# Patient Record
Sex: Male | Born: 1945
Health system: Southern US, Community
[De-identification: ages and names within clinical notes are randomized; demographics above are authoritative.]

## PROBLEM LIST (undated history)

## (undated) DIAGNOSIS — H409 Unspecified glaucoma: Secondary | ICD-10-CM

## (undated) DIAGNOSIS — G4730 Sleep apnea, unspecified: Secondary | ICD-10-CM

## (undated) DIAGNOSIS — J45909 Unspecified asthma, uncomplicated: Secondary | ICD-10-CM

## (undated) DIAGNOSIS — J449 Chronic obstructive pulmonary disease, unspecified: Secondary | ICD-10-CM

## (undated) DIAGNOSIS — J432 Centrilobular emphysema: Secondary | ICD-10-CM

## (undated) DIAGNOSIS — E785 Hyperlipidemia, unspecified: Secondary | ICD-10-CM

## (undated) DIAGNOSIS — K219 Gastro-esophageal reflux disease without esophagitis: Secondary | ICD-10-CM

## (undated) DIAGNOSIS — I10 Essential (primary) hypertension: Secondary | ICD-10-CM

## (undated) DIAGNOSIS — I2510 Atherosclerotic heart disease of native coronary artery without angina pectoris: Secondary | ICD-10-CM

## (undated) DIAGNOSIS — I519 Heart disease, unspecified: Secondary | ICD-10-CM

## (undated) DIAGNOSIS — G2581 Restless legs syndrome: Secondary | ICD-10-CM

## (undated) DIAGNOSIS — I7090 Unspecified atherosclerosis: Secondary | ICD-10-CM

## (undated) DIAGNOSIS — I219 Acute myocardial infarction, unspecified: Secondary | ICD-10-CM

## (undated) HISTORY — DX: Heart disease, unspecified: I51.9

## (undated) HISTORY — DX: Unspecified glaucoma: H40.9

## (undated) HISTORY — DX: Centrilobular emphysema: J43.2

## (undated) HISTORY — DX: Acute myocardial infarction, unspecified: I21.9

## (undated) HISTORY — DX: Unspecified atherosclerosis: I70.90

## (undated) HISTORY — PX: EYE SURGERY: SHX253

## (undated) HISTORY — PX: CATARACT EXTRACTION W/ INTRAOCULAR LENS  IMPLANT, BILATERAL: SHX1307

## (undated) HISTORY — DX: Unspecified asthma, uncomplicated: J45.909

## (undated) HISTORY — DX: Sleep apnea, unspecified: G47.30

## (undated) HISTORY — DX: Chronic obstructive pulmonary disease, unspecified: J44.9

## (undated) HISTORY — DX: Essential (primary) hypertension: I10

## (undated) HISTORY — DX: Hyperlipidemia, unspecified: E78.5

## (undated) HISTORY — DX: Restless legs syndrome: G25.81

## (undated) HISTORY — DX: Gastro-esophageal reflux disease without esophagitis: K21.9

---

## 2005-09-25 ENCOUNTER — Ambulatory Visit: Payer: Self-pay | Admitting: Ophthalmology

## 2005-09-25 ENCOUNTER — Other Ambulatory Visit: Payer: Self-pay

## 2006-02-12 ENCOUNTER — Ambulatory Visit: Payer: Self-pay | Admitting: Ophthalmology

## 2012-02-25 ENCOUNTER — Ambulatory Visit: Payer: Self-pay | Admitting: Ophthalmology

## 2014-10-24 DIAGNOSIS — J432 Centrilobular emphysema: Secondary | ICD-10-CM

## 2014-10-24 DIAGNOSIS — G2581 Restless legs syndrome: Secondary | ICD-10-CM

## 2014-10-24 DIAGNOSIS — K219 Gastro-esophageal reflux disease without esophagitis: Secondary | ICD-10-CM | POA: Insufficient documentation

## 2014-10-24 DIAGNOSIS — G4733 Obstructive sleep apnea (adult) (pediatric): Secondary | ICD-10-CM | POA: Insufficient documentation

## 2014-10-24 DIAGNOSIS — I7090 Unspecified atherosclerosis: Secondary | ICD-10-CM

## 2014-10-24 HISTORY — DX: Unspecified atherosclerosis: I70.90

## 2014-10-24 HISTORY — DX: Restless legs syndrome: G25.81

## 2014-10-24 HISTORY — DX: Centrilobular emphysema: J43.2

## 2015-01-14 ENCOUNTER — Other Ambulatory Visit
Admit: 2015-01-14 | Disposition: A | Discharge status: Home / Self Care | Payer: Self-pay | Attending: Family Medicine | Admitting: Family Medicine

## 2015-01-14 LAB — PROTIME-INR
INR: 0.9
Prothrombin Time: 12.7 secs

## 2015-10-24 DIAGNOSIS — Z79899 Other long term (current) drug therapy: Secondary | ICD-10-CM | POA: Diagnosis not present

## 2015-10-24 DIAGNOSIS — Z125 Encounter for screening for malignant neoplasm of prostate: Secondary | ICD-10-CM | POA: Diagnosis not present

## 2015-10-24 DIAGNOSIS — Z0000 Encounter for general adult medical examination without abnormal findings: Secondary | ICD-10-CM | POA: Diagnosis not present

## 2015-10-27 ENCOUNTER — Encounter: Payer: Self-pay | Admitting: Internal Medicine

## 2015-10-27 DIAGNOSIS — Z0000 Encounter for general adult medical examination without abnormal findings: Secondary | ICD-10-CM | POA: Diagnosis not present

## 2015-10-27 DIAGNOSIS — J431 Panlobular emphysema: Secondary | ICD-10-CM | POA: Diagnosis not present

## 2015-10-27 DIAGNOSIS — R319 Hematuria, unspecified: Secondary | ICD-10-CM | POA: Diagnosis not present

## 2015-11-06 ENCOUNTER — Ambulatory Visit (INDEPENDENT_AMBULATORY_CARE_PROVIDER_SITE_OTHER): Payer: PPO | Admitting: Urology

## 2015-11-06 ENCOUNTER — Encounter: Payer: Self-pay | Admitting: Urology

## 2015-11-06 VITALS — BP 190/91 | HR 94 | Ht 67.0 in | Wt 186.3 lb

## 2015-11-06 DIAGNOSIS — R3129 Other microscopic hematuria: Secondary | ICD-10-CM | POA: Diagnosis not present

## 2015-11-06 DIAGNOSIS — R972 Elevated prostate specific antigen [PSA]: Secondary | ICD-10-CM

## 2015-11-06 DIAGNOSIS — J449 Chronic obstructive pulmonary disease, unspecified: Secondary | ICD-10-CM | POA: Insufficient documentation

## 2015-11-06 HISTORY — DX: Chronic obstructive pulmonary disease, unspecified: J44.9

## 2015-11-06 LAB — MICROSCOPIC EXAMINATION: WBC, UA: NONE SEEN /hpf (ref 0–?)

## 2015-11-06 LAB — URINALYSIS, COMPLETE
Bilirubin, UA: NEGATIVE
Glucose, UA: NEGATIVE
KETONES UA: NEGATIVE
Leukocytes, UA: NEGATIVE
NITRITE UA: NEGATIVE
SPEC GRAV UA: 1.025 (ref 1.005–1.030)
Urobilinogen, Ur: 1 mg/dL (ref 0.2–1.0)
pH, UA: 5.5 (ref 5.0–7.5)

## 2015-11-06 NOTE — Progress Notes (Signed)
11/06/2015 11:35 AM   Nydia Bouton 1946/10/01 [id number]  Referring provider: No referring provider defined for this encounter.  Chief Complaint  Patient presents with  . Hematuria    New Patient    HPI:  70 year old white male referred by Dr. Emily Filbert, M.D. For evaluation and management of an elevated PSA.  in addition to the patient's elevated PSA, he is noted to have microscopic hematuria.     Over the past 6 months the patient denies any progression of his voiding symptoms. He denies any dysuria or gross hematuria.   The patient does not get up at night to void. He does have a history of a bladder neck incision based on his history although he have the notes from this. This was approximately 10 years ago and performed at [facility]. Since then the patient has had no issues with his voiding symptoms. His initial chief complaint 10 years ago was microscopic hematuria. The patient has no family history of prostate cancer.   The patient's past medical history is significant for severe CAD, he has collateral circulation but his lesions were  2 tight to be stented.   At this point, the patient has not been referred to a cardiac surgeon for bypass. He had a heart attack in 1994. He has not had any heart attacks since then. He is on Coumadin. The patient also has severe COPD. 6 years ago, he was placed on home oxygen , but at this point only wears it at night and as needed. He gets around quite well on his own.   PSA:   10/24/14 - 3.28  10/24/15-5.56  PMH: Past Medical History  Diagnosis Date  . GERD (gastroesophageal reflux disease)   . Asthma   . Glaucoma   . Heart attack ([place])   . Heart disease   . Hypertension   . Hyperlipemia   . Sleep apnea   . Arterial vascular disease 10/24/2014    Overview:  MI 1994, small-vessel disease requiring coumadin   . Centrilobular emphysema ([place]) 10/24/2014    Overview:  2L O2 prn   . Chronic obstructive pulmonary disease ([place])  11/06/2015  . Restless leg 10/24/2014    Surgical History: Past Surgical History  Procedure Laterality Date  . Cataract extraction w/ intraocular lens  implant, bilateral      Home Medications:    Medication List       This list is accurate as of: 11/06/15 11:35 AM.  Always use your most recent med list.               ADVAIR DISKUS 250-50 MCG/DOSE Aepb  Generic drug:  Fluticasone-Salmeterol  Inhale into the lungs.     atenolol 50 MG tablet  Commonly known as:  TENORMIN  TAKE 1 TABLET (50 MG TOTAL) BY MOUTH ONCE DAILY.     atorvastatin 80 MG tablet  Commonly known as:  LIPITOR  TAKE 1 TABLET (80 MG TOTAL) BY MOUTH ONCE DAILY.     fluticasone 50 MCG/ACT nasal spray  Commonly known as:  FLONASE  1 spray by Each Nare route daily.     gabapentin 300 MG capsule  Commonly known as:  NEURONTIN  Take 300 mg by mouth.     methocarbamol 500 MG tablet  Commonly known as:  ROBAXIN  Take 500 mg by mouth 3 (three) times daily as needed.     omeprazole 40 MG capsule  Commonly known as:  PRILOSEC  TAKE 1 CAPSULE (  40 MG TOTAL) BY MOUTH 2 (TWO) TIMES DAILY.     PROAIR HFA 108 (90 Base) MCG/ACT inhaler  Generic drug:  albuterol  Inhale into the lungs.     tiotropium 18 MCG inhalation capsule  Commonly known as:  [place] into inhaler and inhale.     TRAVATAN Z 0.004 % Soln ophthalmic solution  Generic drug:  Travoprost (BAK Free)  Frequency:PHARMDIR   Dosage:0.0     Instructions:  Note:1 gtt in each eye q pm Dose: 1     warfarin 5 MG tablet  Commonly known as:  COUMADIN  TAKE 5 MG TABLET EVERY OTHER DAY ALTERNATING WITH 2.5 MG        Allergies: No Known Allergies  Family History: Family History  Problem Relation Age of Onset  . Prostate cancer Neg Hx   . Bladder Cancer Neg Hx   . Kidney cancer Neg Hx   . Nephrolithiasis Father     Social History:  reports that he has quit smoking. He does not have any smokeless tobacco history on file. He reports that he  does not drink alcohol or use illicit drugs.  ROS: UROLOGY Frequent Urination?: No Hard to postpone urination?: No Burning/pain with urination?: No Get up at night to urinate?: Yes Leakage of urine?: No Urine stream starts and stops?: No Trouble starting stream?: No Do you have to strain to urinate?: No Blood in urine?: Yes Urinary tract infection?: No Sexually transmitted disease?: No Injury to kidneys or bladder?: No Painful intercourse?: No Weak stream?: No Erection problems?: No Penile pain?: No  Gastrointestinal Nausea?: No Vomiting?: No Indigestion/heartburn?: No Diarrhea?: Yes Constipation?: No  Constitutional Fever: No Night sweats?: No Weight loss?: No Fatigue?: Yes  Skin Skin rash/lesions?: No Itching?: No  Eyes Blurred vision?: No Double vision?: No  Ears/Nose/Throat Sore throat?: No Sinus problems?: Yes  Hematologic/Lymphatic Swollen glands?: No Easy bruising?: Yes  Cardiovascular Leg swelling?: No Chest pain?: No  Respiratory Cough?: Yes Shortness of breath?: Yes  Endocrine Excessive thirst?: No  Musculoskeletal Back pain?: Yes Joint pain?: No  Neurological Headaches?: No Dizziness?: No  Psychologic Depression?: No Anxiety?: No  Physical Exam: BP 190/91 mmHg  Pulse 94  Ht 5\' 7"  (1.702 m)  Wt 186 lb 4.8 oz (84.505 kg)  BMI 29.17 kg/m2  Constitutional:  Alert and oriented, No acute distress. HEENT: [place] AT, moist mucus membranes.  Trachea midline, no masses. Cardiovascular: No clubbing, cyanosis, or edema. Respiratory: Normal respiratory effort, no increased work of breathing. GI: Abdomen is soft, nontender, nondistended, no abdominal masses GU:  DRE normal, symmetric, no nodules, plus 2 in size Skin: No rashes, bruises or suspicious lesions. Lymph: No cervical or inguinal adenopathy. Neurologic: Grossly intact, no focal deficits, moving all 4 extremities. Psychiatric: Normal mood and affect.  Laboratory Data: No  results found for: WBC, HGB, HCT, MCV, PLT  No results found for: CREATININE  No results found for: PSA  No results found for: TESTOSTERONE  No results found for: HGBA1C  Urinalysis No results found for: COLORURINE, APPEARANCEUR, LABSPEC, PHURINE, GLUCOSEU, HGBUR, BILIRUBINUR, KETONESUR, PROTEINUR, UROBILINOGEN, NITRITE, LEUKOCYTESUR  Pertinent Imaging:   Assessment & Plan:   The patient has an elevated PSA.   He also has significant comorbidities with severe CAD and COPD. I went over the patient's PSA with him and explained the significance in great detail. I told him that he was much more likely to die from his other comorbidities stent from prostate cancer. However,  We should continue  to trend his PSA. We will plan to repeat it in 3 months. The patient's microscopic hematuria is chronic. He is otherwise asymptomatic.  Plan to repeat the patient's UA at his follow-up visit and discuss whether a complete evaluation is something that we will she is to pursue. Given his history of chronic microscopic hematuria and is history of bladder neck incision/ urethral dilation , I suspect his microscopic hematuria is of no clinical significance.  1. Microscopic hematuria  - Urinalysis, Complete - PSA, total and free   Return in about 3 months (around 02/04/2016) for with PSA prior to appointment.  Ardis Hughs, [facility] [address] [place], [place] [address] [phone]

## 2015-11-11 DIAGNOSIS — E669 Obesity, unspecified: Secondary | ICD-10-CM | POA: Diagnosis not present

## 2015-11-11 DIAGNOSIS — I2119 ST elevation (STEMI) myocardial infarction involving other coronary artery of inferior wall: Secondary | ICD-10-CM | POA: Diagnosis not present

## 2015-11-11 DIAGNOSIS — R079 Chest pain, unspecified: Secondary | ICD-10-CM | POA: Diagnosis not present

## 2015-11-11 DIAGNOSIS — I7789 Other specified disorders of arteries and arterioles: Secondary | ICD-10-CM | POA: Diagnosis not present

## 2015-11-11 DIAGNOSIS — Z7901 Long term (current) use of anticoagulants: Secondary | ICD-10-CM | POA: Diagnosis not present

## 2015-11-11 DIAGNOSIS — Z6828 Body mass index (BMI) 28.0-28.9, adult: Secondary | ICD-10-CM | POA: Diagnosis not present

## 2015-11-11 DIAGNOSIS — K219 Gastro-esophageal reflux disease without esophagitis: Secondary | ICD-10-CM | POA: Diagnosis not present

## 2015-11-11 DIAGNOSIS — I2510 Atherosclerotic heart disease of native coronary artery without angina pectoris: Secondary | ICD-10-CM | POA: Diagnosis not present

## 2015-11-11 DIAGNOSIS — I213 ST elevation (STEMI) myocardial infarction of unspecified site: Secondary | ICD-10-CM | POA: Diagnosis not present

## 2015-11-11 DIAGNOSIS — I2541 Coronary artery aneurysm: Secondary | ICD-10-CM | POA: Diagnosis not present

## 2015-11-11 DIAGNOSIS — E782 Mixed hyperlipidemia: Secondary | ICD-10-CM | POA: Diagnosis not present

## 2015-11-11 DIAGNOSIS — I10 Essential (primary) hypertension: Secondary | ICD-10-CM | POA: Diagnosis not present

## 2015-11-11 DIAGNOSIS — G4733 Obstructive sleep apnea (adult) (pediatric): Secondary | ICD-10-CM | POA: Diagnosis not present

## 2015-11-11 DIAGNOSIS — I252 Old myocardial infarction: Secondary | ICD-10-CM | POA: Diagnosis not present

## 2015-11-11 DIAGNOSIS — I2111 ST elevation (STEMI) myocardial infarction involving right coronary artery: Secondary | ICD-10-CM | POA: Diagnosis not present

## 2015-11-11 DIAGNOSIS — Z87891 Personal history of nicotine dependence: Secondary | ICD-10-CM | POA: Diagnosis not present

## 2015-11-11 DIAGNOSIS — J449 Chronic obstructive pulmonary disease, unspecified: Secondary | ICD-10-CM | POA: Diagnosis not present

## 2015-11-12 DIAGNOSIS — I7789 Other specified disorders of arteries and arterioles: Secondary | ICD-10-CM | POA: Diagnosis not present

## 2015-11-12 DIAGNOSIS — I2510 Atherosclerotic heart disease of native coronary artery without angina pectoris: Secondary | ICD-10-CM | POA: Diagnosis not present

## 2015-11-12 DIAGNOSIS — I2119 ST elevation (STEMI) myocardial infarction involving other coronary artery of inferior wall: Secondary | ICD-10-CM | POA: Diagnosis not present

## 2015-11-12 DIAGNOSIS — I2111 ST elevation (STEMI) myocardial infarction involving right coronary artery: Secondary | ICD-10-CM | POA: Diagnosis not present

## 2015-11-13 DIAGNOSIS — E782 Mixed hyperlipidemia: Secondary | ICD-10-CM | POA: Diagnosis not present

## 2015-11-13 DIAGNOSIS — I7789 Other specified disorders of arteries and arterioles: Secondary | ICD-10-CM | POA: Diagnosis not present

## 2015-11-13 DIAGNOSIS — I2119 ST elevation (STEMI) myocardial infarction involving other coronary artery of inferior wall: Secondary | ICD-10-CM | POA: Diagnosis not present

## 2015-11-14 DIAGNOSIS — I7789 Other specified disorders of arteries and arterioles: Secondary | ICD-10-CM | POA: Diagnosis not present

## 2015-11-14 DIAGNOSIS — I2119 ST elevation (STEMI) myocardial infarction involving other coronary artery of inferior wall: Secondary | ICD-10-CM | POA: Diagnosis not present

## 2015-11-14 DIAGNOSIS — E782 Mixed hyperlipidemia: Secondary | ICD-10-CM | POA: Diagnosis not present

## 2015-11-20 DIAGNOSIS — Z9989 Dependence on other enabling machines and devices: Secondary | ICD-10-CM | POA: Diagnosis not present

## 2015-11-20 DIAGNOSIS — G4733 Obstructive sleep apnea (adult) (pediatric): Secondary | ICD-10-CM | POA: Diagnosis not present

## 2015-11-20 DIAGNOSIS — I252 Old myocardial infarction: Secondary | ICD-10-CM | POA: Diagnosis not present

## 2015-11-20 DIAGNOSIS — I2119 ST elevation (STEMI) myocardial infarction involving other coronary artery of inferior wall: Secondary | ICD-10-CM | POA: Diagnosis not present

## 2015-11-20 DIAGNOSIS — I2510 Atherosclerotic heart disease of native coronary artery without angina pectoris: Secondary | ICD-10-CM | POA: Diagnosis not present

## 2015-11-20 DIAGNOSIS — J431 Panlobular emphysema: Secondary | ICD-10-CM | POA: Diagnosis not present

## 2015-11-21 DIAGNOSIS — I2119 ST elevation (STEMI) myocardial infarction involving other coronary artery of inferior wall: Secondary | ICD-10-CM | POA: Diagnosis not present

## 2015-11-22 DIAGNOSIS — F419 Anxiety disorder, unspecified: Secondary | ICD-10-CM | POA: Diagnosis not present

## 2015-12-26 DIAGNOSIS — J40 Bronchitis, not specified as acute or chronic: Secondary | ICD-10-CM | POA: Diagnosis not present

## 2016-02-02 DIAGNOSIS — J40 Bronchitis, not specified as acute or chronic: Secondary | ICD-10-CM | POA: Diagnosis not present

## 2016-02-05 ENCOUNTER — Other Ambulatory Visit: Payer: PPO

## 2016-02-05 DIAGNOSIS — R972 Elevated prostate specific antigen [PSA]: Secondary | ICD-10-CM

## 2016-02-06 LAB — PSA: PROSTATE SPECIFIC AG, SERUM: 3.6 ng/mL (ref 0.0–4.0)

## 2016-02-08 ENCOUNTER — Telehealth: Payer: Self-pay | Admitting: Radiology

## 2016-02-08 NOTE — Telephone Encounter (Signed)
Pt's wife called about pt's PSA level drawn 02/05/16. Advised her that level was 3.6 which is WNL. Pt doesn't have a f/u appt scheduled. Would you like an appt to be scheduled? If so, when?

## 2016-02-20 ENCOUNTER — Encounter: Payer: Self-pay | Admitting: Urology

## 2016-02-20 ENCOUNTER — Ambulatory Visit (INDEPENDENT_AMBULATORY_CARE_PROVIDER_SITE_OTHER): Payer: PPO | Admitting: Urology

## 2016-02-20 VITALS — BP 105/65 | HR 61 | Ht 66.0 in | Wt 183.6 lb

## 2016-02-20 DIAGNOSIS — R3129 Other microscopic hematuria: Secondary | ICD-10-CM | POA: Diagnosis not present

## 2016-02-20 DIAGNOSIS — I2510 Atherosclerotic heart disease of native coronary artery without angina pectoris: Secondary | ICD-10-CM | POA: Diagnosis not present

## 2016-02-20 DIAGNOSIS — J432 Centrilobular emphysema: Secondary | ICD-10-CM | POA: Diagnosis not present

## 2016-02-20 DIAGNOSIS — Z9989 Dependence on other enabling machines and devices: Secondary | ICD-10-CM | POA: Diagnosis not present

## 2016-02-20 DIAGNOSIS — I7789 Other specified disorders of arteries and arterioles: Secondary | ICD-10-CM | POA: Diagnosis not present

## 2016-02-20 DIAGNOSIS — G4733 Obstructive sleep apnea (adult) (pediatric): Secondary | ICD-10-CM | POA: Diagnosis not present

## 2016-02-20 DIAGNOSIS — I2119 ST elevation (STEMI) myocardial infarction involving other coronary artery of inferior wall: Secondary | ICD-10-CM | POA: Diagnosis not present

## 2016-02-20 LAB — URINALYSIS, COMPLETE
BILIRUBIN UA: NEGATIVE
GLUCOSE, UA: NEGATIVE
Ketones, UA: NEGATIVE
LEUKOCYTES UA: NEGATIVE
Nitrite, UA: NEGATIVE
PH UA: 7 (ref 5.0–7.5)
Specific Gravity, UA: 1.02 (ref 1.005–1.030)
UUROB: 0.2 mg/dL (ref 0.2–1.0)

## 2016-02-20 LAB — MICROSCOPIC EXAMINATION

## 2016-02-20 NOTE — Progress Notes (Signed)
70 year old male seen today in follow-up for an elevated PSA and microscopic hematuria. When the patient was last seen in January 2017 his PSA was 5.56. At that point we opted to repeat the PSA in 3 months. Since that time the patient has had a severe heart attack and undergone coronary artery stenting. Since the patient was last seen he denies any progression of his voiding symptoms. He denies any dysuria or gross hematuria. The patient's PSA today is 3.6, back to his baseline of between 3.2 and 3.5.  The patient continues to have microscopic hematuria. He is a former smoker, 25-pack-year history, quit was 20 years ago. He has no occupational exposure to carcinogenic agents. He has no family history of GU malignancies. His no history of kidney stones or recurrent urinary tract infections.  Current Outpatient Prescriptions on File Prior to Visit  Medication Sig Dispense Refill  . albuterol (PROAIR HFA) 108 (90 Base) MCG/ACT inhaler Inhale into the lungs.    Marland Kitchen atenolol (TENORMIN) 50 MG tablet TAKE 1 TABLET (50 MG TOTAL) BY MOUTH ONCE DAILY.  1  . atorvastatin (LIPITOR) 80 MG tablet TAKE 1 TABLET (80 MG TOTAL) BY MOUTH ONCE DAILY.  1  . fluticasone (FLONASE) 50 MCG/ACT nasal spray 1 spray by Each Nare route daily.    . Fluticasone-Salmeterol (ADVAIR DISKUS) 250-50 MCG/DOSE AEPB Inhale into the lungs.    . gabapentin (NEURONTIN) 300 MG capsule Take 300 mg by mouth.    . methocarbamol (ROBAXIN) 500 MG tablet Take 500 mg by mouth 3 (three) times daily as needed.  1  . omeprazole (PRILOSEC) 40 MG capsule TAKE 1 CAPSULE (40 MG TOTAL) BY MOUTH 2 (TWO) TIMES DAILY.  3  . tiotropium (SPIRIVA) 18 MCG inhalation capsule Place into inhaler and inhale.    . Travoprost, BAK Free, (TRAVATAN Z) 0.004 % SOLN ophthalmic solution Frequency:PHARMDIR   Dosage:0.0     Instructions:  Note:1 gtt in each eye q pm Dose: 1     No current facility-administered medications on file prior to visit.   Past Medical History   Diagnosis Date  . GERD (gastroesophageal reflux disease)   . Asthma   . Glaucoma   . Heart attack ([place])   . Heart disease   . Hypertension   . Hyperlipemia   . Sleep apnea   . Arterial vascular disease 10/24/2014    Overview:  MI 1994, small-vessel disease requiring coumadin   . Centrilobular emphysema ([place]) 10/24/2014    Overview:  2L O2 prn   . Chronic obstructive pulmonary disease ([place]) 11/06/2015  . Restless leg 10/24/2014   Filed Vitals:   02/20/16 1108  BP: 105/65  Pulse: 61   NAD Urinalysis today: This demonstrates microscopic hematuria without evidence of infection  Impression: The patient has asymptomatic microscopic hematuria. He had does have a history of microscopic hematuria 10 years prior. He is also undergone a TUIP roughly 10 years ago. He has not been worked up her evaluated since then. Given all that he has been through and his other health issues having it be prudent to undergo a asymptomatic microscopic hematuria evaluation gluteus CT scan and cystoscopy. As her Lasix the patient's PSA, his PSA has returned back to its baseline, and given his other comorbidities does not need any additional workup or evaluation. At this point, recommend that we stop checking his PSAs entirely.   The patient will follow-up after CT scan for cystoscopy.

## 2016-02-26 ENCOUNTER — Telehealth: Payer: Self-pay | Admitting: Urology

## 2016-02-26 NOTE — Telephone Encounter (Signed)
Pt called to cancel his CT results appt.  He decided not to have his CT done.  Just F.Y.I.

## 2016-02-29 DIAGNOSIS — H401131 Primary open-angle glaucoma, bilateral, mild stage: Secondary | ICD-10-CM | POA: Diagnosis not present

## 2016-03-05 ENCOUNTER — Ambulatory Visit: Payer: PPO

## 2016-03-26 ENCOUNTER — Ambulatory Visit: Payer: PPO

## 2016-04-22 DIAGNOSIS — R319 Hematuria, unspecified: Secondary | ICD-10-CM | POA: Diagnosis not present

## 2016-04-22 DIAGNOSIS — I2119 ST elevation (STEMI) myocardial infarction involving other coronary artery of inferior wall: Secondary | ICD-10-CM | POA: Diagnosis not present

## 2016-04-30 DIAGNOSIS — J432 Centrilobular emphysema: Secondary | ICD-10-CM | POA: Diagnosis not present

## 2016-04-30 DIAGNOSIS — I2119 ST elevation (STEMI) myocardial infarction involving other coronary artery of inferior wall: Secondary | ICD-10-CM | POA: Diagnosis not present

## 2016-04-30 DIAGNOSIS — I7789 Other specified disorders of arteries and arterioles: Secondary | ICD-10-CM | POA: Diagnosis not present

## 2016-06-05 DIAGNOSIS — K219 Gastro-esophageal reflux disease without esophagitis: Secondary | ICD-10-CM | POA: Diagnosis not present

## 2016-06-05 DIAGNOSIS — I2510 Atherosclerotic heart disease of native coronary artery without angina pectoris: Secondary | ICD-10-CM | POA: Diagnosis not present

## 2016-06-05 DIAGNOSIS — I2119 ST elevation (STEMI) myocardial infarction involving other coronary artery of inferior wall: Secondary | ICD-10-CM | POA: Diagnosis not present

## 2016-07-05 DIAGNOSIS — Z23 Encounter for immunization: Secondary | ICD-10-CM | POA: Diagnosis not present

## 2016-07-05 DIAGNOSIS — Z709 Sex counseling, unspecified: Secondary | ICD-10-CM | POA: Diagnosis not present

## 2016-07-05 DIAGNOSIS — R5382 Chronic fatigue, unspecified: Secondary | ICD-10-CM | POA: Diagnosis not present

## 2016-08-06 DIAGNOSIS — F321 Major depressive disorder, single episode, moderate: Secondary | ICD-10-CM | POA: Diagnosis not present

## 2016-08-06 DIAGNOSIS — F5105 Insomnia due to other mental disorder: Secondary | ICD-10-CM | POA: Diagnosis not present

## 2016-08-06 DIAGNOSIS — F1021 Alcohol dependence, in remission: Secondary | ICD-10-CM | POA: Diagnosis not present

## 2016-08-06 DIAGNOSIS — F429 Obsessive-compulsive disorder, unspecified: Secondary | ICD-10-CM | POA: Diagnosis not present

## 2016-08-16 DIAGNOSIS — F5105 Insomnia due to other mental disorder: Secondary | ICD-10-CM | POA: Diagnosis not present

## 2016-08-16 DIAGNOSIS — F429 Obsessive-compulsive disorder, unspecified: Secondary | ICD-10-CM | POA: Diagnosis not present

## 2016-08-16 DIAGNOSIS — F1021 Alcohol dependence, in remission: Secondary | ICD-10-CM | POA: Diagnosis not present

## 2016-08-16 DIAGNOSIS — F321 Major depressive disorder, single episode, moderate: Secondary | ICD-10-CM | POA: Diagnosis not present

## 2016-08-22 DIAGNOSIS — F429 Obsessive-compulsive disorder, unspecified: Secondary | ICD-10-CM | POA: Diagnosis not present

## 2016-08-27 DIAGNOSIS — H401131 Primary open-angle glaucoma, bilateral, mild stage: Secondary | ICD-10-CM | POA: Diagnosis not present

## 2016-09-03 DIAGNOSIS — H401131 Primary open-angle glaucoma, bilateral, mild stage: Secondary | ICD-10-CM | POA: Diagnosis not present

## 2016-09-09 DIAGNOSIS — F1021 Alcohol dependence, in remission: Secondary | ICD-10-CM | POA: Diagnosis not present

## 2016-09-09 DIAGNOSIS — F429 Obsessive-compulsive disorder, unspecified: Secondary | ICD-10-CM | POA: Diagnosis not present

## 2016-09-09 DIAGNOSIS — F321 Major depressive disorder, single episode, moderate: Secondary | ICD-10-CM | POA: Diagnosis not present

## 2016-09-09 DIAGNOSIS — F5105 Insomnia due to other mental disorder: Secondary | ICD-10-CM | POA: Diagnosis not present

## 2016-09-12 DIAGNOSIS — F429 Obsessive-compulsive disorder, unspecified: Secondary | ICD-10-CM | POA: Diagnosis not present

## 2016-09-17 DIAGNOSIS — H26491 Other secondary cataract, right eye: Secondary | ICD-10-CM | POA: Diagnosis not present

## 2016-09-23 ENCOUNTER — Inpatient Hospital Stay
Admission: EM | Admit: 2016-09-23 | Discharge: 2016-10-06 | DRG: 885 | Disposition: A | Discharge status: Home / Self Care | Payer: PPO | Source: Intra-hospital | Attending: Psychiatry | Admitting: Psychiatry

## 2016-09-23 ENCOUNTER — Encounter: Payer: Self-pay | Admitting: Emergency Medicine

## 2016-09-23 ENCOUNTER — Emergency Department
Admission: EM | Admit: 2016-09-23 | Discharge: 2016-09-23 | Disposition: A | Discharge status: Other Acute Inpatient Hospital | Payer: PPO | Attending: Emergency Medicine | Admitting: Emergency Medicine

## 2016-09-23 DIAGNOSIS — J45909 Unspecified asthma, uncomplicated: Secondary | ICD-10-CM | POA: Insufficient documentation

## 2016-09-23 DIAGNOSIS — J449 Chronic obstructive pulmonary disease, unspecified: Secondary | ICD-10-CM | POA: Diagnosis present

## 2016-09-23 DIAGNOSIS — K219 Gastro-esophageal reflux disease without esophagitis: Secondary | ICD-10-CM | POA: Diagnosis not present

## 2016-09-23 DIAGNOSIS — F429 Obsessive-compulsive disorder, unspecified: Secondary | ICD-10-CM | POA: Diagnosis not present

## 2016-09-23 DIAGNOSIS — Z79899 Other long term (current) drug therapy: Secondary | ICD-10-CM | POA: Insufficient documentation

## 2016-09-23 DIAGNOSIS — Z87891 Personal history of nicotine dependence: Secondary | ICD-10-CM | POA: Insufficient documentation

## 2016-09-23 DIAGNOSIS — E785 Hyperlipidemia, unspecified: Secondary | ICD-10-CM | POA: Diagnosis present

## 2016-09-23 DIAGNOSIS — G4733 Obstructive sleep apnea (adult) (pediatric): Secondary | ICD-10-CM | POA: Diagnosis not present

## 2016-09-23 DIAGNOSIS — G4700 Insomnia, unspecified: Secondary | ICD-10-CM | POA: Diagnosis present

## 2016-09-23 DIAGNOSIS — R45851 Suicidal ideations: Secondary | ICD-10-CM | POA: Diagnosis present

## 2016-09-23 DIAGNOSIS — F5105 Insomnia due to other mental disorder: Secondary | ICD-10-CM | POA: Diagnosis not present

## 2016-09-23 DIAGNOSIS — F312 Bipolar disorder, current episode manic severe with psychotic features: Secondary | ICD-10-CM | POA: Diagnosis not present

## 2016-09-23 DIAGNOSIS — H409 Unspecified glaucoma: Secondary | ICD-10-CM | POA: Diagnosis not present

## 2016-09-23 DIAGNOSIS — F29 Unspecified psychosis not due to a substance or known physiological condition: Secondary | ICD-10-CM

## 2016-09-23 DIAGNOSIS — G2581 Restless legs syndrome: Secondary | ICD-10-CM | POA: Diagnosis present

## 2016-09-23 DIAGNOSIS — E538 Deficiency of other specified B group vitamins: Secondary | ICD-10-CM | POA: Diagnosis present

## 2016-09-23 DIAGNOSIS — I10 Essential (primary) hypertension: Secondary | ICD-10-CM | POA: Diagnosis present

## 2016-09-23 DIAGNOSIS — G4730 Sleep apnea, unspecified: Secondary | ICD-10-CM | POA: Diagnosis present

## 2016-09-23 DIAGNOSIS — I2510 Atherosclerotic heart disease of native coronary artery without angina pectoris: Secondary | ICD-10-CM

## 2016-09-23 DIAGNOSIS — I252 Old myocardial infarction: Secondary | ICD-10-CM

## 2016-09-23 DIAGNOSIS — F1021 Alcohol dependence, in remission: Secondary | ICD-10-CM | POA: Diagnosis not present

## 2016-09-23 DIAGNOSIS — F309 Manic episode, unspecified: Secondary | ICD-10-CM | POA: Diagnosis not present

## 2016-09-23 DIAGNOSIS — R4587 Impulsiveness: Secondary | ICD-10-CM | POA: Diagnosis not present

## 2016-09-23 DIAGNOSIS — F39 Unspecified mood [affective] disorder: Secondary | ICD-10-CM | POA: Diagnosis not present

## 2016-09-23 DIAGNOSIS — Z01810 Encounter for preprocedural cardiovascular examination: Secondary | ICD-10-CM | POA: Diagnosis not present

## 2016-09-23 DIAGNOSIS — I679 Cerebrovascular disease, unspecified: Secondary | ICD-10-CM | POA: Diagnosis not present

## 2016-09-23 LAB — COMPREHENSIVE METABOLIC PANEL
ALBUMIN: 4.3 g/dL (ref 3.5–5.0)
ALT: 20 U/L (ref 17–63)
AST: 32 U/L (ref 15–41)
Alkaline Phosphatase: 82 U/L (ref 38–126)
Anion gap: 7 (ref 5–15)
BUN: 34 mg/dL — ABNORMAL HIGH (ref 6–20)
CALCIUM: 9.1 mg/dL (ref 8.9–10.3)
CO2: 22 mmol/L (ref 22–32)
Chloride: 107 mmol/L (ref 101–111)
Creatinine, Ser: 1.28 mg/dL — ABNORMAL HIGH (ref 0.61–1.24)
GFR calc Af Amer: >60 mL/min (ref >60)
GFR calc non Af Amer: 56 mL/min — ABNORMAL LOW (ref >60)
GLUCOSE: 127 mg/dL — AB (ref 65–99)
Potassium: 4.2 mmol/L (ref 3.5–5.1)
SODIUM: 136 mmol/L (ref 135–145)
TOTAL PROTEIN: 7.9 g/dL (ref 6.5–8.1)
Total Bilirubin: 0.8 mg/dL (ref 0.3–1.2)

## 2016-09-23 LAB — CBC
HEMATOCRIT: 47.1 % (ref 40.0–52.0)
Hemoglobin: 15.5 g/dL (ref 13.0–18.0)
MCH: 28.6 pg (ref 26.0–34.0)
MCHC: 33 g/dL (ref 32.0–36.0)
MCV: 86.8 fL (ref 80.0–100.0)
Platelets: 219 10*3/uL (ref 150–440)
RBC: 5.43 MIL/uL (ref 4.40–5.90)
RDW: 14.3 % (ref 11.5–14.5)
WBC: 14.2 10*3/uL — ABNORMAL HIGH (ref 3.8–10.6)

## 2016-09-23 LAB — URINE DRUG SCREEN, QUALITATIVE (ARMC ONLY)
Amphetamines, Ur Screen: NOT DETECTED
Barbiturates, Ur Screen: NOT DETECTED
Benzodiazepine, Ur Scrn: NOT DETECTED
Cannabinoid 50 Ng, Ur ~~LOC~~: NOT DETECTED
Cocaine Metabolite,Ur ~~LOC~~: NOT DETECTED
MDMA (Ecstasy)Ur Screen: NOT DETECTED
METHADONE SCREEN, URINE: NOT DETECTED
OPIATE, UR SCREEN: NOT DETECTED
Phencyclidine (PCP) Ur S: NOT DETECTED
Tricyclic, Ur Screen: NOT DETECTED

## 2016-09-23 LAB — SALICYLATE LEVEL

## 2016-09-23 LAB — ETHANOL: Alcohol, Ethyl (B): <5 mg/dL (ref <5)

## 2016-09-23 LAB — ACETAMINOPHEN LEVEL: Acetaminophen (Tylenol), Serum: <10 ug/mL — ABNORMAL LOW (ref 10–30)

## 2016-09-23 MED ORDER — ALBUTEROL SULFATE HFA 108 (90 BASE) MCG/ACT IN AERS: 2.0000 | INHALATION_SPRAY | RESPIRATORY_TRACT | Status: DC | PRN

## 2016-09-23 MED ORDER — TICAGRELOR 90 MG PO TABS
90.0000 mg | ORAL_TABLET | Freq: Two times a day (BID) | ORAL | Status: DC
  Administered 2016-09-23: 90 mg via ORAL
  Filled 2016-09-23: qty 1

## 2016-09-23 MED ORDER — ATORVASTATIN CALCIUM 20 MG PO TABS
80.0000 mg | ORAL_TABLET | Freq: Every day | ORAL | Status: DC
  Administered 2016-09-24 – 2016-10-05 (×11): 80 mg via ORAL
  Filled 2016-09-23 (×2): qty 4
  Filled 2016-09-23: qty 3
  Filled 2016-09-23 (×11): qty 4

## 2016-09-23 MED ORDER — ACETAMINOPHEN 325 MG PO TABS: 650.0000 mg | ORAL_TABLET | Freq: Four times a day (QID) | ORAL | Status: DC | PRN

## 2016-09-23 MED ORDER — GABAPENTIN 300 MG PO CAPS
300.0000 mg | ORAL_CAPSULE | Freq: Every day | ORAL | Status: DC
  Administered 2016-09-24 – 2016-09-25 (×2): 300 mg via ORAL
  Filled 2016-09-23 (×2): qty 1

## 2016-09-23 MED ORDER — ATENOLOL 50 MG PO TABS
50.0000 mg | ORAL_TABLET | Freq: Every day | ORAL | Status: DC
  Administered 2016-09-24 – 2016-10-06 (×11): 50 mg via ORAL
  Filled 2016-09-23 (×13): qty 1

## 2016-09-23 MED ORDER — LISINOPRIL 5 MG PO TABS
5.0000 mg | ORAL_TABLET | Freq: Every day | ORAL | Status: DC
  Filled 2016-09-23: qty 1

## 2016-09-23 MED ORDER — ASPIRIN EC 81 MG PO TBEC
81.0000 mg | DELAYED_RELEASE_TABLET | Freq: Every day | ORAL | Status: DC
  Administered 2016-09-24 – 2016-10-06 (×13): 81 mg via ORAL
  Filled 2016-09-23 (×13): qty 1

## 2016-09-23 MED ORDER — OLANZAPINE 10 MG PO TABS
10.0000 mg | ORAL_TABLET | Freq: Every day | ORAL | Status: DC
  Administered 2016-09-23: 10 mg via ORAL
  Filled 2016-09-23: qty 1

## 2016-09-23 MED ORDER — MAGNESIUM HYDROXIDE 400 MG/5ML PO SUSP: 30.0000 mL | Freq: Every day | ORAL | Status: DC | PRN

## 2016-09-23 MED ORDER — TICAGRELOR 90 MG PO TABS
90.0000 mg | ORAL_TABLET | Freq: Two times a day (BID) | ORAL | Status: DC
  Administered 2016-09-24 – 2016-10-06 (×25): 90 mg via ORAL
  Filled 2016-09-23 (×26): qty 1

## 2016-09-23 MED ORDER — ATENOLOL 25 MG PO TABS
50.0000 mg | ORAL_TABLET | Freq: Every day | ORAL | Status: DC
Start: 2016-09-23 — End: 2016-09-23
  Filled 2016-09-23: qty 2

## 2016-09-23 MED ORDER — PANTOPRAZOLE SODIUM 40 MG PO TBEC
40.0000 mg | DELAYED_RELEASE_TABLET | Freq: Every day | ORAL | Status: DC
  Administered 2016-09-24 – 2016-10-06 (×13): 40 mg via ORAL
  Filled 2016-09-23 (×13): qty 1

## 2016-09-23 MED ORDER — LISINOPRIL 10 MG PO TABS
5.0000 mg | ORAL_TABLET | Freq: Every day | ORAL | Status: DC
  Administered 2016-09-24 – 2016-10-06 (×11): 5 mg via ORAL
  Filled 2016-09-23 (×13): qty 1

## 2016-09-23 MED ORDER — PANTOPRAZOLE SODIUM 40 MG PO TBEC
40.0000 mg | DELAYED_RELEASE_TABLET | Freq: Every day | ORAL | Status: DC
  Filled 2016-09-23: qty 1

## 2016-09-23 MED ORDER — GABAPENTIN 300 MG PO CAPS
300.0000 mg | ORAL_CAPSULE | Freq: Every day | ORAL | Status: DC
  Administered 2016-09-23: 300 mg via ORAL
  Filled 2016-09-23: qty 1

## 2016-09-23 MED ORDER — ALUM & MAG HYDROXIDE-SIMETH 200-200-20 MG/5ML PO SUSP: 30.0000 mL | ORAL | Status: DC | PRN

## 2016-09-23 MED ORDER — MOMETASONE FURO-FORMOTEROL FUM 200-5 MCG/ACT IN AERO
2.0000 | INHALATION_SPRAY | Freq: Two times a day (BID) | RESPIRATORY_TRACT | Status: DC
  Administered 2016-09-24 – 2016-10-06 (×24): 2 via RESPIRATORY_TRACT
  Filled 2016-09-23: qty 8.8

## 2016-09-23 MED ORDER — MOMETASONE FURO-FORMOTEROL FUM 200-5 MCG/ACT IN AERO: 2.0000 | INHALATION_SPRAY | Freq: Two times a day (BID) | RESPIRATORY_TRACT | Status: DC

## 2016-09-23 MED ORDER — ATORVASTATIN CALCIUM 20 MG PO TABS
80.0000 mg | ORAL_TABLET | Freq: Every day | ORAL | Status: DC
  Administered 2016-09-23: 80 mg via ORAL
  Filled 2016-09-23: qty 4

## 2016-09-23 MED ORDER — OLANZAPINE 10 MG PO TABS
10.0000 mg | ORAL_TABLET | Freq: Every day | ORAL | Status: DC
  Administered 2016-09-24 – 2016-10-05 (×12): 10 mg via ORAL
  Filled 2016-09-23 (×11): qty 1

## 2016-09-23 MED ORDER — ASPIRIN EC 81 MG PO TBEC
81.0000 mg | DELAYED_RELEASE_TABLET | Freq: Every day | ORAL | Status: DC
  Filled 2016-09-23: qty 1

## 2016-09-23 MED ORDER — OLANZAPINE 5 MG PO TABS
5.0000 mg | ORAL_TABLET | Freq: Once | ORAL | Status: AC
  Administered 2016-09-23: 5 mg via ORAL
  Filled 2016-09-23: qty 1

## 2016-09-23 NOTE — ED Notes (Signed)
Pt states "I am definatly not going to kill myself, I remember I read in the bible if you kill yourself you will not go to heaven", pt declined TV on or something to drink, resting on side of bed

## 2016-09-23 NOTE — ED Provider Notes (Signed)
[facility] Provider Note   CSN: [id number] Arrival date & time: 09/23/16  1309     History   Chief Complaint Chief Complaint  Patient presents with  . Manic Behavior    HPI Bradley Bowers is a 70 y.o. male hx of COPD, GERD, Depression here presenting with mania, hallucinations. Patient has been depressed for the last several weeks. Patient recently had a court case against him regarding child pornography. He has been depressed over this. He saw psychiatry last week and his zoloft was increased to 100 mg from 50 mg. He has been hallucinating since then. Patient told me that he is going to heaven. He denies any particular plans to kill himself. He said that he has been eating and sleeping well but he told the psychiatrist he has not been sleeping much.   The history is provided by the patient.    Past Medical History:  Diagnosis Date  . Arterial vascular disease 10/24/2014   Overview:  MI 1994, small-vessel disease requiring coumadin   . Asthma   . Centrilobular emphysema ([place]) 10/24/2014   Overview:  2L O2 prn   . Chronic obstructive pulmonary disease ([place]) 11/06/2015  . GERD (gastroesophageal reflux disease)   . Glaucoma   . Heart attack   . Heart disease   . Hyperlipemia   . Hypertension   . Restless leg 10/24/2014  . Sleep apnea     Patient Active Problem List   Diagnosis Date Noted  . Acute inferior myocardial infarction ([place]) 11/12/2015  . Coronary artery ectasia ([place]) 11/12/2015  . Chronic obstructive pulmonary disease ([place]) 11/06/2015  . Arterial vascular disease 10/24/2014  . Centrilobular emphysema ([place]) 10/24/2014  . Gastro-esophageal reflux disease without esophagitis 10/24/2014  . Obstructive apnea 10/24/2014  . Restless leg 10/24/2014    Past Surgical History:  Procedure Laterality Date  . CATARACT EXTRACTION W/ INTRAOCULAR LENS  IMPLANT, BILATERAL         Home Medications    Prior to Admission medications   Medication Sig  Start Date End Date Taking? Authorizing Provider  albuterol (PROAIR HFA) 108 (90 Base) MCG/ACT inhaler Inhale into the lungs.    Historical Provider, MD  aspirin 81 MG EC tablet TAKE 1 TABLET (81 MG TOTAL) BY MOUTH ONCE DAILY. 11/14/15   Historical Provider, MD  atenolol (TENORMIN) 50 MG tablet TAKE 1 TABLET (50 MG TOTAL) BY MOUTH ONCE DAILY. 09/22/15   Historical Provider, MD  atorvastatin (LIPITOR) 80 MG tablet TAKE 1 TABLET (80 MG TOTAL) BY MOUTH ONCE DAILY. 09/22/15   Historical Provider, MD  Calcium-Magnesium-Vitamin D (CALCIUM+D3 GRADUAL RELEASE) 600-40-500 MG-MG-UNIT TB24 Take by mouth. 03/18/12   Historical Provider, MD  fluticasone (FLONASE) 50 MCG/ACT nasal spray 1 spray by Each Nare route daily.    Historical Provider, MD  Fluticasone-Salmeterol (ADVAIR DISKUS) 250-50 MCG/DOSE AEPB Inhale into the lungs.    Historical Provider, MD  gabapentin (NEURONTIN) 300 MG capsule Take 300 mg by mouth. 12/06/09   Historical Provider, MD  lisinopril (PRINIVIL,ZESTRIL) 5 MG tablet TAKE 1 TABLET (5 MG TOTAL) BY MOUTH ONCE DAILY. 02/04/16   Historical Provider, MD  methocarbamol (ROBAXIN) 500 MG tablet Take 500 mg by mouth 3 (three) times daily as needed. 08/01/15   Historical Provider, MD  omeprazole (PRILOSEC) 40 MG capsule TAKE 1 CAPSULE (40 MG TOTAL) BY MOUTH 2 (TWO) TIMES DAILY. 10/19/15   Historical Provider, MD  ticagrelor (BRILINTA) 90 MG TABS tablet Take by mouth. 11/20/15   Historical Provider, MD  timolol (  TIMOPTIC) 0.5 % ophthalmic solution USE 1 DROP(S) IN BOTH EYES ONCE A DAY 02/04/16   Historical Provider, MD  tiotropium (SPIRIVA) 18 MCG inhalation capsule Place into inhaler and inhale.    Historical Provider, MD  Travoprost, BAK Free, (TRAVATAN Z) 0.004 % SOLN ophthalmic solution Frequency:PHARMDIR   Dosage:0.0     Instructions:  Note:1 gtt in each eye q pm Dose: 1 05/13/12   Historical Provider, MD    Family History Family History  Problem Relation Age of Onset  . Nephrolithiasis Father   .  Prostate cancer Neg Hx   . Bladder Cancer Neg Hx   . Kidney cancer Neg Hx     Social History Social History  Substance Use Topics  . Smoking status: Former Research scientist (life sciences)  . Smokeless tobacco: Never Used  . Alcohol use No     Allergies   Patient has no known allergies.   Review of Systems Review of Systems  Psychiatric/Behavioral: Positive for dysphoric mood and hallucinations.  All other systems reviewed and are negative.    Physical Exam Updated Vital Signs BP (!) 184/62 (BP Location: Left Arm)   Pulse 83   Temp 97.7 F (36.5 C) (Oral)   Resp (!) 22   Ht 5\' 5"  (1.651 m)   Wt 172 lb (78 kg) Comment: Simultaneous filing. User may not have seen previous data.  SpO2 95%   BMI 28.62 kg/m   Physical Exam  Constitutional:  Hallucinating, slightly agitated   HENT:  Head: Normocephalic.  Eyes: EOM are normal. Pupils are equal, round, and reactive to light.  Neck: Normal range of motion.  Cardiovascular: Normal rate.   Pulmonary/Chest: Effort normal and breath sounds normal. No respiratory distress. He has no wheezes.  Abdominal: Soft. Bowel sounds are normal. He exhibits no distension. There is no tenderness. There is no guarding.  Musculoskeletal: Normal range of motion.  Neurological: He is alert.  Skin: Skin is warm.  Psychiatric:  Hallucinating. Depressed   Nursing note and vitals reviewed.    ED Treatments / Results  Labs (all labs ordered are listed, but only abnormal results are displayed) Labs Reviewed  COMPREHENSIVE METABOLIC PANEL - Abnormal; Notable for the following:       Result Value   Glucose, Bld 127 (*)    BUN 34 (*)    Creatinine, Ser 1.28 (*)    GFR calc non Af Amer 56 (*)    All other components within normal limits  ACETAMINOPHEN LEVEL - Abnormal; Notable for the following:    Acetaminophen (Tylenol), Serum <10 (*)    All other components within normal limits  CBC - Abnormal; Notable for the following:    WBC 14.2 (*)    All other  components within normal limits  ETHANOL  SALICYLATE LEVEL  URINE DRUG SCREEN, QUALITATIVE ([facility] ONLY)    EKG  EKG Interpretation None       Radiology No results found.  Procedures Procedures (including critical care time)  Medications Ordered in ED Medications  OLANZapine (ZYPREXA) tablet 5 mg (not administered)     Initial Impression / Assessment and Plan / ED Course  I have reviewed the triage vital signs and the nursing notes.  Pertinent labs & imaging results that were available during my care of the patient were reviewed by me and considered in my medical decision making (see chart for details).  Clinical Course    Bradley Bowers is a 70 y.o. male here with hallucinations, depression. I talked to Dr.  [name], his psychiatrist. She was concerned for mania. He has hx of depression and not bipolar. She thinks likely from zoloft. She recommend labs, psych consult for admission, zyprexa.   2:51 PM Labs at baseline. Has hx of CAD but has no chest pain. Started on home meds. Dr. Weber Cooks to see patient.    Final Clinical Impressions(s) / ED Diagnoses   Final diagnoses:  None    New Prescriptions New Prescriptions   No medications on file     Drenda Freeze, MD 09/23/16 1451

## 2016-09-23 NOTE — ED Notes (Signed)
IVC/ Consult completed/ Plan to admit  

## 2016-09-23 NOTE — ED Notes (Signed)
Roomed pt to room 24.

## 2016-09-23 NOTE — ED Notes (Signed)
Pt given dinner tray.

## 2016-09-23 NOTE — ED Notes (Signed)
Dr.Clapacs at bedside  

## 2016-09-23 NOTE — Consult Note (Signed)
[facility] Consult   Reason for Consult:  Consult for 70 year old man with a history of recent depression now presenting with mania and psychosis Referring Physician:  [place] Patient Identification: Bradley Bowers MRN:  [id number] Principal Diagnosis: Bipolar affective disorder, current episode manic with psychotic symptoms ([place]) Diagnosis:   Patient Active Problem List   Diagnosis Date Noted  . Bipolar affective disorder, current episode manic with psychotic symptoms ([place]) [F31.2] 09/23/2016  . Acute inferior myocardial infarction ([place]) [I21.19] 11/12/2015  . Coronary artery ectasia ([place]) [I77.89] 11/12/2015  . Chronic obstructive pulmonary disease ([place]) [J44.9] 11/06/2015  . Arterial vascular disease [I70.90] 10/24/2014  . Centrilobular emphysema ([place]) [J43.2] 10/24/2014  . Gastro-esophageal reflux disease without esophagitis [K21.9] 10/24/2014  . Obstructive apnea [G47.33] 10/24/2014  . Restless leg [G25.81] 10/24/2014    Total Time spent with patient: 1 hour  Subjective:   Bradley Bowers is a 70 y.o. male patient admitted with "I was feeling great but now I want to kill myself".  HPI:  Patient interviewed. Also had a conversation with Dr. [name] who brought the patient into the emergency room and reviewed her notes. Chart reviewed. This is a 70 year old man who has had depressive symptoms for several months now but for the last 3 days apparently has flipped into a manic state. Patient was seen by Dr.Kapur today who found him to be psychotic hyper religious and disorganized in his thinking and requiring hospitalization. Patient had first presented with depression and apparently a few months ago and was initially treated by his primary caretaker. He has had a major stress over the last few months. It is a complicated story but apparently he had been having troubles with behaviors around pornography but has somehow gotten ensnared with charges about child pornography  although the patient denies that he actually had any intent to possess such images. It has produced a serious legal problem obviously. Patient today is presenting with hyper religious speech. Talking about how he is going to heaven. Talking about how he needs to preach to people and spread her religious message. Energy level very high. Racing thoughts. He is on the other hand also making statements about being suicidal.  Social history: Retired from work in the [employer]. Lives with his wife. Serious legal problems that are rather complicated.  Medical history: COPD history of MI history of GI reflux history of restless legs  Substance abuse history: Denies any current or past alcohol abuse or drug abuse  Past Psychiatric History: Patient has only been treated for depression and OCD for the last several months. Apparently was on a couple of different serotonin reuptake inhibitors. Had never been in the hospital before. No previous suicide attempts. Only now presenting as psychotic and manic like no previous history of bipolar disorder  Risk to Self: Is patient at risk for suicide?: No Risk to Others:   Prior Inpatient Therapy:   Prior Outpatient Therapy:    Past Medical History:  Past Medical History:  Diagnosis Date  . Arterial vascular disease 10/24/2014   Overview:  MI 1994, small-vessel disease requiring coumadin   . Asthma   . Centrilobular emphysema ([place]) 10/24/2014   Overview:  2L O2 prn   . Chronic obstructive pulmonary disease ([place]) 11/06/2015  . GERD (gastroesophageal reflux disease)   . Glaucoma   . Heart attack   . Heart disease   . Hyperlipemia   . Hypertension   . Restless leg 10/24/2014  . Sleep apnea  Past Surgical History:  Procedure Laterality Date  . CATARACT EXTRACTION W/ INTRAOCULAR LENS  IMPLANT, BILATERAL     Family History:  Family History  Problem Relation Age of Onset  . Nephrolithiasis Father   . Prostate cancer Neg Hx   . Bladder Cancer Neg Hx    . Kidney cancer Neg Hx    Family Psychiatric  History: Patient is not aware of any blood relatives of his who have had any mental health problems Social History:  History  Alcohol Use No     History  Drug Use No    Social History   Social History  . Marital status: Married    Spouse name: N/A  . Number of children: N/A  . Years of education: N/A   Social History Main Topics  . Smoking status: Former Research scientist (life sciences)  . Smokeless tobacco: Never Used  . Alcohol use No  . Drug use: No  . Sexual activity: Not Asked   Other Topics Concern  . None   Social History Narrative  . None   Additional Social History:    Allergies:  No Known Allergies  Labs:  Results for orders placed or performed during the hospital encounter of 09/23/16 (from the past 48 hour(s))  Comprehensive metabolic panel     Status: Abnormal   Collection Time: 09/23/16  1:25 PM  Result Value Ref Range   Sodium 136 135 - 145 mmol/L   Potassium 4.2 3.5 - 5.1 mmol/L   Chloride 107 101 - 111 mmol/L   CO2 22 22 - 32 mmol/L   Glucose, Bld 127 (H) 65 - 99 mg/dL   BUN 34 (H) 6 - 20 mg/dL   Creatinine, Ser 1.28 (H) 0.61 - 1.24 mg/dL   Calcium 9.1 8.9 - 10.3 mg/dL   Total Protein 7.9 6.5 - 8.1 g/dL   Albumin 4.3 3.5 - 5.0 g/dL   AST 32 15 - 41 U/L   ALT 20 17 - 63 U/L   Alkaline Phosphatase 82 38 - 126 U/L   Total Bilirubin 0.8 0.3 - 1.2 mg/dL   GFR calc non Af Amer 56 (L) >60 mL/min   GFR calc Af Amer >60 >60 mL/min    Comment: (NOTE) The eGFR has been calculated using the CKD EPI equation. This calculation has not been validated in all clinical situations. eGFR's persistently <60 mL/min signify possible Chronic Kidney Disease.    Anion gap 7 5 - 15  Ethanol     Status: None   Collection Time: 09/23/16  1:25 PM  Result Value Ref Range   Alcohol, Ethyl (B) <5 <5 mg/dL    Comment:        LOWEST DETECTABLE LIMIT FOR SERUM ALCOHOL IS 5 mg/dL FOR MEDICAL PURPOSES ONLY   Salicylate level     Status: None    Collection Time: 09/23/16  1:25 PM  Result Value Ref Range   Salicylate Lvl <8.9 2.8 - 30.0 mg/dL  Acetaminophen level     Status: Abnormal   Collection Time: 09/23/16  1:25 PM  Result Value Ref Range   Acetaminophen (Tylenol), Serum <10 (L) 10 - 30 ug/mL    Comment:        THERAPEUTIC CONCENTRATIONS VARY SIGNIFICANTLY. A RANGE OF 10-30 ug/mL MAY BE AN EFFECTIVE CONCENTRATION FOR MANY PATIENTS. HOWEVER, SOME ARE BEST TREATED AT CONCENTRATIONS OUTSIDE THIS RANGE. ACETAMINOPHEN CONCENTRATIONS >150 ug/mL AT 4 HOURS AFTER INGESTION AND >50 ug/mL AT 12 HOURS AFTER INGESTION ARE OFTEN ASSOCIATED WITH TOXIC REACTIONS.  cbc     Status: Abnormal   Collection Time: 09/23/16  1:25 PM  Result Value Ref Range   WBC 14.2 (H) 3.8 - 10.6 K/uL   RBC 5.43 4.40 - 5.90 MIL/uL   Hemoglobin 15.5 13.0 - 18.0 g/dL   HCT 47.1 40.0 - 52.0 %   MCV 86.8 80.0 - 100.0 fL   MCH 28.6 26.0 - 34.0 pg   MCHC 33.0 32.0 - 36.0 g/dL   RDW 14.3 11.5 - 14.5 %   Platelets 219 150 - 440 K/uL    Current Facility-Administered Medications  Medication Dose Route Frequency Provider Last Rate Last Dose  . albuterol (PROVENTIL HFA;VENTOLIN HFA) 108 (90 Base) MCG/ACT inhaler 2 puff  2 puff Inhalation Q4H PRN Gonzella Lex, MD      . aspirin EC tablet 81 mg  81 mg Oral Daily Drenda Freeze, MD      . atenolol (TENORMIN) tablet 50 mg  50 mg Oral Daily Drenda Freeze, MD      . atorvastatin (LIPITOR) tablet 80 mg  80 mg Oral q1800 Drenda Freeze, MD      . gabapentin (NEURONTIN) capsule 300 mg  300 mg Oral QHS Drenda Freeze, MD      . lisinopril (PRINIVIL,ZESTRIL) tablet 5 mg  5 mg Oral Daily Drenda Freeze, MD      . mometasone-formoterol (DULERA) 200-5 MCG/ACT inhaler 2 puff  2 puff Inhalation BID Gonzella Lex, MD      . OLANZapine (ZYPREXA) tablet 10 mg  10 mg Oral QHS Gonzella Lex, MD      . pantoprazole (PROTONIX) EC tablet 40 mg  40 mg Oral Daily Drenda Freeze, MD      . ticagrelor  [facility]) tablet 90 mg  90 mg Oral BID Drenda Freeze, MD       Current Outpatient Prescriptions  Medication Sig Dispense Refill  . albuterol (PROAIR HFA) 108 (90 Base) MCG/ACT inhaler Inhale into the lungs.    Marland Kitchen aspirin 81 MG EC tablet TAKE 1 TABLET (81 MG TOTAL) BY MOUTH ONCE DAILY.  11  . atenolol (TENORMIN) 50 MG tablet TAKE 1 TABLET (50 MG TOTAL) BY MOUTH ONCE DAILY.  1  . atorvastatin (LIPITOR) 80 MG tablet TAKE 1 TABLET (80 MG TOTAL) BY MOUTH ONCE DAILY.  1  . Calcium-Magnesium-Vitamin D (CALCIUM+D3 GRADUAL RELEASE) 600-40-500 MG-MG-UNIT TB24 Take by mouth.    . fluticasone (FLONASE) 50 MCG/ACT nasal spray 1 spray by Each Nare route daily.    . Fluticasone-Salmeterol (ADVAIR DISKUS) 250-50 MCG/DOSE AEPB Inhale into the lungs.    . gabapentin (NEURONTIN) 300 MG capsule Take 300 mg by mouth.    Marland Kitchen lisinopril (PRINIVIL,ZESTRIL) 5 MG tablet TAKE 1 TABLET (5 MG TOTAL) BY MOUTH ONCE DAILY.  11  . methocarbamol (ROBAXIN) 500 MG tablet Take 500 mg by mouth 3 (three) times daily as needed.  1  . omeprazole (PRILOSEC) 40 MG capsule TAKE 1 CAPSULE (40 MG TOTAL) BY MOUTH 2 (TWO) TIMES DAILY.  3  . ticagrelor (BRILINTA) 90 MG TABS tablet Take by mouth.    . timolol (TIMOPTIC) 0.5 % ophthalmic solution USE 1 DROP(S) IN BOTH EYES ONCE A DAY  5  . tiotropium (SPIRIVA) 18 MCG inhalation capsule Place into inhaler and inhale.    . Travoprost, BAK Free, (TRAVATAN Z) 0.004 % SOLN ophthalmic solution Frequency:PHARMDIR   Dosage:0.0     Instructions:  Note:1 gtt in each eye q pm Dose:  1      Musculoskeletal: Strength & Muscle Tone: within normal limits Gait & Station: normal Patient leans: N/A  Psychiatric Specialty Exam: Physical Exam  Nursing note and vitals reviewed. Constitutional: He appears well-developed and well-nourished.  HENT:  Head: Normocephalic and atraumatic.  Eyes: Conjunctivae are normal. Pupils are equal, round, and reactive to light.  Neck: Normal range of motion.   Cardiovascular: Regular rhythm and normal heart sounds.   Respiratory: He is in respiratory distress.  GI: Soft.  Musculoskeletal: Normal range of motion.  Neurological: He is alert.  Skin: Skin is warm and dry.  Psychiatric: His mood appears anxious. His affect is labile and inappropriate. His speech is rapid and/or pressured. He is agitated. Thought content is delusional. Cognition and memory are impaired. He expresses impulsivity. He expresses suicidal ideation.    Review of Systems  Constitutional: Negative.   HENT: Negative.   Eyes: Negative.   Respiratory: Negative.   Cardiovascular: Negative.   Gastrointestinal: Negative.   Musculoskeletal: Negative.   Skin: Negative.   Neurological: Negative.   Psychiatric/Behavioral: Positive for depression and suicidal ideas. Negative for hallucinations, memory loss and substance abuse. The patient is nervous/anxious. The patient does not have insomnia.     Blood pressure (!) 184/62, pulse 83, temperature 97.7 F (36.5 C), temperature source Oral, resp. rate (!) 22, height _0  (1.651 m), weight 78 kg (172 lb), SpO2 95 %.Body mass index is 28.62 kg/m.  General Appearance: Fairly Groomed  Eye Contact:  Fair  Speech:  Pressured  Volume:  Increased  Mood:  Depressed and Euphoric  Affect:  Labile  Thought Process:  Disorganized  Orientation:  Full (Time, Place, and Person)  Thought Content:  Illogical, Delusions, Rumination and Tangential  Suicidal Thoughts:  Yes.  with intent/plan  Homicidal Thoughts:  No  Memory:  Immediate;   Good Recent;   Fair Remote;   Fair  Judgement:  Impaired  Insight:  Shallow  Psychomotor Activity:  Restlessness  Concentration:  Concentration: Fair  Recall:  [employer] of Knowledge:  Fair  Language:  Fair  Akathisia:  No  Handed:  Right  AIMS (if indicated):     Assets:  Communication Skills Desire for Improvement Financial Resources/Insurance Housing Resilience Social Support  ADL's:  Intact   Cognition:  WNL  Sleep:        Treatment Plan Summary: Daily contact with patient to assess and evaluate symptoms and progress in treatment, Medication management and Plan 70 year old man presenting with mania and psychosis for just the past 3 days. Differential diagnosis could include mania caused by his antidepressant medicine or brief reactive psychosis or underlying bipolar disorder. Hyper religious and making suicidal statements. Patient will be admitted to the psychiatric unit. I have asked the ER physician to go ahead and filed commitment papers. Continue usual outpatient medicine but stopped the antidepressants and start Zyprexa along with when necessary medicine for agitation.  Disposition: Recommend psychiatric Inpatient admission when medically cleared. Supportive therapy provided about ongoing stressors.  Alethia Berthold, MD 09/23/2016 4:10 PM

## 2016-09-23 NOTE — ED Notes (Signed)
Sent urine on the patient

## 2016-09-23 NOTE — BH Assessment (Signed)
Assessment Note  Bradley Bowers is an 70 y.o. male. Bradley Bowers arrived to the ED by way of personal transportation with his wife.  He reports that he "Tried to tell the truth and it got me in trouble".  He states that he is a disciple of God and that no one believed him.  He reports some depression.  He denied current anxiety.  He denied having hallucinations.  He denied suicidal and homicidal ideation or intent.  He reports that he told 2 individuals that he wanted to kill himself, but he knows now that suicide would mean that he would not go to heaven.  He spoke that mean word from his head come from the devil and good words from his heart are from God.  He shared about time travel, and spoke tangentially about multiple topics in rapid succession.  IVC paperwork reports "patient is demonstrating decompensated mania with hyper-religious thoughts".   Diagnosis: Mania  Past Medical History:  Past Medical History:  Diagnosis Date  . Arterial vascular disease 10/24/2014   Overview:  MI 1994, small-vessel disease requiring coumadin   . Asthma   . Centrilobular emphysema ([place]) 10/24/2014   Overview:  2L O2 prn   . Chronic obstructive pulmonary disease ([place]) 11/06/2015  . GERD (gastroesophageal reflux disease)   . Glaucoma   . Heart attack   . Heart disease   . Hyperlipemia   . Hypertension   . Restless leg 10/24/2014  . Sleep apnea     Past Surgical History:  Procedure Laterality Date  . CATARACT EXTRACTION W/ INTRAOCULAR LENS  IMPLANT, BILATERAL      Family History:  Family History  Problem Relation Age of Onset  . Nephrolithiasis Father   . Prostate cancer Neg Hx   . Bladder Cancer Neg Hx   . Kidney cancer Neg Hx     Social History:  reports that he has quit smoking. He has never used smokeless tobacco. He reports that he does not drink alcohol or use drugs.  Additional Social History:  Alcohol / Drug Use History of alcohol / drug use?: No history of alcohol / drug  abuse  CIWA: CIWA-Ar BP: (!) 184/62 Pulse Rate: 83 COWS:    Allergies: No Known Allergies  Home Medications:  (Not in a hospital admission)  OB/GYN Status:  No LMP for male patient.  General Assessment Data Location of Assessment: [facility] ED TTS Assessment: In system Is this a Tele or Face-to-Face Assessment?: Face-to-Face Is this an Initial Assessment or a Re-assessment for this encounter?: Initial Assessment Marital status: Married Fowler name: n/a Is patient pregnant?: No Pregnancy Status: No Living Arrangements: Spouse/significant other Can pt return to current living arrangement?: Yes Admission Status: Involuntary Is patient capable of signing voluntary admission?: Yes Referral Source: Self/Family/Friend Insurance type: Health advantage  Medical Screening Exam ([place]) Medical Exam completed: Yes  Crisis Care Plan Living Arrangements: Spouse/significant other Legal Guardian: Other: (Self) Name of Psychiatrist: Dr. Jake Bowers Name of Therapist: Dr. Jake Bowers  Education Status Is patient currently in school?: No Current Grade: n/a Highest grade of school patient has completed: Junior in [name]  Name of school: [employer] person: n/a  Risk to self with the past 6 months Suicidal Ideation: Yes-Currently Present Has patient been a risk to self within the past 6 months prior to admission? : No Suicidal Intent: No Has patient had any suicidal intent within the past 6 months prior to admission? : No Is patient at risk for  suicide?: No Suicidal Plan?: No Has patient had any suicidal plan within the past 6 months prior to admission? : No Access to Means: No What has been your use of drugs/alcohol within the last 12 months?: denied Previous Attempts/Gestures: No How many times?: 0 Other Self Harm Risks: denied Triggers for Past Attempts: None known Intentional Self Injurious Behavior: None Family Suicide History: No Recent stressful life  event(s):  (None reported) Persecutory voices/beliefs?: No Depression: No Substance abuse history and/or treatment for substance abuse?: No Suicide prevention information given to non-admitted patients: Not applicable  Risk to Others within the past 6 months Homicidal Ideation: No Does patient have any lifetime risk of violence toward others beyond the six months prior to admission? : No Thoughts of Harm to Others: No Current Homicidal Intent: No Current Homicidal Plan: No Access to Homicidal Means: No Identified Victim: None identified History of harm to others?: No Assessment of Violence: None Noted Violent Behavior Description: None reported Does patient have access to weapons?: No Criminal Charges Pending?: Yes Describe Pending Criminal Charges: child pornography on phone (Third degree sexual exploitation of a child) Does patient have a court date: Yes Court Date: 10/22/16 Is patient on probation?: No  Psychosis Hallucinations: None noted Delusions: Grandiose  Mental Status Report Appearance/Hygiene: In scrubs Eye Contact: Good Motor Activity: Unremarkable Speech: Tangential Level of Consciousness: Alert Mood: Euphoric Affect: Appropriate to circumstance Anxiety Level: None Thought Processes: Tangential, Flight of Ideas Judgement: Impaired Orientation: Person, Place Obsessive Compulsive Thoughts/Behaviors: None  Cognitive Functioning Concentration: Poor Memory: Unable to Assess IQ: Average Insight: Poor Impulse Control: Poor Appetite: Fair Sleep: No Change Vegetative Symptoms: None  ADLScreening [facility] Assessment Services) Patient's cognitive ability adequate to safely complete daily activities?: Yes Patient able to express need for assistance with ADLs?: Yes Independently performs ADLs?: Yes (appropriate for developmental age)  Prior Inpatient Therapy Prior Inpatient Therapy: No Prior Therapy Dates: n/a Prior Therapy Facilty/Provider(s): n/a Reason for  Treatment: n/a  Prior Outpatient Therapy Prior Outpatient Therapy: Yes Prior Therapy Dates: Current Prior Therapy Facilty/Provider(s): [facility] - Bradley Bowers Reason for Treatment: Bipolar Disorder Does patient have an ACCT team?: No Does patient have Intensive In-House Services?  : No Does patient have [facility] services? : No Does patient have P4CC services?: No  ADL Screening (condition at time of admission) Patient's cognitive ability adequate to safely complete daily activities?: Yes Patient able to express need for assistance with ADLs?: Yes Independently performs ADLs?: Yes (appropriate for developmental age)       Abuse/Neglect Assessment (Assessment to be complete while patient is alone) Physical Abuse: Denies Verbal Abuse: Denies Sexual Abuse: Denies Exploitation of patient/patient's resources: Denies Self-Neglect: Denies     Regulatory affairs officer (For Healthcare) Does Patient Have a Medical Advance Directive?: Yes Does patient want to make changes to medical advance directive?: No - Patient declined    Additional Information 1:1 In Past 12 Months?: No CIRT Risk: No Elopement Risk: No Does patient have medical clearance?: Yes     Disposition:  Disposition Initial Assessment Completed for this Encounter: Yes Disposition of Patient: Inpatient treatment program Type of inpatient treatment program: Adult  On Site Evaluation by:   Reviewed with Physician:    Elmer Bales 09/23/2016 9:03 PM

## 2016-09-23 NOTE — Tx Team (Signed)
Initial Treatment Plan 09/23/2016 11:47 PM Bradley Bowers [id number]    PATIENT STRESSORS: Financial difficulties Health problems Legal issue   PATIENT STRENGTHS: General fund of knowledge Supportive family/friends   PATIENT IDENTIFIED PROBLEMS: "I want to get out of here and teach you about WWII."  Suicidal Ideations   Psychosis   Hypertension                DISCHARGE CRITERIA:  Improved stabilization in mood, thinking, and/or behavior  PRELIMINARY DISCHARGE PLAN: Outpatient therapy  PATIENT/FAMILY INVOLVEMENT: This treatment plan has been presented to and reviewed with the patient, [name], and/or family member.  The patient and family have been given the opportunity to ask questions and make suggestions.  Derrill Kay, RN 09/23/2016, 11:47 PM

## 2016-09-23 NOTE — ED Triage Notes (Signed)
Pt brought over by Dr [name] with manic behavior for three days. Pt recently brought up on charges for child porn that was sent to his cell phone without request.  Dr [name] states that his zoloft was recently increased  From 50mg  to 100mg . Pt wth hx of bipolar and depression, axiety. Wife with pt out in triage.

## 2016-09-23 NOTE — ED Notes (Signed)
Patient assigned to appropriate care area. Patient oriented to unit/care area: Informed that, for their safety, care areas are designed for safety and monitored by security cameras at all times; and visiting hours explained to patient. Patient verbalizes understanding, and verbal contract for safety obtained. 

## 2016-09-23 NOTE — ED Notes (Addendum)
Pt states he is here because he is being unjustly accused of having child pornography on his phone. Pt states it's all because of the computer and he would like to go back in time and shoot whoever invented the computer. Pt states he was not suicidal until he got here. Pt states he now thinks he should kill himself and take seven or eight others with him. Pt states he just now decided that is what he should do. Pt is calm and cooperative. Pt accepted water but denied wanting anything to eat. Pt redirects easily but states "you just don't want to listen to me".

## 2016-09-24 ENCOUNTER — Encounter: Payer: Self-pay | Admitting: Psychiatry

## 2016-09-24 DIAGNOSIS — H409 Unspecified glaucoma: Secondary | ICD-10-CM

## 2016-09-24 DIAGNOSIS — F312 Bipolar disorder, current episode manic severe with psychotic features: Principal | ICD-10-CM

## 2016-09-24 DIAGNOSIS — I2510 Atherosclerotic heart disease of native coronary artery without angina pectoris: Secondary | ICD-10-CM

## 2016-09-24 LAB — URINALYSIS, COMPLETE (UACMP) WITH MICROSCOPIC
BACTERIA UA: NONE SEEN
BILIRUBIN URINE: NEGATIVE
GLUCOSE, UA: NEGATIVE mg/dL
KETONES UR: NEGATIVE mg/dL
LEUKOCYTES UA: NEGATIVE
NITRITE: NEGATIVE
PH: 5 (ref 5.0–8.0)
PROTEIN: NEGATIVE mg/dL
Specific Gravity, Urine: 1.018 (ref 1.005–1.030)

## 2016-09-24 LAB — LIPID PANEL
CHOLESTEROL: 177 mg/dL (ref 0–200)
HDL: 41 mg/dL (ref >40)
LDL Cholesterol: 111 mg/dL — ABNORMAL HIGH (ref 0–99)
TRIGLYCERIDES: 126 mg/dL (ref <150)
Total CHOL/HDL Ratio: 4.3 RATIO
VLDL: 25 mg/dL (ref 0–40)

## 2016-09-24 LAB — RAPID HIV SCREEN (HIV 1/2 AB+AG)
HIV 1/2 Antibodies: NONREACTIVE
HIV-1 P24 Antigen - HIV24: NONREACTIVE

## 2016-09-24 LAB — VITAMIN B12: Vitamin B-12: 189 pg/mL (ref 180–914)

## 2016-09-24 LAB — TSH: TSH: 1.886 u[IU]/mL (ref 0.350–4.500)

## 2016-09-24 LAB — AMMONIA: Ammonia: 22 umol/L (ref 9–35)

## 2016-09-24 MED ORDER — LATANOPROST 0.005 % OP SOLN
1.0000 [drp] | Freq: Every day | OPHTHALMIC | Status: DC
  Administered 2016-09-24 – 2016-10-05 (×12): 1 [drp] via OPHTHALMIC
  Filled 2016-09-24: qty 2.5

## 2016-09-24 MED ORDER — TIMOLOL MALEATE 0.5 % OP SOLN
1.0000 [drp] | Freq: Every day | OPHTHALMIC | Status: DC
  Administered 2016-09-24 – 2016-10-06 (×13): 1 [drp] via OPHTHALMIC
  Filled 2016-09-24: qty 5

## 2016-09-24 MED ORDER — LORAZEPAM 1 MG PO TABS
1.0000 mg | ORAL_TABLET | Freq: Four times a day (QID) | ORAL | Status: DC | PRN
  Administered 2016-09-25 – 2016-10-05 (×2): 1 mg via ORAL
  Filled 2016-09-24 (×2): qty 1

## 2016-09-24 MED ORDER — TIOTROPIUM BROMIDE MONOHYDRATE 18 MCG IN CAPS
18.0000 ug | ORAL_CAPSULE | Freq: Every day | RESPIRATORY_TRACT | Status: DC
  Administered 2016-09-24 – 2016-10-06 (×13): 18 ug via RESPIRATORY_TRACT
  Filled 2016-09-24 (×3): qty 5

## 2016-09-24 MED ORDER — ONDANSETRON 8 MG PO TBDP
8.0000 mg | ORAL_TABLET | Freq: Three times a day (TID) | ORAL | Status: DC | PRN
  Filled 2016-09-24: qty 1

## 2016-09-24 MED ORDER — ONDANSETRON 8 MG PO TBDP
8.0000 mg | ORAL_TABLET | Freq: Three times a day (TID) | ORAL | Status: DC | PRN
Start: 2016-09-24 — End: 2016-09-24

## 2016-09-24 MED ORDER — LORAZEPAM 1 MG PO TABS
1.0000 mg | ORAL_TABLET | Freq: Every day | ORAL | Status: DC
  Administered 2016-09-24 – 2016-09-25 (×2): 1 mg via ORAL
  Filled 2016-09-24 (×2): qty 1

## 2016-09-24 MED ORDER — FLUTICASONE PROPIONATE 50 MCG/ACT NA SUSP
1.0000 | Freq: Every day | NASAL | Status: DC | PRN
  Filled 2016-09-24: qty 16

## 2016-09-24 MED ORDER — ENSURE ENLIVE PO LIQD
237.0000 mL | Freq: Two times a day (BID) | ORAL | Status: DC
  Administered 2016-09-25 – 2016-10-06 (×22): 237 mL via ORAL

## 2016-09-24 MED ORDER — OLANZAPINE 10 MG PO TABS
5.0000 mg | ORAL_TABLET | Freq: Every day | ORAL | Status: DC
  Administered 2016-09-24 – 2016-09-25 (×2): 5 mg via ORAL
  Filled 2016-09-24 (×2): qty 1

## 2016-09-24 NOTE — [facility] Suicide Risk Assessment (Signed)
[facility] Admission Suicide Risk Assessment   Nursing information obtained from:  Patient Demographic factors:  Male, Age 70 or older, Unemployed, Access to firearms, Low socioeconomic status Current Mental Status:  NA Loss Factors:  Legal issues, Financial problems / change in socioeconomic status Historical Factors:  NA Risk Reduction Factors:  Religious beliefs about death, Living with another person, especially a relative, Positive social support  Total Time spent with patient: 1 hour Principal Problem: Bipolar affective disorder, current episode manic with psychotic symptoms ([place]) Diagnosis:   Patient Active Problem List   Diagnosis Date Noted  . Coronary artery disease [I25.10] 09/24/2016  . Bipolar affective disorder, current episode manic with psychotic symptoms ([place]) [F31.2] 09/23/2016  . Chronic obstructive pulmonary disease ([place]) [J44.9] 11/06/2015  . Gastro-esophageal reflux disease without esophagitis [K21.9] 10/24/2014  . Obstructive apnea [G47.33] 10/24/2014  . Restless leg [G25.81] 10/24/2014   Subjective Data:   Continued Clinical Symptoms:  Alcohol Use Disorder Identification Test Final Score (AUDIT): 0 The "Alcohol Use Disorders Identification Test", Guidelines for Use in Primary Care, Second Edition.  World Pharmacologist [facility]). Score between 0-7:  no or low risk or alcohol related problems. Score between 8-15:  moderate risk of alcohol related problems. Score between 16-19:  high risk of alcohol related problems. Score 20 or above:  warrants further diagnostic evaluation for alcohol dependence and treatment.   CLINICAL FACTORS:   Severe Anxiety and/or Agitation Currently Psychotic Previous Psychiatric Diagnoses and Treatments     Psychiatric Specialty Exam: Physical Exam  ROS  Blood pressure (!) 158/71, pulse 78, temperature 97.6 F (36.4 C), temperature source Oral, resp. rate 20, height 5\' 6"  (1.676 m), weight 75.8 kg (167 lb), SpO2 100 %.Body mass  index is 26.95 kg/m.                                                    Sleep:  Number of Hours: 4.5      COGNITIVE FEATURES THAT CONTRIBUTE TO RISK:  Loss of executive function    SUICIDE RISK:   Moderate:  Frequent suicidal ideation with limited intensity, and duration, some specificity in terms of plans, no associated intent, good self-control, limited dysphoria/symptomatology, some risk factors present, and identifiable protective factors, including available and accessible social support.   PLAN OF CARE: admit to [facility]  I certify that inpatient services furnished can reasonably be expected to improve the patient's condition.  Hildred Priest, MD 09/24/2016, 12:04 PM

## 2016-09-24 NOTE — [facility] Group Notes (Signed)
[place] LCSW Group Therapy Note  Date/Time:09/24/2016  Type of Therapy/Topic:  Group Therapy:  Feelings about Diagnosis  Participation Level:  Active/disruptive at times  Mood: Manic   Description of Group:    This group will allow patients to explore their thoughts and feelings about diagnoses they have received. Patients will be guided to explore their level of understanding and acceptance of these diagnoses. Facilitator will encourage patients to process their thoughts and feelings about the reactions of others to their diagnosis, and will guide patients in identifying ways to discuss their diagnosis with significant others in their lives. This group will be process-oriented, with patients participating in exploration of their own experiences as well as giving and receiving support and challenge from other group members.   Therapeutic Goals: 1. Patient will demonstrate understanding of diagnosis as evidence by identifying two or more symptoms of the disorder:  2. Patient will be able to express two feelings regarding the diagnosis 3. Patient will demonstrate ability to communicate their needs through discussion and/or role plays  Summary of Patient Progress:  Pt unable to follow group content, requiring almost constant redirection, hyper-verbal and hyper-religious.  Finally Pt asked to leave due to level of disruption.    Therapeutic Modalities:   Cognitive Behavioral Therapy Brief Therapy Feelings Identification   Dossie Arbour, LCSW

## 2016-09-24 NOTE — Plan of Care (Signed)
Problem: Safety: Goal: Ability to remain free from injury will improve Outcome: Progressing Patient has remained free from injury during this shift.   

## 2016-09-24 NOTE — Progress Notes (Signed)
Patient ID: Bradley Bowers, male   DOB: 1946/01/15, 70 y.o.   MRN: [id number] Patient admitted from ED after manic symptoms at MD's office. Patient is hyperverbal and hyperreligions. Very tangential and fixated on the fact that I need to call Elray Buba, the [place]. And states he's going to get him out of this mess. Also fixated on his trading cards. Denies SI/HI/AVH. Patient oriented to the unit. Nourishment provided. Safety maintained with 15 min checks.

## 2016-09-24 NOTE — [facility] Counselor (Signed)
Adult Comprehensive Assessment  Patient ID: Bradley Bowers, male   DOB: 07-27-1946, 70 y.o.   MRN: [id number]  Information Source: Information source: Patient  Current Stressors:  Educational / Learning stressors: Pt denies Employment / Job issues: Pt is retired Family Relationships: Pt denies Museum/gallery curator / Lack of resources (include bankruptcy): Pt reports he is on a fixed income Housing / Lack of housing: Pt denies Physical health (include injuries & life threatening diseases): Pt reports he is "dying" because "everybody is dying" Social relationships: Pt denies Substance abuse: Pt denies Bereavement / Loss: Pt's brother passed away in January 06, 2009  Living/Environment/Situation:  Living Arrangements: Spouse/significant other Living conditions (as described by patient or guardian): Pt has a home How long has patient lived in current situation?: Since 01/07/88 What is atmosphere in current home: Comfortable, Supportive, Loving  Family History:  Marital status: Married Number of Years Married: 29 What types of issues is patient dealing with in the relationship?: Pt reports none, but pt thinks wife is "mad at me sometines" Does patient have children?: No  Childhood History:  By whom was/is the patient raised?: Both parents Additional childhood history information: Pt's mother in 74 Description of patient's relationship with caregiver when they were a child: Pt "loved them to death" Patient's description of current relationship with people who raised him/her: Parents are deceased Does patient have siblings?: Yes Number of Siblings: 1 Description of patient's current relationship with siblings: Pt reports yes Did patient suffer any verbal/emotional/physical/sexual abuse as a child?: No Did patient suffer from severe childhood neglect?: No Has patient ever been sexually abused/assaulted/raped as an adolescent or adult?: No Was the patient ever a victim of a crime or a disaster?:  No Witnessed domestic violence?: No Has patient been effected by domestic violence as an adult?: No  Education:  Highest grade of school patient has completed: Three years of college Currently a student?: No Learning disability?: No  Employment/Work Situation:   Employment situation:  (Pt is retired) What is the longest time patient has a held a job?: 23 years Where was the patient employed at that time?: [employer] Has patient ever been in the [employer]?: No  Financial Resources:   Museum/gallery curator resources: Armed forces training and education officer  Alcohol/Substance Abuse:   What has been your use of drugs/alcohol within the last 12 months?: Pt denies the use of alcohol and/or other substances except for prescribed medications If attempted suicide, did drugs/alcohol play a role in this?: No Alcohol/Substance Abuse Treatment Hx: Denies past history Has alcohol/substance abuse ever caused legal problems?: No  Social Support System:   Heritage manager System: Manufacturing engineer System: Pt reports the sheriff, and his wife and extended family  Type of faith/religion: Pt reports a "strong" Methodist How does patient's faith help to cope with current illness?: Pt reports prayer  Leisure/Recreation:   Leisure and Hobbies: Play golf formerly, now pt watches TV  Strengths/Needs:   What things does the patient do well?: Pt reports play golf formerly In what areas does patient struggle / problems for patient: Pt reports the internet is a problem for the pt  Discharge Plan:   Does patient have access to transportation?: Yes (Pt's wife) Will patient be returning to same living situation after discharge?: Yes Currently receiving community mental health services: Yes (From Whom) (Dr. [name], [facility]) Does patient have financial barriers related to discharge medications?: No  Summary/Recommendations:   Summary and Recommendations (to be completed by the evaluator):  Patient presented to the  hospital under IVC and was admitted for suicidal ideation and worsening symptoms of depression.  Pt's primary diagnosis is Bipolar affective disorder, current episode manic with psychotic symptoms ([name]) F31.2.  Pt reports primary triggers for admission were a visit to Dr. [name].  Pt reports his primary stressor is legal charges.  Pt now denies SI/HI/AVH.  Patient lives in [place], [place].  Pt lists supports in the community as his wife, the sheriff and his extended family.  Patient will benefit from crisis stabilization, medication evaluation, group therapy, and psycho education in addition to case management for discharge planning. Patient and CSW reviewed pt's identified goals and treatment plan. Pt verbalized understanding and agreed to treatment plan.  At discharge it is recommended that patient remain compliant with established plan and continue treatment.  Bradley Bowers. 09/24/2016

## 2016-09-24 NOTE — H&P (Signed)
Psychiatric Admission Assessment Adult  Patient Identification: Bradley Bowers MRN:  [id number] Date of Evaluation:  09/24/2016 Chief Complaint:  Bipolar disorder Principal Diagnosis: Bipolar affective disorder, current episode manic with psychotic symptoms ([place]) Diagnosis:   Patient Active Problem List   Diagnosis Date Noted  . Coronary artery disease [I25.10] 09/24/2016  . Glaucoma [H40.9] 09/24/2016  . Bipolar affective disorder, current episode manic with psychotic symptoms ([place]) [F31.2] 09/23/2016  . Chronic obstructive pulmonary disease ([place]) [J44.9] 11/06/2015  . Gastro-esophageal reflux disease without esophagitis [K21.9] 10/24/2014  . Obstructive apnea [G47.33] 10/24/2014  . Restless leg [G25.81] 10/24/2014   History of Present Illness:   Patient is a 70 year old married Caucasian male who was brought in by his outpatient psychiatrist to our emergency department on December 18.   In August of this year the patient was charged with child pornography after being found with pictures of minors in his cell phone. Patient has a court hearing for his charges in January. In September he was started on Prozac by his primary care provider and was referred to follow-up with Dr. [name] (outpatient psychiatrist). At some point in time the patient was switched from fluoxetine to sertraline which was increased from 50 mg to 100 mg.  The patient had a follow-up with Dr. [name] yesterday and he was found to be hyperreligious, disorganized, and was voicing suicidal ideation.  Per nursing notes in the ER "Pt states it's all because of the computer and he would like to go back in time and shoot whoever invented the computer. Pt states he was not suicidal until he got here. Pt states he now thinks he should kill himself and take seven or eight others with him."  During examination today the patient kept insisting on asking me if I was safe. Patient is a limited historian. He is very tangential. He  displays evidence of psychomotor agitation, poor attention and poor concentration pressure speech. There is some grandiosity. Patient talks about his IQ score a his SAT scores.Patient reports talking to God in prayer and hearing the voice of God being his head. He also hears the voice of the devil that tells him to get on on Facebook. Patient denies having problems with his mood, appetite, energy or sleep. He denies suicidality or homicidality today.  He denies having any traumatic experiences.  Substance abuse patient denies the use of any alcohol or illicit substances. Patient says he was "a drunk" for 22 years. He last used alcohol in 1989  Associated Signs/Symptoms: Depression Symptoms:  suicidal thoughts with specific plan, disturbed sleep, (Hypo) Manic Symptoms:  Distractibility, Elevated Mood, Flight of Ideas, Grandiosity, Impulsivity, Labiality of Mood, Anxiety Symptoms:  Excessive Worry, Psychotic Symptoms:  Hallucinations: Auditory PTSD Symptoms: Negative Total Time spent with patient: 1 hour  Past Psychiatric History: Patient denies any prior history of mental illness prior to summer of this year . Patient denies history of psychiatric hospitalizations or suicidal attempts   Is the patient at risk to self? Yes.    Has the patient been a risk to self in the past 6 months? No.  Has the patient been a risk to self within the distant past? No.  Is the patient a risk to others? No.  Has the patient been a risk to others in the past 6 months? No.  Has the patient been a risk to others within the distant past? No.   Alcohol Screening: 1. How often do you have a drink containing alcohol?: Never 2.  How many drinks containing alcohol do you have on a typical day when you are drinking?: 1 or 2 3. How often do you have six or more drinks on one occasion?: Never Preliminary Score: 0 9. Have you or someone else been injured as a result of your drinking?: No 10. Has a relative or  friend or a doctor or another health worker been concerned about your drinking or suggested you cut down?: No Alcohol Use Disorder Identification Test Final Score (AUDIT): 0 Brief Intervention: AUDIT score less than 7 or less-screening does not suggest unhealthy drinking-brief intervention not indicated  Past Medical History: Patient reports having MIs Past Medical History:  Diagnosis Date  . Arterial vascular disease 10/24/2014   Overview:  MI 1994, small-vessel disease requiring coumadin   . Asthma   . Centrilobular emphysema ([place]) 10/24/2014   Overview:  2L O2 prn   . Chronic obstructive pulmonary disease ([place]) 11/06/2015  . GERD (gastroesophageal reflux disease)   . Glaucoma   . Heart attack 2/17, 1994  . Heart disease   . Hyperlipemia   . Hypertension   . Restless leg 10/24/2014  . Sleep apnea     Past Surgical History:  Procedure Laterality Date  . CATARACT EXTRACTION W/ INTRAOCULAR LENS  IMPLANT, BILATERAL     Family History:  Family History  Problem Relation Age of Onset  . Nephrolithiasis Father   . Prostate cancer Neg Hx   . Bladder Cancer Neg Hx   . Kidney cancer Neg Hx    Family Psychiatric  History: Patient reports his brother died of liver disease related to alcoholism. He denies any history of mental illness in his family or suicides.   Tobacco Screening: Have you used any form of tobacco in the last 30 days? (Cigarettes, Smokeless Tobacco, Cigars, and/or Pipes): No   Social History: Patient lives with his wife. He has been married for 29 year. They do not have any children. Patient Retired from work in the [employer]. He has some college education. He does not have any military experience. As far as his legal history his raises serious charges related to child pornography  History  Alcohol Use No     History  Drug Use No    Additional Social History: Marital status: Married Number of Years Married: 29 What types of issues is patient dealing with in the  relationship?: Pt reports none, but pt thinks wife is "mad at me sometines" Does patient have children?: No     Allergies:  No Known Allergies   Lab Results:  Results for orders placed or performed during the hospital encounter of 09/23/16 (from the past 48 hour(s))  Lipid panel     Status: Abnormal   Collection Time: 09/24/16  7:04 AM  Result Value Ref Range   Cholesterol 177 0 - 200 mg/dL   Triglycerides 126 <150 mg/dL   HDL 41 >40 mg/dL   Total CHOL/HDL Ratio 4.3 RATIO   VLDL 25 0 - 40 mg/dL   LDL Cholesterol 111 (H) 0 - 99 mg/dL    Comment:        Total Cholesterol/HDL:CHD Risk Coronary Heart Disease Risk Table                     Men   Women  1/2 Average Risk   3.4   3.3  Average Risk       5.0   4.4  2 X Average Risk   9.6  7.1  3 X Average Risk  23.4   11.0        Use the calculated Patient Ratio above and the CHD Risk Table to determine the patient's CHD Risk.        ATP III CLASSIFICATION (LDL):  <100     mg/dL   Optimal  100-129  mg/dL   Near or Above                    Optimal  130-159  mg/dL   Borderline  160-189  mg/dL   High  >190     mg/dL   Very High   TSH     Status: None   Collection Time: 09/24/16  7:04 AM  Result Value Ref Range   TSH 1.886 0.350 - 4.500 uIU/mL    Comment: Performed by a 3rd Generation assay with a functional sensitivity of <=0.01 uIU/mL.  Rapid HIV screen (HIV 1/2 Ab+Ag)     Status: None   Collection Time: 09/24/16 10:28 AM  Result Value Ref Range   HIV-1 P24 Antigen - HIV24 NON REACTIVE NON REACTIVE   HIV 1/2 Antibodies NON REACTIVE NON REACTIVE   Interpretation (HIV Ag Ab)      A non reactive test result means that HIV 1 or HIV 2 antibodies and HIV 1 p24 antigen were not detected in the specimen.  Ammonia     Status: None   Collection Time: 09/24/16 10:28 AM  Result Value Ref Range   Ammonia 22 9 - 35 umol/L    Blood Alcohol level:  Lab Results  Component Value Date   ETH <5 [id number]    Metabolic Disorder Labs:   No results found for: HGBA1C, MPG No results found for: PROLACTIN Lab Results  Component Value Date   CHOL 177 09/24/2016   TRIG 126 09/24/2016   HDL 41 09/24/2016   CHOLHDL 4.3 09/24/2016   VLDL 25 09/24/2016   LDLCALC 111 (H) 09/24/2016    Current Medications: Current Facility-Administered Medications  Medication Dose Route Frequency Provider Last Rate Last Dose  . acetaminophen (TYLENOL) tablet 650 mg  650 mg Oral Q6H PRN Gonzella Lex, MD      . albuterol (PROVENTIL HFA;VENTOLIN HFA) 108 (90 Base) MCG/ACT inhaler 2 puff  2 puff Inhalation Q4H PRN Gonzella Lex, MD      . alum & mag hydroxide-simeth (MAALOX/MYLANTA) 200-200-20 MG/5ML suspension 30 mL  30 mL Oral Q4H PRN Gonzella Lex, MD      . aspirin EC tablet 81 mg  81 mg Oral Daily Gonzella Lex, MD   81 mg at 09/24/16 0901  . atenolol (TENORMIN) tablet 50 mg  50 mg Oral Daily Gonzella Lex, MD   50 mg at 09/24/16 0901  . atorvastatin (LIPITOR) tablet 80 mg  80 mg Oral q1800 Gonzella Lex, MD      . fluticasone (FLONASE) 50 MCG/ACT nasal spray 1 spray  1 spray Each Nare Daily PRN Hildred Priest, MD      . gabapentin (NEURONTIN) capsule 300 mg  300 mg Oral QHS John T Clapacs, MD      . latanoprost (XALATAN) 0.005 % ophthalmic solution 1 drop  1 drop Both Eyes QHS Hildred Priest, MD      . lisinopril (PRINIVIL,ZESTRIL) tablet 5 mg  5 mg Oral Daily Gonzella Lex, MD   5 mg at 09/24/16 0900  . magnesium hydroxide (MILK OF MAGNESIA) suspension 30 mL  30 mL  Oral Daily PRN Gonzella Lex, MD      . mometasone-formoterol [name]) 200-5 MCG/ACT inhaler 2 puff  2 puff Inhalation BID Gonzella Lex, MD   2 puff at 09/24/16 0905  . OLANZapine (ZYPREXA) tablet 10 mg  10 mg Oral QHS Gonzella Lex, MD      . pantoprazole (PROTONIX) EC tablet 40 mg  40 mg Oral Daily Gonzella Lex, MD   40 mg at 09/24/16 0903  . ticagrelor (BRILINTA) tablet 90 mg  90 mg Oral BID Gonzella Lex, MD   90 mg at 09/24/16 0900  .  timolol (TIMOPTIC) 0.5 % ophthalmic solution 1 drop  1 drop Both Eyes Daily Hildred Priest, MD      . tiotropium [facility]) inhalation capsule 18 mcg  18 mcg Inhalation Daily Hildred Priest, MD       PTA Medications: Prescriptions Prior to Admission  Medication Sig Dispense Refill Last Dose  . albuterol (PROAIR HFA) 108 (90 Base) MCG/ACT inhaler Inhale into the lungs.   Taking  . aspirin 81 MG EC tablet TAKE 1 TABLET (81 MG TOTAL) BY MOUTH ONCE DAILY.  11 Taking  . atenolol (TENORMIN) 50 MG tablet TAKE 1 TABLET (50 MG TOTAL) BY MOUTH ONCE DAILY.  1 Taking  . atorvastatin (LIPITOR) 80 MG tablet TAKE 1 TABLET (80 MG TOTAL) BY MOUTH ONCE DAILY.  1 Taking  . Calcium-Magnesium-Vitamin D (CALCIUM+D3 GRADUAL RELEASE) 600-40-500 MG-MG-UNIT TB24 Take by mouth.   Taking  . fluticasone (FLONASE) 50 MCG/ACT nasal spray 1 spray by Each Nare route daily.   Taking  . Fluticasone-Salmeterol (ADVAIR DISKUS) 250-50 MCG/DOSE AEPB Inhale into the lungs.   Taking  . gabapentin (NEURONTIN) 300 MG capsule Take 300 mg by mouth.   Taking  . lisinopril (PRINIVIL,ZESTRIL) 5 MG tablet TAKE 1 TABLET (5 MG TOTAL) BY MOUTH ONCE DAILY.  11 Taking  . methocarbamol (ROBAXIN) 500 MG tablet Take 500 mg by mouth 3 (three) times daily as needed.  1 Taking  . omeprazole (PRILOSEC) 40 MG capsule TAKE 1 CAPSULE (40 MG TOTAL) BY MOUTH 2 (TWO) TIMES DAILY.  3 Taking  . ticagrelor (BRILINTA) 90 MG TABS tablet Take by mouth.   Taking  . timolol (TIMOPTIC) 0.5 % ophthalmic solution USE 1 DROP(S) IN BOTH EYES ONCE A DAY  5 Taking  . tiotropium (SPIRIVA) 18 MCG inhalation capsule Place into inhaler and inhale.   Taking  . Travoprost, BAK Free, (TRAVATAN Z) 0.004 % SOLN ophthalmic solution Frequency:PHARMDIR   Dosage:0.0     Instructions:  Note:1 gtt in each eye q pm Dose: 1   Taking    Musculoskeletal: Strength & Muscle Tone: within normal limits Gait & Station: normal Patient leans: N/A  Psychiatric Specialty  Exam: Physical Exam  ROS  Blood pressure (!) 158/71, pulse 78, temperature 97.6 F (36.4 C), temperature source Oral, resp. rate 20, height 5\' 6"  (1.676 m), weight 75.8 kg (167 lb), SpO2 100 %.Body mass index is 26.95 kg/m.  General Appearance: Disheveled  Eye Contact:  Good  Speech:  Pressured  Volume:  Increased  Mood:  Euphoric  Affect:  Labile  Thought Process:  Irrelevant and Descriptions of Associations: Tangential  Orientation:  Full (Time, Place, and Person)  Thought Content:  Hallucinations: Auditory  Suicidal Thoughts:  No  Homicidal Thoughts:  No  Memory:  Immediate;   Poor Recent;   Fair Remote;   Fair  Judgement:  Impaired  Insight:  Lacking  Psychomotor Activity:  Increased  Concentration:  Concentration: Poor and Attention Span: Poor  Recall:  [employer] of Knowledge:  Fair  Language:  Good  Akathisia:  No  Handed:    AIMS (if indicated):     Assets:  Agricultural consultant Housing Social Support  ADL's:  Intact  Cognition:  Impaired,  Mild  Sleep:  Number of Hours: 4.5    Treatment Plan Summary:  Patient is a 70 year old married Caucasian who is presenting with mania.   For bipolar disorder the patient has been started on olanzapine 10 mg by mouth daily at bedtime. I plan to increase the dose to 5 mg q am and 10 mg qhs    For insomnia patient will be started on Ativan 1 mg by mouth daily at bedtime. Patient only slept 4 hours last night  As patient does not have a prior history of bipolar disorder I will order B12, HIV, RPR, ammonia level, and a brain MRI.  For hypertension he will be continued on lisinopril and atenolol  For cardiovascular disease continue Brilinta  For COPD he will be continued on albuterol, dulera and spiriva.  For dyslipidemia he will be continued on Lipitor  For GERD he will be continued on PPI  For glaucoma continue eyedrops  For restless leg syndrome continue Neurontin 300 mg daily at  bedtime.  I have reviewed records and notes from his primary care was started treating him for depression back in September. I contacted the patient's wife had [phone] her name is Gerik Bramlett. She reports patient started having manic symptoms on Saturday. He was talking nonstop. Wanting to deliver Christmas present to 7 AM to somebody's house on Sunday morning. He was becoming more agitated and confrontational with his wife on Sunday. She denies any prior history of symptoms like this. There is also no family history of mental illness that she is aware of.   Physician Treatment Plan for Primary Diagnosis: Bipolar affective disorder, current episode manic with psychotic symptoms ([place]) Long Term Goal(s): Improvement in symptoms so as ready for discharge  Short Term Goals: Ability to verbalize feelings will improve, Ability to disclose and discuss suicidal ideas, Ability to demonstrate self-control will improve and Ability to identify and develop effective coping behaviors will improve  Physician Treatment Plan for Secondary Diagnosis: Principal Problem:   Bipolar affective disorder, current episode manic with psychotic symptoms ([place]) Active Problems:   Chronic obstructive pulmonary disease ([place])   Gastro-esophageal reflux disease without esophagitis   Obstructive apnea   Restless leg   Coronary artery disease   Glaucoma  Long Term Goal(s): Improvement in symptoms so as ready for discharge  Short Term Goals: Ability to identify changes in lifestyle to reduce recurrence of condition will improve, Ability to demonstrate self-control will improve, Ability to identify and develop effective coping behaviors will improve and Ability to identify triggers associated with substance abuse/mental health issues will improve  I certify that inpatient services furnished can reasonably be expected to improve the patient's condition.    Hildred Priest, MD 12/19/201712:05 PM

## 2016-09-24 NOTE — [facility] Group Notes (Signed)
Goals Group  Date/Time: 09/24/2016  9:00AM  Type of Therapy and Topic: Group Therapy: Goals Group: SMART Goals  ?  Participation Level: Moderate  ?  Description of Group:  ?  The purpose of a daily goals group is to assist and guide patients in setting recovery/wellness-related goals. The objective is to set goals as they relate to the crisis in which they were admitted. Patients will be using SMART goal modalities to set measurable goals. Characteristics of realistic goals will be discussed and patients will be assisted in setting and processing how one will reach their goal. Facilitator will also assist patients in applying interventions and coping skills learned in psycho-education groups to the SMART goal and process how one will achieve defined goal.  ?  Therapeutic Goals:  ?  -Patients will develop and document one goal related to or their crisis in which brought them into treatment.  -Patients will be guided by LCSW using SMART goal setting modality in how to set a measurable, attainable, realistic and time sensitive goal.  -Patients will process barriers in reaching goal.  -Patients will process interventions in how to overcome and successful in reaching goal.  ?  Patient's Goal: Patient goal for today is to discharge and utilize resources to help "save souls." Pt stated to accomplish this goal, he will meet with his care team. ?  Therapeutic Modalities:  Motivational Interviewing  Cognitive Behavioral Therapy  Crisis Intervention Model  SMART goals setting  Glorious Peach, MSW, LCSW-A 09/24/2016, 10:20AM

## 2016-09-24 NOTE — Progress Notes (Signed)
D: Patient manic  With pressure  Speech. Moving fast  All over the unit. Patient has religion  Preoccupations . Period of vomiting on unit  Food chunks  Attending unit  Programing . Disorganized thinking  A. Stated no Depression scale , Denies suicidal  homicidal ideations  .  No auditory hallucinations  No pain concerns . Appropriate ADL'S. Interacting with peers and staff.  A: Encourage patient participation with unit programming . Instruction  Given on  Medication , verbalize understanding. R: Voice no other concerns. Staff continue to monitor

## 2016-09-24 NOTE — Progress Notes (Signed)
NUTRITION ASSESSMENT  Pt identified as at risk on the Malnutrition Screen Tool  INTERVENTION: 1. Educated patient on the importance of nutrition and encouraged intake of food and beverages. 2. Discussed weight goals. 3. Supplements: Ensure Enlive po BID, each supplement provides 350 kcal and 20 grams of protein  NUTRITION DIAGNOSIS: Unintentional weight loss related to sub-optimal intake as evidenced by pt report.   Goal: Pt to meet >/= 90% of their estimated nutrition needs.  Monitor:  PO intake  Assessment:  Bradley Bowers is a  70 y.o. male  hx of COPD, GERD, Depression here presenting with mania, hallucinations. Patient has been depressed for the last several weeks. Patient recently had a court case against him regarding child pornography. He has been depressed over this. He denies poor PO intake. He admits to intentional weight loss when his PCP told him to lose weight last year. He also reports a heart attack that required him to lose weight. Documented meal completion 100% No concerns at this point.  Height: Ht Readings from Last 1 Encounters:  09/23/16 5\' 6"  (1.676 m)    Weight: Wt Readings from Last 1 Encounters:  09/23/16 167 lb (75.8 kg)    Weight Hx: Wt Readings from Last 10 Encounters:  09/23/16 167 lb (75.8 kg)  09/23/16 172 lb (78 kg)  02/20/16 183 lb 9.6 oz (83.3 kg)  11/06/15 186 lb 4.8 oz (84.5 kg)    BMI:  Body mass index is 26.95 kg/m. Pt meets criteria for overweight based on current BMI.  Estimated Nutritional Needs: Kcal: 25-30 kcal/kg Protein: > 1 gram protein/kg Fluid: 1 ml/kcal  Diet Order: Diet 2 gram sodium Room service appropriate? Yes; Fluid consistency: Thin Pt is also offered choice of unit snacks mid-morning and mid-afternoon.  Pt is eating as desired.   Lab results and medications reviewed.   Satira Anis. Bradley Coots, MS, RD LDN Inpatient Clinical Dietitian Pager [phone]

## 2016-09-24 NOTE — Progress Notes (Signed)
Recreation Therapy Notes  Date: 12.19.17 Time: 9:30 am Location: Craft Room  Group Topic: Goal Setting  Goal Area(s) Addresses:  Patient will identify a goal. Patient will identify supportive statements to help achieve goal.  Behavioral Response: Did not attend  Intervention: Step By Step  Activity: Patients were given a worksheet with the outline of a foot on it and were instructed to write their goal inside the foot and write supportive statements outside the foot.  Education: LRT educated patients on ways to celebrate reaching their goals.  Education Outcome: Patient did not attend group.   Clinical Observations/Feedback: Patient did not attend group.  Leonette Monarch, LRT/CTRS 09/24/2016 10:05 AM

## 2016-09-25 ENCOUNTER — Inpatient Hospital Stay: Payer: PPO

## 2016-09-25 LAB — HEMOGLOBIN A1C
HEMOGLOBIN A1C: 5.2 % (ref 4.8–5.6)
MEAN PLASMA GLUCOSE: 103 mg/dL

## 2016-09-25 LAB — PROLACTIN: Prolactin: 65.6 ng/mL — ABNORMAL HIGH (ref 4.0–15.2)

## 2016-09-25 LAB — RPR: RPR: NONREACTIVE

## 2016-09-25 MED ORDER — CYANOCOBALAMIN 1000 MCG/ML IJ SOLN
1000.0000 ug | Freq: Every day | INTRAMUSCULAR | Status: AC
  Administered 2016-09-25 – 2016-10-01 (×7): 1000 ug via SUBCUTANEOUS
  Filled 2016-09-25 (×7): qty 1

## 2016-09-25 MED ORDER — OLANZAPINE 5 MG PO TABS
5.0000 mg | ORAL_TABLET | Freq: Two times a day (BID) | ORAL | Status: DC
Start: 2016-09-25 — End: 2016-10-06
  Administered 2016-09-25 – 2016-10-06 (×23): 5 mg via ORAL
  Filled 2016-09-25 (×23): qty 1

## 2016-09-25 MED ORDER — GADOBENATE DIMEGLUMINE 529 MG/ML IV SOLN
16.0000 mL | Freq: Once | INTRAVENOUS | Status: AC | PRN
  Administered 2016-09-25: 16 mL via INTRAVENOUS

## 2016-09-25 NOTE — Progress Notes (Signed)
Recreation Therapy Notes  Date: 12.20.17 Time: 9:30 am Location: Craft Room  Group Topic: Self-esteem  Goal Area(s) Addresses:  Patient will write at least one positive trait. Patient will verbalize benefit of having healthy self-esteem.  Behavioral Response: Disruptive  Intervention: I Am  Activity: Patients were given a worksheet with the letter I on it and were instructed to write as many positive traits inside the letter.  Education: LRT educated patients on ways they can increase their self-esteem.  Education Outcome: Patient walked in and out of group.  Clinical Observations/Feedback: Patient arrived to group at approximately 9:37 am and asked where the TV was. LRT informed patient the TV was in the dayroom. Patient wanted to be directed there. LRT told patient the dayroom was locked until group was over. Patient stated that he did not realize it was wrong for him to have child pornography on his phone and that he follows God's law. LRT asked if patient was staying in group or leaving. Patient stated he just wanted to talk to LRT. LRT informed patient it was time to be quiet for group. Patient apologized and left stating he was walking in [place]' shoes.  Leonette Monarch, LRT/CTRS 09/25/2016 10:05 AM

## 2016-09-25 NOTE — [facility] Suicide Risk Assessment (Signed)
CSW attempted to call patient's wife at number provided by the pt to the CSW at ph: [phone] at at the number on the pt;s face sheet for the pt's wife melecio wyly at ph: [phone] and the number for each said phone was no longer in service. CSW completed SPE with the pt.    Bradley Bowers. Bradley Bowers, LCSWA, LCAS  09/25/16

## 2016-09-25 NOTE — Progress Notes (Signed)
September 26, 2016.  Patient Identification: Bradley Bowers MRN:  [id number] Date of Evaluation:  09/24/2016 Chief Complaint:  Bipolar disorder Principal Diagnosis: Bipolar affective disorder, current episode manic with psychotic symptoms [facility])  To [place]:  Patient is a 70 year old married Caucasian male who was brought in by his outpatient psychiatrist to our emergency department on December 18.    In August of this year, the patient was charged with child pornography after being found with pictures of minors in his cell phone. Patient has a court hearing for his charges in January. In September, he was started on Prozac by his primary care provider and was referred to follow-up with Dr. [name] (outpatient psychiatrist). At some point in time the patient was switched from fluoxetine to sertraline which was increased from 50 mg to 100 mg.   The patient had a follow-up with Dr. [name] on 12/18 and he was found to be hyperreligious, disorganized, and was voicing suicidal ideation.   Per nursing notes in the ER "Pt states it's all because of the computer and he would like to go back in time and shoot whoever invented the computer. Pt states he was not suicidal until he got here. Pt states he now thinks he should kill himself and take seven or eight others with him."   During examination today the patient kept insisting on asking me if I was safe. Patient is a limited historian. He is very tangential. He displays evidence of psychomotor agitation, poor attention and poor concentration pressure speech. There is some grandiosity. Patient talks about his IQ score and his SAT scores. Patient reports talking to God in prayer and hearing the voice of God being his head. He also hears the voice of the devil that tells him to get on on Facebook. Patient denies having problems with his mood, appetite, energy or sleep. He denies suicidality or homicidality today.  Patient has been is started on  antipsychotics but he is not yet improve.    Patient is not yet stable for discharge and we are recommending to extend her/his involuntary commitment for up to 30 days.  If more information is needed about this case, please do not hesitated to contact me at [phone].  Sincerely,  Merlyn Albert M.D. [phone] [facility]/Behavioral health Unit

## 2016-09-25 NOTE — BHH Group Notes (Signed)
  [place] LCSW Group Therapy Note  Date/Time:12/20, 1pm  Type of Therapy/Topic:  Group Therapy:  Emotion Regulation  Participation Level:  Disruptive Mood: Manic  Description of Group:    The purpose of this group is to assist patients in learning to regulate negative emotions and experience positive emotions. Patients will be guided to discuss ways in which they have been vulnerable to their negative emotions. These vulnerabilities will be juxtaposed with experiences of positive emotions or situations, and patients challenged to use positive emotions to combat negative ones. Special emphasis will be placed on coping with negative emotions in conflict situations, and patients will process healthy conflict resolution skills.  Therapeutic Goals: 1. Patient will identify two positive emotions or experiences to reflect on in order to balance out negative emotions:  2. Patient will label two or more emotions that they find the most difficult to experience:  3. Patient will be able to demonstrate positive conflict resolution skills through discussion or role plays:   Summary of Patient Progress:  Pt unable to participate in groups and CSW would recommend not inviting to group until less tangential and more redirectable.  Pt irritable and visibily angry and cussing at this CSW when redirected from talking about anything off topic.    Therapeutic Modalities:   Cognitive Behavioral Therapy Feelings Identification Dialectical Behavioral Therapy  Dossie Arbour, LCSW

## 2016-09-25 NOTE — Progress Notes (Signed)
D:Periods of sleeping in room this shift. Continue to have Psychosis / Alter Though Disorganized    . Unable to find his room at times . Continue to talk rapid. Religious preoccupation . Stated  " You wouldn't be live who's coming tonight to visit me "   Compliant  with unit programing  And  Medication .  Denies suicidal  homicidal ideations  .  No auditory hallucinations  No pain concerns . Appropriate ADL'S. Interacting with peers and staff.  Appetite good  Voice no concerns around sleep A: Encourage patient participation with unit programming . Instruction  Given on  Medication , verbalize understanding. R: Voice no other concerns. Staff continue to monitor

## 2016-09-25 NOTE — Plan of Care (Signed)
Problem: Coping: Goal: Ability to identify and develop effective coping behavior will improve Outcome: Progressing Working on Scientific laboratory technician , hand-out given

## 2016-09-25 NOTE — Plan of Care (Signed)
Problem: Pain Managment: Goal: General experience of comfort will improve Outcome: Progressing Patient denies pain at this time   

## 2016-09-25 NOTE — Tx Team (Signed)
Interdisciplinary Treatment and Diagnostic Plan Update  09/25/2016 Time of Session: 10:30am Bradley Bowers MRN: [id number]  Principal Diagnosis: Bipolar affective disorder, current episode manic with psychotic symptoms ([place])  Secondary Diagnoses: Principal Problem:   Bipolar affective disorder, current episode manic with psychotic symptoms ([place]) Active Problems:   Chronic obstructive pulmonary disease ([place])   Gastro-esophageal reflux disease without esophagitis   Obstructive apnea   Restless leg   Coronary artery disease   Glaucoma   Current Medications:  Current Facility-Administered Medications  Medication Dose Route Frequency Provider Last Rate Last Dose  . acetaminophen (TYLENOL) tablet 650 mg  650 mg Oral Q6H PRN Gonzella Lex, MD      . albuterol (PROVENTIL HFA;VENTOLIN HFA) 108 (90 Base) MCG/ACT inhaler 2 puff  2 puff Inhalation Q4H PRN Gonzella Lex, MD      . alum & mag hydroxide-simeth (MAALOX/MYLANTA) 200-200-20 MG/5ML suspension 30 mL  30 mL Oral Q4H PRN Gonzella Lex, MD      . aspirin EC tablet 81 mg  81 mg Oral Daily Gonzella Lex, MD   81 mg at 09/25/16 0805  . atenolol (TENORMIN) tablet 50 mg  50 mg Oral Daily Gonzella Lex, MD   50 mg at 09/25/16 0805  . atorvastatin (LIPITOR) tablet 80 mg  80 mg Oral q1800 Gonzella Lex, MD   80 mg at 09/24/16 1726  . cyanocobalamin ((VITAMIN B-12)) injection 1,000 mcg  1,000 mcg Subcutaneous Daily Hildred Priest, MD      . feeding supplement (ENSURE ENLIVE) (ENSURE ENLIVE) liquid 237 mL  237 mL Oral BID BM Hildred Priest, MD      . fluticasone (FLONASE) 50 MCG/ACT nasal spray 1 spray  1 spray Each Nare Daily PRN Hildred Priest, MD      . gabapentin (NEURONTIN) capsule 300 mg  300 mg Oral QHS Gonzella Lex, MD   300 mg at 09/24/16 2141  . latanoprost (XALATAN) 0.005 % ophthalmic solution 1 drop  1 drop Both Eyes QHS Hildred Priest, MD   1 drop at 09/24/16 2143  . lisinopril  (PRINIVIL,ZESTRIL) tablet 5 mg  5 mg Oral Daily Gonzella Lex, MD   5 mg at 09/25/16 [id number]  . LORazepam (ATIVAN) tablet 1 mg  1 mg Oral QHS Hildred Priest, MD   1 mg at 09/24/16 2141  . LORazepam (ATIVAN) tablet 1 mg  1 mg Oral Q6H PRN Hildred Priest, MD   1 mg at 09/25/16 0940  . magnesium hydroxide (MILK OF MAGNESIA) suspension 30 mL  30 mL Oral Daily PRN Gonzella Lex, MD      . mometasone-formoterol [facility]) 200-5 MCG/ACT inhaler 2 puff  2 puff Inhalation BID Gonzella Lex, MD   2 puff at 09/25/16 0808  . OLANZapine (ZYPREXA) tablet 10 mg  10 mg Oral QHS Gonzella Lex, MD   10 mg at 09/24/16 2142  . OLANZapine (ZYPREXA) tablet 5 mg  5 mg Oral Daily Hildred Priest, MD   5 mg at 09/25/16 0805  . ondansetron (ZOFRAN-ODT) disintegrating tablet 8 mg  8 mg Oral Q8H PRN Hildred Priest, MD      . pantoprazole (PROTONIX) EC tablet 40 mg  40 mg Oral Daily Gonzella Lex, MD   40 mg at 09/25/16 0805  . ticagrelor (BRILINTA) tablet 90 mg  90 mg Oral BID Gonzella Lex, MD   90 mg at 09/25/16 [id number]  . timolol (TIMOPTIC) 0.5 % ophthalmic solution 1 drop  1  drop Both Eyes Daily Hildred Priest, MD   1 drop at 09/25/16 0809  . tiotropium (SPIRIVA) inhalation capsule 18 mcg  18 mcg Inhalation Daily Hildred Priest, MD   18 mcg at 09/25/16 0808   PTA Medications: Prescriptions Prior to Admission  Medication Sig Dispense Refill Last Dose  . albuterol (PROAIR HFA) 108 (90 Base) MCG/ACT inhaler Inhale into the lungs.   Taking  . aspirin 81 MG EC tablet TAKE 1 TABLET (81 MG TOTAL) BY MOUTH ONCE DAILY.  11 Taking  . atenolol (TENORMIN) 50 MG tablet TAKE 1 TABLET (50 MG TOTAL) BY MOUTH ONCE DAILY.  1 Taking  . atorvastatin (LIPITOR) 80 MG tablet TAKE 1 TABLET (80 MG TOTAL) BY MOUTH ONCE DAILY.  1 Taking  . Calcium-Magnesium-Vitamin D (CALCIUM+D3 GRADUAL RELEASE) 600-40-500 MG-MG-UNIT TB24 Take by mouth.   Taking  . fluticasone (FLONASE) 50 MCG/ACT  nasal spray 1 spray by Each Nare route daily.   Taking  . Fluticasone-Salmeterol (ADVAIR DISKUS) 250-50 MCG/DOSE AEPB Inhale into the lungs.   Taking  . gabapentin (NEURONTIN) 300 MG capsule Take 300 mg by mouth.   Taking  . lisinopril (PRINIVIL,ZESTRIL) 5 MG tablet TAKE 1 TABLET (5 MG TOTAL) BY MOUTH ONCE DAILY.  11 Taking  . methocarbamol (ROBAXIN) 500 MG tablet Take 500 mg by mouth 3 (three) times daily as needed.  1 Taking  . omeprazole (PRILOSEC) 40 MG capsule TAKE 1 CAPSULE (40 MG TOTAL) BY MOUTH 2 (TWO) TIMES DAILY.  3 Taking  . ticagrelor (BRILINTA) 90 MG TABS tablet Take by mouth.   Taking  . timolol (TIMOPTIC) 0.5 % ophthalmic solution USE 1 DROP(S) IN BOTH EYES ONCE A DAY  5 Taking  . tiotropium (SPIRIVA) 18 MCG inhalation capsule Place into inhaler and inhale.   Taking  . Travoprost, BAK Free, (TRAVATAN Z) 0.004 % SOLN ophthalmic solution Frequency:PHARMDIR   Dosage:0.0     Instructions:  Note:1 gtt in each eye q pm Dose: 1   Taking    Patient Stressors: Financial difficulties Health problems Legal issue  Patient Strengths: General fund of knowledge Supportive family/friends  Treatment Modalities: Medication Management, Group therapy, Case management,  1 to 1 session with clinician, Psychoeducation, Recreational therapy.   Physician Treatment Plan for Primary Diagnosis: Bipolar affective disorder, current episode manic with psychotic symptoms ([place]) Long Term Goal(s): Improvement in symptoms so as ready for discharge Improvement in symptoms so as ready for discharge   Short Term Goals: Ability to verbalize feelings will improve Ability to disclose and discuss suicidal ideas Ability to demonstrate self-control will improve Ability to identify and develop effective coping behaviors will improve Ability to identify changes in lifestyle to reduce recurrence of condition will improve Ability to demonstrate self-control will improve Ability to identify and develop effective  coping behaviors will improve Ability to identify triggers associated with substance abuse/mental health issues will improve  Medication Management: Evaluate patient's response, side effects, and tolerance of medication regimen.  Therapeutic Interventions: 1 to 1 sessions, Unit Group sessions and Medication administration.  Evaluation of Outcomes: Progressing  Physician Treatment Plan for Secondary Diagnosis: Principal Problem:   Bipolar affective disorder, current episode manic with psychotic symptoms ([place]) Active Problems:   Chronic obstructive pulmonary disease ([place])   Gastro-esophageal reflux disease without esophagitis   Obstructive apnea   Restless leg   Coronary artery disease   Glaucoma  Long Term Goal(s): Improvement in symptoms so as ready for discharge Improvement in symptoms so as ready for discharge   Short  Term Goals: Ability to verbalize feelings will improve Ability to disclose and discuss suicidal ideas Ability to demonstrate self-control will improve Ability to identify and develop effective coping behaviors will improve Ability to identify changes in lifestyle to reduce recurrence of condition will improve Ability to demonstrate self-control will improve Ability to identify and develop effective coping behaviors will improve Ability to identify triggers associated with substance abuse/mental health issues will improve     Medication Management: Evaluate patient's response, side effects, and tolerance of medication regimen.  Therapeutic Interventions: 1 to 1 sessions, Unit Group sessions and Medication administration.  Evaluation of Outcomes: Progressing   RN Treatment Plan for Primary Diagnosis: Bipolar affective disorder, current episode manic with psychotic symptoms ([place]) Long Term Goal(s): Knowledge of disease and therapeutic regimen to maintain health will improve  Short Term Goals: Ability to participate in decision making will improve, Ability to  identify and develop effective coping behaviors will improve and Compliance with prescribed medications will improve  Medication Management: RN will administer medications as ordered by provider, will assess and evaluate patient's response and provide education to patient for prescribed medication. RN will report any adverse and/or side effects to prescribing provider.  Therapeutic Interventions: 1 on 1 counseling sessions, Psychoeducation, Medication administration, Evaluate responses to treatment, Monitor vital signs and CBGs as ordered, Perform/monitor CIWA, COWS, AIMS and Fall Risk screenings as ordered, Perform wound care treatments as ordered.  Evaluation of Outcomes: Progressing   LCSW Treatment Plan for Primary Diagnosis: Bipolar affective disorder, current episode manic with psychotic symptoms ([place]) Long Term Goal(s): Safe transition to appropriate next level of care at discharge, Engage patient in therapeutic group addressing interpersonal concerns.  Short Term Goals: Engage patient in aftercare planning with referrals and resources, Increase social support, Increase ability to appropriately verbalize feelings, Increase emotional regulation, Facilitate acceptance of mental health diagnosis and concerns and Increase skills for wellness and recovery  Therapeutic Interventions: Assess for all discharge needs, 1 to 1 time with Social worker, Explore available resources and support systems, Assess for adequacy in community support network, Educate family and significant other(s) on suicide prevention, Complete Psychosocial Assessment, Interpersonal group therapy.  Evaluation of Outcomes: Progressing   Progress in Treatment: Attending groups: Yes. Participating in groups: Yes. Taking medication as prescribed: Yes. Toleration medication: Yes. Family/Significant other contact made: No, will contact:  pt's family Patient understands diagnosis: Yes. Discussing patient identified  problems/goals with staff: Yes. Medical problems stabilized or resolved: Yes. Denies suicidal/homicidal ideation: Yes. Issues/concerns per patient self-inventory: Yes. Other: none  New problem(s) identified: No, Describe:  none listed   New Short Term/Long Term Goal(s):  Discharge Plan or Barriers: CSW still assessing for appropriate referrals  Reason for Continuation of Hospitalization: Anxiety Delusions  Depression Mania Suicidal ideation  Estimated Length of Stay:  Attendees: Patient: Bradley Bowers 09/25/2016 10:46 AM  Physician: Dr. Jerilee Hoh, MD 09/25/2016 10:46 AM  Nursing: Carolynn Sayers, R 09/25/2016 10:46 AM  RN Care Manager: 09/25/2016 10:46 AM  Social Worker: Alphonse Guild. Daine Gravel, LCAS   09/25/2016 10:46 AM  Recreational Therapist: Everitt Amber, LRT 09/25/2016 10:46 AM  Other:  09/25/2016 10:46 AM  Other:  09/25/2016 10:46 AM  Other: 09/25/2016 10:46 AM    Scribe for Treatment Team: Alphonse Guild Wagner Tanzi, LCSWA 09/25/2016

## 2016-09-25 NOTE — Progress Notes (Signed)
D: Patient still very hyperverbal. States he's waiting for his visitors, his wife and the sheriff [place]. Preoccupied with letting us know he's not crazy. Denies SI/HI/AVH. Intrusive. Mildly confused.  A: Medication given with education. Encouragement provided.  R: Patient was compliant with medication. He remains calm and cooperative. Safety maintained with 15 min checks.

## 2016-09-25 NOTE — BHH Group Notes (Signed)
[place] Group Notes:  (Nursing/MHT/Case Management/Adjunct)  Date:  09/25/2016  Time:  4:35 PM  Type of Therapy:  Psychoeducational Skills  Participation Level:  Did Not Attend  Charise Killian 09/25/2016, 4:35 PM

## 2016-09-25 NOTE — Progress Notes (Signed)
[facility] MD Progress Note  09/25/2016 8:46 AM Bradley Bowers  MRN:  [id number] Subjective:  Patient is a 70 year old married Caucasian male who was brought in by his outpatient psychiatrist to our emergency department on December 18.   In August of this year the patient was charged with child pornography after being found with pictures of minors in his cell phone. Patient has a court hearing for his charges in January. In September he was started on Prozac by his primary care provider and was referred to follow-up with Dr. [name] (outpatient psychiatrist). At some point in time the patient was switched from fluoxetine to sertraline which was increased from 50 mg to 100 mg.  The patient had a follow-up with Dr. [name] yesterday and he was found to be hyperreligious, disorganized, and was voicing suicidal ideation.  Per nursing notes in the ER "Pt states it's all because of the computer and he would like to go back in time and shoot whoever invented the computer. Pt states he was not suicidal until he got here. Pt states he now thinks he should kill himself and take seven or eight others with him."   12/20 patient reports his doing "excellent". He denies problems with mood, appetite, energy, sleep or concentration. Patient says deny he is going to be visited by his wife and the sheriff who is going to get him out of this mess. Patient has pressured speech, psychomotor agitation, requires significant redirection as he does not abstain topic, although his answers are tangential. Per nursing is that he has been eating well. He is slept 6 hours last night and has been compliant with medications.  Per nursing: D: Patient still very hyperverbal. States he's waiting for his visitors, his wife and the sheriff Elray B[place]. Preoccupied with letting us know he's not crazy. Denies SI/HI/AVH. Intrusive. Mildly confused.  A: Medication given with education. Encouragement provided.  R: Patient was compliant with  medication. He remains calm and cooperative. Safety maintained with 15 min checks.   Principal Problem: Bipolar affective disorder, current episode manic with psychotic symptoms ([place]) Diagnosis:   Patient Active Problem List   Diagnosis Date Noted  . Coronary artery disease [I25.10] 09/24/2016  . Glaucoma [H40.9] 09/24/2016  . Bipolar affective disorder, current episode manic with psychotic symptoms ([place]) [F31.2] 09/23/2016  . Chronic obstructive pulmonary disease ([place]) [J44.9] 11/06/2015  . Gastro-esophageal reflux disease without esophagitis [K21.9] 10/24/2014  . Obstructive apnea [G47.33] 10/24/2014  . Restless leg [G25.81] 10/24/2014   Total Time spent with patient: 30 minutes  Past Psychiatric History: Patient denies any prior history of mental illness prior to summer of this year . Patient denies history of psychiatric hospitalizations or suicidal attempts   Past Medical History:  Past Medical History:  Diagnosis Date  . Arterial vascular disease 10/24/2014   Overview:  MI 1994, small-vessel disease requiring coumadin   . Asthma   . Centrilobular emphysema ([place]) 10/24/2014   Overview:  2L O2 prn   . Chronic obstructive pulmonary disease ([place]) 11/06/2015  . GERD (gastroesophageal reflux disease)   . Glaucoma   . Heart attack 2/17, 1994  . Heart disease   . Hyperlipemia   . Hypertension   . Restless leg 10/24/2014  . Sleep apnea     Past Surgical History:  Procedure Laterality Date  . CATARACT EXTRACTION W/ INTRAOCULAR LENS  IMPLANT, BILATERAL     Family History:  Family History  Problem Relation Age of Onset  . Nephrolithiasis Father   .  Prostate cancer Neg Hx   . Bladder Cancer Neg Hx   . Kidney cancer Neg Hx    Family Psychiatric  History: Patient reports his brother died of liver disease related to alcoholism. He denies any history of mental illness in his family or suicides.   Social History: Patient lives with his wife. He has been married for 29 year. They do  not have any children. Patient Retired from work in the [employer]. He has some college education. He does not have any military experience. As far as his legal history his raises serious charges related to child pornography  History  Alcohol Use No     History  Drug Use No    Social History   Social History  . Marital status: Married    Spouse name: N/A  . Number of children: N/A  . Years of education: N/A   Social History Main Topics  . Smoking status: Former Research scientist (life sciences)  . Smokeless tobacco: Never Used  . Alcohol use No  . Drug use: No  . Sexual activity: Not Asked   Other Topics Concern  . None   Social History Narrative  . None    Current Medications: Current Facility-Administered Medications  Medication Dose Route Frequency Provider Last Rate Last Dose  . acetaminophen (TYLENOL) tablet 650 mg  650 mg Oral Q6H PRN Gonzella Lex, MD      . albuterol (PROVENTIL HFA;VENTOLIN HFA) 108 (90 Base) MCG/ACT inhaler 2 puff  2 puff Inhalation Q4H PRN Gonzella Lex, MD      . alum & mag hydroxide-simeth (MAALOX/MYLANTA) 200-200-20 MG/5ML suspension 30 mL  30 mL Oral Q4H PRN Gonzella Lex, MD      . aspirin EC tablet 81 mg  81 mg Oral Daily Gonzella Lex, MD   81 mg at 09/25/16 0805  . atenolol (TENORMIN) tablet 50 mg  50 mg Oral Daily Gonzella Lex, MD   50 mg at 09/25/16 0805  . atorvastatin (LIPITOR) tablet 80 mg  80 mg Oral q1800 Gonzella Lex, MD   80 mg at 09/24/16 1726  . feeding supplement (ENSURE ENLIVE) (ENSURE ENLIVE) liquid 237 mL  237 mL Oral BID BM Hildred Priest, MD      . fluticasone (FLONASE) 50 MCG/ACT nasal spray 1 spray  1 spray Each Nare Daily PRN Hildred Priest, MD      . gabapentin (NEURONTIN) capsule 300 mg  300 mg Oral QHS Gonzella Lex, MD   300 mg at 09/24/16 2141  . latanoprost (XALATAN) 0.005 % ophthalmic solution 1 drop  1 drop Both Eyes QHS Hildred Priest, MD   1 drop at 09/24/16 2143  . lisinopril  (PRINIVIL,ZESTRIL) tablet 5 mg  5 mg Oral Daily Gonzella Lex, MD   5 mg at 09/25/16 [id number]  . LORazepam (ATIVAN) tablet 1 mg  1 mg Oral QHS Hildred Priest, MD   1 mg at 09/24/16 2141  . LORazepam (ATIVAN) tablet 1 mg  1 mg Oral Q6H PRN Hildred Priest, MD      . magnesium hydroxide (MILK OF MAGNESIA) suspension 30 mL  30 mL Oral Daily PRN Gonzella Lex, MD      . mometasone-formoterol [facility]) 200-5 MCG/ACT inhaler 2 puff  2 puff Inhalation BID Gonzella Lex, MD   2 puff at 09/25/16 0808  . OLANZapine (ZYPREXA) tablet 10 mg  10 mg Oral QHS Gonzella Lex, MD   10 mg at 09/24/16 2142  .  OLANZapine (ZYPREXA) tablet 5 mg  5 mg Oral Daily Hildred Priest, MD   5 mg at 09/25/16 0805  . ondansetron (ZOFRAN-ODT) disintegrating tablet 8 mg  8 mg Oral Q8H PRN Hildred Priest, MD      . pantoprazole (PROTONIX) EC tablet 40 mg  40 mg Oral Daily Gonzella Lex, MD   40 mg at 09/25/16 0805  . ticagrelor (BRILINTA) tablet 90 mg  90 mg Oral BID Gonzella Lex, MD   90 mg at 09/25/16 [id number]  . timolol (TIMOPTIC) 0.5 % ophthalmic solution 1 drop  1 drop Both Eyes Daily Hildred Priest, MD   1 drop at 09/25/16 0809  . tiotropium (SPIRIVA) inhalation capsule 18 mcg  18 mcg Inhalation Daily Hildred Priest, MD   18 mcg at 09/25/16 0808    Lab Results:  Results for orders placed or performed during the hospital encounter of 09/23/16 (from the past 48 hour(s))  Hemoglobin A1c     Status: None   Collection Time: 09/24/16  7:04 AM  Result Value Ref Range   Hgb A1c MFr Bld 5.2 4.8 - 5.6 %    Comment: (NOTE)         Pre-diabetes: 5.7 - 6.4         Diabetes: >6.4         Glycemic control for adults with diabetes: <7.0    Mean Plasma Glucose 103 mg/dL    Comment: (NOTE) Performed At: [facility] [place], [place] [id number] Lindon Romp MD [id number]   Lipid panel     Status: Abnormal   Collection Time: 09/24/16  7:04 AM   Result Value Ref Range   Cholesterol 177 0 - 200 mg/dL   Triglycerides 126 <150 mg/dL   HDL 41 >40 mg/dL   Total CHOL/HDL Ratio 4.3 RATIO   VLDL 25 0 - 40 mg/dL   LDL Cholesterol 111 (H) 0 - 99 mg/dL    Comment:        Total Cholesterol/HDL:CHD Risk Coronary Heart Disease Risk Table                     Men   Women  1/2 Average Risk   3.4   3.3  Average Risk       5.0   4.4  2 X Average Risk   9.6   7.1  3 X Average Risk  23.4   11.0        Use the calculated Patient Ratio above and the CHD Risk Table to determine the patient's CHD Risk.        ATP III CLASSIFICATION (LDL):  <100     mg/dL   Optimal  100-129  mg/dL   Near or Above                    Optimal  130-159  mg/dL   Borderline  160-189  mg/dL   High  >190     mg/dL   Very High   Prolactin     Status: Abnormal   Collection Time: 09/24/16  7:04 AM  Result Value Ref Range   Prolactin 65.6 (H) 4.0 - 15.2 ng/mL    Comment: (NOTE) Performed At: [facility] [place], [place] [id number] Lindon Romp MD [id number]   TSH     Status: None   Collection Time: 09/24/16  7:04 AM  Result Value Ref Range   TSH 1.886 0.350 - 4.500  uIU/mL    Comment: Performed by a 3rd Generation assay with a functional sensitivity of <=0.01 uIU/mL.  Rapid HIV screen (HIV 1/2 Ab+Ag)     Status: None   Collection Time: 09/24/16 10:28 AM  Result Value Ref Range   HIV-1 P24 Antigen - HIV24 NON REACTIVE NON REACTIVE   HIV 1/2 Antibodies NON REACTIVE NON REACTIVE   Interpretation (HIV Ag Ab)      A non reactive test result means that HIV 1 or HIV 2 antibodies and HIV 1 p24 antigen were not detected in the specimen.  RPR     Status: None   Collection Time: 09/24/16 10:28 AM  Result Value Ref Range   RPR Ser Ql Non Reactive Non Reactive    Comment: (NOTE) Performed At: [facility] [address] [place], [place] [id number] Lindon Romp MD [id number]   Ammonia     Status: None   Collection Time:  09/24/16 10:28 AM  Result Value Ref Range   Ammonia 22 9 - 35 umol/L  Vitamin B12     Status: None   Collection Time: 09/24/16 10:28 AM  Result Value Ref Range   Vitamin B-12 189 180 - 914 pg/mL    Comment: (NOTE) This assay is not validated for testing neonatal or myeloproliferative syndrome specimens for Vitamin B12 levels. Performed at [facility]   Urinalysis, Complete w Microscopic     Status: Abnormal   Collection Time: 09/24/16  7:01 PM  Result Value Ref Range   Color, Urine YELLOW (A) YELLOW   APPearance HAZY (A) CLEAR   Specific Gravity, Urine 1.018 1.005 - 1.030   pH 5.0 5.0 - 8.0   Glucose, UA NEGATIVE NEGATIVE mg/dL   Hgb urine dipstick MODERATE (A) NEGATIVE   Bilirubin Urine NEGATIVE NEGATIVE   Ketones, ur NEGATIVE NEGATIVE mg/dL   Protein, ur NEGATIVE NEGATIVE mg/dL   Nitrite NEGATIVE NEGATIVE   Leukocytes, UA NEGATIVE NEGATIVE   RBC / HPF 6-30 0 - 5 RBC/hpf   WBC, UA 0-5 0 - 5 WBC/hpf   Bacteria, UA NONE SEEN NONE SEEN   Squamous Epithelial / LPF 0-5 (A) NONE SEEN   Mucous PRESENT    Hyaline Casts, UA PRESENT     Blood Alcohol level:  Lab Results  Component Value Date   ETH <5 [id number]    Metabolic Disorder Labs: Lab Results  Component Value Date   HGBA1C 5.2 09/24/2016   MPG 103 09/24/2016   Lab Results  Component Value Date   PROLACTIN 65.6 (H) 09/24/2016   Lab Results  Component Value Date   CHOL 177 09/24/2016   TRIG 126 09/24/2016   HDL 41 09/24/2016   CHOLHDL 4.3 09/24/2016   VLDL 25 09/24/2016   LDLCALC 111 (H) 09/24/2016    Physical Findings: AIMS:  , ,  ,  , Dental Status Current problems with teeth and/or dentures?: No Does patient usually wear dentures?: No  CIWA:    COWS:     Musculoskeletal: Strength & Muscle Tone: within normal limits Gait & Station: normal Patient leans: N/A  Psychiatric Specialty Exam: Physical Exam  Constitutional: He is oriented to person, place, and time. He appears well-developed  and well-nourished.  HENT:  Head: Normocephalic and atraumatic.  Eyes: Conjunctivae and EOM are normal.  Neck: Normal range of motion.  Respiratory: Effort normal.  Musculoskeletal: Normal range of motion.  Neurological: He is alert and oriented to person, place, and time.    Review of Systems  Constitutional: Negative.   HENT: Negative.   Eyes: Negative.   Respiratory: Negative.   Cardiovascular: Negative.   Gastrointestinal: Negative.   Genitourinary: Negative.   Musculoskeletal: Negative.   Skin: Negative.   Neurological: Negative.   Endo/Heme/Allergies: Negative.   Psychiatric/Behavioral: Negative.     Blood pressure 131/84, pulse 64, temperature 97.8 F (36.6 C), temperature source Oral, resp. rate 18, height 5\' 6"  (1.676 m), weight 75.8 kg (167 lb), SpO2 100 %.Body mass index is 26.95 kg/m.  General Appearance: Disheveled  Eye Contact:  Good  Speech:  Pressured  Volume:  Normal  Mood:  Euphoric  Affect:  Labile  Thought Process:  Irrelevant and Descriptions of Associations: Tangential  Orientation:  Full (Time, Place, and Person)  Thought Content:  Hallucinations: None  Suicidal Thoughts:  No  Homicidal Thoughts:  No  Memory:  Immediate;   Poor Recent;   Poor Remote;   Poor  Judgement:  Impaired  Insight:  Lacking  Psychomotor Activity:  Increased  Concentration:  Concentration: Poor and Attention Span: Poor  Recall:  Poor  Fund of Knowledge:  Good  Language:  Good  Akathisia:  No  Handed:    AIMS (if indicated):     Assets:  Agricultural consultant Housing Social Support  ADL's:  Intact  Cognition:  Impaired,  Moderate  Sleep:  Number of Hours: 6.25     Treatment Plan Summary:  No improvement yet.  Patient is a 70 year old married Caucasian who is presenting with mania.   For bipolar disorder the patient has been started on olanzapine. Today I will increase the dose to 5 mg with breakfast, 5 mg with lunch and 10 mg  in the evening  For insomnia patient will be continue on ativan 1 mg qhs. Patient slept 6 hours last night  For hypertension: continued on lisinopril and atenolol. VS are stable  For cardiovascular disease continue Brilinta  For COPD he will be continued on albuterol, dulera and spiriva.  For dyslipidemia he will be continued on Lipitor  For GERD he will be continued on PPI  For glaucoma continue eyedrops  For restless leg syndrome continue Neurontin 300 mg daily at bedtime.  Vitamin B12 deficiency. His vitamin B12 is 189. I will restart him on vitamin B12 injections.  Labs: Ammonia was within the normal limits, cholesterol was within the normal limits, hemoglobin A1c was 5.2, TSH was within the normal limits, HIV was nonreactive, RPR was nonreactive, UA was clear. Urine toxicology was negative.   EKG shows sinus bradycardia with a QTC of 434.  Brain MRI is pending  Collateral information: I have reviewed records and notes from his primary care was started treating him for depression back in September. I contacted the patient's wife had [phone] her name is Zakkai Zappa. She reports patient started having manic symptoms on Saturday. He was talking nonstop. Wanting to deliver Christmas present to 7 AM to somebody's house on Sunday morning. He was becoming more agitated and confrontational with his wife on Sunday. She denies any prior history of symptoms like this. There is also no family history of mental illness that she is aware of.  Hildred Priest, MD 09/25/2016, 8:46 AM

## 2016-09-26 MED ORDER — DIVALPROEX SODIUM ER 500 MG PO TB24
1500.0000 mg | ORAL_TABLET | Freq: Every day | ORAL | Status: DC
  Administered 2016-09-26 – 2016-10-05 (×10): 1500 mg via ORAL
  Filled 2016-09-26 (×10): qty 3

## 2016-09-26 MED ORDER — LORAZEPAM 2 MG PO TABS
2.0000 mg | ORAL_TABLET | Freq: Every day | ORAL | Status: DC
  Administered 2016-09-26 – 2016-10-04 (×8): 2 mg via ORAL
  Filled 2016-09-26 (×8): qty 1

## 2016-09-26 NOTE — Progress Notes (Signed)
Recreation Therapy Notes  Date: 12.21.17 Time: 9:30 am Location: Craft Room  Group Topic: Coping Skills/Leisure Education  Goal Area(s) Addresses:  Patient will identify things they are grateful for. Patient will identify how being grateful can influence decision making.  Behavioral Response: Disrupttive  Intervention: Immunologist  Activity: Patients were given an I Am Grateful For worksheet and were instructed to write things they are grateful for under each category.  Education: LRT educated patients on why it is important to be grateful.  Education Outcome: In group clarification offered  Clinical Observations/Feedback: Patient circled his worksheet and wrote on it. Patient tried to help peer with spelling, peer did not want patient's help. LRT helped peer and redirected patient. Patient attempted to contribute to group discussion. Patient talked to God during group and talked about a [place] dropping a bomb on the [place]. LRT redirected patient multiple times. Patient difficult to redirect.  Leonette Monarch, LRT/CTRS 09/26/2016 10:18 AM

## 2016-09-26 NOTE — BHH Group Notes (Signed)
[facility] Group Notes:  (Nursing/MHT/Case Management/Adjunct)  Date:  09/26/2016  Time:  7:12 AM  Type of Therapy:  Group Therapy  Participation Level:  Active  Participation Quality:  Monopolizing and Resistant  Affect:  Excited  Cognitive:  Disorganized  Insight:  Lacking  Engagement in Group:  Monopolizing and Off Topic  Modes of Intervention:  Discussion  Summary of Progress/Problems: When pt was asked what his goal was he said "World peace because I am Jesus himself." As staff attempted to redirect pt and become more realistically goal focused. Pt said to staff "God give me the will to make her shut up." Staff allowed pt to finish his thought and advised nurse of seemingly increasingly irritable mood.   Jenetta Downer Verlia Kaney 09/26/2016, 7:12 AM

## 2016-09-26 NOTE — BHH Group Notes (Signed)
Goals Group  Date/Time: 09/26/2016  9:00AM Type of Therapy and Topic: Group Therapy: Goals Group: SMART Goals  ?  Participation Level: Moderate  ?  Description of Group:  ?  The purpose of a daily goals group is to assist and guide patients in setting recovery/wellness-related goals. The objective is to set goals as they relate to the crisis in which they were admitted. Patients will be using SMART goal modalities to set measurable goals. Characteristics of realistic goals will be discussed and patients will be assisted in setting and processing how one will reach their goal. Facilitator will also assist patients in applying interventions and coping skills learned in psycho-education groups to the SMART goal and process how one will achieve defined goal.  ?  Therapeutic Goals:  ?  -Patients will develop and document one goal related to or their crisis in which brought them into treatment.  -Patients will be guided by LCSW using SMART goal setting modality in how to set a measurable, attainable, realistic and time sensitive goal.  -Patients will process barriers in reaching goal.  -Patients will process interventions in how to overcome and successful in reaching goal.  ?  Patient's Goal: Pt stated his goal for today is "to go to heaven." He stated that in order to accomplish he will "die and go to heaven." ?  Therapeutic Modalities:  Motivational Interviewing  Cognitive Behavioral Therapy  Crisis Intervention Model  SMART goals setting  Glorious Peach, MSW, LCSW-A 09/26/2016, 10:26AM

## 2016-09-26 NOTE — BHH Group Notes (Addendum)
[facility] LCSW Group Therapy Note  Date/Time: 09/26/16 1300  Type of Therapy/Topic:  Group Therapy:  Balance in Life  Participation Level:  Active, redirectable today  Description of Group:    This group will address the concept of balance and how it feels and looks when one is unbalanced. Patients will be encouraged to process areas in their lives that are out of balance, and identify reasons for remaining unbalanced. Facilitators will guide patients utilizing problem- solving interventions to address and correct the stressor making their life unbalanced. Understanding and applying boundaries will be explored and addressed for obtaining  and maintaining a balanced life. Patients will be encouraged to explore ways to assertively make their unbalanced needs known to significant others in their lives, using other group members and facilitator for support and feedback.  Therapeutic Goals: 1. Patient will identify two or more emotions or situations they have that consume much of in their lives. 2. Patient will identify signs/triggers that life has become out of balance:  3. Patient will identify two ways to set boundaries in order to achieve balance in their lives:  4. Patient will demonstrate ability to communicate their needs through discussion and/or role plays  Summary of Patient Progress: Pt attended group and actively participated.  Pt continues to attempt to excessively talk, however, pt did respond appropriately to limit setting and redirection when prompted.  Pt identified the spiritual and intellectual areas in his life as being out of balance, but had a hard time verbalizing why he chose these areas.  Pt did not appear to be comprehending the material entirely.   Therapeutic Modalities:   Cognitive Behavioral Therapy Solution-Focused Therapy Assertiveness Training  Lurline Idol, [place]

## 2016-09-26 NOTE — Progress Notes (Signed)
   09/25/16 2015  Charting Type  Charting Type Shift assessment  Orders Chart Check (once per shift) Completed  Neurological  Neuro (WDL) WDL  HEENT  HEENT (WDL) WDL  Respiratory  Respiratory (WDL) WDL  Cardiac  Cardiac (WDL) WDL  Vascular  Vascular (WDL) WDL  Integumentary  Integumentary (WDL) X  Skin Color Other (Comment)  Skin Condition Dry;Flaky  Skin Integrity Skin tear  Skin Tear Location Arm  Skin Tear Location Orientation Left;Posterior;Proximal;Other (Comment) (Proximal to elbow)  Skin Tear Intervention Cleansed;Gauze (Area cleaned with Sterile NS, Wet to dry dressing applied)  Braden Scale (Ages 8 and up)  Sensory Perceptions 4  Moisture 4  Activity 4  Mobility 4  Nutrition 3  Friction and Shear 3  Braden Scale Score 22  Musculoskeletal  Musculoskeletal (WDL) WDL  Gastrointestinal  Gastrointestinal (WDL) WDL  GU Assessment  Genitourinary (WDL) WDL

## 2016-09-26 NOTE — Progress Notes (Signed)
Patient ID: ELRICK BENHAM, male   DOB: Mar 25, 1946, 70 y.o.   MRN: [id number] Mr Wehrman is delusional, preoccupied, hyper-religious with grandiosity, "I am Jesus Christ, I think like Him because he wants me to fix this world, you are guaranteed to go to [place] because I said so ..... I have to tell the truth, Time Traveling to the past, present and future; I will travel back in time and kill the guy that invented the computer, then come to the present and fix everybody before I can travel to the future unless I will go to hell.Marland KitchenMarland KitchenIf I kill myself, God will forgive me, but if I take 16 people with me, I think I will go to hell. I can't kill myself because God will send me to hell......" Patient continues with this dialogue, got up and scratched his left posterior proximal to elbow arm, area cleaned with Sterile NS, Wet to dry pressure dressing applied; Dr. Mamie Nick" was informed of the skin tear at 2320 when the 3rd dressing completed. Patient is on Aspirin EC tablet 81 mg and Brilinta 90 mg blood thinner; potential for injury/hemorrhage.

## 2016-09-26 NOTE — Progress Notes (Signed)
Patient with bright affect, cooperative with meals, meds and plan of care. No SI/HI at this time. Patient is hyper verbal, hyper religious. Talkative with peers. No distress, no discomfort. Safety maintained.

## 2016-09-26 NOTE — Progress Notes (Signed)
Spoke with pt's wife today and gave her an update. Discussed MRI, labs results and medications.  Also spoke with Dr [name] yesterday and today about the pt.  She also received an update.

## 2016-09-26 NOTE — Progress Notes (Signed)
[facility] MD Progress Note  09/26/2016 11:41 AM Bradley Bowers  MRN:  [id number] Subjective:  Patient is a 70 year old married Caucasian male who was brought in by his outpatient psychiatrist to our emergency department on December 18.   In August of this year the patient was charged with child pornography after being found with pictures of minors in his cell phone. Patient has a court hearing for his charges in January. In September he was started on Prozac by his primary care provider and was referred to follow-up with Dr. [name] (outpatient psychiatrist). At some point in time the patient was switched from fluoxetine to sertraline which was increased from 50 mg to 100 mg.  The patient had a follow-up with Dr. [name] yesterday and he was found to be hyperreligious, disorganized, and was voicing suicidal ideation.  Per nursing notes in the ER "Pt states it's all because of the computer and he would like to go back in time and shoot whoever invented the computer. Pt states he was not suicidal until he got here. Pt states he now thinks he should kill himself and take seven or eight others with him."   12/20 patient reports his doing "excellent". He denies problems with mood, appetite, energy, sleep or concentration. Patient says deny he is going to be visited by his wife and the sheriff who is going to get him out of this mess. Patient has pressured speech, psychomotor agitation, requires significant redirection as he does not abstain topic, although his answers are tangential. Per nursing is that he has been eating well. He is slept 6 hours last night and has been compliant with medications.  12/21 patient reports he is doing wonderful. Denies problems with his sleep, appetite, energy or concentration. He says that his concentration has never been better. He grades his energy has a 12 out of 10 x 10 being the best. He denies side effects from his medications or having any physical complaints. He denies  suicidality, homicidality or auditory or visual hallucinations. Patient and her conversation and spoke about time traveling, teaching small children about the assassination of [place]. Helping the sheriff get reelected. Banning  computers in the world and ending mass shootings.  Per nursing: Mr Schoeff is delusional, preoccupied, hyper-religious with grandiosity, "I am Jesus Christ, I think like Him because he wants me to fix this world, you are guaranteed to go to [place] because I said so ..... I have to tell the truth, Time Traveling to the past, present and future; I will travel back in time and kill the guy that invented the computer, then come to the present and fix everybody before I can travel to the future unless I will go to hell.Marland KitchenMarland KitchenIf I kill myself, God will forgive me, but if I take 52 people with me, I think I will go to hell. I can't kill myself because God will send me to hell......" Patient continues with this dialogue, got up and scratched his left posterior proximal to elbow arm, area cleaned with Sterile NS, Wet to dry pressure dressing applied; Dr. Mamie Nick" was informed of the skin tear at 2320 when the 3rd dressing completed. Patient is on Aspirin EC tablet 81 mg and Brilinta 90 mg blood thinner; potential for injury/hemorrhage.  Principal Problem: Bipolar affective disorder, current episode manic with psychotic symptoms ([place]) Diagnosis:   Patient Active Problem List   Diagnosis Date Noted  . Coronary artery disease [I25.10] 09/24/2016  . Glaucoma [H40.9] 09/24/2016  . Bipolar  affective disorder, current episode manic with psychotic symptoms ([place]) [F31.2] 09/23/2016  . Chronic obstructive pulmonary disease ([place]) [J44.9] 11/06/2015  . Gastro-esophageal reflux disease without esophagitis [K21.9] 10/24/2014  . Obstructive apnea [G47.33] 10/24/2014  . Restless leg [G25.81] 10/24/2014   Total Time spent with patient: 30 minutes  Past Psychiatric History: Patient denies any prior history of  mental illness prior to summer of this year . Patient denies history of psychiatric hospitalizations or suicidal attempts   Past Medical History:  Past Medical History:  Diagnosis Date  . Arterial vascular disease 10/24/2014   Overview:  MI 1994, small-vessel disease requiring coumadin   . Asthma   . Centrilobular emphysema ([place]) 10/24/2014   Overview:  2L O2 prn   . Chronic obstructive pulmonary disease ([place]) 11/06/2015  . GERD (gastroesophageal reflux disease)   . Glaucoma   . Heart attack 2/17, 1994  . Heart disease   . Hyperlipemia   . Hypertension   . Restless leg 10/24/2014  . Sleep apnea     Past Surgical History:  Procedure Laterality Date  . CATARACT EXTRACTION W/ INTRAOCULAR LENS  IMPLANT, BILATERAL     Family History:  Family History  Problem Relation Age of Onset  . Nephrolithiasis Father   . Prostate cancer Neg Hx   . Bladder Cancer Neg Hx   . Kidney cancer Neg Hx    Family Psychiatric  History: Patient reports his brother died of liver disease related to alcoholism. He denies any history of mental illness in his family or suicides.   Social History: Patient lives with his wife. He has been married for 29 year. They do not have any children. Patient Retired from work in the [employer]. He has some college education. He does not have any military experience. As far as his legal history his raises serious charges related to child pornography  History  Alcohol Use No     History  Drug Use No    Social History   Social History  . Marital status: Married    Spouse name: N/A  . Number of children: N/A  . Years of education: N/A   Social History Main Topics  . Smoking status: Former Research scientist (life sciences)  . Smokeless tobacco: Never Used  . Alcohol use No  . Drug use: No  . Sexual activity: Not Asked   Other Topics Concern  . None   Social History Narrative  . None    Current Medications: Current Facility-Administered Medications  Medication Dose Route Frequency  Provider Last Rate Last Dose  . acetaminophen (TYLENOL) tablet 650 mg  650 mg Oral Q6H PRN Gonzella Lex, MD      . albuterol (PROVENTIL HFA;VENTOLIN HFA) 108 (90 Base) MCG/ACT inhaler 2 puff  2 puff Inhalation Q4H PRN Gonzella Lex, MD      . alum & mag hydroxide-simeth (MAALOX/MYLANTA) 200-200-20 MG/5ML suspension 30 mL  30 mL Oral Q4H PRN Gonzella Lex, MD      . aspirin EC tablet 81 mg  81 mg Oral Daily Gonzella Lex, MD   81 mg at 09/26/16 [id number]  . atenolol (TENORMIN) tablet 50 mg  50 mg Oral Daily Gonzella Lex, MD   50 mg at 09/26/16 [id number]  . atorvastatin (LIPITOR) tablet 80 mg  80 mg Oral q1800 Gonzella Lex, MD   80 mg at 09/25/16 1700  . cyanocobalamin ((VITAMIN B-12)) injection 1,000 mcg  1,000 mcg Subcutaneous Daily Hildred Priest, MD   1,000 mcg at  09/26/16 [id number]  . divalproex (DEPAKOTE ER) 24 hr tablet 1,500 mg  1,500 mg Oral QHS Hildred Priest, MD      . feeding supplement (ENSURE ENLIVE) (ENSURE ENLIVE) liquid 237 mL  237 mL Oral BID BM Hildred Priest, MD   237 mL at 09/26/16 1000  . fluticasone (FLONASE) 50 MCG/ACT nasal spray 1 spray  1 spray Each Nare Daily PRN Hildred Priest, MD      . gabapentin (NEURONTIN) capsule 300 mg  300 mg Oral QHS Gonzella Lex, MD   300 mg at 09/25/16 2118  . latanoprost (XALATAN) 0.005 % ophthalmic solution 1 drop  1 drop Both Eyes QHS Hildred Priest, MD   1 drop at 09/25/16 2118  . lisinopril (PRINIVIL,ZESTRIL) tablet 5 mg  5 mg Oral Daily Gonzella Lex, MD   5 mg at 09/26/16 [id number]  . LORazepam (ATIVAN) tablet 1 mg  1 mg Oral Q6H PRN Hildred Priest, MD   1 mg at 09/25/16 0940  . LORazepam (ATIVAN) tablet 2 mg  2 mg Oral QHS Hildred Priest, MD      . magnesium hydroxide (MILK OF MAGNESIA) suspension 30 mL  30 mL Oral Daily PRN Gonzella Lex, MD      . mometasone-formoterol [facility]) 200-5 MCG/ACT inhaler 2 puff  2 puff Inhalation BID Gonzella Lex, MD   2 puff at 09/26/16  [phone]  . OLANZapine (ZYPREXA) tablet 10 mg  10 mg Oral QHS Gonzella Lex, MD   10 mg at 09/25/16 2117  . OLANZapine (ZYPREXA) tablet 5 mg  5 mg Oral BID Hildred Priest, MD   5 mg at 09/26/16 [id number]  . ondansetron (ZOFRAN-ODT) disintegrating tablet 8 mg  8 mg Oral Q8H PRN Hildred Priest, MD      . pantoprazole (PROTONIX) EC tablet 40 mg  40 mg Oral Daily Gonzella Lex, MD   40 mg at 09/26/16 [id number]  . ticagrelor (BRILINTA) tablet 90 mg  90 mg Oral BID Gonzella Lex, MD   90 mg at 09/26/16 [id number]  . timolol (TIMOPTIC) 0.5 % ophthalmic solution 1 drop  1 drop Both Eyes Daily Hildred Priest, MD   1 drop at 09/26/16 0829  . tiotropium (SPIRIVA) inhalation capsule 18 mcg  18 mcg Inhalation Daily Hildred Priest, MD   18 mcg at 09/26/16 0800    Lab Results:  Results for orders placed or performed during the hospital encounter of 09/23/16 (from the past 48 hour(s))  Urinalysis, Complete w Microscopic     Status: Abnormal   Collection Time: 09/24/16  7:01 PM  Result Value Ref Range   Color, Urine YELLOW (A) YELLOW   APPearance HAZY (A) CLEAR   Specific Gravity, Urine 1.018 1.005 - 1.030   pH 5.0 5.0 - 8.0   Glucose, UA NEGATIVE NEGATIVE mg/dL   Hgb urine dipstick MODERATE (A) NEGATIVE   Bilirubin Urine NEGATIVE NEGATIVE   Ketones, ur NEGATIVE NEGATIVE mg/dL   Protein, ur NEGATIVE NEGATIVE mg/dL   Nitrite NEGATIVE NEGATIVE   Leukocytes, UA NEGATIVE NEGATIVE   RBC / HPF 6-30 0 - 5 RBC/hpf   WBC, UA 0-5 0 - 5 WBC/hpf   Bacteria, UA NONE SEEN NONE SEEN   Squamous Epithelial / LPF 0-5 (A) NONE SEEN   Mucous PRESENT    Hyaline Casts, UA PRESENT     Blood Alcohol level:  Lab Results  Component Value Date   ETH <5 [id number]    Metabolic Disorder Labs: Lab Results  Component  Value Date   HGBA1C 5.2 09/24/2016   MPG 103 09/24/2016   Lab Results  Component Value Date   PROLACTIN 65.6 (H) 09/24/2016   Lab Results  Component Value Date   CHOL 177  09/24/2016   TRIG 126 09/24/2016   HDL 41 09/24/2016   CHOLHDL 4.3 09/24/2016   VLDL 25 09/24/2016   LDLCALC 111 (H) 09/24/2016    Physical Findings: AIMS:  , ,  ,  , Dental Status Current problems with teeth and/or dentures?: No Does patient usually wear dentures?: No  CIWA:    COWS:     Musculoskeletal: Strength & Muscle Tone: within normal limits Gait & Station: normal Patient leans: N/A  Psychiatric Specialty Exam: Physical Exam  Constitutional: He is oriented to person, place, and time. He appears well-developed and well-nourished.  HENT:  Head: Normocephalic and atraumatic.  Eyes: Conjunctivae and EOM are normal.  Neck: Normal range of motion.  Respiratory: Effort normal.  Musculoskeletal: Normal range of motion.  Neurological: He is alert and oriented to person, place, and time.    Review of Systems  Constitutional: Negative.   HENT: Negative.   Eyes: Negative.   Respiratory: Negative.   Cardiovascular: Negative.   Gastrointestinal: Negative.   Genitourinary: Negative.   Musculoskeletal: Negative.   Skin: Negative.   Neurological: Negative.   Endo/Heme/Allergies: Negative.   Psychiatric/Behavioral: Negative.     Blood pressure 127/79, pulse (!) 53, temperature 98.1 F (36.7 C), temperature source Oral, resp. rate 18, height 5\' 6"  (1.676 m), weight 75.8 kg (167 lb), SpO2 97 %.Body mass index is 26.95 kg/m.  General Appearance: Disheveled  Eye Contact:  Good  Speech:  Pressured  Volume:  Normal  Mood:  Euphoric  Affect:  Labile  Thought Process:  Irrelevant and Descriptions of Associations: Tangential  Orientation:  Full (Time, Place, and Person)  Thought Content:  Hallucinations: None  Suicidal Thoughts:  No  Homicidal Thoughts:  No  Memory:  Immediate;   Poor Recent;   Poor Remote;   Poor  Judgement:  Impaired  Insight:  Lacking  Psychomotor Activity:  Increased  Concentration:  Concentration: Poor and Attention Span: Poor  Recall:  Poor  Fund  of Knowledge:  Good  Language:  Good  Akathisia:  No  Handed:    AIMS (if indicated):     Assets:  Agricultural consultant Housing Social Support  ADL's:  Intact  Cognition:  Impaired,  Moderate  Sleep:  Number of Hours: 5.3     Treatment Plan Summary:  No improvement yet.  Patient is a 70 year old married Caucasian who is presenting with mania.   For bipolar disorder the patient has been started on olanzapine. On December 20 the dose was increased from 15mg /d to 20 mg q/day (currently he is on 5 mg of Zyprexa in the morning, 5 mg at lunch and 10 mg in the evening)  I will add Depakote ER 1500 mg at bedtime. I will not start the patient on lithium as he is creatinine was elevated upon admission a his GFR was decreased.  For insomnia: Patient only slept 5 hours last night I will increase the Ativan from 1 mg to a total of 2 mg at bedtime  For hypertension: continued on lisinopril and atenolol. VS are stable  For cardiovascular disease continue Brilinta  For COPD he will be continued on albuterol, dulera and spiriva.  For dyslipidemia he will be continued on Lipitor  For GERD he will be continued on PPI  For glaucoma continue eyedrops  For restless leg syndrome continue Neurontin 300 mg daily at bedtime.  Vitamin B12 deficiency. His vitamin B12 is 189. Continue vitamin B12 for a total of 7 days  Labs: Ammonia was within the normal limits, cholesterol was within the normal limits, hemoglobin A1c was 5.2, TSH was within the normal limits, HIV was nonreactive, RPR was nonreactive, UA was clear. Urine toxicology was negative.   EKG shows sinus bradycardia with a QTC of 434.  Brain MRI: Mild cerebrovascular disease without any other abnormalities.  Collateral information: I have reviewed records and notes from his primary care was started treating him for depression back in September. I contacted the patient's wife had [phone] her  name is Deniro Qadir. She reports patient started having manic symptoms on Saturday. He was talking nonstop. Wanting to deliver Christmas present to 7 AM to somebody's house on Sunday morning. He was becoming more agitated and confrontational with his wife on Sunday. She denies any prior history of symptoms like this. There is also no family history of mental illness that she is aware of.  Hildred Priest, MD 09/26/2016, 11:41 AM

## 2016-09-26 NOTE — [facility] Group Notes (Signed)
Central Group Notes:  (Nursing/MHT/Case Management/Adjunct)  Date:  09/26/2016  Time:  5:12 PM  Type of Therapy:  Psychoeducational Skills  Participation Level:  Active  Participation Quality:  Monopolizing and Redirectable  Affect:  Excited  Cognitive:  Disorganized  Insight:  Lacking  Engagement in Group:  Monopolizing  Modes of Intervention:  Discussion and Education  Summary of Progress/Problems:  Charise Killian 09/26/2016, 5:12 PM

## 2016-09-27 NOTE — Plan of Care (Signed)
Problem: Coping: Goal: Ability to cope will improve Outcome: Not Progressing Continues to be hyper religious & intrusive with peers at times.

## 2016-09-27 NOTE — Progress Notes (Signed)
Patient ID: Bradley Bowers, male   DOB: 09-Mar-1946, 70 y.o.   MRN: [id number] Mood and affect improved due to depakote addition but not to enduring patterns; still intrusive, still religiously preoccupied with grandiosity. Pressure dressing intact to the left elbow but has to be changed today or open to air with bacitracin (recommended) application.

## 2016-09-27 NOTE — [facility] Group Notes (Signed)
[facility] LCSW Group Therapy Note  Date/Time: 09/27/16 1300  Type of Therapy and Topic:  Group Therapy:  Feelings around Relapse and Recovery  Participation Level:  Active   Mood: frustrated but compliant  Description of Group:    Patients in this group will discuss emotions they experience before and after a relapse. They will process how experiencing these feelings, or avoidance of experiencing them, relates to having a relapse. Facilitator will guide patients to explore emotions they have related to recovery. Patients will be encouraged to process which emotions are more powerful. They will be guided to discuss the emotional reaction significant others in their lives may have to patients' relapse or recovery. Patients will be assisted in exploring ways to respond to the emotions of others without this contributing to a relapse.  Therapeutic Goals: 1. Patient will identify two or more emotions that lead to relapse for them:  2. Patient will identify two emotions that result when they relapse:  3. Patient will identify two emotions related to recovery:  4. Patient will demonstrate ability to communicate their needs through discussion and/or role plays.   Summary of Patient Progress: Pt continues to attempt to bring up and discuss topics unrelated to the group topic and to express frustration when redirected back to the topic at hand.  Pt is able to be redirected.  When given the opportunity to contribute to the group topic, pt had trouble making a relevant contribution.  After group, CSW sat down with pt for 10 minute, as pt had requested on numerous occasions for CSW to give him an opportunity to "share my story."  Pt had been references wanting to talk about God and his religious beliefs and mission, however, pt hardly mentioned this on this occasion.  He talked about various unrelated subjects: his IQ, his high score on his high school american history test, his relationship with [employer],  Elray Buba, and his current legal situation with regards to possession of child pornography.  After talking about these topics, pt abruptly announced that he was finished and left the room.  Therapeutic Modalities:   Cognitive Behavioral Therapy Solution-Focused Therapy Assertiveness Training Relapse Prevention Therapy  Lurline Idol, LCSW

## 2016-09-27 NOTE — Progress Notes (Signed)
Recreation Therapy Notes  Date: 12.22.17 Time: 9:30 am Location: Craft Room  Group Topic: Coping Skills  Goal Area(s) Addresses:  Patient will participate in coping skill activity. Patient will verbalize benefit of using art as a coping skill.  Behavioral Response: Attentive, Interactive, Off topic  Intervention: Coloring  Activity: Patients were given coloring sheets and were instructed to think about their emotions and what their minds were focused on while they were coloring.  Education: LRT educated patients on healthy coping skills.  Education Outcome: In group clarification offered  Clinical Observations/Feedback: Patient colored coloring sheet. Patient told peer he was God. Peer stated, "No you are just a human." LRT redirected patient and he complied. Patient was trying to help peer, but peer did not seem like he wanted patient's help. LRT redirected patient. Patient started talking to [place]. Patient contributed to group discussion by stating what emotions he felt during group. LRT had to redirect patient during group discussion. Patient complied.  Leonette Monarch, LRT/CTRS 09/27/2016 10:27 AM

## 2016-09-27 NOTE — Progress Notes (Signed)
[facility] MD Progress Note  09/27/2016 1:06 PM Bradley Bowers  MRN:  [id number] Subjective:  Bradley Bowers is a 70 year old married Caucasian male who was brought in by his outpatient psychiatrist to our emergency department on December 18.   In August of this year the Bradley Bowers was charged with child pornography after being found with pictures of minors in his cell phone. Bradley Bowers has a court hearing for his charges in January. In September he was started on Prozac by his primary care provider and was referred to follow-up with Dr. [name] (outpatient psychiatrist). At some point in time the Bradley Bowers was switched from fluoxetine to sertraline which was increased from 50 mg to 100 mg.  The Bradley Bowers had a follow-up with Dr. [name] yesterday and he was found to be hyperreligious, disorganized, and was voicing suicidal ideation.  Per nursing notes in the ER "Pt states it's all because of the computer and he would like to go back in time and shoot whoever invented the computer. Pt states he was not suicidal until he got here. Pt states he now thinks he should kill himself and take seven or eight others with him."   12/20 Bradley Bowers reports his doing "excellent". He denies problems with mood, appetite, energy, sleep or concentration. Bradley Bowers says deny he is going to be visited by his wife and the sheriff who is going to get him out of this mess. Bradley Bowers has pressured speech, psychomotor agitation, requires significant redirection as he does not abstain topic, although his answers are tangential. Per nursing is that he has been eating well. He is slept 6 hours last night and has been compliant with medications.  12/21 Bradley Bowers reports he is doing wonderful. Denies problems with his sleep, appetite, energy or concentration. He says that his concentration has never been better. He grades his energy has a 12 out of 10 x 10 being the best. He denies side effects from his medications or having any physical complaints. He denies  suicidality, homicidality or auditory or visual hallucinations. Bradley Bowers and her conversation and spoke about time traveling, teaching small children about the assassination of [place]. Helping the sheriff get reelected. Banning  computers in the world and ending mass shootings.  12/22 Bradley Bowers says he is doing excellent. He continues to have pressure speech, psychomotor agitation, tangential thought processes. He still hyper religious. He tells me today he is Jesus Christ because he is walking in his shoes. He tells me I'm going to heaven. He goes on tangents. He denies depressed mood, suicidality, homicidality or auditory or visual hallucinations. Bradley Bowers denies having any physical complaints or side effects from medications. Bradley Bowers has been attending groups and is disruptive  Per nursing: Mood and affect improved due to depakote addition but not to enduring patterns; still intrusive, still religiously preoccupied with grandiosity. Pressure dressing intact to the left elbow but has to be changed today or open to air with bacitracin (recommended) application.   Principal Problem: Bipolar affective disorder, current episode manic with psychotic symptoms ([place]) Diagnosis:   Bradley Bowers Active Problem List   Diagnosis Date Noted  . Coronary artery disease [I25.10] 09/24/2016  . Glaucoma [H40.9] 09/24/2016  . Bipolar affective disorder, current episode manic with psychotic symptoms ([place]) [F31.2] 09/23/2016  . Chronic obstructive pulmonary disease ([place]) [J44.9] 11/06/2015  . Gastro-esophageal reflux disease without esophagitis [K21.9] 10/24/2014  . Obstructive apnea [G47.33] 10/24/2014  . Restless leg [G25.81] 10/24/2014   Total Time spent with Bradley Bowers: 30 minutes  Past Psychiatric History: Bradley Bowers denies  any prior history of mental illness prior to summer of this year . Bradley Bowers denies history of psychiatric hospitalizations or suicidal attempts   Past Medical History:  Past Medical History:  Diagnosis  Date  . Arterial vascular disease 10/24/2014   Overview:  MI 1994, small-vessel disease requiring coumadin   . Asthma   . Centrilobular emphysema ([place]) 10/24/2014   Overview:  2L O2 prn   . Chronic obstructive pulmonary disease ([place]) 11/06/2015  . GERD (gastroesophageal reflux disease)   . Glaucoma   . Heart attack 2/17, 1994  . Heart disease   . Hyperlipemia   . Hypertension   . Restless leg 10/24/2014  . Sleep apnea     Past Surgical History:  Procedure Laterality Date  . CATARACT EXTRACTION W/ INTRAOCULAR LENS  IMPLANT, BILATERAL     Family History:  Family History  Problem Relation Age of Onset  . Nephrolithiasis Father   . Prostate cancer Neg Hx   . Bladder Cancer Neg Hx   . Kidney cancer Neg Hx    Family Psychiatric  History: Bradley Bowers reports his brother died of liver disease related to alcoholism. He denies any history of mental illness in his family or suicides.   Social History: Bradley Bowers lives with his wife. He has been married for 29 year. They do not have any children. Bradley Bowers Retired from work in the [employer]. He has some college education. He does not have any military experience. As far as his legal history his raises serious charges related to child pornography  History  Alcohol Use No     History  Drug Use No    Social History   Social History  . Marital status: Married    Spouse name: N/A  . Number of children: N/A  . Years of education: N/A   Social History Main Topics  . Smoking status: Former Research scientist (life sciences)  . Smokeless tobacco: Never Used  . Alcohol use No  . Drug use: No  . Sexual activity: Not Asked   Other Topics Concern  . None   Social History Narrative  . None    Current Medications: Current Facility-Administered Medications  Medication Dose Route Frequency Provider Last Rate Last Dose  . acetaminophen (TYLENOL) tablet 650 mg  650 mg Oral Q6H PRN Gonzella Lex, MD      . albuterol (PROVENTIL HFA;VENTOLIN HFA) 108 (90 Base) MCG/ACT  inhaler 2 puff  2 puff Inhalation Q4H PRN Gonzella Lex, MD      . alum & mag hydroxide-simeth (MAALOX/MYLANTA) 200-200-20 MG/5ML suspension 30 mL  30 mL Oral Q4H PRN Gonzella Lex, MD      . aspirin EC tablet 81 mg  81 mg Oral Daily Gonzella Lex, MD   81 mg at 09/27/16 0800  . atenolol (TENORMIN) tablet 50 mg  50 mg Oral Daily Gonzella Lex, MD   50 mg at 09/27/16 [id number]  . atorvastatin (LIPITOR) tablet 80 mg  80 mg Oral q1800 Gonzella Lex, MD   80 mg at 09/26/16 1626  . cyanocobalamin ((VITAMIN B-12)) injection 1,000 mcg  1,000 mcg Subcutaneous Daily Hildred Priest, MD   1,000 mcg at 09/27/16 0905  . divalproex (DEPAKOTE ER) 24 hr tablet 1,500 mg  1,500 mg Oral QHS Hildred Priest, MD   1,500 mg at 09/26/16 2116  . feeding supplement (ENSURE ENLIVE) (ENSURE ENLIVE) liquid 237 mL  237 mL Oral BID BM Hildred Priest, MD   237 mL at 09/27/16 1000  .  fluticasone (FLONASE) 50 MCG/ACT nasal spray 1 spray  1 spray Each Nare Daily PRN Hildred Priest, MD      . latanoprost (XALATAN) 0.005 % ophthalmic solution 1 drop  1 drop Both Eyes QHS Hildred Priest, MD   1 drop at 09/26/16 2117  . lisinopril (PRINIVIL,ZESTRIL) tablet 5 mg  5 mg Oral Daily Gonzella Lex, MD   5 mg at 09/27/16 0904  . LORazepam (ATIVAN) tablet 1 mg  1 mg Oral Q6H PRN Hildred Priest, MD   1 mg at 09/25/16 0940  . LORazepam (ATIVAN) tablet 2 mg  2 mg Oral QHS Hildred Priest, MD   2 mg at 09/26/16 2117  . magnesium hydroxide (MILK OF MAGNESIA) suspension 30 mL  30 mL Oral Daily PRN Gonzella Lex, MD      . mometasone-formoterol [facility]) 200-5 MCG/ACT inhaler 2 puff  2 puff Inhalation BID Gonzella Lex, MD   2 puff at 09/27/16 0907  . OLANZapine (ZYPREXA) tablet 10 mg  10 mg Oral QHS Gonzella Lex, MD   10 mg at 09/26/16 2117  . OLANZapine (ZYPREXA) tablet 5 mg  5 mg Oral BID Hildred Priest, MD   5 mg at 09/27/16 0902  . ondansetron (ZOFRAN-ODT)  disintegrating tablet 8 mg  8 mg Oral Q8H PRN Hildred Priest, MD      . pantoprazole (PROTONIX) EC tablet 40 mg  40 mg Oral Daily Gonzella Lex, MD   40 mg at 09/27/16 0902  . ticagrelor (BRILINTA) tablet 90 mg  90 mg Oral BID Gonzella Lex, MD   90 mg at 09/27/16 [id number]  . timolol (TIMOPTIC) 0.5 % ophthalmic solution 1 drop  1 drop Both Eyes Daily Hildred Priest, MD   1 drop at 09/27/16 0908  . tiotropium (SPIRIVA) inhalation capsule 18 mcg  18 mcg Inhalation Daily Hildred Priest, MD   18 mcg at 09/27/16 [id number]    Lab Results:  No results found for this or any previous visit (from the past 48 hour(s)).  Blood Alcohol level:  Lab Results  Component Value Date   ETH <5 [id number]    Metabolic Disorder Labs: Lab Results  Component Value Date   HGBA1C 5.2 09/24/2016   MPG 103 09/24/2016   Lab Results  Component Value Date   PROLACTIN 65.6 (H) 09/24/2016   Lab Results  Component Value Date   CHOL 177 09/24/2016   TRIG 126 09/24/2016   HDL 41 09/24/2016   CHOLHDL 4.3 09/24/2016   VLDL 25 09/24/2016   LDLCALC 111 (H) 09/24/2016    Physical Findings: AIMS:  , ,  ,  , Dental Status Current problems with teeth and/or dentures?: No Does Bradley Bowers usually wear dentures?: No  CIWA:    COWS:     Musculoskeletal: Strength & Muscle Tone: within normal limits Gait & Station: normal Bradley Bowers leans: N/A  Psychiatric Specialty Exam: Physical Exam  Constitutional: He is oriented to person, place, and time. He appears well-developed and well-nourished.  HENT:  Head: Normocephalic and atraumatic.  Eyes: Conjunctivae and EOM are normal.  Neck: Normal range of motion.  Respiratory: Effort normal.  Musculoskeletal: Normal range of motion.  Neurological: He is alert and oriented to person, place, and time.    Review of Systems  Constitutional: Negative.   HENT: Negative.   Eyes: Negative.   Respiratory: Negative.   Cardiovascular: Negative.    Gastrointestinal: Negative.   Genitourinary: Negative.   Musculoskeletal: Negative.   Skin: Negative.   Neurological:  Negative.   Endo/Heme/Allergies: Negative.   Psychiatric/Behavioral: Negative.     Blood pressure (!) 125/53, pulse (!) 57, temperature 97.7 F (36.5 C), temperature source Oral, resp. rate 18, height 5\' 6"  (1.676 m), weight 75.8 kg (167 lb), SpO2 97 %.Body mass index is 26.95 kg/m.  General Appearance: Disheveled  Eye Contact:  Good  Speech:  Pressured  Volume:  Normal  Mood:  Euphoric  Affect:  Labile  Thought Process:  Irrelevant and Descriptions of Associations: Tangential  Orientation:  Full (Time, Place, and Person)  Thought Content:  Hallucinations: None  Suicidal Thoughts:  No  Homicidal Thoughts:  No  Memory:  Immediate;   Poor Recent;   Poor Remote;   Poor  Judgement:  Impaired  Insight:  Lacking  Psychomotor Activity:  Increased  Concentration:  Concentration: Poor and Attention Span: Poor  Recall:  Poor  Fund of Knowledge:  Good  Language:  Good  Akathisia:  No  Handed:    AIMS (if indicated):     Assets:  Agricultural consultant Housing Social Support  ADL's:  Intact  Cognition:  Impaired,  Moderate  Sleep:  Number of Hours: 6.3     Treatment Plan Summary:  No improvement yet.  Bradley Bowers is a 70 year old married Caucasian who is presenting with mania.   For bipolar disorder the Bradley Bowers has been started on olanzapine. On December 20 the dose was increased from 15mg /d to 20 mg q/day (currently he is on 5 mg of Zyprexa in the morning, 5 mg at lunch and 10 mg in the evening)--no changes today  Depakote ER on 12/22, continue 1500 mg at bedtime.  For insomnia: Bradley Bowers only slept 6.3 h last night. Continue Ativan 2 mg by mouth daily at bedtime  For hypertension: continued on lisinopril and atenolol. VS are stable  For cardiovascular disease continue Brilinta  For COPD he will be continued on albuterol,  dulera and spiriva.  For dyslipidemia he will be continued on Lipitor  For GERD he will be continued on PPI  For glaucoma continue eyedrops  For restless leg syndrome continue Neurontin 300 mg daily at bedtime.  Vitamin B12 deficiency. His vitamin B12 is 189. Continue vitamin B12 for a total of 7 days  Labs: Ammonia was within the normal limits, cholesterol was within the normal limits, hemoglobin A1c was 5.2, TSH was within the normal limits, HIV was nonreactive, RPR was nonreactive, UA was clear. Urine toxicology was negative.   EKG shows sinus bradycardia with a QTC of 434.  Brain MRI: Mild cerebrovascular disease without any other abnormalities.  Collateral information: I have reviewed records and notes from his primary care was started treating him for depression back in September. I contacted the Bradley Bowers's wife had [phone] her name is Quentarius Kadri. She reports Bradley Bowers started having manic symptoms on Saturday. He was talking nonstop. Wanting to deliver Christmas present to 7 AM to somebody's house on Sunday morning. He was becoming more agitated and confrontational with his wife on Sunday. She denies any prior history of symptoms like this. There is also no family history of mental illness that she is aware of.    Spoke with Dr [name] on 12/20 and 12/21.  Spoke with wife again on 12/21.  Hildred Priest, MD 09/27/2016, 1:06 PM

## 2016-09-27 NOTE — Progress Notes (Signed)
Patient is continues to be hyper religious.First thing the patient ask the writer this morning is"Are you a christian?".Stated that he will be going to heaven.Denies suicidal or homicidal ideations and AV hallucinations.Patient was intrusive with peers at times but easily redirectable.Compliant with medications.Attended groups.Appetite & energy level good.Support & encouragement given.

## 2016-09-28 NOTE — [facility] Group Notes (Signed)
[facility] Notes:  (Nursing/MHT/Case Management/Adjunct)  Date:  09/28/2016  Time:  4:40 AM  Type of Therapy:  Psychoeducational Skills  Participation Level:  Active  Participation Quality:  Appropriate, Attentive and Sharing  Affect:  Appropriate  Cognitive:  Appropriate  Insight:  Appropriate and Good  Engagement in Group:  Engaged  Modes of Intervention:  Discussion, Socialization and Support  Summary of Progress/Problems:  Reece Agar 09/28/2016, 4:40 AM

## 2016-09-28 NOTE — Plan of Care (Signed)
Problem: Nutrition: Goal: Adequate nutrition will be maintained Outcome: Progressing Adequate nutrition has been maintained.

## 2016-09-28 NOTE — Progress Notes (Signed)
Patient continues to be hyper religious and he is hyperverbal. Denies suicidal or homicidal ideations and AV hallucinations.Patient was intrusive with peers at times but he is redirectable. He was compliant with medications.Attended groups.Support & encouragement given. Appears to be resting in bed quietly.

## 2016-09-28 NOTE — Progress Notes (Signed)
[facility] MD Progress Note  09/28/2016 10:59 AM Bradley Bowers  MRN:  [id number] Subjective:  Patient is a 70 year old married Caucasian male who was brought in by his outpatient psychiatrist to our emergency department on December 18.   In August of this year the patient was charged with child pornography after being found with pictures of minors in his cell phone. Patient has a court hearing for his charges in January. In September he was started on Prozac by his primary care provider and was referred to follow-up with Dr. [name] (outpatient psychiatrist). At some point in time the patient was switched from fluoxetine to sertraline which was increased from 50 mg to 100 mg.  The patient had a follow-up with Dr. [name] yesterday and he was found to be hyperreligious, disorganized, and was voicing suicidal ideation.  Per nursing notes in the ER "Pt states it's all because of the computer and he would like to go back in time and shoot whoever invented the computer. Pt states he was not suicidal until he got here. Pt states he now thinks he should kill himself and take seven or eight others with him."   12/20 patient reports his doing "excellent". He denies problems with mood, appetite, energy, sleep or concentration. Patient says deny he is going to be visited by his wife and the sheriff who is going to get him out of this mess. Patient has pressured speech, psychomotor agitation, requires significant redirection as he does not abstain topic, although his answers are tangential. Per nursing is that he has been eating well. He is slept 6 hours last night and has been compliant with medications.  12/21 patient reports he is doing wonderful. Denies problems with his sleep, appetite, energy or concentration. He says that his concentration has never been better. He grades his energy has a 12 out of 10 x 10 being the best. He denies side effects from his medications or having any physical complaints. He denies  suicidality, homicidality or auditory or visual hallucinations. Patient and her conversation and spoke about time traveling, teaching small children about the assassination of [place]. Helping the sheriff get reelected. Banning  computers in the world and ending mass shootings.  12/22 patient says he is doing excellent. He continues to have pressure speech, psychomotor agitation, tangential thought processes. He still hyper religious. He tells me today he is Jesus Christ because he is walking in his shoes. He tells me I'm going to heaven. He goes on tangents. He denies depressed mood, suicidality, homicidality or auditory or visual hallucinations. Patient denies having any physical complaints or side effects from medications. Patient has been attending groups and is disruptive  12/23 patient denies having any issues or complaints today. He continues to be very tangentiality and now he is talking about Kenay [place]. It appears to me he was less disorganized than before. He still hyper religious and continues to talk about salvation. He continues to have psychomotor agitation but not as severe as it was for the last 2 days. Per nursing is that he has been compliant with medication and he has been is sleeping well. He denies having problems with mood, appetite, energy is sleep or concentration. Denies suicidality, homicidality or laboratory or visual hallucinations and as physical complaints or having side effects from his medications  Per nursing: Patient continues to be hyper religious and he is hyperverbal. Denies suicidal or homicidal ideations and AV hallucinations.Patient was intrusive with peers at times but he is redirectable.  He was compliant with medications.Attended groups.Support & encouragement given. Appears to be resting in bed quietly.  Principal Problem: Bipolar affective disorder, current episode manic with psychotic symptoms ([place]) Diagnosis:   Patient Active Problem List   Diagnosis Date  Noted  . Coronary artery disease [I25.10] 09/24/2016  . Glaucoma [H40.9] 09/24/2016  . Bipolar affective disorder, current episode manic with psychotic symptoms ([place]) [F31.2] 09/23/2016  . Chronic obstructive pulmonary disease ([place]) [J44.9] 11/06/2015  . Gastro-esophageal reflux disease without esophagitis [K21.9] 10/24/2014  . Obstructive apnea [G47.33] 10/24/2014  . Restless leg [G25.81] 10/24/2014   Total Time spent with patient: 30 minutes  Past Psychiatric History: Patient denies any prior history of mental illness prior to summer of this year . Patient denies history of psychiatric hospitalizations or suicidal attempts   Past Medical History:  Past Medical History:  Diagnosis Date  . Arterial vascular disease 10/24/2014   Overview:  MI 1994, small-vessel disease requiring coumadin   . Asthma   . Centrilobular emphysema ([place]) 10/24/2014   Overview:  2L O2 prn   . Chronic obstructive pulmonary disease ([place]) 11/06/2015  . GERD (gastroesophageal reflux disease)   . Glaucoma   . Heart attack 2/17, 1994  . Heart disease   . Hyperlipemia   . Hypertension   . Restless leg 10/24/2014  . Sleep apnea     Past Surgical History:  Procedure Laterality Date  . CATARACT EXTRACTION W/ INTRAOCULAR LENS  IMPLANT, BILATERAL     Family History:  Family History  Problem Relation Age of Onset  . Nephrolithiasis Father   . Prostate cancer Neg Hx   . Bladder Cancer Neg Hx   . Kidney cancer Neg Hx    Family Psychiatric  History: Patient reports his brother died of liver disease related to alcoholism. He denies any history of mental illness in his family or suicides.   Social History: Patient lives with his wife. He has been married for 29 year. They do not have any children. Patient Retired from work in the [employer]. He has some college education. He does not have any military experience. As far as his legal history his raises serious charges related to child pornography  History  Alcohol  Use No     History  Drug Use No    Social History   Social History  . Marital status: Married    Spouse name: N/A  . Number of children: N/A  . Years of education: N/A   Social History Main Topics  . Smoking status: Former Research scientist (life sciences)  . Smokeless tobacco: Never Used  . Alcohol use No  . Drug use: No  . Sexual activity: Not Asked   Other Topics Concern  . None   Social History Narrative  . None    Current Medications: Current Facility-Administered Medications  Medication Dose Route Frequency Provider Last Rate Last Dose  . acetaminophen (TYLENOL) tablet 650 mg  650 mg Oral Q6H PRN Gonzella Lex, MD      . albuterol (PROVENTIL HFA;VENTOLIN HFA) 108 (90 Base) MCG/ACT inhaler 2 puff  2 puff Inhalation Q4H PRN Gonzella Lex, MD      . alum & mag hydroxide-simeth (MAALOX/MYLANTA) 200-200-20 MG/5ML suspension 30 mL  30 mL Oral Q4H PRN Gonzella Lex, MD      . aspirin EC tablet 81 mg  81 mg Oral Daily Gonzella Lex, MD   81 mg at 09/28/16 0827  . atenolol (TENORMIN) tablet 50 mg  50 mg Oral  Daily Gonzella Lex, MD   50 mg at 09/28/16 [id number]  . atorvastatin (LIPITOR) tablet 80 mg  80 mg Oral q1800 Gonzella Lex, MD   80 mg at 09/27/16 1721  . cyanocobalamin ((VITAMIN B-12)) injection 1,000 mcg  1,000 mcg Subcutaneous Daily Hildred Priest, MD   1,000 mcg at 09/28/16 0826  . divalproex (DEPAKOTE ER) 24 hr tablet 1,500 mg  1,500 mg Oral QHS Hildred Priest, MD   1,500 mg at 09/27/16 2136  . feeding supplement (ENSURE ENLIVE) (ENSURE ENLIVE) liquid 237 mL  237 mL Oral BID BM Hildred Priest, MD   237 mL at 09/27/16 1400  . fluticasone (FLONASE) 50 MCG/ACT nasal spray 1 spray  1 spray Each Nare Daily PRN Hildred Priest, MD      . latanoprost (XALATAN) 0.005 % ophthalmic solution 1 drop  1 drop Both Eyes QHS Hildred Priest, MD   1 drop at 09/27/16 2138  . lisinopril (PRINIVIL,ZESTRIL) tablet 5 mg  5 mg Oral Daily Gonzella Lex, MD   5 mg  at 09/28/16 0826  . LORazepam (ATIVAN) tablet 1 mg  1 mg Oral Q6H PRN Hildred Priest, MD   1 mg at 09/25/16 0940  . LORazepam (ATIVAN) tablet 2 mg  2 mg Oral QHS Hildred Priest, MD   2 mg at 09/27/16 2137  . magnesium hydroxide (MILK OF MAGNESIA) suspension 30 mL  30 mL Oral Daily PRN Gonzella Lex, MD      . mometasone-formoterol [facility]) 200-5 MCG/ACT inhaler 2 puff  2 puff Inhalation BID Gonzella Lex, MD   2 puff at 09/28/16 0825  . OLANZapine (ZYPREXA) tablet 10 mg  10 mg Oral QHS Gonzella Lex, MD   10 mg at 09/27/16 2137  . OLANZapine (ZYPREXA) tablet 5 mg  5 mg Oral BID Hildred Priest, MD   5 mg at 09/28/16 [id number]  . ondansetron (ZOFRAN-ODT) disintegrating tablet 8 mg  8 mg Oral Q8H PRN Hildred Priest, MD      . pantoprazole (PROTONIX) EC tablet 40 mg  40 mg Oral Daily Gonzella Lex, MD   40 mg at 09/28/16 0827  . ticagrelor (BRILINTA) tablet 90 mg  90 mg Oral BID Gonzella Lex, MD   90 mg at 09/28/16 [id number]  . timolol (TIMOPTIC) 0.5 % ophthalmic solution 1 drop  1 drop Both Eyes Daily Hildred Priest, MD   1 drop at 09/28/16 0829  . tiotropium (SPIRIVA) inhalation capsule 18 mcg  18 mcg Inhalation Daily Hildred Priest, MD   18 mcg at 09/28/16 0825    Lab Results:  No results found for this or any previous visit (from the past 32 hour(s)).  Blood Alcohol level:  Lab Results  Component Value Date   ETH <5 [id number]    Metabolic Disorder Labs: Lab Results  Component Value Date   HGBA1C 5.2 09/24/2016   MPG 103 09/24/2016   Lab Results  Component Value Date   PROLACTIN 65.6 (H) 09/24/2016   Lab Results  Component Value Date   CHOL 177 09/24/2016   TRIG 126 09/24/2016   HDL 41 09/24/2016   CHOLHDL 4.3 09/24/2016   VLDL 25 09/24/2016   LDLCALC 111 (H) 09/24/2016    Physical Findings: AIMS:  , ,  ,  , Dental Status Current problems with teeth and/or dentures?: No Does patient usually wear dentures?: No   CIWA:    COWS:     Musculoskeletal: Strength & Muscle Tone: within normal limits Gait &  Station: normal Patient leans: N/A  Psychiatric Specialty Exam: Physical Exam  Constitutional: He is oriented to person, place, and time. He appears well-developed and well-nourished.  HENT:  Head: Normocephalic and atraumatic.  Eyes: Conjunctivae and EOM are normal.  Neck: Normal range of motion.  Respiratory: Effort normal.  Musculoskeletal: Normal range of motion.  Neurological: He is alert and oriented to person, place, and time.    Review of Systems  Constitutional: Negative.   HENT: Negative.   Eyes: Negative.   Respiratory: Negative.   Cardiovascular: Negative.   Gastrointestinal: Negative.   Genitourinary: Negative.   Musculoskeletal: Negative.   Skin: Negative.   Neurological: Negative.   Endo/Heme/Allergies: Negative.   Psychiatric/Behavioral: Negative.     Blood pressure (!) 125/53, pulse (!) 57, temperature 97.7 F (36.5 C), temperature source Oral, resp. rate 18, height 5\' 6"  (1.676 m), weight 75.8 kg (167 lb), SpO2 97 %.Body mass index is 26.95 kg/m.  General Appearance: Disheveled  Eye Contact:  Good  Speech:  Pressured  Volume:  Normal  Mood:  Euphoric  Affect:  Labile  Thought Process:  Irrelevant and Descriptions of Associations: Tangential  Orientation:  Full (Time, Place, and Person)  Thought Content:  Hallucinations: None  Suicidal Thoughts:  No  Homicidal Thoughts:  No  Memory:  Immediate;   Poor Recent;   Poor Remote;   Poor  Judgement:  Impaired  Insight:  Lacking  Psychomotor Activity:  Increased  Concentration:  Concentration: Poor and Attention Span: Poor  Recall:  Poor  Fund of Knowledge:  Good  Language:  Good  Akathisia:  No  Handed:    AIMS (if indicated):     Assets:  Agricultural consultant Housing Social Support  ADL's:  Intact  Cognition:  Impaired,  Moderate  Sleep:  Number of Hours: 6.5      Treatment Plan Summary:  Mild improvement  Patient is a 70 year old married Caucasian who is presenting with mania.   For bipolar disorder the patient has been started on olanzapine. On December 20 the dose was increased from 15mg /d to 20 mg q/day (currently he is on 5 mg of Zyprexa in the morning, 5 mg at lunch and 10 mg in the evening)--no changes today appears to be improving  Depakote ER on 12/22, continue 1500 mg at bedtime. Will need a level on 12/27 bedtime  For insomnia: Patient slept 6.5 h last night. Continue Ativan 2 mg by mouth daily at bedtime  For hypertension: continued on lisinopril and atenolol. VS are stable (mild bradycardia)  For cardiovascular disease continue Brilinta  For COPD he will be continued on albuterol, dulera and spiriva.  For dyslipidemia he will be continued on Lipitor  For GERD he will be continued on PPI  For glaucoma continue eyedrops  For restless leg syndrome continue Neurontin 300 mg daily at bedtime.  Vitamin B12 deficiency. His vitamin B12 is 189. Continue vitamin B12 for a total of 7 days  Labs: Ammonia was within the normal limits, cholesterol was within the normal limits, hemoglobin A1c was 5.2, TSH was within the normal limits, HIV was nonreactive, RPR was nonreactive, UA was clear. Urine toxicology was negative.   EKG shows sinus bradycardia with a QTC of 434.  Brain MRI: Mild cerebrovascular disease without any other abnormalities.  Collateral information: I have reviewed records and notes from his primary care was started treating him for depression back in September. I contacted the patient's wife had [phone] her name is Johnavan Doeden. She  reports patient started having manic symptoms on Saturday. He was talking nonstop. Wanting to deliver Christmas present to 7 AM to somebody's house on Sunday morning. He was becoming more agitated and confrontational with his wife on Sunday. She denies any prior history of  symptoms like this. There is also no family history of mental illness that she is aware of.    Spoke with Dr [name] on 12/20 and 12/21.  Spoke with wife again on 12/21.  Hildred Priest, MD 09/28/2016, 10:59 AM

## 2016-09-28 NOTE — Progress Notes (Signed)
Patient has been pacing around halls most of day. Has been interacting with staff and peers. Still hyper-vebral, hyper-religious, and manic. Is medication complaint. Denies SI/AVH. Responding to internal stimuli, saying God is talking to him in his head. Thoughts still disorganized as well. Pressure dressing changed to left arm from injury earlier this week.Very pleasant and talkative. Remains on Q15 min checks for safety. Will continue to monitor.

## 2016-09-28 NOTE — B[facility] Group Notes (Signed)
[place] LCSW Group Therapy  09/28/2016 3:39 PM  Type of Therapy:  Group Therapy  Participation Level:  Active  Participation Quality:  Attentive  Affect:  Anxious and Labile  Cognitive:  Disorganized, Confused and Delusional  Insight:  Distracting, Limited and Off Topic  Engagement in Therapy:  Lacking  Modes of Intervention:  Activity, Discussion, Education, Problem-solving, Reality Testing and Support  Summary of Progress/Problems: Communications: Patients identify how individuals communicate with one another appropriately and inappropriately. Patients will be guided to discuss their thoughts, feelings, and behaviors related to barriers when communicating. The group will process together ways to execute positive and appropriate communications.  Patient was delusional during group stating "I am jesus, can I save you?". Patient focused mostly on his coloring sheet. CSW asked patient to read a section on the worksheet and patient was able to read out loud appropriately. CSW offered support to patient.   Kanchan Gal G. [place], [place] 09/28/2016, 3:46 PM

## 2016-09-29 NOTE — Progress Notes (Signed)
[facility] MD Progress Note  09/29/2016 10:40 AM Bradley Bowers  MRN:  [id number] Subjective:  Bradley Bowers is a 70 year old married Caucasian male who was brought in by his outpatient psychiatrist to our emergency department on December 18.   In August of this year the Bradley Bowers was charged with child pornography after being found with pictures of minors in his cell phone. Bradley Bowers has a court hearing for his charges in January. In September he was started on Prozac by his primary care provider and was referred to follow-up with Dr. [name] (outpatient psychiatrist). At some point in time the Bradley Bowers was switched from fluoxetine to sertraline which was increased from 50 mg to 100 mg.  The Bradley Bowers had a follow-up with Dr. [name] yesterday and he was found to be hyperreligious, disorganized, and was voicing suicidal ideation.  Per nursing notes in the ER "Pt states it's all because of the computer and he would like to go back in time and shoot whoever invented the computer. Pt states he was not suicidal until he got here. Pt states he now thinks he should kill himself and take seven or eight others with him."   12/20 Bradley Bowers reports his doing "excellent". He denies problems with mood, appetite, energy, sleep or concentration. Bradley Bowers says deny he is going to be visited by his wife and the sheriff who is going to get him out of this mess. Bradley Bowers has pressured speech, psychomotor agitation, requires significant redirection as he does not abstain topic, although his answers are tangential. Per nursing is that he has been eating well. He is slept 6 hours last night and has been compliant with medications.  12/21 Bradley Bowers reports he is doing wonderful. Denies problems with his sleep, appetite, energy or concentration. He says that his concentration has never been better. He grades his energy has a 12 out of 10 x 10 being the best. He denies side effects from his medications or having any physical complaints. He denies  suicidality, homicidality or auditory or visual hallucinations. Bradley Bowers and her conversation and spoke about time traveling, teaching small children about the assassination of [place]. Helping the sheriff get reelected. Banning  computers in the world and ending mass shootings.  12/22 Bradley Bowers says he is doing excellent. He continues to have pressure speech, psychomotor agitation, tangential thought processes. He still hyper religious. He tells me today he is Jesus Christ because he is walking in his shoes. He tells me I'm going to heaven. He goes on tangents. He denies depressed mood, suicidality, homicidality or auditory or visual hallucinations. Bradley Bowers denies having any physical complaints or side effects from medications. Bradley Bowers has been attending groups and is disruptive  12/23 Bradley Bowers denies having any issues or complaints today. He continues to be very tangentiality and now he is talking about Ajak Yoshimura. It appears to me he was less disorganized than before. He still hyper religious and continues to talk about salvation. He continues to have psychomotor agitation but not as severe as it was for the last 2 days. Per nursing is that he has been compliant with medication and he has been is sleeping well. He denies having problems with mood, appetite, energy is sleep or concentration. Denies suicidality, homicidality or laboratory or visual hallucinations and as physical complaints or having side effects from his medications  12/24 Bradley Bowers slept well last night. He has been compliant with medications. She can be intrusive at times but no aggression or agitation. He continues to be hyperreligious. Today he  tells me he is [place]  and also Jesus Christ but later on he says he knows he is not the real [employer]. He continues to talk about salvation and sins. He denies SI, HI or auditory or visual hallucinations. He denies side effects from medications or any physical complaints.  Per  nursing: Bradley Bowers is cooperative on approach.Continues to be hyper religious.Denies suicidal or homicidal ideations & AV hallucinations.Comliant with medications.Attended group.Support & encouragement given.  Principal Problem: Bipolar affective disorder, current episode manic with psychotic symptoms ([place]) Diagnosis:   Bradley Bowers Active Problem List   Diagnosis Date Noted  . Coronary artery disease [I25.10] 09/24/2016  . Glaucoma [H40.9] 09/24/2016  . Bipolar affective disorder, current episode manic with psychotic symptoms ([place]) [F31.2] 09/23/2016  . Chronic obstructive pulmonary disease ([place]) [J44.9] 11/06/2015  . Gastro-esophageal reflux disease without esophagitis [K21.9] 10/24/2014  . Obstructive apnea [G47.33] 10/24/2014  . Restless leg [G25.81] 10/24/2014   Total Time spent with Bradley Bowers: 30 minutes  Past Psychiatric History: Bradley Bowers denies any prior history of mental illness prior to summer of this year . Bradley Bowers denies history of psychiatric hospitalizations or suicidal attempts   Past Medical History:  Past Medical History:  Diagnosis Date  . Arterial vascular disease 10/24/2014   Overview:  MI 1994, small-vessel disease requiring coumadin   . Asthma   . Centrilobular emphysema ([place]) 10/24/2014   Overview:  2L O2 prn   . Chronic obstructive pulmonary disease ([place]) 11/06/2015  . GERD (gastroesophageal reflux disease)   . Glaucoma   . Heart attack 2/17, 1994  . Heart disease   . Hyperlipemia   . Hypertension   . Restless leg 10/24/2014  . Sleep apnea     Past Surgical History:  Procedure Laterality Date  . CATARACT EXTRACTION W/ INTRAOCULAR LENS  IMPLANT, BILATERAL     Family History:  Family History  Problem Relation Age of Onset  . Nephrolithiasis Father   . Prostate cancer Neg Hx   . Bladder Cancer Neg Hx   . Kidney cancer Neg Hx    Family Psychiatric  History: Bradley Bowers reports his brother died of liver disease related to alcoholism. He denies any history of mental  illness in his family or suicides.   Social History: Bradley Bowers lives with his wife. He has been married for 29 year. They do not have any children. Bradley Bowers Retired from work in the [employer]. He has some college education. He does not have any military experience. As far as his legal history his raises serious charges related to child pornography  History  Alcohol Use No     History  Drug Use No    Social History   Social History  . Marital status: Married    Spouse name: N/A  . Number of children: N/A  . Years of education: N/A   Social History Main Topics  . Smoking status: Former Research scientist (life sciences)  . Smokeless tobacco: Never Used  . Alcohol use No  . Drug use: No  . Sexual activity: Not Asked   Other Topics Concern  . None   Social History Narrative  . None    Current Medications: Current Facility-Administered Medications  Medication Dose Route Frequency Provider Last Rate Last Dose  . acetaminophen (TYLENOL) tablet 650 mg  650 mg Oral Q6H PRN Gonzella Lex, MD      . albuterol (PROVENTIL HFA;VENTOLIN HFA) 108 (90 Base) MCG/ACT inhaler 2 puff  2 puff Inhalation Q4H PRN Gonzella Lex, MD      .  alum & mag hydroxide-simeth (MAALOX/MYLANTA) 200-200-20 MG/5ML suspension 30 mL  30 mL Oral Q4H PRN Gonzella Lex, MD      . aspirin EC tablet 81 mg  81 mg Oral Daily Gonzella Lex, MD   81 mg at 09/29/16 0830  . atenolol (TENORMIN) tablet 50 mg  50 mg Oral Daily Gonzella Lex, MD   50 mg at 09/29/16 0829  . atorvastatin (LIPITOR) tablet 80 mg  80 mg Oral q1800 Gonzella Lex, MD   80 mg at 09/28/16 1747  . cyanocobalamin ((VITAMIN B-12)) injection 1,000 mcg  1,000 mcg Subcutaneous Daily Hildred Priest, MD   1,000 mcg at 09/29/16 [id number]  . divalproex (DEPAKOTE ER) 24 hr tablet 1,500 mg  1,500 mg Oral QHS Hildred Priest, MD   1,500 mg at 09/28/16 2124  . feeding supplement (ENSURE ENLIVE) (ENSURE ENLIVE) liquid 237 mL  237 mL Oral BID BM Hildred Priest, MD    237 mL at 09/29/16 1000  . fluticasone (FLONASE) 50 MCG/ACT nasal spray 1 spray  1 spray Each Nare Daily PRN Hildred Priest, MD      . latanoprost (XALATAN) 0.005 % ophthalmic solution 1 drop  1 drop Both Eyes QHS Hildred Priest, MD   1 drop at 09/28/16 2125  . lisinopril (PRINIVIL,ZESTRIL) tablet 5 mg  5 mg Oral Daily Gonzella Lex, MD   5 mg at 09/29/16 0829  . LORazepam (ATIVAN) tablet 1 mg  1 mg Oral Q6H PRN Hildred Priest, MD   1 mg at 09/25/16 0940  . LORazepam (ATIVAN) tablet 2 mg  2 mg Oral QHS Hildred Priest, MD   2 mg at 09/28/16 2124  . magnesium hydroxide (MILK OF MAGNESIA) suspension 30 mL  30 mL Oral Daily PRN Gonzella Lex, MD      . mometasone-formoterol [facility]) 200-5 MCG/ACT inhaler 2 puff  2 puff Inhalation BID Gonzella Lex, MD   2 puff at 09/29/16 [phone]  . OLANZapine (ZYPREXA) tablet 10 mg  10 mg Oral QHS Gonzella Lex, MD   10 mg at 09/28/16 2124  . OLANZapine (ZYPREXA) tablet 5 mg  5 mg Oral BID Hildred Priest, MD   5 mg at 09/29/16 0829  . ondansetron (ZOFRAN-ODT) disintegrating tablet 8 mg  8 mg Oral Q8H PRN Hildred Priest, MD      . pantoprazole (PROTONIX) EC tablet 40 mg  40 mg Oral Daily Gonzella Lex, MD   40 mg at 09/29/16 0829  . ticagrelor (BRILINTA) tablet 90 mg  90 mg Oral BID Gonzella Lex, MD   90 mg at 09/29/16 0830  . timolol (TIMOPTIC) 0.5 % ophthalmic solution 1 drop  1 drop Both Eyes Daily Hildred Priest, MD   1 drop at 09/29/16 0830  . tiotropium (SPIRIVA) inhalation capsule 18 mcg  18 mcg Inhalation Daily Hildred Priest, MD   18 mcg at 09/29/16 0827    Lab Results:  No results found for this or any previous visit (from the past 63 hour(s)).  Blood Alcohol level:  Lab Results  Component Value Date   ETH <5 [id number]    Metabolic Disorder Labs: Lab Results  Component Value Date   HGBA1C 5.2 09/24/2016   MPG 103 09/24/2016   Lab Results  Component  Value Date   PROLACTIN 65.6 (H) 09/24/2016   Lab Results  Component Value Date   CHOL 177 09/24/2016   TRIG 126 09/24/2016   HDL 41 09/24/2016   CHOLHDL 4.3 09/24/2016  VLDL 25 09/24/2016   LDLCALC 111 (H) 09/24/2016    Physical Findings: AIMS:  , ,  ,  , Dental Status Current problems with teeth and/or dentures?: No Does Bradley Bowers usually wear dentures?: No  CIWA:    COWS:     Musculoskeletal: Strength & Muscle Tone: within normal limits Gait & Station: normal Bradley Bowers leans: N/A  Psychiatric Specialty Exam: Physical Exam  Constitutional: He is oriented to person, place, and time. He appears well-developed and well-nourished.  HENT:  Head: Normocephalic and atraumatic.  Eyes: Conjunctivae and EOM are normal.  Neck: Normal range of motion.  Respiratory: Effort normal.  Musculoskeletal: Normal range of motion.  Neurological: He is alert and oriented to person, place, and time.    Review of Systems  Constitutional: Negative.   HENT: Negative.   Eyes: Negative.   Respiratory: Negative.   Cardiovascular: Negative.   Gastrointestinal: Negative.   Genitourinary: Negative.   Musculoskeletal: Negative.   Skin: Negative.   Neurological: Negative.   Endo/Heme/Allergies: Negative.   Psychiatric/Behavioral: Negative.     Blood pressure 125/76, pulse 69, temperature 97.7 F (36.5 C), temperature source Oral, resp. rate 20, height 5\' 6"  (1.676 m), weight 75.8 kg (167 lb), SpO2 97 %.Body mass index is 26.95 kg/m.  General Appearance: Disheveled  Eye Contact:  Good  Speech:  Pressured  Volume:  Normal  Mood:  Euphoric  Affect:  Labile  Thought Process:  Irrelevant and Descriptions of Associations: Tangential  Orientation:  Full (Time, Place, and Person)  Thought Content:  Hallucinations: None  Suicidal Thoughts:  No  Homicidal Thoughts:  No  Memory:  Immediate;   Poor Recent;   Poor Remote;   Poor  Judgement:  Impaired  Insight:  Lacking  Psychomotor Activity:   Increased  Concentration:  Concentration: Poor and Attention Span: Poor  Recall:  Poor  Fund of Knowledge:  Good  Language:  Good  Akathisia:  No  Handed:    AIMS (if indicated):     Assets:  Agricultural consultant Housing Social Support  ADL's:  Intact  Cognition:  Impaired,  Moderate  Sleep:  Number of Hours: 6.5     Treatment Plan Summary:  Mild improvement, Has been is sleeping well for the last 3 nights and appears to have less psychomotor agitation. No changes in medications today  Bradley Bowers is a 70 year old married Caucasian who is presenting with mania.   For bipolar disorder the Bradley Bowers has been started on olanzapine. On December 20 the dose was increased from 15mg /d to 20 mg q/day (currently he is on 5 mg of Zyprexa in the morning, 5 mg at lunch and 10 mg in the evening)--no changes today appears to be improving  Depakote ER on 12/22, continue 1500 mg at bedtime. Will need a level on 12/27 bedtime  For insomnia: Bradley Bowers slept 6.5 h last night. Continue Ativan 2 mg by mouth daily at bedtime  For hypertension: continued on lisinopril and atenolol. VS are stable   For cardiovascular disease continue Brilinta  For COPD: continued on albuterol, dulera and spiriva.  For dyslipidemia:  continued on Lipitor  For GERD: continued on Protonix  For glaucoma continue eyedrops  For restless leg syndrome continue Neurontin 300 mg daily at bedtime--- will d/c for now as pt on depakote  Vitamin B12 deficiency. His vitamin B12 is 189. Continue vitamin B12 for a total of 7 days, then weekly  Labs: Ammonia was within the normal limits, cholesterol was within the normal limits, hemoglobin  A1c was 5.2, TSH was within the normal limits, HIV was nonreactive, RPR was nonreactive, UA was clear. Urine toxicology was negative.   EKG shows sinus bradycardia with a QTC of 434.  Brain MRI: Mild cerebrovascular disease without any other  abnormalities.  Collateral information: I have reviewed records and notes from his primary care was started treating him for depression back in September. I contacted the Bradley Bowers's wife had [phone] her name is Jaspal Mullan. She reports Bradley Bowers started having manic symptoms on Saturday. He was talking nonstop. Wanting to deliver Christmas present to 7 AM to somebody's house on Sunday morning. He was becoming more agitated and confrontational with his wife on Sunday. She denies any prior history of symptoms like this. There is also no family history of mental illness that she is aware of.    Spoke with Dr [name] on 12/20 and 12/21.  Spoke with wife again on 12/21.  Hildred Priest, MD 09/29/2016, 10:40 AM

## 2016-09-29 NOTE — BHH Group Notes (Signed)
[place] LCSW Group Therapy  09/29/2016 12:07 PM  Type of Therapy:  Group Therapy  Participation Level:  Active  Participation Quality:  Redirectable and Sharing  Affect:  Anxious and Excited  Cognitive:  Delusional  Insight:  Limited and Off Topic  Engagement in Therapy:  Lacking  Modes of Intervention:  Activity, Discussion, Education, Problem-solving, Reality Testing, Socialization and Support  Summary of Progress/Problems: Stress management: Patients defined and discussed the topic of stress and the related symptoms and triggers for stress. Patients identified healthy coping skills they would like to try during hospitalization and after discharge to manage stress in a healthy way. CSW offered insight to varying stress management techniques. Patient still thinks he is jesus but was pleasant during group and was redirectable when he was disruptive.   Alassane Kalafut G. [place], [place] 09/29/2016, 12:10 PM

## 2016-09-29 NOTE — Progress Notes (Signed)
Patient has still been manic, hyper- religious, and hyper-verbal. Did take off pressure dressing today and was reapplied by nurse. Stated to nurse this morning that "there is nothing wrong with thinking you are God." Also still says that God is talking to him in his head. Still medication compliant. Speech is still disorganized and tangential. Is very pleasant. Had a foul odor on his body this morning and did take a shower, was happy to do this. Denies SI/HI/AVH. States he is ready to go home. Also speaks a lot about his wife. Remains on Q15 minute checks for safety. Will continue to monitor.

## 2016-09-29 NOTE — Progress Notes (Signed)
Patient is cooperative on approach.Continues to be hyper religious.Denies suicidal or homicidal ideations & AV hallucinations.Comliant with medications.Attended group.Support & encouragement given.

## 2016-09-29 NOTE — Plan of Care (Signed)
Problem: Nutritional: Goal: Ability to achieve adequate nutritional intake will improve Outcome: Progressing Patient is able to achieve adequate nutrition

## 2016-09-29 NOTE — BHH Group Notes (Signed)
[place] Group Notes:  (Nursing/MHT/Case Management/Adjunct)  Date:  09/29/2016  Time:  5:44 AM  Type of Therapy:  Psychoeducational Skills  Participation Level:  Active  Participation Quality:  Appropriate, Sharing and Supportive  Affect:  Appropriate  Cognitive:  Appropriate  Insight:  Appropriate  Engagement in Group:  Engaged  Modes of Intervention:  Discussion, Socialization and Support  Summary of Progress/Problems:  Reece Agar 09/29/2016, 5:44 AM

## 2016-09-30 NOTE — Progress Notes (Signed)
[facility] MD Progress Note  09/30/2016 10:40 AM Bradley Bowers  MRN:  [id number] Subjective:  Bradley Bowers is a 70 year old married Caucasian male who was brought in by his outpatient psychiatrist to our emergency department on December 18.   In August of this year the Bradley Bowers was charged with child pornography after being found with pictures of minors in his cell phone. Bradley Bowers has a court hearing for his charges in January. In September Bradley Bowers was started on Prozac by his primary care provider and was referred to follow-up with Dr. [name] (outpatient psychiatrist). At some point in time the Bradley Bowers was switched from fluoxetine to sertraline which was increased from 50 mg to 100 mg.  The Bradley Bowers had a follow-up with Dr. [name] yesterday and Bradley Bowers was found to be hyperreligious, disorganized, and was voicing suicidal ideation.  Per nursing notes in the ER "Pt states it's all because of the computer and Bradley Bowers would like to go back in time and shoot whoever invented the computer. Pt states Bradley Bowers was not suicidal until Bradley Bowers got here. Pt states Bradley Bowers now thinks Bradley Bowers should kill himself and take seven or eight others with him."   12/20 Bradley Bowers reports his doing "excellent". Bradley Bowers denies problems with mood, appetite, energy, sleep or concentration. Bradley Bowers says deny Bradley Bowers is going to be visited by his wife and the sheriff who is going to get him out of this mess. Bradley Bowers has pressured speech, psychomotor agitation, requires significant redirection as Bradley Bowers does not abstain topic, although his answers are tangential. Per nursing is that Bradley Bowers has been eating well. Bradley Bowers is slept 6 hours last night and has been compliant with medications.  12/21 Bradley Bowers reports Bradley Bowers is doing wonderful. Denies problems with his sleep, appetite, energy or concentration. Bradley Bowers says that his concentration has never been better. Bradley Bowers grades his energy has a 12 out of 10 x 10 being the best. Bradley Bowers denies side effects from his medications or having any physical complaints. Bradley Bowers denies  suicidality, homicidality or auditory or visual hallucinations. Bradley Bowers and her conversation and spoke about time traveling, teaching small children about the assassination of [place]. Helping the sheriff get reelected. Banning  computers in the world and ending mass shootings.  12/22 Bradley Bowers says Bradley Bowers is doing excellent. Bradley Bowers continues to have pressure speech, psychomotor agitation, tangential thought processes. Bradley Bowers still hyper religious. Bradley Bowers tells me today Bradley Bowers is Jesus Christ because Bradley Bowers is walking in his shoes. Bradley Bowers tells me I'm going to heaven. Bradley Bowers goes on tangents. Bradley Bowers denies depressed mood, suicidality, homicidality or auditory or visual hallucinations. Bradley Bowers denies having any physical complaints or side effects from medications. Bradley Bowers has been attending groups and is disruptive  12/23 Bradley Bowers denies having any issues or complaints today. Bradley Bowers continues to be very tangentiality and now Bradley Bowers is talking about [place]. It appears to me Bradley Bowers was less disorganized than before. Bradley Bowers still hyper religious and continues to talk about salvation. Bradley Bowers continues to have psychomotor agitation but not as severe as it was for the last 2 days. Per nursing is that Bradley Bowers has been compliant with medication and Bradley Bowers has been is sleeping well. Bradley Bowers denies having problems with mood, appetite, energy is sleep or concentration. Denies suicidality, homicidality or laboratory or visual hallucinations and as physical complaints or having side effects from his medications  12/24 Bradley Bowers slept well last night. Bradley Bowers has been compliant with medications. She can be intrusive at times but no aggression or agitation. Bradley Bowers continues to be hyperreligious. Today Bradley Bowers  tells me Bradley Bowers is [name]  and also Jesus Christ but later on Bradley Bowers says Bradley Bowers knows Bradley Bowers is not the real [employer]. Bradley Bowers continues to talk about salvation and sins. Bradley Bowers denies SI, HI or auditory or visual hallucinations. Bradley Bowers denies side effects from medications or any physical complaints.  12/25 appears that  euphoria, psychomotor agitation have been gradually decreasing. His speech is not as pressured. Bradley Bowers does not appear to be as intrusive. Bradley Bowers still clearly manic but not as severe as Bradley Bowers was prior to admission. This morning Bradley Bowers was cheerful and bright. Bradley Bowers has been sleeping well. Bradley Bowers has been compliant with medications. Bradley Bowers has been eating well. Bradley Bowers denies any side effects to medications. Bradley Bowers denies any physical complaints. Bradley Bowers denies problems with energy, sleep appetite or concentration.  Per nursing: Pt denies SI/HI/AVH. Attended evening group. Hyper verbal, Tangential. Restless. Medication compliant. Spent time in milieu socializing in day room after evening wrap up group. Voices no additional concerns at this time. Safety maintained. Will continue to monitor.  Principal Problem: Bipolar affective disorder, current episode manic with psychotic symptoms ([place]) Diagnosis:   Bradley Bowers Active Problem List   Diagnosis Date Noted  . Coronary artery disease [I25.10] 09/24/2016  . Glaucoma [H40.9] 09/24/2016  . Bipolar affective disorder, current episode manic with psychotic symptoms ([place]) [F31.2] 09/23/2016  . Chronic obstructive pulmonary disease ([place]) [J44.9] 11/06/2015  . Gastro-esophageal reflux disease without esophagitis [K21.9] 10/24/2014  . Obstructive apnea [G47.33] 10/24/2014  . Restless leg [G25.81] 10/24/2014   Total Time spent with Bradley Bowers: 30 minutes  Past Psychiatric History: Bradley Bowers denies any prior history of mental illness prior to summer of this year . Bradley Bowers denies history of psychiatric hospitalizations or suicidal attempts   Past Medical History:  Past Medical History:  Diagnosis Date  . Arterial vascular disease 10/24/2014   Overview:  MI 1994, small-vessel disease requiring coumadin   . Asthma   . Centrilobular emphysema ([place]) 10/24/2014   Overview:  2L O2 prn   . Chronic obstructive pulmonary disease ([place]) 11/06/2015  . GERD (gastroesophageal reflux disease)   . Glaucoma   .  Heart attack 2/17, 1994  . Heart disease   . Hyperlipemia   . Hypertension   . Restless leg 10/24/2014  . Sleep apnea     Past Surgical History:  Procedure Laterality Date  . CATARACT EXTRACTION W/ INTRAOCULAR LENS  IMPLANT, BILATERAL     Family History:  Family History  Problem Relation Age of Onset  . Nephrolithiasis Father   . Prostate cancer Neg Hx   . Bladder Cancer Neg Hx   . Kidney cancer Neg Hx    Family Psychiatric  History: Bradley Bowers reports his brother died of liver disease related to alcoholism. Bradley Bowers denies any history of mental illness in his family or suicides.   Social History: Bradley Bowers lives with his wife. Bradley Bowers has been married for 29 year. They do not have any children. Bradley Bowers Retired from work in the [employer]. Bradley Bowers has some college education. Bradley Bowers does not have any military experience. As far as his legal history his raises serious charges related to child pornography  History  Alcohol Use No     History  Drug Use No    Social History   Social History  . Marital status: Married    Spouse name: N/A  . Number of children: N/A  . Years of education: N/A   Social History Main Topics  . Smoking status: Former Research scientist (life sciences)  . Smokeless tobacco:  Never Used  . Alcohol use No  . Drug use: No  . Sexual activity: Not Asked   Other Topics Concern  . None   Social History Narrative  . None    Current Medications: Current Facility-Administered Medications  Medication Dose Route Frequency Provider Last Rate Last Dose  . acetaminophen (TYLENOL) tablet 650 mg  650 mg Oral Q6H PRN Gonzella Lex, MD      . albuterol (PROVENTIL HFA;VENTOLIN HFA) 108 (90 Base) MCG/ACT inhaler 2 puff  2 puff Inhalation Q4H PRN Gonzella Lex, MD      . alum & mag hydroxide-simeth (MAALOX/MYLANTA) 200-200-20 MG/5ML suspension 30 mL  30 mL Oral Q4H PRN Gonzella Lex, MD      . aspirin EC tablet 81 mg  81 mg Oral Daily Gonzella Lex, MD   81 mg at 09/30/16 0834  . atenolol (TENORMIN) tablet  50 mg  50 mg Oral Daily Gonzella Lex, MD   50 mg at 09/29/16 0829  . atorvastatin (LIPITOR) tablet 80 mg  80 mg Oral q1800 Gonzella Lex, MD   80 mg at 09/29/16 1703  . cyanocobalamin ((VITAMIN B-12)) injection 1,000 mcg  1,000 mcg Subcutaneous Daily Hildred Priest, MD   1,000 mcg at 09/30/16 0835  . divalproex (DEPAKOTE ER) 24 hr tablet 1,500 mg  1,500 mg Oral QHS Hildred Priest, MD   1,500 mg at 09/29/16 2213  . feeding supplement (ENSURE ENLIVE) (ENSURE ENLIVE) liquid 237 mL  237 mL Oral BID BM Hildred Priest, MD   237 mL at 09/30/16 1027  . fluticasone (FLONASE) 50 MCG/ACT nasal spray 1 spray  1 spray Each Nare Daily PRN Hildred Priest, MD      . latanoprost (XALATAN) 0.005 % ophthalmic solution 1 drop  1 drop Both Eyes QHS Hildred Priest, MD   1 drop at 09/29/16 2213  . lisinopril (PRINIVIL,ZESTRIL) tablet 5 mg  5 mg Oral Daily Gonzella Lex, MD   5 mg at 09/29/16 0829  . LORazepam (ATIVAN) tablet 1 mg  1 mg Oral Q6H PRN Hildred Priest, MD   1 mg at 09/25/16 0940  . LORazepam (ATIVAN) tablet 2 mg  2 mg Oral QHS Hildred Priest, MD   2 mg at 09/29/16 2213  . magnesium hydroxide (MILK OF MAGNESIA) suspension 30 mL  30 mL Oral Daily PRN Gonzella Lex, MD      . mometasone-formoterol [facility]) 200-5 MCG/ACT inhaler 2 puff  2 puff Inhalation BID Gonzella Lex, MD   2 puff at 09/30/16 [phone]  . OLANZapine (ZYPREXA) tablet 10 mg  10 mg Oral QHS Gonzella Lex, MD   10 mg at 09/29/16 2213  . OLANZapine (ZYPREXA) tablet 5 mg  5 mg Oral BID Hildred Priest, MD   5 mg at 09/30/16 0835  . ondansetron (ZOFRAN-ODT) disintegrating tablet 8 mg  8 mg Oral Q8H PRN Hildred Priest, MD      . pantoprazole (PROTONIX) EC tablet 40 mg  40 mg Oral Daily Gonzella Lex, MD   40 mg at 09/30/16 0834  . ticagrelor (BRILINTA) tablet 90 mg  90 mg Oral BID Gonzella Lex, MD   90 mg at 09/30/16 0834  . timolol (TIMOPTIC) 0.5 %  ophthalmic solution 1 drop  1 drop Both Eyes Daily Hildred Priest, MD   1 drop at 09/30/16 0841  . tiotropium (SPIRIVA) inhalation capsule 18 mcg  18 mcg Inhalation Daily Hildred Priest, MD   18 mcg at 09/30/16  0842    Lab Results:  No results found for this or any previous visit (from the past 48 hour(s)).  Blood Alcohol level:  Lab Results  Component Value Date   ETH <5 [id number]    Metabolic Disorder Labs: Lab Results  Component Value Date   HGBA1C 5.2 09/24/2016   MPG 103 09/24/2016   Lab Results  Component Value Date   PROLACTIN 65.6 (H) 09/24/2016   Lab Results  Component Value Date   CHOL 177 09/24/2016   TRIG 126 09/24/2016   HDL 41 09/24/2016   CHOLHDL 4.3 09/24/2016   VLDL 25 09/24/2016   LDLCALC 111 (H) 09/24/2016    Physical Findings: AIMS:  , ,  ,  , Dental Status Current problems with teeth and/or dentures?: No Does Bradley Bowers usually wear dentures?: No  CIWA:    COWS:     Musculoskeletal: Strength & Muscle Tone: within normal limits Gait & Station: normal Bradley Bowers leans: N/A  Psychiatric Specialty Exam: Physical Exam  Constitutional: Bradley Bowers is oriented to person, place, and time. Bradley Bowers appears well-developed and well-nourished.  HENT:  Head: Normocephalic and atraumatic.  Eyes: Conjunctivae and EOM are normal.  Neck: Normal range of motion.  Respiratory: Effort normal.  Musculoskeletal: Normal range of motion.  Neurological: Bradley Bowers is alert and oriented to person, place, and time.    Review of Systems  Constitutional: Negative.   HENT: Negative.   Eyes: Negative.   Respiratory: Negative.   Cardiovascular: Negative.   Gastrointestinal: Negative.   Genitourinary: Negative.   Musculoskeletal: Negative.   Skin: Negative.   Neurological: Negative.   Endo/Heme/Allergies: Negative.   Psychiatric/Behavioral: Negative.     Blood pressure (!) 104/49, pulse 63, temperature 98.2 F (36.8 C), resp. rate 18, height 5\' 6"  (1.676 m),  weight 75.8 kg (167 lb), SpO2 97 %.Body mass index is 26.95 kg/m.  General Appearance: Disheveled  Eye Contact:  Good  Speech:  Pressured  Volume:  Normal  Mood:  Euphoric  Affect:  Labile  Thought Process:  Irrelevant and Descriptions of Associations: Tangential  Orientation:  Full (Time, Place, and Person)  Thought Content:  Hallucinations: None  Suicidal Thoughts:  No  Homicidal Thoughts:  No  Memory:  Immediate;   Poor Recent;   Poor Remote;   Poor  Judgement:  Impaired  Insight:  Lacking  Psychomotor Activity:  Increased  Concentration:  Concentration: Poor and Attention Span: Poor  Recall:  Poor  Fund of Knowledge:  Good  Language:  Good  Akathisia:  No  Handed:    AIMS (if indicated):     Assets:  Agricultural consultant Housing Social Support  ADL's:  Intact  Cognition:  Impaired,  Moderate  Sleep:  Number of Hours: 6     Treatment Plan Summary:  Mild improvement, Has been is sleeping well for the last 4  nights and appears to have less psychomotor agitation. No changes in medications today. We will need to check a Depakote level on Wednesday night before his evening dose  Bradley Bowers is a 70 year old married Caucasian who is presenting with mania.   For bipolar disorder the Bradley Bowers has been started on olanzapine. On December 20 the dose was increased from 15mg /d to 20 mg q/day (currently Bradley Bowers is on 5 mg of Zyprexa in the morning, 5 mg at lunch and 10 mg in the evening)--no changes today appears to be improving  Depakote ER on 12/22, continue 1500 mg at bedtime. Will need a level on 12/27 bedtime  For insomnia: Bradley Bowers slept 6.5 h last night. Continue Ativan 2 mg by mouth daily at bedtime  For hypertension: continued on lisinopril and atenolol. VS have been stable.  Today his BP was a little low.  Meds for BP were held.  Will recheck BP at 12pm and offer meds then if BP betetr  For cardiovascular disease continue Brilinta  For COPD:  continued on albuterol, dulera and spiriva.  For dyslipidemia:  continued on Lipitor  For GERD: continued on Protonix  For glaucoma continue eyedrops  For restless leg syndrome continue Neurontin 300 mg daily at bedtime--- will d/c for now as pt on depakote  Vitamin B12 deficiency. His vitamin B12 is 189. Continue vitamin B12 for a total of 7 days, then weekly  Labs: Ammonia was within the normal limits, cholesterol was within the normal limits, hemoglobin A1c was 5.2, TSH was within the normal limits, HIV was nonreactive, RPR was nonreactive, UA was clear. Urine toxicology was negative.   EKG shows sinus bradycardia with a QTC of 434.  Brain MRI: Mild cerebrovascular disease without any other abnormalities.  Collateral information: I have reviewed records and notes from his primary care was started treating him for depression back in September. I contacted the Bradley Bowers's wife had [phone] her name is Jaithan Morlan. She reports Bradley Bowers started having manic symptoms on Saturday. Bradley Bowers was talking nonstop. Wanting to deliver Christmas present to 7 AM to somebody's house on Sunday morning. Bradley Bowers was becoming more agitated and confrontational with his wife on Sunday. She denies any prior history of symptoms like this. There is also no family history of mental illness that she is aware of.    Spoke with Dr [name] on 12/20 and 12/21.  Spoke with wife again on 12/21.  Hildred Priest, MD 09/30/2016, 10:40 AM

## 2016-09-30 NOTE — BHH Group Notes (Signed)
[place] Group Notes:  (Nursing/MHT/Case Management/Adjunct)  Date:  09/30/2016  Time:  4:49 AM  Type of Therapy:  Psychoeducational Skills  Participation Level:  Active  Participation Quality:  Appropriate  Affect:  Appropriate  Cognitive:  Appropriate  Insight:  Appropriate  Engagement in Group:  Engaged  Modes of Intervention:  Discussion, Socialization and Support  Summary of Progress/Problems:  Reece Agar 09/30/2016, 4:49 AM

## 2016-09-30 NOTE — Plan of Care (Signed)
Problem: Coping: Goal: Ability to identify and develop effective coping behavior will improve Outcome: Progressing Working on Scientific laboratory technician , hand out given.

## 2016-09-30 NOTE — Plan of Care (Signed)
Problem: Safety: Goal: Ability to remain free from injury will improve Outcome: Progressing Pt has remained free from injury this shift.   

## 2016-09-30 NOTE — Progress Notes (Signed)
Pt denies SI/HI/AVH. Attended evening group. Hyper verbal, Tangential. Restless. Medication compliant. Spent time in milieu socializing in day room after evening wrap up group. Voices no additional concerns at this time. Safety maintained. Will continue to monitor.

## 2016-09-30 NOTE — Progress Notes (Signed)
D: Patient remains  Manic . Has not taken a bath. Interacted with his peers working on [employer].  Compliant with medication . Voice of wonderful is was for his wife to visit tast night.. Able to talk with MD this shift.   Patient stated slept good last night .Stated appetite is good and energy level  Is normal. Stated concentration is not good stated he still feel lots of ideas and concerns  On him . Stated not  DepressionDenies suicidal  homicidal ideations  .  No auditory hallucinations  No pain concerns . Appropriate ADL'S. Interacting with peers and staff.  A: Encourage patient participation with unit programming . Instruction  Given on  Medication , verbalize understanding. R: Voice no other concerns. Staff continue to monitor

## 2016-09-30 NOTE — B[facility] Group Notes (Signed)
[place] Group Notes:  (Nursing/MHT/Case Management/Adjunct)  Date:  09/30/2016  Time:  9:37 PM  Type of Therapy:  Evening Wrap-up Group  Participation Level:  Active  Participation Quality: Attentive but Off Topic  Affect:  Not Congruent  Cognitive:  Alert and Disorganized  Insight:  None  Engagement in Group:  Off Topic  Modes of Intervention:  Discussion  Summary of Progress/Problems:  Levonne Spiller 09/30/2016, 9:37 PM

## 2016-10-01 LAB — VALPROIC ACID LEVEL: Valproic Acid Lvl: 89 ug/mL (ref 50.0–100.0)

## 2016-10-01 NOTE — Progress Notes (Signed)
Recreation Therapy Notes  Date: 12.26.17 Time: 9:30 am Location: Craft Room  Group Topic: Self-expression  Goal Area(s) Addresses:  Patient will be able to identify a color that represents each emotion.  Patient will verbalize benefit of using art as a means of self-expression. Patient will verbalize one emotion experienced during group.  Behavioral Response: Attentive, Interactive, Off topic  Intervention: The Colors Within Me  Activity: Patients were given blank face worksheets and were instructed to pick a color for each emotion they were feeling. Patients were instructed to show on the worksheet how much of that emotion they were feeling.  Education: LRT educated patients on other forms of self-expression.   Education Outcome: In group clarification offered  Clinical Observations/Feedback: Patient picked a color for each emotion he was feeling and showing on the worksheet how much of that emotion he was feeling. Patient contributed to group discussion by stating what emotions he was feeling. Patient tried to tell a story that was off topic. LRT redirected patient. LRT had to redirect patient from having side conversations with peer.  Leonette Monarch, LRT/CTRS 10/01/2016 1:24 PM

## 2016-10-01 NOTE — B[facility] Group Notes (Signed)
[place] Group Notes:  (Nursing/MHT/Case Management/Adjunct)  Date:  10/01/2016  Time:  4:09 PM  Type of Therapy:  Psychoeducational Skills  Participation Level:  Active  Participation Quality:  Appropriate, Attentive and Sharing  Affect:  Appropriate  Cognitive:  Alert and Appropriate  Insight:  Appropriate  Engagement in Group:  Engaged  Modes of Intervention:  Discussion, Education and Support  Summary of Progress/Problems:  Adela Lank [facility] 10/01/2016, 4:09 PM

## 2016-10-01 NOTE — Progress Notes (Signed)
[facility] An MD Progress Note  10/01/2016 3:07 PM CARDERO QUIZHPI  MRN:  [id number] Subjective:  Patient is a 70 year old married Caucasian male who was brought in by his outpatient psychiatrist to our emergency department on December 18.   In August of this year the patient was charged with child pornography after being found with pictures of minors in his cell phone. Patient has a court hearing for his charges in January. In September he was started on Prozac by his primary care provider and was referred to follow-up with Dr. [name] (outpatient psychiatrist). At some point in time the patient was switched from fluoxetine to sertraline which was increased from 50 mg to 100 mg.  The patient had a follow-up with Dr. [name] yesterday and he was found to be hyperreligious, disorganized, and was voicing suicidal ideation.  Per nursing notes in the ER "Pt states it's all because of the computer and he would like to go back in time and shoot whoever invented the computer. Pt states he was not suicidal until he got here. Pt states he now thinks he should kill himself and take seven or eight others with him."   12/20 patient reports his doing "excellent". He denies problems with mood, appetite, energy, sleep or concentration. Patient says deny he is going to be visited by his wife and the sheriff who is going to get him out of this mess. Patient has pressured speech, psychomotor agitation, requires significant redirection as he does not abstain topic, although his answers are tangential. Per nursing is that he has been eating well. He is slept 6 hours last night and has been compliant with medications.  12/21 patient reports he is doing wonderful. Denies problems with his sleep, appetite, energy or concentration. He says that his concentration has never been better. He grades his energy has a 12 out of 10 x 10 being the best. He denies side effects from his medications or having any physical complaints. He denies  suicidality, homicidality or auditory or visual hallucinations. Patient and her conversation and spoke about time traveling, teaching small children about the assassination of [place]. Helping the sheriff get reelected. Banning  computers in the world and ending mass shootings.  12/22 patient says he is doing excellent. He continues to have pressure speech, psychomotor agitation, tangential thought processes. He still hyper religious. He tells me today he is Jesus Christ because he is walking in his shoes. He tells me I'm going to heaven. He goes on tangents. He denies depressed mood, suicidality, homicidality or auditory or visual hallucinations. Patient denies having any physical complaints or side effects from medications. Patient has been attending groups and is disruptive  12/23 patient denies having any issues or complaints today. He continues to be very tangentiality and now he is talking about Jasiyah Dively. It appears to me he was less disorganized than before. He still hyper religious and continues to talk about salvation. He continues to have psychomotor agitation but not as severe as it was for the last 2 days. Per nursing is that he has been compliant with medication and he has been is sleeping well. He denies having problems with mood, appetite, energy is sleep or concentration. Denies suicidality, homicidality or laboratory or visual hallucinations and as physical complaints or having side effects from his medications  12/24 patient slept well last night. He has been compliant with medications. She can be intrusive at times but no aggression or agitation. He continues to be hyperreligious. Today he  tells me he is [place]  and also Jesus Christ but later on he says he knows he is not the real [employer]. He continues to talk about salvation and sins. He denies SI, HI or auditory or visual hallucinations. He denies side effects from medications or any physical complaints.  12/25 appears that  euphoria, psychomotor agitation have been gradually decreasing. His speech is not as pressured. Patient does not appear to be as intrusive. He still clearly manic but not as severe as he was prior to admission. This morning he was cheerful and bright. He has been sleeping well. He has been compliant with medications. He has been eating well. He denies any side effects to medications. He denies any physical complaints. He denies problems with energy, sleep appetite or concentration.   Follow-up for the 26th. Patient seen. Chart reviewed. Patient tells me that he has done "a complete 180" because he is feeling great. Apparently he does not remember that the whole reason we admitted him to the hospital as his euphoria. His euphoria seems to of tone down slightly but not completely and he still seems to be thinking in an agitated manner. He is smiling and upbeat. Poorly kempt. Denies suicidal or homicidal ideation. Sleeping better. No medical complaints. Tolerating medicine okay.  Per nursing: Pt denies SI/HI/AVH. Attended evening group. Hyper verbal, Tangential. Restless. Medication compliant. Spent time in milieu socializing in day room after evening wrap up group. Voices no additional concerns at this time. Safety maintained. Will continue to monitor.  Principal Problem: Bipolar affective disorder, current episode manic with psychotic symptoms ([place]) Diagnosis:   Patient Active Problem List   Diagnosis Date Noted  . Coronary artery disease [I25.10] 09/24/2016  . Glaucoma [H40.9] 09/24/2016  . Bipolar affective disorder, current episode manic with psychotic symptoms ([place]) [F31.2] 09/23/2016  . Chronic obstructive pulmonary disease ([place]) [J44.9] 11/06/2015  . Gastro-esophageal reflux disease without esophagitis [K21.9] 10/24/2014  . Obstructive apnea [G47.33] 10/24/2014  . Restless leg [G25.81] 10/24/2014   Total Time spent with patient: 30 minutes  Past Psychiatric History: Patient denies any  prior history of mental illness prior to summer of this year . Patient denies history of psychiatric hospitalizations or suicidal attempts   Past Medical History:  Past Medical History:  Diagnosis Date  . Arterial vascular disease 10/24/2014   Overview:  MI 1994, small-vessel disease requiring coumadin   . Asthma   . Centrilobular emphysema ([place]) 10/24/2014   Overview:  2L O2 prn   . Chronic obstructive pulmonary disease ([place]) 11/06/2015  . GERD (gastroesophageal reflux disease)   . Glaucoma   . Heart attack 2/17, 1994  . Heart disease   . Hyperlipemia   . Hypertension   . Restless leg 10/24/2014  . Sleep apnea     Past Surgical History:  Procedure Laterality Date  . CATARACT EXTRACTION W/ INTRAOCULAR LENS  IMPLANT, BILATERAL     Family History:  Family History  Problem Relation Age of Onset  . Nephrolithiasis Father   . Prostate cancer Neg Hx   . Bladder Cancer Neg Hx   . Kidney cancer Neg Hx    Family Psychiatric  History: Patient reports his brother died of liver disease related to alcoholism. He denies any history of mental illness in his family or suicides.   Social History: Patient lives with his wife. He has been married for 29 year. They do not have any children. Patient Retired from work in the [employer]. He has some college  education. He does not have any military experience. As far as his legal history his raises serious charges related to child pornography  History  Alcohol Use No     History  Drug Use No    Social History   Social History  . Marital status: Married    Spouse name: N/A  . Number of children: N/A  . Years of education: N/A   Social History Main Topics  . Smoking status: Former Research scientist (life sciences)  . Smokeless tobacco: Never Used  . Alcohol use No  . Drug use: No  . Sexual activity: Not Asked   Other Topics Concern  . None   Social History Narrative  . None    Current Medications: Current Facility-Administered Medications  Medication Dose  Route Frequency Provider Last Rate Last Dose  . acetaminophen (TYLENOL) tablet 650 mg  650 mg Oral Q6H PRN Gonzella Lex, MD      . albuterol (PROVENTIL HFA;VENTOLIN HFA) 108 (90 Base) MCG/ACT inhaler 2 puff  2 puff Inhalation Q4H PRN Gonzella Lex, MD      . alum & mag hydroxide-simeth (MAALOX/MYLANTA) 200-200-20 MG/5ML suspension 30 mL  30 mL Oral Q4H PRN Gonzella Lex, MD      . aspirin EC tablet 81 mg  81 mg Oral Daily Gonzella Lex, MD   81 mg at 10/01/16 0903  . atenolol (TENORMIN) tablet 50 mg  50 mg Oral Daily Gonzella Lex, MD   50 mg at 10/01/16 [id number]  . atorvastatin (LIPITOR) tablet 80 mg  80 mg Oral q1800 Gonzella Lex, MD   80 mg at 09/30/16 1709  . divalproex (DEPAKOTE ER) 24 hr tablet 1,500 mg  1,500 mg Oral QHS Hildred Priest, MD   1,500 mg at 09/30/16 2122  . feeding supplement (ENSURE ENLIVE) (ENSURE ENLIVE) liquid 237 mL  237 mL Oral BID BM Hildred Priest, MD   237 mL at 10/01/16 1400  . fluticasone (FLONASE) 50 MCG/ACT nasal spray 1 spray  1 spray Each Nare Daily PRN Hildred Priest, MD      . latanoprost (XALATAN) 0.005 % ophthalmic solution 1 drop  1 drop Both Eyes QHS Hildred Priest, MD   1 drop at 09/30/16 2122  . lisinopril (PRINIVIL,ZESTRIL) tablet 5 mg  5 mg Oral Daily Gonzella Lex, MD   5 mg at 10/01/16 0902  . LORazepam (ATIVAN) tablet 1 mg  1 mg Oral Q6H PRN Hildred Priest, MD   1 mg at 09/25/16 0940  . LORazepam (ATIVAN) tablet 2 mg  2 mg Oral QHS Hildred Priest, MD   2 mg at 09/30/16 2122  . magnesium hydroxide (MILK OF MAGNESIA) suspension 30 mL  30 mL Oral Daily PRN Gonzella Lex, MD      . mometasone-formoterol [facility]) 200-5 MCG/ACT inhaler 2 puff  2 puff Inhalation BID Gonzella Lex, MD   2 puff at 10/01/16 [phone]  . OLANZapine (ZYPREXA) tablet 10 mg  10 mg Oral QHS Gonzella Lex, MD   10 mg at 09/30/16 2123  . OLANZapine (ZYPREXA) tablet 5 mg  5 mg Oral BID Hildred Priest, MD   5  mg at 10/01/16 1144  . ondansetron (ZOFRAN-ODT) disintegrating tablet 8 mg  8 mg Oral Q8H PRN Hildred Priest, MD      . pantoprazole (PROTONIX) EC tablet 40 mg  40 mg Oral Daily Gonzella Lex, MD   40 mg at 10/01/16 0902  . ticagrelor (BRILINTA) tablet 90 mg  90  mg Oral BID Gonzella Lex, MD   90 mg at 10/01/16 [id number]  . timolol (TIMOPTIC) 0.5 % ophthalmic solution 1 drop  1 drop Both Eyes Daily Hildred Priest, MD   1 drop at 10/01/16 0907  . tiotropium (SPIRIVA) inhalation capsule 18 mcg  18 mcg Inhalation Daily Hildred Priest, MD   18 mcg at 10/01/16 0800    Lab Results:  No results found for this or any previous visit (from the past 48 hour(s)).  Blood Alcohol level:  Lab Results  Component Value Date   ETH <5 [id number]    Metabolic Disorder Labs: Lab Results  Component Value Date   HGBA1C 5.2 09/24/2016   MPG 103 09/24/2016   Lab Results  Component Value Date   PROLACTIN 65.6 (H) 09/24/2016   Lab Results  Component Value Date   CHOL 177 09/24/2016   TRIG 126 09/24/2016   HDL 41 09/24/2016   CHOLHDL 4.3 09/24/2016   VLDL 25 09/24/2016   LDLCALC 111 (H) 09/24/2016    Physical Findings: AIMS:  , ,  ,  , Dental Status Current problems with teeth and/or dentures?: No Does patient usually wear dentures?: No  CIWA:    COWS:     Musculoskeletal: Strength & Muscle Tone: within normal limits Gait & Station: normal Patient leans: N/A  Psychiatric Specialty Exam: Physical Exam  Nursing note and vitals reviewed. Constitutional: He is oriented to person, place, and time. He appears well-developed and well-nourished.  HENT:  Head: Normocephalic and atraumatic.  Eyes: Conjunctivae and EOM are normal. Pupils are equal, round, and reactive to light.  Neck: Normal range of motion.  Cardiovascular: Normal heart sounds.   Respiratory: Effort normal.  GI: Soft.  Musculoskeletal: Normal range of motion.  Neurological: He is alert and  oriented to person, place, and time.  Skin: Skin is warm and dry.  Psychiatric: His speech is normal and behavior is normal. His affect is inappropriate. Cognition and memory are normal. He expresses no suicidal ideation.    Review of Systems  Constitutional: Negative.   HENT: Negative.   Eyes: Negative.   Respiratory: Negative.   Cardiovascular: Negative.   Gastrointestinal: Negative.   Genitourinary: Negative.   Musculoskeletal: Negative.   Skin: Negative.   Neurological: Negative.   Endo/Heme/Allergies: Negative.   Psychiatric/Behavioral: Negative.     Blood pressure 111/76, pulse 86, temperature 98.6 F (37 C), temperature source Oral, resp. rate 19, height 5\' 6"  (1.676 m), weight 75.8 kg (167 lb), SpO2 95 %.Body mass index is 26.95 kg/m.  General Appearance: Disheveled  Eye Contact:  Good  Speech:  Pressured  Volume:  Normal  Mood:  Euphoric  Affect:  Labile  Thought Process:  Irrelevant and Descriptions of Associations: Tangential  Orientation:  Full (Time, Place, and Person)  Thought Content:  Hallucinations: None  Suicidal Thoughts:  No  Homicidal Thoughts:  No  Memory:  Immediate;   Poor Recent;   Poor Remote;   Poor  Judgement:  Impaired  Insight:  Lacking  Psychomotor Activity:  Increased  Concentration:  Concentration: Poor and Attention Span: Poor  Recall:  Poor  Fund of Knowledge:  Good  Language:  Good  Akathisia:  No  Handed:    AIMS (if indicated):     Assets:  Agricultural consultant Housing Social Support  ADL's:  Intact  Cognition:  Impaired,  Moderate  Sleep:  Number of Hours: 6     Treatment Plan Summary:  Mild improvement, Has been is  sleeping well for the last 4  nights and appears to have less psychomotor agitation. No changes in medications today. We will need to check a Depakote level on Wednesday night before his evening dose  Patient is a 70 year old married Caucasian who is presenting with mania.    For bipolar disorder the patient has been started on olanzapine. On December 20 the dose was increased from 15mg /d to 20 mg q/day (currently he is on 5 mg of Zyprexa in the morning, 5 mg at lunch and 10 mg in the evening)--no changes today appears to be improving  Depakote ER on 12/22, continue 1500 mg at bedtime. Will need a level on 12/27 bedtime  For insomnia: Patient slept 6.5 h last night. Continue Ativan 2 mg by mouth daily at bedtime  For hypertension: continued on lisinopril and atenolol. VS have been stable.  Today his BP was a little low.  Meds for BP were held.  Will recheck BP at 12pm and offer meds then if BP betetr  For cardiovascular disease continue Brilinta  For COPD: continued on albuterol, dulera and spiriva.  For dyslipidemia:  continued on Lipitor  For GERD: continued on Protonix  For glaucoma continue eyedrops  For restless leg syndrome continue Neurontin 300 mg daily at bedtime--- will d/c for now as pt on depakote  Vitamin B12 deficiency. His vitamin B12 is 189. Continue vitamin B12 for a total of 7 days, then weekly  Labs: Ammonia was within the normal limits, cholesterol was within the normal limits, hemoglobin A1c was 5.2, TSH was within the normal limits, HIV was nonreactive, RPR was nonreactive, UA was clear. Urine toxicology was negative.   EKG shows sinus bradycardia with a QTC of 434.  Brain MRI: Mild cerebrovascular disease without any other abnormalities.  No change to medication. Continue on mood stabilizers. Blood pressure was up a little bit last night but that her today. No other change to medical medicine. Asian has no concerns except discharge time but is not argumentative about continued hospitalization.  Collateral information: I have reviewed records and notes from his primary care was started treating him for depression back in September. I contacted the patient's wife had [phone] her name is Ariyan Sotello. She reports  patient started having manic symptoms on Saturday. He was talking nonstop. Wanting to deliver Christmas present to 7 AM to somebody's house on Sunday morning. He was becoming more agitated and confrontational with his wife on Sunday. She denies any prior history of symptoms like this. There is also no family history of mental illness that she is aware of.    Spoke with Dr [name] on 12/20 and 12/21.  Spoke with wife again on 12/21.  Alethia Berthold, MD 10/01/2016, 3:07 PM

## 2016-10-01 NOTE — Progress Notes (Signed)
Patient pleasant and cooperative, denies SI, HI, AVH. Pt less intrusive. Pt with decreased hyper religion. Pt reports nose bleeds, pt did have dried blood to nose. Pt to notify nurse with next nose bleed.  Encouragement and support offered. Encouraged patient to attend group. Medications given as prescribed. Patient receptive, med and group compliant. Remains safe on unit with q 15 min checks.

## 2016-10-01 NOTE — Plan of Care (Signed)
Problem: Safety: Goal: Ability to remain free from injury will improve Outcome: Progressing Patient slept for Estimated Hours of 6; no PRN given, 15 minute checks maintained for safety, clinical and moral support provided, patient encouraged to continue to express feelings and demonstrate safe care. Patient remains free from harm and injury, will continue to monitor.

## 2016-10-01 NOTE — [facility] Group Notes (Signed)
[facility] LCSW Group Therapy Note  Date/Time: 10/01/16 1500  Type of Therapy and Topic:  Group Therapy:  Overcoming Obstacles  Participation Level:  active  Description of Group:    In this group patients will be encouraged to explore what they see as obstacles to their own wellness and recovery. They will be guided to discuss their thoughts, feelings, and behaviors related to these obstacles. The group will process together ways to cope with barriers, with attention given to specific choices patients can make. Each patient will be challenged to identify changes they are motivated to make in order to overcome their obstacles. This group will be process-oriented, with patients participating in exploration of their own experiences as well as giving and receiving support and challenge from other group members.  Therapeutic Goals: 1. Patient will identify personal and current obstacles as they relate to admission. 2. Patient will identify barriers that currently interfere with their wellness or overcoming obstacles.  3. Patient will identify feelings, thought process and behaviors related to these barriers. 4. Patient will identify two changes they are willing to make to overcome these obstacles:    Summary of Patient Progress: Pt continues to have some difficulty restraining himself from making comments throughout the group and talking over other participants.  Today pt was having side conversations with another pt, requiring redirection several times.  Pt identified an obstacle in his life of "telling the truth" but was unable to give an explanation of what that means.     Therapeutic Modalities:   Cognitive Behavioral Therapy Solution Focused Therapy Motivational Interviewing Relapse Prevention Therapy  Lurline Idol, LCSW

## 2016-10-01 NOTE — Progress Notes (Signed)
Patient ID: Bradley Bowers, male   DOB: [birth year], 70 y.o.   MRN: [id number]  Hypomanic, still intrusive, restless, pacing, difficult to remain still, but attended the Wrap-Up Group, loud at times, tangential type of loose association, thoughts are scattered but redirectable. Less grandiosity, less hyper religiosity; monitored frequently for falls/injury due to blood thinner that he is taking; he has bruises and dressing to the left elbow and bandade to the other arm; no bleeding noted. Over night, randomly out of room lost, re-oriented and redirected back to his room.

## 2016-10-02 NOTE — Tx Team (Signed)
Interdisciplinary Treatment and Diagnostic Plan Update  10/02/2016 Time of Session: 10:30am Bradley Bowers MRN: [id number]  Principal Diagnosis: Bipolar affective disorder, current episode manic with psychotic symptoms ([place])  Secondary Diagnoses: Principal Problem:   Bipolar affective disorder, current episode manic with psychotic symptoms ([place]) Active Problems:   Chronic obstructive pulmonary disease ([place])   Gastro-esophageal reflux disease without esophagitis   Obstructive apnea   Restless leg   Coronary artery disease   Glaucoma   Current Medications:  Current Facility-Administered Medications  Medication Dose Route Frequency Provider Last Rate Last Dose  . acetaminophen (TYLENOL) tablet 650 mg  650 mg Oral Q6H PRN Gonzella Lex, MD      . albuterol (PROVENTIL HFA;VENTOLIN HFA) 108 (90 Base) MCG/ACT inhaler 2 puff  2 puff Inhalation Q4H PRN Gonzella Lex, MD      . alum & mag hydroxide-simeth (MAALOX/MYLANTA) 200-200-20 MG/5ML suspension 30 mL  30 mL Oral Q4H PRN Gonzella Lex, MD      . aspirin EC tablet 81 mg  81 mg Oral Daily Gonzella Lex, MD   81 mg at 10/02/16 [id number]  . atenolol (TENORMIN) tablet 50 mg  50 mg Oral Daily Gonzella Lex, MD   50 mg at 10/01/16 [id number]  . atorvastatin (LIPITOR) tablet 80 mg  80 mg Oral q1800 Gonzella Lex, MD   80 mg at 10/01/16 1700  . divalproex (DEPAKOTE ER) 24 hr tablet 1,500 mg  1,500 mg Oral QHS Hildred Priest, MD   1,500 mg at 10/01/16 2122  . feeding supplement (ENSURE ENLIVE) (ENSURE ENLIVE) liquid 237 mL  237 mL Oral BID BM Hildred Priest, MD   237 mL at 10/02/16 1033  . fluticasone (FLONASE) 50 MCG/ACT nasal spray 1 spray  1 spray Each Nare Daily PRN Hildred Priest, MD      . latanoprost (XALATAN) 0.005 % ophthalmic solution 1 drop  1 drop Both Eyes QHS Hildred Priest, MD   1 drop at 10/01/16 2121  . lisinopril (PRINIVIL,ZESTRIL) tablet 5 mg  5 mg Oral Daily Gonzella Lex, MD   5 mg at  10/01/16 0902  . LORazepam (ATIVAN) tablet 1 mg  1 mg Oral Q6H PRN Hildred Priest, MD   1 mg at 09/25/16 0940  . LORazepam (ATIVAN) tablet 2 mg  2 mg Oral QHS Hildred Priest, MD   2 mg at 10/01/16 2122  . magnesium hydroxide (MILK OF MAGNESIA) suspension 30 mL  30 mL Oral Daily PRN Gonzella Lex, MD      . mometasone-formoterol [facility]) 200-5 MCG/ACT inhaler 2 puff  2 puff Inhalation BID Gonzella Lex, MD   2 puff at 10/02/16 0818  . OLANZapine (ZYPREXA) tablet 10 mg  10 mg Oral QHS Gonzella Lex, MD   10 mg at 10/01/16 2123  . OLANZapine (ZYPREXA) tablet 5 mg  5 mg Oral BID Hildred Priest, MD   5 mg at 10/02/16 1135  . ondansetron (ZOFRAN-ODT) disintegrating tablet 8 mg  8 mg Oral Q8H PRN Hildred Priest, MD      . pantoprazole (PROTONIX) EC tablet 40 mg  40 mg Oral Daily Gonzella Lex, MD   40 mg at 10/02/16 0816  . ticagrelor (BRILINTA) tablet 90 mg  90 mg Oral BID Gonzella Lex, MD   90 mg at 10/02/16 0816  . timolol (TIMOPTIC) 0.5 % ophthalmic solution 1 drop  1 drop Both Eyes Daily Hildred Priest, MD   1 drop at 10/02/16 0818  .  tiotropium (SPIRIVA) inhalation capsule 18 mcg  18 mcg Inhalation Daily Hildred Priest, MD   18 mcg at 10/02/16 0818   PTA Medications: Prescriptions Prior to Admission  Medication Sig Dispense Refill Last Dose  . albuterol (PROAIR HFA) 108 (90 Base) MCG/ACT inhaler Inhale into the lungs.   Taking  . aspirin 81 MG EC tablet TAKE 1 TABLET (81 MG TOTAL) BY MOUTH ONCE DAILY.  11 Taking  . atenolol (TENORMIN) 50 MG tablet TAKE 1 TABLET (50 MG TOTAL) BY MOUTH ONCE DAILY.  1 Taking  . atorvastatin (LIPITOR) 80 MG tablet TAKE 1 TABLET (80 MG TOTAL) BY MOUTH ONCE DAILY.  1 Taking  . Calcium-Magnesium-Vitamin D (CALCIUM+D3 GRADUAL RELEASE) 600-40-500 MG-MG-UNIT TB24 Take by mouth.   Taking  . fluticasone (FLONASE) 50 MCG/ACT nasal spray 1 spray by Each Nare route daily.   Taking  . Fluticasone-Salmeterol  (ADVAIR DISKUS) 250-50 MCG/DOSE AEPB Inhale into the lungs.   Taking  . gabapentin (NEURONTIN) 300 MG capsule Take 300 mg by mouth.   Taking  . lisinopril (PRINIVIL,ZESTRIL) 5 MG tablet TAKE 1 TABLET (5 MG TOTAL) BY MOUTH ONCE DAILY.  11 Taking  . methocarbamol (ROBAXIN) 500 MG tablet Take 500 mg by mouth 3 (three) times daily as needed.  1 Taking  . omeprazole (PRILOSEC) 40 MG capsule TAKE 1 CAPSULE (40 MG TOTAL) BY MOUTH 2 (TWO) TIMES DAILY.  3 Taking  . ticagrelor (BRILINTA) 90 MG TABS tablet Take by mouth.   Taking  . timolol (TIMOPTIC) 0.5 % ophthalmic solution USE 1 DROP(S) IN BOTH EYES ONCE A DAY  5 Taking  . tiotropium (SPIRIVA) 18 MCG inhalation capsule Place into inhaler and inhale.   Taking  . Travoprost, BAK Free, (TRAVATAN Z) 0.004 % SOLN ophthalmic solution Frequency:PHARMDIR   Dosage:0.0     Instructions:  Note:1 gtt in each eye q pm Dose: 1   Taking    Patient Stressors: Financial difficulties Health problems Legal issue  Patient Strengths: General fund of knowledge Supportive family/friends  Treatment Modalities: Medication Management, Group therapy, Case management,  1 to 1 session with clinician, Psychoeducation, Recreational therapy.   Physician Treatment Plan for Primary Diagnosis: Bipolar affective disorder, current episode manic with psychotic symptoms ([place]) Long Term Goal(s): Improvement in symptoms so as ready for discharge Improvement in symptoms so as ready for discharge   Short Term Goals: Ability to verbalize feelings will improve Ability to disclose and discuss suicidal ideas Ability to demonstrate self-control will improve Ability to identify and develop effective coping behaviors will improve Ability to identify changes in lifestyle to reduce recurrence of condition will improve Ability to demonstrate self-control will improve Ability to identify and develop effective coping behaviors will improve Ability to identify triggers associated with substance  abuse/mental health issues will improve  Medication Management: Evaluate patient's response, side effects, and tolerance of medication regimen.  Therapeutic Interventions: 1 to 1 sessions, Unit Group sessions and Medication administration.  Evaluation of Outcomes: Progressing  Physician Treatment Plan for Secondary Diagnosis: Principal Problem:   Bipolar affective disorder, current episode manic with psychotic symptoms ([place]) Active Problems:   Chronic obstructive pulmonary disease ([facility])   Gastro-esophageal reflux disease without esophagitis   Obstructive apnea   Restless leg   Coronary artery disease   Glaucoma  Long Term Goal(s): Improvement in symptoms so as ready for discharge Improvement in symptoms so as ready for discharge   Short Term Goals: Ability to verbalize feelings will improve Ability to disclose and discuss suicidal ideas Ability  to demonstrate self-control will improve Ability to identify and develop effective coping behaviors will improve Ability to identify changes in lifestyle to reduce recurrence of condition will improve Ability to demonstrate self-control will improve Ability to identify and develop effective coping behaviors will improve Ability to identify triggers associated with substance abuse/mental health issues will improve     Medication Management: Evaluate patient's response, side effects, and tolerance of medication regimen.  Therapeutic Interventions: 1 to 1 sessions, Unit Group sessions and Medication administration.  Evaluation of Outcomes: Progressing   RN Treatment Plan for Primary Diagnosis: Bipolar affective disorder, current episode manic with psychotic symptoms ([place]) Long Term Goal(s): Knowledge of disease and therapeutic regimen to maintain health will improve  Short Term Goals: Ability to participate in decision making will improve, Ability to identify and develop effective coping behaviors will improve and Compliance with  prescribed medications will improve  Medication Management: RN will administer medications as ordered by provider, will assess and evaluate patient's response and provide education to patient for prescribed medication. RN will report any adverse and/or side effects to prescribing provider.  Therapeutic Interventions: 1 on 1 counseling sessions, Psychoeducation, Medication administration, Evaluate responses to treatment, Monitor vital signs and CBGs as ordered, Perform/monitor CIWA, COWS, AIMS and Fall Risk screenings as ordered, Perform wound care treatments as ordered.  Evaluation of Outcomes: Progressing   LCSW Treatment Plan for Primary Diagnosis: Bipolar affective disorder, current episode manic with psychotic symptoms ([place]) Long Term Goal(s): Safe transition to appropriate next level of care at discharge, Engage patient in therapeutic group addressing interpersonal concerns.  Short Term Goals: Engage patient in aftercare planning with referrals and resources, Increase social support, Increase ability to appropriately verbalize feelings, Increase emotional regulation, Facilitate acceptance of mental health diagnosis and concerns and Increase skills for wellness and recovery  Therapeutic Interventions: Assess for all discharge needs, 1 to 1 time with Social worker, Explore available resources and support systems, Assess for adequacy in community support network, Educate family and significant other(s) on suicide prevention, Complete Psychosocial Assessment, Interpersonal group therapy.  Evaluation of Outcomes: Progressing   Progress in Treatment: Attending groups: Yes. Participating in groups: Yes. Taking medication as prescribed: Yes. Toleration medication: Yes. Family/Significant other contact made: No.  Will continue attempts to contact pt family. Patient understands diagnosis: Yes. Discussing patient identified problems/goals with staff: Yes. Medical problems stabilized or resolved:  Yes. Denies suicidal/homicidal ideation: Yes. Issues/concerns per patient self-inventory: No Other: none  New problem(s) identified: No, Describe:  none listed   New Short Term/Long Term Goal(s):  Discharge Plan or Barriers: CSW still assessing for appropriate referrals  Reason for Continuation of Hospitalization: Anxiety Delusions  Depression Mania Suicidal ideation  Estimated Length of Stay:  Attendees: Patient: Bradley Bowers 09/25/2016 10:46 AM  Physician: Dr. Weber Cooks, MD 09/25/2016 10:46 AM  Nursing: Carolynn Sayers, R 09/25/2016 10:46 AM  RN Care Manager: 09/25/2016 10:46 AM  Social Worker: Vangie Bicker, LCSW 09/25/2016 10:46 AM  Recreational Therapist: Everitt Amber, [place] 09/25/2016 10:46 AM  Other:  09/25/2016 10:46 AM  Other:  09/25/2016 10:46 AM  Other: 09/25/2016 10:46 AM    Scribe for Treatment Team: Lurline Idol, LCSW 10/02/2016

## 2016-10-02 NOTE — Progress Notes (Signed)
[facility] MD Progress Note  10/02/2016 12:14 PM KORDAE BUONOCORE  MRN:  [id number] Subjective:  Patient is a 70 year old married Caucasian male who was brought in by his outpatient psychiatrist to our emergency department on December 18.   In August of this year the patient was charged with child pornography after being found with pictures of minors in his cell phone. Patient has a court hearing for his charges in January. In September he was started on Prozac by his primary care provider and was referred to follow-up with Dr. [name] (outpatient psychiatrist). At some point in time the patient was switched from fluoxetine to sertraline which was increased from 50 mg to 100 mg.  The patient had a follow-up with Dr. [name] yesterday and he was found to be hyperreligious, disorganized, and was voicing suicidal ideation.  Per nursing notes in the ER "Pt states it's all because of the computer and he would like to go back in time and shoot whoever invented the computer. Pt states he was not suicidal until he got here. Pt states he now thinks he should kill himself and take seven or eight others with him."   12/20 patient reports his doing "excellent". He denies problems with mood, appetite, energy, sleep or concentration. Patient says deny he is going to be visited by his wife and the sheriff who is going to get him out of this mess. Patient has pressured speech, psychomotor agitation, requires significant redirection as he does not abstain topic, although his answers are tangential. Per nursing is that he has been eating well. He is slept 6 hours last night and has been compliant with medications.  12/21 patient reports he is doing wonderful. Denies problems with his sleep, appetite, energy or concentration. He says that his concentration has never been better. He grades his energy has a 12 out of 10 x 10 being the best. He denies side effects from his medications or having any physical complaints. He denies  suicidality, homicidality or auditory or visual hallucinations. Patient and her conversation and spoke about time traveling, teaching small children about the assassination of [place]. Helping the sheriff get reelected. Banning  computers in the world and ending mass shootings.  12/22 patient says he is doing excellent. He continues to have pressure speech, psychomotor agitation, tangential thought processes. He still hyper religious. He tells me today he is Jesus Christ because he is walking in his shoes. He tells me I'm going to heaven. He goes on tangents. He denies depressed mood, suicidality, homicidality or auditory or visual hallucinations. Patient denies having any physical complaints or side effects from medications. Patient has been attending groups and is disruptive  12/23 patient denies having any issues or complaints today. He continues to be very tangentiality and now he is talking about Cristen [place]. It appears to me he was less disorganized than before. He still hyper religious and continues to talk about salvation. He continues to have psychomotor agitation but not as severe as it was for the last 2 days. Per nursing is that he has been compliant with medication and he has been is sleeping well. He denies having problems with mood, appetite, energy is sleep or concentration. Denies suicidality, homicidality or laboratory or visual hallucinations and as physical complaints or having side effects from his medications  12/24 patient slept well last night. He has been compliant with medications. She can be intrusive at times but no aggression or agitation. He continues to be hyperreligious. Today he  tells me he is [place]  and also Jesus Christ but later on he says he knows he is not the real [employer]. He continues to talk about salvation and sins. He denies SI, HI or auditory or visual hallucinations. He denies side effects from medications or any physical complaints.  12/25 appears that  euphoria, psychomotor agitation have been gradually decreasing. His speech is not as pressured. Patient does not appear to be as intrusive. He still clearly manic but not as severe as he was prior to admission. This morning he was cheerful and bright. He has been sleeping well. He has been compliant with medications. He has been eating well. He denies any side effects to medications. He denies any physical complaints. He denies problems with energy, sleep appetite or concentration.   Follow-up for the 26th. Patient seen. Chart reviewed. Patient tells me that he has done "a complete 180" because he is feeling great. Apparently he does not remember that the whole reason we admitted him to the hospital as his euphoria. His euphoria seems to of tone down slightly but not completely and he still seems to be thinking in an agitated manner. He is smiling and upbeat. Poorly kempt. Denies suicidal or homicidal ideation. Sleeping better. No medical complaints. Tolerating medicine okay.  Follow-up for the 27th. Patient seen. Patient met with treatment team. His only question was about discharge timing. He was not argumentative about it. On evaluation today the patient continues to be hyperverbal and a little overly euphoric. Doesn't seem to be as grandiose. Doesn't make as many grandiose statements but is still slightly confused and shows some flight of ideas. Tolerating medicine well. Depakote level is 89 which is a very appropriate level. No sign of any new physical distress.  Per nursing: Pt denies SI/HI/AVH. Attended evening group. Hyper verbal, Tangential. Restless. Medication compliant. Spent time in milieu socializing in day room after evening wrap up group. Voices no additional concerns at this time. Safety maintained. Will continue to monitor.  Principal Problem: Bipolar affective disorder, current episode manic with psychotic symptoms ([place]) Diagnosis:   Patient Active Problem List   Diagnosis Date Noted   . Coronary artery disease [I25.10] 09/24/2016  . Glaucoma [H40.9] 09/24/2016  . Bipolar affective disorder, current episode manic with psychotic symptoms ([place]) [F31.2] 09/23/2016  . Chronic obstructive pulmonary disease ([place]) [J44.9] 11/06/2015  . Gastro-esophageal reflux disease without esophagitis [K21.9] 10/24/2014  . Obstructive apnea [G47.33] 10/24/2014  . Restless leg [G25.81] 10/24/2014   Total Time spent with patient: 30 minutes  Past Psychiatric History: Patient denies any prior history of mental illness prior to summer of this year . Patient denies history of psychiatric hospitalizations or suicidal attempts   Past Medical History:  Past Medical History:  Diagnosis Date  . Arterial vascular disease 10/24/2014   Overview:  MI 1994, small-vessel disease requiring coumadin   . Asthma   . Centrilobular emphysema ([place]) 10/24/2014   Overview:  2L O2 prn   . Chronic obstructive pulmonary disease ([place]) 11/06/2015  . GERD (gastroesophageal reflux disease)   . Glaucoma   . Heart attack 2/17, 1994  . Heart disease   . Hyperlipemia   . Hypertension   . Restless leg 10/24/2014  . Sleep apnea     Past Surgical History:  Procedure Laterality Date  . CATARACT EXTRACTION W/ INTRAOCULAR LENS  IMPLANT, BILATERAL     Family History:  Family History  Problem Relation Age of Onset  . Nephrolithiasis Father   .  Prostate cancer Neg Hx   . Bladder Cancer Neg Hx   . Kidney cancer Neg Hx    Family Psychiatric  History: Patient reports his brother died of liver disease related to alcoholism. He denies any history of mental illness in his family or suicides.   Social History: Patient lives with his wife. He has been married for 29 year. They do not have any children. Patient Retired from work in the [employer]. He has some college education. He does not have any military experience. As far as his legal history his raises serious charges related to child pornography  History  Alcohol Use  No     History  Drug Use No    Social History   Social History  . Marital status: Married    Spouse name: N/A  . Number of children: N/A  . Years of education: N/A   Social History Main Topics  . Smoking status: Former Research scientist (life sciences)  . Smokeless tobacco: Never Used  . Alcohol use No  . Drug use: No  . Sexual activity: Not Asked   Other Topics Concern  . None   Social History Narrative  . None    Current Medications: Current Facility-Administered Medications  Medication Dose Route Frequency Provider Last Rate Last Dose  . acetaminophen (TYLENOL) tablet 650 mg  650 mg Oral Q6H PRN Gonzella Lex, MD      . albuterol (PROVENTIL HFA;VENTOLIN HFA) 108 (90 Base) MCG/ACT inhaler 2 puff  2 puff Inhalation Q4H PRN Gonzella Lex, MD      . alum & mag hydroxide-simeth (MAALOX/MYLANTA) 200-200-20 MG/5ML suspension 30 mL  30 mL Oral Q4H PRN Gonzella Lex, MD      . aspirin EC tablet 81 mg  81 mg Oral Daily Gonzella Lex, MD   81 mg at 10/02/16 7353  . atenolol (TENORMIN) tablet 50 mg  50 mg Oral Daily Gonzella Lex, MD   50 mg at 10/01/16 2992  . atorvastatin (LIPITOR) tablet 80 mg  80 mg Oral q1800 Gonzella Lex, MD   80 mg at 10/01/16 1700  . divalproex (DEPAKOTE ER) 24 hr tablet 1,500 mg  1,500 mg Oral QHS Hildred Priest, MD   1,500 mg at 10/01/16 2122  . feeding supplement (ENSURE ENLIVE) (ENSURE ENLIVE) liquid 237 mL  237 mL Oral BID BM Hildred Priest, MD   237 mL at 10/02/16 1033  . fluticasone (FLONASE) 50 MCG/ACT nasal spray 1 spray  1 spray Each Nare Daily PRN Hildred Priest, MD      . latanoprost (XALATAN) 0.005 % ophthalmic solution 1 drop  1 drop Both Eyes QHS Hildred Priest, MD   1 drop at 10/01/16 2121  . lisinopril (PRINIVIL,ZESTRIL) tablet 5 mg  5 mg Oral Daily Gonzella Lex, MD   5 mg at 10/01/16 0902  . LORazepam (ATIVAN) tablet 1 mg  1 mg Oral Q6H PRN Hildred Priest, MD   1 mg at 09/25/16 0940  . LORazepam (ATIVAN)  tablet 2 mg  2 mg Oral QHS Hildred Priest, MD   2 mg at 10/01/16 2122  . magnesium hydroxide (MILK OF MAGNESIA) suspension 30 mL  30 mL Oral Daily PRN Gonzella Lex, MD      . mometasone-formoterol [facility]) 200-5 MCG/ACT inhaler 2 puff  2 puff Inhalation BID Gonzella Lex, MD   2 puff at 10/02/16 0818  . OLANZapine (ZYPREXA) tablet 10 mg  10 mg Oral QHS Gonzella Lex, MD  10 mg at 10/01/16 2123  . OLANZapine (ZYPREXA) tablet 5 mg  5 mg Oral BID Hildred Priest, MD   5 mg at 10/02/16 1135  . ondansetron (ZOFRAN-ODT) disintegrating tablet 8 mg  8 mg Oral Q8H PRN Hildred Priest, MD      . pantoprazole (PROTONIX) EC tablet 40 mg  40 mg Oral Daily Gonzella Lex, MD   40 mg at 10/02/16 0816  . ticagrelor (BRILINTA) tablet 90 mg  90 mg Oral BID Gonzella Lex, MD   90 mg at 10/02/16 0816  . timolol (TIMOPTIC) 0.5 % ophthalmic solution 1 drop  1 drop Both Eyes Daily Hildred Priest, MD   1 drop at 10/02/16 0818  . tiotropium (SPIRIVA) inhalation capsule 18 mcg  18 mcg Inhalation Daily Hildred Priest, MD   18 mcg at 10/02/16 0818    Lab Results:  Results for orders placed or performed during the hospital encounter of 09/23/16 (from the past 48 hour(s))  Valproic acid level     Status: None   Collection Time: 10/01/16  8:27 PM  Result Value Ref Range   Valproic Acid Lvl 89 50.0 - 100.0 ug/mL    Blood Alcohol level:  Lab Results  Component Value Date   ETH <5 77/41/2878    Metabolic Disorder Labs: Lab Results  Component Value Date   HGBA1C 5.2 09/24/2016   MPG 103 09/24/2016   Lab Results  Component Value Date   PROLACTIN 65.6 (H) 09/24/2016   Lab Results  Component Value Date   CHOL 177 09/24/2016   TRIG 126 09/24/2016   HDL 41 09/24/2016   CHOLHDL 4.3 09/24/2016   VLDL 25 09/24/2016   LDLCALC 111 (H) 09/24/2016    Physical Findings: AIMS:  , ,  ,  , Dental Status Current problems with teeth and/or dentures?: No Does  patient usually wear dentures?: No  CIWA:    COWS:     Musculoskeletal: Strength & Muscle Tone: within normal limits Gait & Station: normal Patient leans: N/A  Psychiatric Specialty Exam: Physical Exam  Nursing note and vitals reviewed. Constitutional: He is oriented to person, place, and time. He appears well-developed and well-nourished.  HENT:  Head: Normocephalic and atraumatic.  Eyes: Conjunctivae and EOM are normal. Pupils are equal, round, and reactive to light.  Neck: Normal range of motion.  Cardiovascular: Normal heart sounds.   Respiratory: Effort normal.  GI: Soft.  Musculoskeletal: Normal range of motion.  Neurological: He is alert and oriented to person, place, and time.  Skin: Skin is warm and dry.  Psychiatric: His speech is normal and behavior is normal. His affect is inappropriate. Cognition and memory are normal. He expresses no suicidal ideation.    Review of Systems  Constitutional: Negative.   HENT: Negative.   Eyes: Negative.   Respiratory: Negative.   Cardiovascular: Negative.   Gastrointestinal: Negative.   Genitourinary: Negative.   Musculoskeletal: Negative.   Skin: Negative.   Neurological: Negative.   Endo/Heme/Allergies: Negative.   Psychiatric/Behavioral: Negative.     Blood pressure (!) 104/54, pulse (!) 58, temperature 98.1 F (36.7 C), temperature source Oral, resp. rate 18, height 5' 6"  (1.676 m), weight 75.8 kg (167 lb), SpO2 95 %.Body mass index is 26.95 kg/m.  General Appearance: Disheveled  Eye Contact:  Good  Speech:  Pressured  Volume:  Normal  Mood:  Euphoric  Affect:  Labile  Thought Process:  Irrelevant and Descriptions of Associations: Tangential  Orientation:  Full (Time, Place, and Person)  Thought Content:  Hallucinations: None  Suicidal Thoughts:  No  Homicidal Thoughts:  No  Memory:  Immediate;   Poor Recent;   Poor Remote;   Poor  Judgement:  Impaired  Insight:  Lacking  Psychomotor Activity:  Increased   Concentration:  Concentration: Poor and Attention Span: Poor  Recall:  Poor  Fund of Knowledge:  Good  Language:  Good  Akathisia:  No  Handed:    AIMS (if indicated):     Assets:  Agricultural consultant Housing Social Support  ADL's:  Intact  Cognition:  Impaired,  Moderate  Sleep:  Number of Hours: 5.45     Treatment Plan Summary:  Mild improvement, Has been is sleeping well for the last 4  nights and appears to have less psychomotor agitation. No changes in medications today. We will need to check a Depakote level on Wednesday night before his evening dose  Patient is a 70 year old married Caucasian who is presenting with mania.   For bipolar disorder the patient has been started on olanzapine. On December 20 the dose was increased from 53m/d to 20 mg q/day (currently he is on 5 mg of Zyprexa in the morning, 5 mg at lunch and 10 mg in the evening)--no changes today appears to be improving  Depakote ER on 12/22, continue 1500 mg at bedtime. Will need a level on 12/27 bedtime  For insomnia: Patient slept 6.5 h last night. Continue Ativan 2 mg by mouth daily at bedtime  For hypertension: continued on lisinopril and atenolol. VS have been stable.  Today his BP was a little low.  Meds for BP were held.  Will recheck BP at 12pm and offer meds then if BP betetr  For cardiovascular disease continue Brilinta  For COPD: continued on albuterol, dulera and spiriva.  For dyslipidemia:  continued on Lipitor  For GERD: continued on Protonix  For glaucoma continue eyedrops  For restless leg syndrome continue Neurontin 300 mg daily at bedtime--- will d/c for now as pt on depakote  Vitamin B12 deficiency. His vitamin B12 is 189. Continue vitamin B12 for a total of 7 days, then weekly  Labs: Ammonia was within the normal limits, cholesterol was within the normal limits, hemoglobin A1c was 5.2, TSH was within the normal limits, HIV was nonreactive,  RPR was nonreactive, UA was clear. Urine toxicology was negative.   EKG shows sinus bradycardia with a QTC of 434.  Brain MRI: Mild cerebrovascular disease without any other abnormalities.  Follow-up plan as of the 26th. No change in medication. Continue on current Depakote dose. Supportive counseling and encouragement to continue to attend groups. Patient's family is still visiting regularly. He appears to still be manic and slightly confused but somewhat improved from before presentation. Not quite as hyper religious or agitated. Still disheveled with odd presentation. No plan yet for discharge.  Collateral information: I have reviewed records and notes from his primary care was started treating him for depression back in September. I contacted the patient's wife had [phone] name is LIam Lipson She reports patient started having manic symptoms on Saturday. He was talking nonstop. Wanting to deliver Christmas present to 7 AM to somebody's house on Sunday morning. He was becoming more agitated and confrontational with his wife on Sunday. She denies any prior history of symptoms like this. There is also no family history of mental illness that she is aware of.    Spoke with Dr KNicolasa Duckingon 12/20 and 12/21.  Spoke with  wife again on 12/21.  Alethia Berthold, MD 10/02/2016, 12:14 PM

## 2016-10-02 NOTE — BHH Group Notes (Signed)
[facility] LCSW Group Therapy  1:00PM Type of Therapy: Group Therapy   Participation Level: Active  Participation Quality: Attentive, Sharing and Supportive  Affect: Appropriate   Cognitive: Alert and Oriented   Insight: Developing/Improving and Engaged   Engagement in Therapy: Developing/Improving and Engaged   Modes of Intervention: Clarification, Confrontation, Discussion, Education, Exploration, Limit-setting, Orientation, Problem-solving, Rapport Building, Art therapist, Socialization and Support   Summary of Progress/Problems: The topic for group today was emotional regulation. This group focused on both positive and negative emotion identification and allowed  group members to process ways to identify feelings, regulate negative emotions, and find healthy ways to manage internal/external emotions. Group members were asked to reflect on a time when their reaction to an emotion led to a negative outcome and explored how alternative responses using emotion regulation would have benefited them. Group members were also asked to discuss a time when emotion regulation was utilized when a negative emotion was experienced. Patient was able to identify a negative emotion and/or behavior that he wanted to work on regulating. The patient was very engaging throughout the group and dominated group at times, but was easily redirected. The patient identified his religion as a major coping skill for him.  Georga Kaufmann, MSW, LCSWA 10/02/16, 3:01 PM

## 2016-10-02 NOTE — Progress Notes (Signed)
D: Patient stated slept good last night .Stated appetite is good and energy level  Is normal. Stated concentration is good . Stated on Depression scale 0, hopeless 0 and anxiety 2 .( low 0-10 high) Denies suicidal  homicidal ideations  .  No auditory hallucinations  No pain concerns .ADL'S. Need to be encourage  Interacting with peers and staff. Patient remained   Manic  But not as bad as when he came to the unit . Able to make complete sentences  Patient  Remained focused on going home  A: Encourage patient participation with unit programming . Instruction  Given on  Medication , verbalize understanding. R: Voice no other concerns. Staff continue to monitor

## 2016-10-02 NOTE — Plan of Care (Signed)
Problem: Safety: Goal: Ability to remain free from injury will improve Outcome: Progressing Patient slept for Estimated Hours of 5.45; safety maintained, no injury or falls during this shift.

## 2016-10-02 NOTE — Progress Notes (Signed)
Patient ID: Bradley Bowers, male   DOB: 08-13-1946, 70 y.o.   MRN: [id number] Patient is less intrusive, mood and affect observed to be better, less disheveled, still tangential in his conversation but medication compliant, less restless; denied SI/HI.

## 2016-10-02 NOTE — BHH Suicide Risk Assessment (Signed)
[place] INPATIENT:  Family/Significant Other Suicide Prevention Education Late entry for Bradley Bowers, Bradley Bowers  Suicide Prevention Education:  Contact Attempts: Bradley Bowers, wife,, (name of family member/significant other) has been identified by the patient as the family member/significant other with whom the patient will be residing, and identified as the person(s) who will aid the patient in the event of a mental health crisis.  With written consent from the patient, two attempts were made to provide suicide prevention education, prior to and/or following the patient's discharge.  We were unsuccessful in providing suicide prevention education.  A suicide education pamphlet was given to the patient to share with family/significant other.  Date and time of first attempt: 12/20 by CSW Bowers  Date and time of second attempt: 12/20 by CSW Bowers  Per progress note from [place] on 12/20  "CSW attempted to call patient's wife at number provided by the pt to the [place] [facility] at ph: [phone] at at the number on the pt;s face sheet for the pt's wife Bradley Bowers at ph: [phone] and the number for each said phone was no longer in service. CSW completed SPE with the pt.    Bradley Bowers, LCSWA, LCAS  09/25/16"    Bradley Bowers 10/02/2016, 11:32 AM

## 2016-10-02 NOTE — Plan of Care (Signed)
Problem: Education: Goal: Will be free of psychotic symptoms Outcome: Progressing Patient remains less manic and psychotic

## 2016-10-02 NOTE — Progress Notes (Signed)
Recreation Therapy Notes  Date: 12.27.17 Time: 9:30 am Location: Craft Room  Group Topic: Self-esteem  Goal Area(s) Addresses:  Patient will write at least one positive trait about self. Patient will verbalize benefit of having healthy self-esteem.  Behavioral Response: Attentive, Interactive  Intervention: I Am  Activity: Patients were given a worksheet with the letter I on it and were instructed to write positive traits about themselves inside the letter.  Education: LRT educated patients on ways they can increase their self-esteem.  Education Outcome: In group clarification offered   Clinical Observations/Feedback: Patient wrote, "I believe in [place]" on his worksheet. Patient contributed to group discussion by stating what can make it difficult to think of positive traits. Patient had some side conversations with peer.  Leonette Monarch, LRT/CTRS 10/02/2016 11:48 AM

## 2016-10-03 NOTE — Plan of Care (Signed)
Problem: Activity: Goal: Will verbalize the importance of balancing activity with adequate rest periods Outcome: Progressing Patient verbalized feelings to staff.

## 2016-10-03 NOTE — Progress Notes (Signed)
D: Pt denies SI/HI/AVH, affect is flat but brightens upon approach. Patient's speech is non pressured, not hype religious during the shift. Pt is pleasant, cooperative and redirectable. . Pt stated appears less anxious and he is interacting with peers and staff appropriately.  A: Pt was offered support and encouragement. Pt was given scheduled medications. Pt was encouraged to attend groups. Q 15 minute checks were done for safety.  R:Pt attends groups and interacts well with peers and staff. Pt is taking medication. Pt has no complaints.Pt receptive to treatment and safety maintained on unit.

## 2016-10-03 NOTE — BHH Group Notes (Signed)
[facility] Group Notes:  (Nursing/MHT/Case Management/Adjunct)  Date:  10/03/2016  Time:  12:41 AM  Type of Therapy:  Group Therapy  Participation Level:  Active  Participation Quality:  Attentive and Redirectable  Affect:  Excited  Cognitive:  Alert and Disorganized  Insight:  Lacking  Engagement in Group:  Off Topic  Modes of Intervention:  Discussion  Summary of Progress/Problems: When asked about his goal. Pt was pleasant, but off topic. It was hard for staff to follow what was his exact goal. Pt interrupted his fellow pts, but would apologize for doing so.  Jenetta Downer Angeligue Bowne 10/03/2016, 12:41 AM

## 2016-10-03 NOTE — Progress Notes (Signed)
[facility] MD Progress Note  10/03/2016 8:04 PM Bradley Bowers  MRN:  [id number] Subjective:  Patient is a 70 year old married Caucasian male who was brought in by his outpatient psychiatrist to our emergency department on December 18.   In August of this year the patient was charged with child pornography after being found with pictures of minors in his cell phone. Patient has a court hearing for his charges in January. In September he was started on Prozac by his primary care provider and was referred to follow-up with Dr. Nicolasa Bowers (outpatient psychiatrist). At some point in time the patient was switched from fluoxetine to sertraline which was increased from 50 mg to 100 mg.  The patient had a follow-up with Dr. Nicolasa Bowers yesterday and he was found to be hyperreligious, disorganized, and was voicing suicidal ideation.  Per nursing notes in the ER "Pt states it's all because of the computer and he would like to go back in time and shoot whoever invented the computer. Pt states he was not suicidal until he got here. Pt states he now thinks he should kill himself and take seven or eight others with him."   12/20 patient reports his doing "excellent". He denies problems with mood, appetite, energy, sleep or concentration. Patient says deny he is going to be visited by his wife and the sheriff who is going to get him out of this mess. Patient has pressured speech, psychomotor agitation, requires significant redirection as he does not abstain topic, although his answers are tangential. Per nursing is that he has been eating well. He is slept 6 hours last night and has been compliant with medications.  12/21 patient reports he is doing wonderful. Denies problems with his sleep, appetite, energy or concentration. He says that his concentration has never been better. He grades his energy has a 12 out of 10 x 10 being the best. He denies side effects from his medications or having any physical complaints. He denies  suicidality, homicidality or auditory or visual hallucinations. Patient and her conversation and spoke about time traveling, teaching small children about the assassination of Bradley Bowers. Helping the sheriff get reelected. Banning  computers in the world and ending mass shootings.  12/22 patient says he is doing excellent. He continues to have pressure speech, psychomotor agitation, tangential thought processes. He still hyper religious. He tells me today he is Jesus Christ because he is walking in his shoes. He tells me I'm going to heaven. He goes on tangents. He denies depressed mood, suicidality, homicidality or auditory or visual hallucinations. Patient denies having any physical complaints or side effects from medications. Patient has been attending groups and is disruptive  12/23 patient denies having any issues or complaints today. He continues to be very tangentiality and now he is talking about Bradley Bowers. It appears to me he was less disorganized than before. He still hyper religious and continues to talk about salvation. He continues to have psychomotor agitation but not as severe as it was for the last 2 days. Per nursing is that he has been compliant with medication and he has been is sleeping well. He denies having problems with mood, appetite, energy is sleep or concentration. Denies suicidality, homicidality or laboratory or visual hallucinations and as physical complaints or having side effects from his medications  12/24 patient slept well last night. He has been compliant with medications. She can be intrusive at times but no aggression or agitation. He continues to be hyperreligious. Today he  tells me he is Bradley Bowers  and also Jesus Christ but later on he says he knows he is not the real Bradley Bowers. He continues to talk about salvation and sins. He denies SI, HI or auditory or visual hallucinations. He denies side effects from medications or any physical complaints.  12/25 appears that  euphoria, psychomotor agitation have been gradually decreasing. His speech is not as pressured. Patient does not appear to be as intrusive. He still clearly manic but not as severe as he was prior to admission. This morning he was cheerful and bright. He has been sleeping well. He has been compliant with medications. He has been eating well. He denies any side effects to medications. He denies any physical complaints. He denies problems with energy, sleep appetite or concentration.   Follow-up for the 26th. Patient seen. Chart reviewed. Patient tells me that he has done "a complete 180" because he is feeling great. Apparently he does not remember that the whole reason we admitted him to the hospital as his euphoria. His euphoria seems to of tone down slightly but not completely and he still seems to be thinking in an agitated manner. He is smiling and upbeat. Poorly kempt. Denies suicidal or homicidal ideation. Sleeping better. No medical complaints. Tolerating medicine okay.  Follow-up for the 27th. Patient seen. Patient met with treatment team. His only question was about discharge timing. He was not argumentative about it. On evaluation today the patient continues to be hyperverbal and a little overly euphoric. Doesn't seem to be as grandiose. Doesn't make as many grandiose statements but is still slightly confused and shows some flight of ideas. Tolerating medicine well. Depakote level is 89 which is a very appropriate level. No sign of any new physical distress.  Follow-up on the 28th. Patient seen today and he also attended treatment team. Patient went commitment court today and the involuntary commitment was upheld. He didn't seem can particularly bothered by this although not sure how well he really understood it. Patient is a little bit calmer but still seems inappropriately euphoric and not to quite understand his situation well. His discussion is still fairly inappropriate in terms of being overly  jolly. Nevertheless he is tolerating medication and perhaps getting slightly better.  Per nursing: Pt denies SI/HI/AVH. Attended evening group. Hyper verbal, Tangential. Restless. Medication compliant. Spent time in milieu socializing in day room after evening wrap up group. Voices no additional concerns at this time. Safety maintained. Will continue to monitor.  Principal Problem: Bipolar affective disorder, current episode manic with psychotic symptoms ([place]) Diagnosis:   Patient Active Problem List   Diagnosis Date Noted  . Coronary artery disease [I25.10] 09/24/2016  . Glaucoma [H40.9] 09/24/2016  . Bipolar affective disorder, current episode manic with psychotic symptoms ([place]) [F31.2] 09/23/2016  . Chronic obstructive pulmonary disease ([place]) [J44.9] 11/06/2015  . Gastro-esophageal reflux disease without esophagitis [K21.9] 10/24/2014  . Obstructive apnea [G47.33] 10/24/2014  . Restless leg [G25.81] 10/24/2014   Total Time spent with patient: 30 minutes  Past Psychiatric History: Patient denies any prior history of mental illness prior to summer of this year . Patient denies history of psychiatric hospitalizations or suicidal attempts   Past Medical History:  Past Medical History:  Diagnosis Date  . Arterial vascular disease 10/24/2014   Overview:  MI 1994, small-vessel disease requiring coumadin   . Asthma   . Centrilobular emphysema ([address]) 10/24/2014   Overview:  2L O2 prn   . Chronic obstructive pulmonary disease (HCC)  11/06/2015  . GERD (gastroesophageal reflux disease)   . Glaucoma   . Heart attack 2/17, 1994  . Heart disease   . Hyperlipemia   . Hypertension   . Restless leg 10/24/2014  . Sleep apnea     Past Surgical History:  Procedure Laterality Date  . CATARACT EXTRACTION W/ INTRAOCULAR LENS  IMPLANT, BILATERAL     Family History:  Family History  Problem Relation Age of Onset  . Nephrolithiasis Father   . Prostate cancer Neg Hx   . Bladder Cancer Neg Hx   .  Kidney cancer Neg Hx    Family Psychiatric  History: Patient reports his brother died of liver disease related to alcoholism. He denies any history of mental illness in his family or suicides.   Social History: Patient lives with his wife. He has been married for 29 year. They do not have any children. Patient Retired from work in the [employer]. He has some college education. He does not have any military experience. As far as his legal history his raises serious charges related to child pornography  History  Alcohol Use No     History  Drug Use No    Social History   Social History  . Marital status: Married    Spouse name: N/A  . Number of children: N/A  . Years of education: N/A   Social History Main Topics  . Smoking status: Former Research scientist (life sciences)  . Smokeless tobacco: Never Used  . Alcohol use No  . Drug use: No  . Sexual activity: Not Asked   Other Topics Concern  . None   Social History Narrative  . None    Current Medications: Current Facility-Administered Medications  Medication Dose Route Frequency Provider Last Rate Last Dose  . acetaminophen (TYLENOL) tablet 650 mg  650 mg Oral Q6H PRN Gonzella Lex, MD      . albuterol (PROVENTIL HFA;VENTOLIN HFA) 108 (90 Base) MCG/ACT inhaler 2 puff  2 puff Inhalation Q4H PRN Gonzella Lex, MD      . alum & mag hydroxide-simeth (MAALOX/MYLANTA) 200-200-20 MG/5ML suspension 30 mL  30 mL Oral Q4H PRN Gonzella Lex, MD      . aspirin EC tablet 81 mg  81 mg Oral Daily Gonzella Lex, MD   81 mg at 10/03/16 0818  . atenolol (TENORMIN) tablet 50 mg  50 mg Oral Daily Gonzella Lex, MD   50 mg at 10/03/16 0818  . atorvastatin (LIPITOR) tablet 80 mg  80 mg Oral q1800 Gonzella Lex, MD   80 mg at 10/02/16 1745  . divalproex (DEPAKOTE ER) 24 hr tablet 1,500 mg  1,500 mg Oral QHS Hildred Priest, MD   1,500 mg at 10/02/16 2157  . feeding supplement (ENSURE ENLIVE) (ENSURE ENLIVE) liquid 237 mL  237 mL Oral BID BM Hildred Priest, MD   237 mL at 10/03/16 1400  . fluticasone (FLONASE) 50 MCG/ACT nasal spray 1 spray  1 spray Each Nare Daily PRN Hildred Priest, MD      . latanoprost (XALATAN) 0.005 % ophthalmic solution 1 drop  1 drop Both Eyes QHS Hildred Priest, MD   1 drop at 10/02/16 2158  . lisinopril (PRINIVIL,ZESTRIL) tablet 5 mg  5 mg Oral Daily Gonzella Lex, MD   5 mg at 10/03/16 0817  . LORazepam (ATIVAN) tablet 1 mg  1 mg Oral Q6H PRN Hildred Priest, MD   1 mg at 09/25/16 0940  . LORazepam (ATIVAN) tablet  2 mg  2 mg Oral QHS Hildred Priest, MD   2 mg at 10/01/16 2122  . magnesium hydroxide (MILK OF MAGNESIA) suspension 30 mL  30 mL Oral Daily PRN Gonzella Lex, MD      . mometasone-formoterol [facility]) 200-5 MCG/ACT inhaler 2 puff  2 puff Inhalation BID Gonzella Lex, MD   2 puff at 10/03/16 [phone]  . OLANZapine (ZYPREXA) tablet 10 mg  10 mg Oral QHS Gonzella Lex, MD   10 mg at 10/02/16 2220  . OLANZapine (ZYPREXA) tablet 5 mg  5 mg Oral BID Hildred Priest, MD   5 mg at 10/03/16 1306  . ondansetron (ZOFRAN-ODT) disintegrating tablet 8 mg  8 mg Oral Q8H PRN Hildred Priest, MD      . pantoprazole (PROTONIX) EC tablet 40 mg  40 mg Oral Daily Gonzella Lex, MD   40 mg at 10/03/16 0818  . ticagrelor (BRILINTA) tablet 90 mg  90 mg Oral BID Gonzella Lex, MD   90 mg at 10/03/16 1552  . timolol (TIMOPTIC) 0.5 % ophthalmic solution 1 drop  1 drop Both Eyes Daily Hildred Priest, MD   1 drop at 10/03/16 0819  . tiotropium (SPIRIVA) inhalation capsule 18 mcg  18 mcg Inhalation Daily Hildred Priest, MD   18 mcg at 10/03/16 0818    Lab Results:  Results for orders placed or performed during the hospital encounter of 09/23/16 (from the past 48 hour(s))  Valproic acid level     Status: None   Collection Time: 10/01/16  8:27 PM  Result Value Ref Range   Valproic Acid Lvl 89 50.0 - 100.0 ug/mL    Blood Alcohol level:   Lab Results  Component Value Date   ETH <5 40/98/1191    Metabolic Disorder Labs: Lab Results  Component Value Date   HGBA1C 5.2 09/24/2016   MPG 103 09/24/2016   Lab Results  Component Value Date   PROLACTIN 65.6 (H) 09/24/2016   Lab Results  Component Value Date   CHOL 177 09/24/2016   TRIG 126 09/24/2016   HDL 41 09/24/2016   CHOLHDL 4.3 09/24/2016   VLDL 25 09/24/2016   LDLCALC 111 (H) 09/24/2016    Physical Findings: AIMS:  , ,  ,  , Dental Status Current problems with teeth and/or dentures?: No Does patient usually wear dentures?: No  CIWA:    COWS:     Musculoskeletal: Strength & Muscle Tone: within normal limits Gait & Station: normal Patient leans: N/A  Psychiatric Specialty Exam: Physical Exam  Nursing note and vitals reviewed. Constitutional: He is oriented to person, place, and time. He appears well-developed and well-nourished.  HENT:  Head: Normocephalic and atraumatic.  Eyes: Conjunctivae and EOM are normal. Pupils are equal, round, and reactive to light.  Neck: Normal range of motion.  Cardiovascular: Normal heart sounds.   Respiratory: Effort normal.  GI: Soft.  Musculoskeletal: Normal range of motion.  Neurological: He is alert and oriented to person, place, and time.  Skin: Skin is warm and dry.  Psychiatric: His speech is normal and behavior is normal. His affect is inappropriate. Cognition and memory are normal. He expresses no suicidal ideation.    Review of Systems  Constitutional: Negative.   HENT: Negative.   Eyes: Negative.   Respiratory: Negative.   Cardiovascular: Negative.   Gastrointestinal: Negative.   Genitourinary: Negative.   Musculoskeletal: Negative.   Skin: Negative.   Neurological: Negative.   Endo/Heme/Allergies: Negative.   Psychiatric/Behavioral: Negative.  Blood pressure (!) 142/79, pulse 75, temperature 97.7 F (36.5 C), temperature source Oral, resp. rate 18, height _0  (1.676 m), weight 75.8 kg  (167 lb), SpO2 95 %.Body mass index is 26.95 kg/m.  General Appearance: Disheveled  Eye Contact:  Good  Speech:  Pressured  Volume:  Normal  Mood:  Euphoric  Affect:  Labile  Thought Process:  Irrelevant and Descriptions of Associations: Tangential  Orientation:  Full (Time, Place, and Person)  Thought Content:  Hallucinations: None  Suicidal Thoughts:  No  Homicidal Thoughts:  No  Memory:  Immediate;   Poor Recent;   Poor Remote;   Poor  Judgement:  Impaired  Insight:  Lacking  Psychomotor Activity:  Increased  Concentration:  Concentration: Poor and Attention Span: Poor  Recall:  Poor  Fund of Knowledge:  Good  Language:  Good  Akathisia:  No  Handed:    AIMS (if indicated):     Assets:  Agricultural consultant Housing Social Support  ADL's:  Intact  Cognition:  Impaired,  Moderate  Sleep:  Number of Hours: 4.15     Treatment Plan Summary:  Mild improvement, Has been is sleeping well for the last 4  nights and appears to have less psychomotor agitation. No changes in medications today. We will need to check a Depakote level on Wednesday night before his evening dose  Patient is a 70 year old married Caucasian who is presenting with mania.   For bipolar disorder the patient has been started on olanzapine. On December 20 the dose was increased from 70m/d to 20 mg q/day (currently he is on 5 mg of Zyprexa in the morning, 5 mg at lunch and 10 mg in the evening)--no changes today appears to be improving  Depakote ER on 12/22, continue 1500 mg at bedtime. Will need a level on 12/27 bedtime  For insomnia: Patient slept 6.5 h last night. Continue Ativan 2 mg by mouth daily at bedtime  For hypertension: continued on lisinopril and atenolol. VS have been stable.  Today his BP was a little low.  Meds for BP were held.  Will recheck BP at 12pm and offer meds then if BP betetr  For cardiovascular disease continue Brilinta  For COPD: continued  on albuterol, dulera and spiriva.  For dyslipidemia:  continued on Lipitor  For GERD: continued on Protonix  For glaucoma continue eyedrops  For restless leg syndrome continue Neurontin 300 mg daily at bedtime--- will d/c for now as pt on depakote  Vitamin B12 deficiency. His vitamin B12 is 189. Continue vitamin B12 for a total of 7 days, then weekly  Labs: Ammonia was within the normal limits, cholesterol was within the normal limits, hemoglobin A1c was 5.2, TSH was within the normal limits, HIV was nonreactive, RPR was nonreactive, UA was clear. Urine toxicology was negative.   EKG shows sinus bradycardia with a QTC of 434.  Brain MRI: Mild cerebrovascular disease without any other abnormalities.  Follow-up plan as of the 26th. No change in medication. Continue on current Depakote dose. Supportive counseling and encouragement to continue to attend groups. Patient's family is still visiting regularly. He appears to still be manic and slightly confused but somewhat improved from before presentation. Not quite as hyper religious or agitated. Still disheveled with odd presentation. No plan yet for discharge.  No change to medicine today. Gradual improvement noted. Vital signs stable. Collateral information: I have reviewed records and notes from his primary care was started treating him for depression back  in September. I contacted the patient's wife had [phone] her name is Bradley Bowers. She reports patient started having manic symptoms on Saturday. He was talking nonstop. Wanting to deliver Christmas present to 7 AM to somebody's house on Sunday morning. He was becoming more agitated and confrontational with his wife on Sunday. She denies any prior history of symptoms like this. There is also no family history of mental illness that she is aware of.    Spoke with Dr Bradley Bowers on 12/20 and 12/21.  Spoke with wife again on 12/21.  Alethia Berthold, MD 10/03/2016, 8:04 PM

## 2016-10-03 NOTE — [facility] Counselor (Signed)
[facility] LCSW Group Therapy  ? 10/03/2016 1pm  ? Type of Therapy: Group Therapy  ? Participation Level: Active  ? Participation Quality: Attentive, Sharing and Supportive  ? Affect: Appropriate Cognitive: Alert and Oriented  ? Insight: Developing/Improving and Engaged  ? Engagement in Therapy: Developing/Improving and Engaged  ? Modes of Intervention: Clarification, Confrontation, Discussion, Education, Exploration, Limit-setting, Orientation, Problem-solving, Rapport Building, Art therapist, Socialization and Support  ? Summary of Progress/Problems: The topic for group was balance in life. Today's group focused on defining balance in one's own words, identifying things that can knock one off balance, and exploring healthy ways to maintain balance in life. Group members were asked to provide an example of a time when they felt off balance, describe how they handled that situation, and process healthier ways to regain balance in the future. Group members were asked to share the most important tool for maintaining balance that they learned while at [facility] and how they plan to apply this method after discharge. Pt was engaged and was in a positive mood for the duration of the group. Pt states that his life is relatively balanced at the moment. Pt also reported that his faith is his main coping skill that helps with keeping his life balanced.   Georga Kaufmann, MSW, LCSWA 10/03/16, 3:42 PM

## 2016-10-03 NOTE — Tx Team (Signed)
Interdisciplinary Treatment and Diagnostic Plan Update  10/03/2016 Time of Session: 10:30am Bradley Bowers MRN: [id number]  Principal Diagnosis: Bipolar affective disorder, current episode manic with psychotic symptoms ([place])  Secondary Diagnoses: Principal Problem:   Bipolar affective disorder, current episode manic with psychotic symptoms ([place]) Active Problems:   Chronic obstructive pulmonary disease ([place])   Gastro-esophageal reflux disease without esophagitis   Obstructive apnea   Restless leg   Coronary artery disease   Glaucoma   Current Medications:  Current Facility-Administered Medications  Medication Dose Route Frequency Provider Last Rate Last Dose  . acetaminophen (TYLENOL) tablet 650 mg  650 mg Oral Q6H PRN Gonzella Lex, MD      . albuterol (PROVENTIL HFA;VENTOLIN HFA) 108 (90 Base) MCG/ACT inhaler 2 puff  2 puff Inhalation Q4H PRN Gonzella Lex, MD      . alum & mag hydroxide-simeth (MAALOX/MYLANTA) 200-200-20 MG/5ML suspension 30 mL  30 mL Oral Q4H PRN Gonzella Lex, MD      . aspirin EC tablet 81 mg  81 mg Oral Daily Gonzella Lex, MD   81 mg at 10/03/16 0818  . atenolol (TENORMIN) tablet 50 mg  50 mg Oral Daily Gonzella Lex, MD   50 mg at 10/03/16 0818  . atorvastatin (LIPITOR) tablet 80 mg  80 mg Oral q1800 Gonzella Lex, MD   80 mg at 10/02/16 1745  . divalproex (DEPAKOTE ER) 24 hr tablet 1,500 mg  1,500 mg Oral QHS Hildred Priest, MD   1,500 mg at 10/02/16 2157  . feeding supplement (ENSURE ENLIVE) (ENSURE ENLIVE) liquid 237 mL  237 mL Oral BID BM Hildred Priest, MD   237 mL at 10/03/16 0816  . fluticasone (FLONASE) 50 MCG/ACT nasal spray 1 spray  1 spray Each Nare Daily PRN Hildred Priest, MD      . latanoprost (XALATAN) 0.005 % ophthalmic solution 1 drop  1 drop Both Eyes QHS Hildred Priest, MD   1 drop at 10/02/16 2158  . lisinopril (PRINIVIL,ZESTRIL) tablet 5 mg  5 mg Oral Daily Gonzella Lex, MD   5 mg at  10/03/16 0817  . LORazepam (ATIVAN) tablet 1 mg  1 mg Oral Q6H PRN Hildred Priest, MD   1 mg at 09/25/16 0940  . LORazepam (ATIVAN) tablet 2 mg  2 mg Oral QHS Hildred Priest, MD   2 mg at 10/01/16 2122  . magnesium hydroxide (MILK OF MAGNESIA) suspension 30 mL  30 mL Oral Daily PRN Gonzella Lex, MD      . mometasone-formoterol [facility]) 200-5 MCG/ACT inhaler 2 puff  2 puff Inhalation BID Gonzella Lex, MD   2 puff at 10/03/16 [phone]  . OLANZapine (ZYPREXA) tablet 10 mg  10 mg Oral QHS Gonzella Lex, MD   10 mg at 10/02/16 2220  . OLANZapine (ZYPREXA) tablet 5 mg  5 mg Oral BID Hildred Priest, MD   5 mg at 10/03/16 0818  . ondansetron (ZOFRAN-ODT) disintegrating tablet 8 mg  8 mg Oral Q8H PRN Hildred Priest, MD      . pantoprazole (PROTONIX) EC tablet 40 mg  40 mg Oral Daily Gonzella Lex, MD   40 mg at 10/03/16 0818  . ticagrelor (BRILINTA) tablet 90 mg  90 mg Oral BID Gonzella Lex, MD   90 mg at 10/03/16 0818  . timolol (TIMOPTIC) 0.5 % ophthalmic solution 1 drop  1 drop Both Eyes Daily Hildred Priest, MD   1 drop at 10/03/16 0819  .  tiotropium (SPIRIVA) inhalation capsule 18 mcg  18 mcg Inhalation Daily Hildred Priest, MD   18 mcg at 10/03/16 0818   PTA Medications: Prescriptions Prior to Admission  Medication Sig Dispense Refill Last Dose  . albuterol (PROAIR HFA) 108 (90 Base) MCG/ACT inhaler Inhale into the lungs.   Taking  . aspirin 81 MG EC tablet TAKE 1 TABLET (81 MG TOTAL) BY MOUTH ONCE DAILY.  11 Taking  . atenolol (TENORMIN) 50 MG tablet TAKE 1 TABLET (50 MG TOTAL) BY MOUTH ONCE DAILY.  1 Taking  . atorvastatin (LIPITOR) 80 MG tablet TAKE 1 TABLET (80 MG TOTAL) BY MOUTH ONCE DAILY.  1 Taking  . Calcium-Magnesium-Vitamin D (CALCIUM+D3 GRADUAL RELEASE) 600-40-500 MG-MG-UNIT TB24 Take by mouth.   Taking  . fluticasone (FLONASE) 50 MCG/ACT nasal spray 1 spray by Each Nare route daily.   Taking  . Fluticasone-Salmeterol  (ADVAIR DISKUS) 250-50 MCG/DOSE AEPB Inhale into the lungs.   Taking  . gabapentin (NEURONTIN) 300 MG capsule Take 300 mg by mouth.   Taking  . lisinopril (PRINIVIL,ZESTRIL) 5 MG tablet TAKE 1 TABLET (5 MG TOTAL) BY MOUTH ONCE DAILY.  11 Taking  . methocarbamol (ROBAXIN) 500 MG tablet Take 500 mg by mouth 3 (three) times daily as needed.  1 Taking  . omeprazole (PRILOSEC) 40 MG capsule TAKE 1 CAPSULE (40 MG TOTAL) BY MOUTH 2 (TWO) TIMES DAILY.  3 Taking  . ticagrelor (BRILINTA) 90 MG TABS tablet Take by mouth.   Taking  . timolol (TIMOPTIC) 0.5 % ophthalmic solution USE 1 DROP(S) IN BOTH EYES ONCE A DAY  5 Taking  . tiotropium (SPIRIVA) 18 MCG inhalation capsule Place into inhaler and inhale.   Taking  . Travoprost, BAK Free, (TRAVATAN Z) 0.004 % SOLN ophthalmic solution Frequency:PHARMDIR   Dosage:0.0     Instructions:  Note:1 gtt in each eye q pm Dose: 1   Taking    Patient Stressors: Financial difficulties Health problems Legal issue  Patient Strengths: General fund of knowledge Supportive family/friends  Treatment Modalities: Medication Management, Group therapy, Case management,  1 to 1 session with clinician, Psychoeducation, Recreational therapy.   Physician Treatment Plan for Primary Diagnosis: Bipolar affective disorder, current episode manic with psychotic symptoms ([place]) Long Term Goal(s): Improvement in symptoms so as ready for discharge Improvement in symptoms so as ready for discharge   Short Term Goals: Ability to verbalize feelings will improve Ability to disclose and discuss suicidal ideas Ability to demonstrate self-control will improve Ability to identify and develop effective coping behaviors will improve Ability to identify changes in lifestyle to reduce recurrence of condition will improve Ability to demonstrate self-control will improve Ability to identify and develop effective coping behaviors will improve Ability to identify triggers associated with substance  abuse/mental health issues will improve  Medication Management: Evaluate patient's response, side effects, and tolerance of medication regimen.  Therapeutic Interventions: 1 to 1 sessions, Unit Group sessions and Medication administration.  Evaluation of Outcomes: Progressing  Physician Treatment Plan for Secondary Diagnosis: Principal Problem:   Bipolar affective disorder, current episode manic with psychotic symptoms ([place]) Active Problems:   Chronic obstructive pulmonary disease ([facility])   Gastro-esophageal reflux disease without esophagitis   Obstructive apnea   Restless leg   Coronary artery disease   Glaucoma  Long Term Goal(s): Improvement in symptoms so as ready for discharge Improvement in symptoms so as ready for discharge   Short Term Goals: Ability to verbalize feelings will improve Ability to disclose and discuss suicidal ideas Ability  to demonstrate self-control will improve Ability to identify and develop effective coping behaviors will improve Ability to identify changes in lifestyle to reduce recurrence of condition will improve Ability to demonstrate self-control will improve Ability to identify and develop effective coping behaviors will improve Ability to identify triggers associated with substance abuse/mental health issues will improve     Medication Management: Evaluate patient's response, side effects, and tolerance of medication regimen.  Therapeutic Interventions: 1 to 1 sessions, Unit Group sessions and Medication administration.  Evaluation of Outcomes: Progressing   RN Treatment Plan for Primary Diagnosis: Bipolar affective disorder, current episode manic with psychotic symptoms ([place]) Long Term Goal(s): Knowledge of disease and therapeutic regimen to maintain health will improve  Short Term Goals: Ability to participate in decision making will improve, Ability to identify and develop effective coping behaviors will improve and Compliance with  prescribed medications will improve  Medication Management: RN will administer medications as ordered by provider, will assess and evaluate patient's response and provide education to patient for prescribed medication. RN will report any adverse and/or side effects to prescribing provider.  Therapeutic Interventions: 1 on 1 counseling sessions, Psychoeducation, Medication administration, Evaluate responses to treatment, Monitor vital signs and CBGs as ordered, Perform/monitor CIWA, COWS, AIMS and Fall Risk screenings as ordered, Perform wound care treatments as ordered.  Evaluation of Outcomes: Progressing   LCSW Treatment Plan for Primary Diagnosis: Bipolar affective disorder, current episode manic with psychotic symptoms ([place]) Long Term Goal(s): Safe transition to appropriate next level of care at discharge, Engage patient in therapeutic group addressing interpersonal concerns.  Short Term Goals: Engage patient in aftercare planning with referrals and resources, Increase social support, Increase ability to appropriately verbalize feelings, Increase emotional regulation, Facilitate acceptance of mental health diagnosis and concerns and Increase skills for wellness and recovery  Therapeutic Interventions: Assess for all discharge needs, 1 to 1 time with Social worker, Explore available resources and support systems, Assess for adequacy in community support network, Educate family and significant other(s) on suicide prevention, Complete Psychosocial Assessment, Interpersonal group therapy.  Evaluation of Outcomes: Progressing   Progress in Treatment: Attending groups: Yes. Participating in groups: Yes. Taking medication as prescribed: Yes. Toleration medication: Yes. Family/Significant other contact made: No.  Will continue attempts to contact pt family. Patient understands diagnosis: Yes. Discussing patient identified problems/goals with staff: Yes. Medical problems stabilized or resolved:  Yes. Denies suicidal/homicidal ideation: Yes. Issues/concerns per patient self-inventory: No Other: none  New problem(s) identified: No, Describe:  none listed   New Short Term/Long Term Goal(s):  Discharge Plan or Barriers: CSW still assessing for appropriate referrals  Reason for Continuation of Hospitalization: Anxiety Delusions  Depression Mania Suicidal ideation  Estimated Length of Stay: 3-5 days   Attendees: Patient: Bradley Bowers 09/25/2016 10:46 AM  Physician: Dr. Weber Cooks, MD 09/25/2016 10:46 AM  Nursing: Carolynn Sayers, RN 09/25/2016 10:46 AM  RN Care Manager: 09/25/2016 10:46 AM  Social Worker: Glorious Peach, MSW, LCSW 09/25/2016 10:46 AM    Scribe for Treatment Team: Glorious Peach, MSW, LCSW 10/03/2016

## 2016-10-03 NOTE — Progress Notes (Addendum)
Patient with bright affect, cooperative behavior with meals, meds and plan of care. No SI/HI at this time. Fair adls, good appetite. States he is "focusing on discharge to wife and pets". Therapy groups encouraged, safety maintained. Patient transported to court appearance with sheriff. Patient returned with safety maintained. Wife phones for update. Safety maintained. Seems to use humor as coping skill.

## 2016-10-04 NOTE — Progress Notes (Signed)
Patient was up most of the shift, restless but minimizing. Frequently presenting to the nurses station to check the time. Mild confusion noticed. Safety precautions reinforced and therapeutic milieu promoted.

## 2016-10-04 NOTE — Progress Notes (Signed)
Patient has been quite pleasant today. Not as manic as he has been. Is still medication compliant. Did state that he hears Juwuan Brutsche in his head during 5pm medication pass. Went to groups today and engaged as well. Has been participating in activities in the day room as well. Denies SI/HI/VH. Has been on phone with wife today mutiple times. Tried to tell his wife that he was discharging tomorrow, but there are no orders. Remains on q15 minute checks for safety. Will continue monitor.

## 2016-10-04 NOTE — [facility] Group Notes (Signed)
[facility] Group Notes:  (Nursing/MHT/Case Management/Adjunct)  Date:  10/04/2016  Time:  4:03 PM  Type of Therapy:  Psychoeducational Skills  Participation Level:  Active  Participation Quality:  Appropriate  Affect:  Appropriate  Cognitive:  Appropriate  Insight:  Appropriate  Engagement in Group:  Engaged  Modes of Intervention:  Activity, Discussion and Education  Summary of Progress/Problems:  Drake Leach 10/04/2016, 4:03 PM

## 2016-10-04 NOTE — Progress Notes (Signed)
Patient stayed in the milieu, pleasant and cooperative. Thought process mostly organized with episodes of mild confusion and disorganized behavior. Interacting with staff and peers appropriately. Had a snack, received medications then went to bed. Currently resting: no sign of discomfort. Safety precautions maintained.

## 2016-10-04 NOTE — [facility] Group Notes (Signed)
[facility] Group Notes:  (Nursing/MHT/Case Management/Adjunct)  Date:  10/04/2016  Time:  1:02 AM  Type of Therapy:  Group Therapy  Participation Level:  Active  Participation Quality:  Appropriate  Affect:  Appropriate  Cognitive:  Appropriate  Insight:  Appropriate and Improving  Engagement in Group:  Developing/Improving  Modes of Intervention:  n/a  Summary of Progress/Problems:  Bradley Bowers 10/04/2016, 1:02 AM

## 2016-10-04 NOTE — Progress Notes (Signed)
[facility] MD Progress Note  10/04/2016 4:54 PM Bradley Bowers  MRN:  [id number] Subjective:  Patient is a 70 year old married Caucasian male who was brought in by his outpatient psychiatrist to our emergency department on December 18.   In August of this year the patient was charged with child pornography after being found with pictures of minors in his cell phone. Patient has a court hearing for his charges in January. In September he was started on Prozac by his primary care provider and was referred to follow-up with Dr. [name] (outpatient psychiatrist). At some point in time the patient was switched from fluoxetine to sertraline which was increased from 50 mg to 100 mg.  The patient had a follow-up with Dr. [name] yesterday and he was found to be hyperreligious, disorganized, and was voicing suicidal ideation.  Per nursing notes in the ER "Pt states it's all because of the computer and he would like to go back in time and shoot whoever invented the computer. Pt states he was not suicidal until he got here. Pt states he now thinks he should kill himself and take seven or eight others with him."   12/20 patient reports his doing "excellent". He denies problems with mood, appetite, energy, sleep or concentration. Patient says deny he is going to be visited by his wife and the sheriff who is going to get him out of this mess. Patient has pressured speech, psychomotor agitation, requires significant redirection as he does not abstain topic, although his answers are tangential. Per nursing is that he has been eating well. He is slept 6 hours last night and has been compliant with medications.  12/21 patient reports he is doing wonderful. Denies problems with his sleep, appetite, energy or concentration. He says that his concentration has never been better. He grades his energy has a 12 out of 10 x 10 being the best. He denies side effects from his medications or having any physical complaints. He denies  suicidality, homicidality or auditory or visual hallucinations. Patient and her conversation and spoke about time traveling, teaching small children about the assassination of [place]. Helping the sheriff get reelected. Banning  computers in the world and ending mass shootings.  12/22 patient says he is doing excellent. He continues to have pressure speech, psychomotor agitation, tangential thought processes. He still hyper religious. He tells me today he is Jesus Christ because he is walking in his shoes. He tells me I'm going to heaven. He goes on tangents. He denies depressed mood, suicidality, homicidality or auditory or visual hallucinations. Patient denies having any physical complaints or side effects from medications. Patient has been attending groups and is disruptive  12/23 patient denies having any issues or complaints today. He continues to be very tangentiality and now he is talking about Treyton Slimp. It appears to me he was less disorganized than before. He still hyper religious and continues to talk about salvation. He continues to have psychomotor agitation but not as severe as it was for the last 2 days. Per nursing is that he has been compliant with medication and he has been is sleeping well. He denies having problems with mood, appetite, energy is sleep or concentration. Denies suicidality, homicidality or laboratory or visual hallucinations and as physical complaints or having side effects from his medications  12/24 patient slept well last night. He has been compliant with medications. She can be intrusive at times but no aggression or agitation. He continues to be hyperreligious. Today he  tells me he is Bradley Bowers  and also Jesus Christ but later on he says he knows he is not the real [employer]. He continues to talk about salvation and sins. He denies SI, HI or auditory or visual hallucinations. He denies side effects from medications or any physical complaints.  12/25 appears that  euphoria, psychomotor agitation have been gradually decreasing. His speech is not as pressured. Patient does not appear to be as intrusive. He still clearly manic but not as severe as he was prior to admission. This morning he was cheerful and bright. He has been sleeping well. He has been compliant with medications. He has been eating well. He denies any side effects to medications. He denies any physical complaints. He denies problems with energy, sleep appetite or concentration.   Follow-up for the 26th. Patient seen. Chart reviewed. Patient tells me that he has done "a complete 180" because he is feeling great. Apparently he does not remember that the whole reason we admitted him to the hospital as his euphoria. His euphoria seems to of tone down slightly but not completely and he still seems to be thinking in an agitated manner. He is smiling and upbeat. Poorly kempt. Denies suicidal or homicidal ideation. Sleeping better. No medical complaints. Tolerating medicine okay.  Follow-up for the 27th. Patient seen. Patient met with treatment team. His only question was about discharge timing. He was not argumentative about it. On evaluation today the patient continues to be hyperverbal and a little overly euphoric. Doesn't seem to be as grandiose. Doesn't make as many grandiose statements but is still slightly confused and shows some flight of ideas. Tolerating medicine well. Depakote level is 89 which is a very appropriate level. No sign of any new physical distress.  Follow-up on the 28th. Patient seen today and he also attended treatment team. Patient went commitment court today and the involuntary commitment was upheld. He didn't seem can particularly bothered by this although not sure how well he really understood it. Patient is a little bit calmer but still seems inappropriately euphoric and not to quite understand his situation well. His discussion is still fairly inappropriate in terms of being overly  jolly. Nevertheless he is tolerating medication and perhaps getting slightly better.  12/29 Bradley Bowers continues to improve slowly. He denies any symptoms of depression, anxiety, or psychosis but I observed him still and loud and agitated on the unit. He accepts medications and tolerates them well. There are no somatic complaints. He interacts with peers and staff appropriately. Good group participation.  Per nursing: Patient stayed in the milieu, pleasant and cooperative. Thought process mostly organized with episodes of mild confusion and disorganized behavior. Interacting with staff and peers appropriately. Had a snack, received medications then went to bed. Currently resting: no sign of discomfort. Safety precautions maintained.   Principal Problem: Bipolar affective disorder, current episode manic with psychotic symptoms ([place]) Diagnosis:   Patient Active Problem List   Diagnosis Date Noted  . Coronary artery disease [I25.10] 09/24/2016  . Glaucoma [H40.9] 09/24/2016  . Bipolar affective disorder, current episode manic with psychotic symptoms ([place]) [F31.2] 09/23/2016  . Chronic obstructive pulmonary disease ([place]) [J44.9] 11/06/2015  . Gastro-esophageal reflux disease without esophagitis [K21.9] 10/24/2014  . Obstructive apnea [G47.33] 10/24/2014  . Restless leg [G25.81] 10/24/2014   Total Time spent with patient: 30 minutes  Past Psychiatric History: Patient denies any prior history of mental illness prior to summer of this year . Patient denies history of psychiatric  hospitalizations or suicidal attempts   Past Medical History:  Past Medical History:  Diagnosis Date  . Arterial vascular disease 10/24/2014   Overview:  MI 1994, small-vessel disease requiring coumadin   . Asthma   . Centrilobular emphysema ([place]) 10/24/2014   Overview:  2L O2 prn   . Chronic obstructive pulmonary disease ([place]) 11/06/2015  . GERD (gastroesophageal reflux disease)   . Glaucoma   . Heart attack 2/17,  1994  . Heart disease   . Hyperlipemia   . Hypertension   . Restless leg 10/24/2014  . Sleep apnea     Past Surgical History:  Procedure Laterality Date  . CATARACT EXTRACTION W/ INTRAOCULAR LENS  IMPLANT, BILATERAL     Family History:  Family History  Problem Relation Age of Onset  . Nephrolithiasis Father   . Prostate cancer Neg Hx   . Bladder Cancer Neg Hx   . Kidney cancer Neg Hx    Family Psychiatric  History: Patient reports his brother died of liver disease related to alcoholism. He denies any history of mental illness in his family or suicides.   Social History: Patient lives with his wife. He has been married for 29 year. They do not have any children. Patient Retired from work in the [employer]. He has some college education. He does not have any military experience. As far as his legal history his raises serious charges related to child pornography  History  Alcohol Use No     History  Drug Use No    Social History   Social History  . Marital status: Married    Spouse name: N/A  . Number of children: N/A  . Years of education: N/A   Social History Main Topics  . Smoking status: Former Research scientist (life sciences)  . Smokeless tobacco: Never Used  . Alcohol use No  . Drug use: No  . Sexual activity: Not Asked   Other Topics Concern  . None   Social History Narrative  . None    Current Medications: Current Facility-Administered Medications  Medication Dose Route Frequency Provider Last Rate Last Dose  . acetaminophen (TYLENOL) tablet 650 mg  650 mg Oral Q6H PRN Gonzella Lex, MD      . albuterol (PROVENTIL HFA;VENTOLIN HFA) 108 (90 Base) MCG/ACT inhaler 2 puff  2 puff Inhalation Q4H PRN Gonzella Lex, MD      . alum & mag hydroxide-simeth (MAALOX/MYLANTA) 200-200-20 MG/5ML suspension 30 mL  30 mL Oral Q4H PRN Gonzella Lex, MD      . aspirin EC tablet 81 mg  81 mg Oral Daily Gonzella Lex, MD   81 mg at 10/04/16 0829  . atenolol (TENORMIN) tablet 50 mg  50 mg Oral  Daily Gonzella Lex, MD   50 mg at 10/04/16 0829  . atorvastatin (LIPITOR) tablet 80 mg  80 mg Oral q1800 Gonzella Lex, MD   80 mg at 10/02/16 1745  . divalproex (DEPAKOTE ER) 24 hr tablet 1,500 mg  1,500 mg Oral QHS Hildred Priest, MD   1,500 mg at 10/03/16 2151  . feeding supplement (ENSURE ENLIVE) (ENSURE ENLIVE) liquid 237 mL  237 mL Oral BID BM Hildred Priest, MD   237 mL at 10/04/16 1400  . fluticasone (FLONASE) 50 MCG/ACT nasal spray 1 spray  1 spray Each Nare Daily PRN Hildred Priest, MD      . latanoprost (XALATAN) 0.005 % ophthalmic solution 1 drop  1 drop Both Eyes QHS Hildred Priest, MD  1 drop at 10/03/16 2150  . lisinopril (PRINIVIL,ZESTRIL) tablet 5 mg  5 mg Oral Daily Gonzella Lex, MD   5 mg at 10/04/16 0827  . LORazepam (ATIVAN) tablet 1 mg  1 mg Oral Q6H PRN Hildred Priest, MD   1 mg at 09/25/16 0940  . LORazepam (ATIVAN) tablet 2 mg  2 mg Oral QHS Hildred Priest, MD   2 mg at 10/03/16 2151  . magnesium hydroxide (MILK OF MAGNESIA) suspension 30 mL  30 mL Oral Daily PRN Gonzella Lex, MD      . mometasone-formoterol [facility]) 200-5 MCG/ACT inhaler 2 puff  2 puff Inhalation BID Gonzella Lex, MD   2 puff at 10/04/16 [phone]  . OLANZapine (ZYPREXA) tablet 10 mg  10 mg Oral QHS Gonzella Lex, MD   10 mg at 10/03/16 2151  . OLANZapine (ZYPREXA) tablet 5 mg  5 mg Oral BID Hildred Priest, MD   5 mg at 10/04/16 1235  . ondansetron (ZOFRAN-ODT) disintegrating tablet 8 mg  8 mg Oral Q8H PRN Hildred Priest, MD      . pantoprazole (PROTONIX) EC tablet 40 mg  40 mg Oral Daily Gonzella Lex, MD   40 mg at 10/04/16 0830  . ticagrelor (BRILINTA) tablet 90 mg  90 mg Oral BID Gonzella Lex, MD   90 mg at 10/04/16 0830  . timolol (TIMOPTIC) 0.5 % ophthalmic solution 1 drop  1 drop Both Eyes Daily Hildred Priest, MD   1 drop at 10/04/16 0826  . tiotropium (SPIRIVA) inhalation capsule 18 mcg  18  mcg Inhalation Daily Hildred Priest, MD   18 mcg at 10/04/16 4132    Lab Results:  No results found for this or any previous visit (from the past 48 hour(s)).  Blood Alcohol level:  Lab Results  Component Value Date   ETH <5 44/10/270    Metabolic Disorder Labs: Lab Results  Component Value Date   HGBA1C 5.2 09/24/2016   MPG 103 09/24/2016   Lab Results  Component Value Date   PROLACTIN 65.6 (H) 09/24/2016   Lab Results  Component Value Date   CHOL 177 09/24/2016   TRIG 126 09/24/2016   HDL 41 09/24/2016   CHOLHDL 4.3 09/24/2016   VLDL 25 09/24/2016   LDLCALC 111 (H) 09/24/2016    Physical Findings: AIMS:  , ,  ,  , Dental Status Current problems with teeth and/or dentures?: No Does patient usually wear dentures?: No  CIWA:    COWS:     Musculoskeletal: Strength & Muscle Tone: within normal limits Gait & Station: normal Patient leans: N/A  Psychiatric Specialty Exam: Physical Exam  Nursing note and vitals reviewed. Constitutional: He is oriented to person, place, and time. He appears well-developed and well-nourished.  HENT:  Head: Normocephalic and atraumatic.  Eyes: Conjunctivae and EOM are normal. Pupils are equal, round, and reactive to light.  Neck: Normal range of motion.  Cardiovascular: Normal heart sounds.   Respiratory: Effort normal.  GI: Soft.  Musculoskeletal: Normal range of motion.  Neurological: He is alert and oriented to person, place, and time.  Skin: Skin is warm and dry.  Psychiatric: His speech is normal and behavior is normal. His affect is inappropriate. Cognition and memory are normal. He expresses no suicidal ideation.  s on okay, sooneso so he did not have any reason why 1 and your husband will be her husband was okay sorry patient to see r   Review of Systems  Constitutional:  Negative.   HENT: Negative.   Eyes: Negative.   Respiratory: Negative.   Cardiovascular: Negative.   Gastrointestinal: Negative.    Genitourinary: Negative.   Musculoskeletal: Negative.   Skin: Negative.   Neurological: Negative.   Endo/Heme/Allergies: Negative.   Psychiatric/Behavioral: Negative.     Blood pressure 111/81, pulse 64, temperature 98.4 F (36.9 C), temperature source Oral, resp. rate 18, height 5' 6"  (1.676 m), weight 75.8 kg (167 lb), SpO2 95 %.Body mass index is 26.95 kg/m.  General Appearance: Disheveled  Eye Contact:  Good  Speech:  Pressured  Volume:  Normal  Mood:  Euphoric  Affect:  Labile  Thought Process:  Irrelevant and Descriptions of Associations: Tangential  Orientation:  Full (Time, Place, and Person)  Thought Content:  Hallucinations: None  Suicidal Thoughts:  No  Homicidal Thoughts:  No  Memory:  Immediate;   Poor Recent;   Poor Remote;   Poor  Judgement:  Impaired  Insight:  Lacking  Psychomotor Activity:  Increased  Concentration:  Concentration: Poor and Attention Span: Poor  Recall:  Poor  Fund of Knowledge:  Good  Language:  Good  Akathisia:  No  Handed:    AIMS (if indicated):     Assets:  Agricultural consultant Housing Social Support  ADL's:  Intact  Cognition:  Impaired,  Moderate  Sleep:  Number of Hours: 4.15     Treatment Plan Summary:  Mild improvement, Has been is sleeping well for the last 4  nights and appears to have less psychomotor agitation. No changes in medications today. We will need to check a Depakote level on Wednesday night before his evening dose  Patient is a 71 year old married Caucasian who is presenting with mania.   For bipolar disorder the patient has been started on olanzapine. On December 20 the dose was increased from 51m/d to 20 mg q/day (currently he is on 5 mg of Zyprexa in the morning, 5 mg at lunch and 10 mg in the evening)--no changes today appears to be improving  Depakote ER on 12/22, continue 1500 mg at bedtime. Will need a level on 12/27 bedtime  For insomnia: Patient slept 6.5 h last  night. Continue Ativan 2 mg by mouth daily at bedtime  For hypertension: continued on lisinopril and atenolol. VS have been stable.  Today his BP was a little low.  Meds for BP were held.  Will recheck BP at 12pm and offer meds then if BP betetr  For cardiovascular disease continue Brilinta  For COPD: continued on albuterol, dulera and spiriva.  For dyslipidemia:  continued on Lipitor  For GERD: continued on Protonix  For glaucoma continue eyedrops  For restless leg syndrome continue Neurontin 300 mg daily at bedtime--- will d/c for now as pt on depakote  Vitamin B12 deficiency. His vitamin B12 is 189. Continue vitamin B12 for a total of 7 days, then weekly  Labs: Ammonia was within the normal limits, cholesterol was within the normal limits, hemoglobin A1c was 5.2, TSH was within the normal limits, HIV was nonreactive, RPR was nonreactive, UA was clear. Urine toxicology was negative.   EKG shows sinus bradycardia with a QTC of 434.  Brain MRI: Mild cerebrovascular disease without any other abnormalities.  Follow-up plan as of the 26th. No change in medication. Continue on current Depakote dose. Supportive counseling and encouragement to continue to attend groups. Patient's family is still visiting regularly. He appears to still be manic and slightly confused but somewhat improved from before presentation.  Not quite as hyper religious or agitated. Still disheveled with odd presentation. No plan yet for discharge.  No change to medicine today. Gradual improvement noted. Vital signs stable. Collateral information: I have reviewed records and notes from his primary care was started treating him for depression back in September. I contacted the patient's wife had [phone] her name is Bradley Bowers. She reports patient started having manic symptoms on Saturday. He was talking nonstop. Wanting to deliver Christmas present to 7 AM to somebody's house on Sunday morning. He was becoming  more agitated and confrontational with his wife on Sunday. She denies any prior history of symptoms like this. There is also no family history of mental illness that she is aware of.    Spoke with Dr [name] on 12/20 and 12/21.  Spoke with wife again on 12/21.  Orson Slick, MD 10/04/2016, 4:54 PM

## 2016-10-04 NOTE — Plan of Care (Signed)
Problem: Role Relationship: Goal: Ability to communicate needs accurately will improve Outcome: Progressing Presented to the nurses station as needed to express his concerns

## 2016-10-04 NOTE — Plan of Care (Signed)
Problem: Coping: Goal: Ability to identify and develop effective coping behavior will improve Outcome: Progressing Talking to staff and peers, using sense of humor

## 2016-10-04 NOTE — Plan of Care (Signed)
Problem: Safety: Goal: Ability to remain free from injury will improve Outcome: Progressing Denies SI/HI. Safety precautions maintained

## 2016-10-04 NOTE — Plan of Care (Signed)
Problem: Pain Managment: Goal: General experience of comfort will improve Outcome: Progressing Patient is able to disclose if he is feeling any discomfort or pain

## 2016-10-04 NOTE — [facility] Group Notes (Signed)
[place] LCSW Group Therapy  10/04/2016 2:41 PM  Type of Therapy:  Group Therapy  Participation Level:  Active  Participation Quality:  Attentive  Affect:  Appropriate  Cognitive:  Appropriate  Insight:  Engaged  Engagement in Therapy:  Developing/Improving  Modes of Intervention:  Activity, Clarification, Discussion, Education, Exploration and Socialization  Summary of Progress/Problems: Summary of Progress/Problems: The topic for today was feelings about relapse. Pt discussed what relapse prevention is to them and identified triggers that they are on the path to relapse. Pt processed their feeling towards relapse and was able to relate to peers. Pt discussed coping skills that can be used for relapse prevention.He has a great sense of humour and showed empathy and support towards his peers.   Enis Slipper M 10/04/2016, 2:41 PM

## 2016-10-05 MED ORDER — CYANOCOBALAMIN 1000 MCG/ML IJ SOLN
1000.0000 ug | Freq: Once | INTRAMUSCULAR | Status: AC
  Administered 2016-10-05: 1000 ug via INTRAMUSCULAR
  Filled 2016-10-05: qty 1

## 2016-10-05 MED ORDER — VITAMIN B-12 1000 MCG PO TABS
1000.0000 ug | ORAL_TABLET | Freq: Every day | ORAL | Status: DC
  Administered 2016-10-05 – 2016-10-06 (×2): 1000 ug via ORAL
  Filled 2016-10-05 (×2): qty 1

## 2016-10-05 NOTE — Progress Notes (Signed)
Pt denies SI/HI/AVH. Attended evening group. Visible in dayroom with appropriate behaviors. Speech rapid. Stated "I had a busy day, I need to rest". Cooperative with medications and plan of care. Pleasant during interaction. Safety maintained. Will continue to monitor.

## 2016-10-05 NOTE — Plan of Care (Signed)
Problem: Coping: Goal: Ability to identify and develop effective coping behavior will improve Outcome: Not Progressing Patient unable to utilize coping mechanisms as he is continuing to have flight of ideas and tangential in thought process at this time [employer]

## 2016-10-05 NOTE — Progress Notes (Signed)
[facility] MD Progress Note  10/05/2016 11:05 AM Bradley Bowers  MRN:  [id number] Subjective:  Patient is a 70 year old married Caucasian male who was brought in by his outpatient psychiatrist to our emergency department on December 18.   In August of this year the patient was charged with child pornography after being found with pictures of minors in his cell phone. Patient has a court hearing for his charges in January. In September he was started on Prozac by his primary care provider and was referred to follow-up with Dr. [name] (outpatient psychiatrist). At some point in time the patient was switched from fluoxetine to sertraline which was increased from 50 mg to 100 mg.  The patient had a follow-up with Dr. [name] yesterday and he was found to be hyperreligious, disorganized, and was voicing suicidal ideation.  Per nursing notes in the ER "Pt states it's all because of the computer and he would like to go back in time and shoot whoever invented the computer. Pt states he was not suicidal until he got here. Pt states he now thinks he should kill himself and take seven or eight others with him."   12/20 patient reports his doing "excellent". He denies problems with mood, appetite, energy, sleep or concentration. Patient says deny he is going to be visited by his wife and the sheriff who is going to get him out of this mess. Patient has pressured speech, psychomotor agitation, requires significant redirection as he does not abstain topic, although his answers are tangential. Per nursing is that he has been eating well. He is slept 6 hours last night and has been compliant with medications.  12/21 patient reports he is doing wonderful. Denies problems with his sleep, appetite, energy or concentration. He says that his concentration has never been better. He grades his energy has a 12 out of 10 x 10 being the best. He denies side effects from his medications or having any physical complaints. He denies  suicidality, homicidality or auditory or visual hallucinations. Patient and her conversation and spoke about time traveling, teaching small children about the assassination of [place]. Helping the sheriff get reelected. Banning  computers in the world and ending mass shootings.  12/22 patient says he is doing excellent. He continues to have pressure speech, psychomotor agitation, tangential thought processes. He still hyper religious. He tells me today he is Jesus Christ because he is walking in his shoes. He tells me I'm going to heaven. He goes on tangents. He denies depressed mood, suicidality, homicidality or auditory or visual hallucinations. Patient denies having any physical complaints or side effects from medications. Patient has been attending groups and is disruptive  12/23 patient denies having any issues or complaints today. He continues to be very tangentiality and now he is talking about Matther Labell. It appears to me he was less disorganized than before. He still hyper religious and continues to talk about salvation. He continues to have psychomotor agitation but not as severe as it was for the last 2 days. Per nursing is that he has been compliant with medication and he has been is sleeping well. He denies having problems with mood, appetite, energy is sleep or concentration. Denies suicidality, homicidality or laboratory or visual hallucinations and as physical complaints or having side effects from his medications  12/24 patient slept well last night. He has been compliant with medications. She can be intrusive at times but no aggression or agitation. He continues to be hyperreligious. Today he  tells me he is [place]  and also Jesus Christ but later on he says he knows he is not the real [employer]. He continues to talk about salvation and sins. He denies SI, HI or auditory or visual hallucinations. He denies side effects from medications or any physical complaints.  12/25 appears that  euphoria, psychomotor agitation have been gradually decreasing. His speech is not as pressured. Patient does not appear to be as intrusive. He still clearly manic but not as severe as he was prior to admission. This morning he was cheerful and bright. He has been sleeping well. He has been compliant with medications. He has been eating well. He denies any side effects to medications. He denies any physical complaints. He denies problems with energy, sleep appetite or concentration.   Follow-up for the 26th. Patient seen. Chart reviewed. Patient tells me that he has done "a complete 180" because he is feeling great. Apparently he does not remember that the whole reason we admitted him to the hospital as his euphoria. His euphoria seems to of tone down slightly but not completely and he still seems to be thinking in an agitated manner. He is smiling and upbeat. Poorly kempt. Denies suicidal or homicidal ideation. Sleeping better. No medical complaints. Tolerating medicine okay.  Follow-up for the 27th. Patient seen. Patient met with treatment team. His only question was about discharge timing. He was not argumentative about it. On evaluation today the patient continues to be hyperverbal and a little overly euphoric. Doesn't seem to be as grandiose. Doesn't make as many grandiose statements but is still slightly confused and shows some flight of ideas. Tolerating medicine well. Depakote level is 89 which is a very appropriate level. No sign of any new physical distress.  Follow-up on the 28th. Patient seen today and he also attended treatment team. Patient went commitment court today and the involuntary commitment was upheld. He didn't seem can particularly bothered by this although not sure how well he really understood it. Patient is a little bit calmer but still seems inappropriately euphoric and not to quite understand his situation well. His discussion is still fairly inappropriate in terms of being overly  jolly. Nevertheless he is tolerating medication and perhaps getting slightly better.  12/29 Mr. Gunnels continues to improve slowly. He denies any symptoms of depression, anxiety, or psychosis but I observed him still and loud and agitated on the unit. He accepts medications and tolerates them well. There are no somatic complaints. He interacts with peers and staff appropriately. Good group participation.  12/30: The patient has had significant evidence of admission. He is much calmer today and thought processes are more organized. He is not so preoccupied with suicide except he started talking today extensively about Deron Poole shooting himself in the head being at peace with God as "the way to go". He then however denied suicidal thoughts. 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 He denies any auditory or visual hallucinations. He denies any current active suicidal thoughts. He has had some pressured speech yesterday per nursing as well as some mild confusion and disorganized behavior yesterday but none this morning. He denies any problems with insomnia and is now sleeping well at night. Appetite has increased and he is eating a lot.     Principal Problem: Bipolar affective disorder, current episode manic with psychotic symptoms ([place]) Diagnosis:   Patient Active Problem List   Diagnosis Date Noted  . Coronary artery disease [  I25.10] 09/24/2016  . Glaucoma [H40.9] 09/24/2016  . Bipolar affective disorder, current episode manic with psychotic symptoms ([place]) [F31.2] 09/23/2016  . Chronic obstructive pulmonary disease ([place]) [J44.9] 11/06/2015  . Gastro-esophageal reflux disease without esophagitis [K21.9] 10/24/2014  . Obstructive apnea [G47.33] 10/24/2014  . Restless leg [G25.81] 10/24/2014   Total Time spent with patient: 30 minutes  Past Psychiatric History: Patient denies any prior history of mental illness prior to summer of this year . Patient denies history of  psychiatric hospitalizations or suicidal attempts   Past Medical History:  Past Medical History:  Diagnosis Date  . Arterial vascular disease 10/24/2014   Overview:  MI 1994, small-vessel disease requiring coumadin   . Asthma   . Centrilobular emphysema ([place]) 10/24/2014   Overview:  2L O2 prn   . Chronic obstructive pulmonary disease ([place]) 11/06/2015  . GERD (gastroesophageal reflux disease)   . Glaucoma   . Heart attack 2/17, 1994  . Heart disease   . Hyperlipemia   . Hypertension   . Restless leg 10/24/2014  . Sleep apnea     Past Surgical History:  Procedure Laterality Date  . CATARACT EXTRACTION W/ INTRAOCULAR LENS  IMPLANT, BILATERAL     Family History:  Family History  Problem Relation Age of Onset  . Nephrolithiasis Father   . Prostate cancer Neg Hx   . Bladder Cancer Neg Hx   . Kidney cancer Neg Hx    Family Psychiatric  History: Patient reports his brother died of liver disease related to alcoholism. He denies any history of mental illness in his family or suicides.   Social History: Patient lives with his wife. He has been married for 29 year. They do not have any children. Patient Retired from work in the [employer]. He has some college education. He does not have any military experience. As far as his legal history his raises serious charges related to child pornography  History  Alcohol Use No     History  Drug Use No    Social History   Social History  . Marital status: Married    Spouse name: N/A  . Number of children: N/A  . Years of education: N/A   Social History Main Topics  . Smoking status: Former Research scientist (life sciences)  . Smokeless tobacco: Never Used  . Alcohol use No  . Drug use: No  . Sexual activity: Not Asked   Other Topics Concern  . None   Social History Narrative  . None    Current Medications: Current Facility-Administered Medications  Medication Dose Route Frequency Provider Last Rate Last Dose  . acetaminophen (TYLENOL) tablet 650 mg   650 mg Oral Q6H PRN Gonzella Lex, MD      . albuterol (PROVENTIL HFA;VENTOLIN HFA) 108 (90 Base) MCG/ACT inhaler 2 puff  2 puff Inhalation Q4H PRN Gonzella Lex, MD      . alum & mag hydroxide-simeth (MAALOX/MYLANTA) 200-200-20 MG/5ML suspension 30 mL  30 mL Oral Q4H PRN Gonzella Lex, MD      . aspirin EC tablet 81 mg  81 mg Oral Daily Gonzella Lex, MD   81 mg at 10/05/16 0859  . atenolol (TENORMIN) tablet 50 mg  50 mg Oral Daily Gonzella Lex, MD   50 mg at 10/05/16 0859  . atorvastatin (LIPITOR) tablet 80 mg  80 mg Oral q1800 Gonzella Lex, MD   80 mg at 10/04/16 1707  . cyanocobalamin ((VITAMIN B-12)) injection 1,000 mcg  1,000 mcg  Intramuscular Once Chauncey Mann, MD      . divalproex (DEPAKOTE ER) 24 hr tablet 1,500 mg  1,500 mg Oral QHS Hildred Priest, MD   1,500 mg at 10/04/16 2140  . feeding supplement (ENSURE ENLIVE) (ENSURE ENLIVE) liquid 237 mL  237 mL Oral BID BM Hildred Priest, MD   237 mL at 10/04/16 1400  . fluticasone (FLONASE) 50 MCG/ACT nasal spray 1 spray  1 spray Each Nare Daily PRN Hildred Priest, MD      . latanoprost (XALATAN) 0.005 % ophthalmic solution 1 drop  1 drop Both Eyes QHS Hildred Priest, MD   1 drop at 10/04/16 2200  . lisinopril (PRINIVIL,ZESTRIL) tablet 5 mg  5 mg Oral Daily Gonzella Lex, MD   5 mg at 10/05/16 0858  . LORazepam (ATIVAN) tablet 1 mg  1 mg Oral Q6H PRN Hildred Priest, MD   1 mg at 09/25/16 0940  . LORazepam (ATIVAN) tablet 2 mg  2 mg Oral QHS Hildred Priest, MD   2 mg at 10/04/16 2140  . magnesium hydroxide (MILK OF MAGNESIA) suspension 30 mL  30 mL Oral Daily PRN Gonzella Lex, MD      . mometasone-formoterol [facility]) 200-5 MCG/ACT inhaler 2 puff  2 puff Inhalation BID Gonzella Lex, MD   2 puff at 10/05/16 0859  . OLANZapine (ZYPREXA) tablet 10 mg  10 mg Oral QHS Gonzella Lex, MD   10 mg at 10/04/16 2140  . OLANZapine (ZYPREXA) tablet 5 mg  5 mg Oral BID Hildred Priest, MD   5 mg at 10/05/16 0859  . ondansetron (ZOFRAN-ODT) disintegrating tablet 8 mg  8 mg Oral Q8H PRN Hildred Priest, MD      . pantoprazole (PROTONIX) EC tablet 40 mg  40 mg Oral Daily Gonzella Lex, MD   40 mg at 10/05/16 0859  . ticagrelor (BRILINTA) tablet 90 mg  90 mg Oral BID Gonzella Lex, MD   90 mg at 10/05/16 0859  . timolol (TIMOPTIC) 0.5 % ophthalmic solution 1 drop  1 drop Both Eyes Daily Hildred Priest, MD   1 drop at 10/05/16 0859  . tiotropium (SPIRIVA) inhalation capsule 18 mcg  18 mcg Inhalation Daily Hildred Priest, MD   18 mcg at 10/05/16 0858  . vitamin B-12 (CYANOCOBALAMIN) tablet 1,000 mcg  1,000 mcg Oral Daily Chauncey Mann, MD        Lab Results:  No results found for this or any previous visit (from the past 48 hour(s)).  Blood Alcohol level:  Lab Results  Component Value Date   ETH <5 76/28/3151    Metabolic Disorder Labs: Lab Results  Component Value Date   HGBA1C 5.2 09/24/2016   MPG 103 09/24/2016   Lab Results  Component Value Date   PROLACTIN 65.6 (H) 09/24/2016   Lab Results  Component Value Date   CHOL 177 09/24/2016   TRIG 126 09/24/2016   HDL 41 09/24/2016   CHOLHDL 4.3 09/24/2016   VLDL 25 09/24/2016   LDLCALC 111 (H) 09/24/2016    Physical Findings: AIMS:  , ,  ,  , Dental Status Current problems with teeth and/or dentures?: No Does patient usually wear dentures?: No  CIWA:    COWS:     Musculoskeletal: Strength & Muscle Tone: within normal limits Gait & Station: normal Patient leans: N/A  Psychiatric Specialty Exam: Physical Exam  Nursing note and vitals reviewed. Constitutional: He is oriented to person, place, and time. He appears  well-developed and well-nourished.  HENT:  Head: Normocephalic and atraumatic.  Eyes: Conjunctivae and EOM are normal. Pupils are equal, round, and reactive to light.  Neck: Normal range of motion.  Cardiovascular: Normal heart sounds.    Respiratory: Effort normal.  GI: Soft.  Musculoskeletal: Normal range of motion.  Neurological: He is alert and oriented to person, place, and time.  Skin: Skin is warm and dry.  Psychiatric: His speech is normal and behavior is normal. His affect is inappropriate. Cognition and memory are normal. He expresses no suicidal ideation.  s on okay, sooneso so he did not have any reason why 1 and your husband will be her husband was okay sorry patient to see r   Review of Systems  Constitutional: Negative.   HENT: Negative.   Eyes: Negative.   Respiratory: Negative.   Cardiovascular: Negative.   Gastrointestinal: Negative.   Genitourinary: Negative.   Musculoskeletal: Negative.   Skin: Negative.   Neurological: Negative.   Endo/Heme/Allergies: Negative.   Psychiatric/Behavioral: Negative.     Blood pressure 122/64, pulse 69, temperature 97.8 F (36.6 C), temperature source Oral, resp. rate 20, height 5' 6"  (1.676 m), weight 75.8 kg (167 lb), SpO2 95 %.Body mass index is 26.95 kg/m.  General Appearance: Disheveled  Eye Contact:  Good  Speech:  Pressured  Volume:  Normal  Mood:  "I feel fantastic"  Affect:  Calmer overall and not as labile  Thought Process:  Irrelevant and Descriptions of Associations: Tangential  Orientation:  Full (Time, Place, and Person)  Thought Content:  Hallucinations: None  Suicidal Thoughts:  No  Homicidal Thoughts:  No  Memory:  Immediate;   Fair Recent;   Fair Remote;   Fair  Judgement:  Other:  Improved  Insight:  Lacking  Psychomotor Activity:  Increased  Concentration:  Concentration: Fair and Attention Span: Fair  Recall:  Poor  Fund of Knowledge:  Good  Language:  Good  Akathisia:  No  Handed:    AIMS (if indicated):     Assets:  Agricultural consultant Housing Social Support  ADL's:  Intact  Cognition:  Impaired,  Moderate  Sleep:  Number of Hours: 6.3     Treatment Plan Summary:    Patient is a  70 year old married Caucasian who was admitted with mania  For bipolar disorder the patient has been started on olanzapine. On December 20 the dose was increased from 11m/d to 20 mg q/day (currently he is on 5 mg of Zyprexa in the morning, 5 mg at lunch and 10 mg in the evening)--no changes today appears to be improving. He currently on Depakote ER 1500 mg by mouth bedtime. VPA level was 89 and will recheck VPA in the morning. The patient was being given Ativan 2 mg by mouth bedtime but will discontinue since he is also getting Depakote and Zyprexa at bedtime. Due to sleep apnea, he should not have benzodiazepines at bedtime. He is also refusing to wear a CPAP. Hemoglobin A1c was 5.2. Prolactin level was elevated 65.6 he did before antipsychotic medication. Will refer to endocrinology's in outpatient. MRI of the brain showed small vessel ischemic disease but no mass or acute process.  For hypertension: continued on lisinopril and atenolol. VS have been stable.    For cardiovascular disease continue Brilinta  For COPD: continued on albuterol, dulera and spiriva.  For dyslipidemia:  continued on Lipitor  For GERD: continued on Protonix  For glaucoma continue eyedrops  For restless leg syndrome continue Neurontin 300  mg daily at bedtime--- will d/c for now as pt on depakote  Vitamin B12 deficiency. His vitamin B12 is 189. Continue vitamin B12 for a total of 7 days, then weekly. The patient will be given vitamin B12 1000 mcg po daily  Labs: Ammonia was within the normal limits, cholesterol was within the normal limits, hemoglobin A1c was 5.2, TSH was within the normal limits, HIV was nonreactive, RPR was nonreactive, UA was clear. Urine toxicology was negative.   EKG shows sinus bradycardia with a QTC of 434.  Brain MRI: Mild cerebrovascular disease without any other abnormalities.  Spoke with Dr [name] on 12/20 and 12/21.  Spoke with wife again on 12/21.  I certify that the services  received since the previous certification/recertification were and continue to be medically necessary as the treatment provided can be reasonably expected to improve the patient's condition; the medical record documents that the services furnished were intensive treatment services or their equivalent services, and this patient continues to need, on a daily basis, active treatment furnished directly by or requiring the supervision of inpatient psychiatric personnel.  Jay Schlichter, MD 10/05/2016, 11:05 AM

## 2016-10-05 NOTE — Plan of Care (Signed)
Problem: Activity: Goal: Will verbalize the importance of balancing activity with adequate rest periods Outcome: Progressing Pt stated he had a busy day and needed to rest.

## 2016-10-05 NOTE — Progress Notes (Signed)
D: Patient is alert  Oriented to self on the unit this shift. Patient attended  in groups today. Patient denies suicidal ideation, homicidal ideation, auditory or visual hallucinations at the present time.  A: Scheduled medications are administered to patient as per MD orders. Emotional support and encouragement are provided. Patient is maintained on q.15 minute safety checks. Patient is informed to notify staff with questions or concerns. R: No adverse medication reactions are noted. Patient is cooperative with medication administration and treatment plan today. Patient is receptive, flight of ideas and intrusive and cooperative on the unit at this time. Patient interacts with others on the unit this shift. Patient contracts for safety at this time. Patient remains safe at this time. Depression 6/10 Anxiety 2/10

## 2016-10-05 NOTE — [facility] Group Notes (Signed)
[facility] Group Notes:  (Nursing/MHT/Case Management/Adjunct)  Date:  10/05/2016  Time:  4:28 AM  Type of Therapy:  Psychoeducational Skills  Participation Level:  Active  Participation Quality:  Appropriate, Attentive and Sharing  Affect:  Appropriate  Cognitive:  Appropriate  Insight:  Appropriate  Engagement in Group:  Engaged  Modes of Intervention:  Discussion, Socialization and Support  Summary of Progress/Problems:  Reece Agar 10/05/2016, 4:28 AM

## 2016-10-06 ENCOUNTER — Encounter: Payer: Self-pay | Admitting: Psychiatry

## 2016-10-06 LAB — VALPROIC ACID LEVEL: VALPROIC ACID LVL: 82 ug/mL (ref 50.0–100.0)

## 2016-10-06 MED ORDER — CYANOCOBALAMIN 1000 MCG PO TABS: 1000.0000 ug | ORAL_TABLET | Freq: Every day | ORAL | 1 refills | Status: DC

## 2016-10-06 MED ORDER — DIVALPROEX SODIUM ER 500 MG PO TB24: 1500.0000 mg | ORAL_TABLET | Freq: Every day | ORAL | 1 refills | Status: DC

## 2016-10-06 MED ORDER — OLANZAPINE 10 MG PO TABS: 10.0000 mg | ORAL_TABLET | Freq: Every day | ORAL | 1 refills | Status: DC

## 2016-10-06 NOTE — [facility] Suicide Risk Assessment (Signed)
[facility] Discharge Suicide Risk Assessment   Principal Problem: Bipolar affective disorder, current episode manic with psychotic symptoms [facility]) Discharge Diagnoses:  Patient Active Problem List   Diagnosis Date Noted  . Coronary artery disease [I25.10] 09/24/2016  . Glaucoma [H40.9] 09/24/2016  . Bipolar affective disorder, current episode manic with psychotic symptoms ([place]) [F31.2] 09/23/2016  . Chronic obstructive pulmonary disease ([place]) [J44.9] 11/06/2015  . Gastro-esophageal reflux disease without esophagitis [K21.9] 10/24/2014  . Obstructive apnea [G47.33] 10/24/2014  . Restless leg [G25.81] 10/24/2014    Total Time spent with patient: 30 minutes  Musculoskeletal: Strength & Muscle Tone: within normal limits Gait & Station: normal Patient leans: N/A  Psychiatric Specialty Exam: Review of Systems  All other systems reviewed and are negative.   Blood pressure (!) 121/57, pulse 65, temperature 97.8 F (36.6 C), temperature source Oral, resp. rate 20, height 5\' 6"  (1.676 m), weight 75.8 kg (167 lb), SpO2 95 %.Body mass index is 26.95 kg/m.  General Appearance: Casual  Eye Contact::  Good  Speech:  Clear and Coherent409  Volume:  Normal  Mood:  Euthymic  Affect:  Appropriate  Thought Process:  Goal Directed and Descriptions of Associations: Intact  Orientation:  Full (Time, Place, and Person)  Thought Content:  WDL  Suicidal Thoughts:  No  Homicidal Thoughts:  No  Memory:  Immediate;   Fair Recent;   Fair Remote;   Fair  Judgement:  Impaired  Insight:  Present  Psychomotor Activity:  Normal  Concentration:  Fair  Recall:  [employer] [place]  Language: Fair  Akathisia:  No  Handed:  Right  AIMS (if indicated):     Assets:  Communication Skills Desire for Improvement Financial Resources/Insurance Housing Intimacy Physical Health Resilience Social Support Transportation  Sleep:  Number of Hours: 5.45  Cognition: WNL  ADL's:  Intact   Mental Status  Per Nursing Assessment::   On Admission:  NA  Demographic Factors:  Male, Age 22 or older and Caucasian  Loss Factors: Legal issues  Historical Factors: Impulsivity  Risk Reduction Factors:   Sense of responsibility to family, Religious beliefs about death, Living with another person, especially a relative, Positive social support and Positive therapeutic relationship  Continued Clinical Symptoms:  Bipolar Disorder:   Mixed State  Cognitive Features That Contribute To Risk:  None    Suicide Risk:  Minimal: No identifiable suicidal ideation.  Patients presenting with no risk factors but with morbid ruminations; may be classified as minimal risk based on the severity of the depressive symptoms  Follow-up Information    Dr. Ceph[facility]. Call on 10/08/2016.   Why:  Please call to reschedule your appointment with Dr. [name]. You missed an appointment on Wednesday December 27th, 2017 for your hospital follow up for medication management and therapy with Dr. [name].  Contact information: Dr. Cephus [facility] [address] [place], [place] [address] Fax: [phone] Ph: [phone]          Plan Of Care/Follow-up recommendations:  Activity:  As tolerated. Diet:  Low sodium heart healthy. Other:  Keep follow-up appointments.  Orson Slick, MD 10/06/2016, 11:06 AM

## 2016-10-06 NOTE — Progress Notes (Addendum)
Patient has no complaints this morning besides wanting to go home. Denies SI/HI/AVH. Has two bandages on left and right forearm, no excessive bleeding or complaints about it from patients. Remains on q15 minute checks for safety. Will continue to monitor.

## 2016-10-06 NOTE — Progress Notes (Signed)
Provided and reviewed discharge paperwork and prescriptions. Verified understanding by use of teach back method. Pt is knowledgeable regarding medications and his medication regimen. Pt belongings returned as noted when discharged. Denies SI/HI/AVH. Pt very calm, cooperative and anticipates going home with his wife "to watch the [place] at home." Safety maintained with every 15 minute checks. Pt's wife to pick up at 1400 and transport home. Will continue to monitor.

## 2016-10-06 NOTE — Discharge Summary (Signed)
Physician Discharge Summary Note  Patient:  Bradley Bowers is an 70 y.o., male MRN:  [id number] DOB:  1945-10-24 Patient phone:  [phone] (home)  Patient address:   [address] [place] [address],  Total Time spent with patient: 30 minutes  Date of Admission:  09/23/2016 Date of Discharge: 10/06/2016  Reason for Admission:  Psychotic break.  History of Present Illness:   Patient is a 70 year old married Caucasian male who was brought in by his outpatient psychiatrist to our emergency department on December 18.   In August of this year the patient was charged with child pornography after being found with pictures of minors in his cell phone. Patient has a court hearing for his charges in January. In September he was started on Prozac by his primary care provider and was referred to follow-up with Dr. Nicolasa Bowers (outpatient psychiatrist). At some point in time the patient was switched from fluoxetine to sertraline which was increased from 50 mg to 100 mg.  The patient had a follow-up with Dr. Nicolasa Bowers yesterday and he was found to be hyperreligious, disorganized, and was voicing suicidal ideation.  Per nursing notes in the ER "Pt states it's all because of the computer and he would like to go back in time and shoot whoever invented the computer. Pt states he was not suicidal until he got here. Pt states he now thinks he should kill himself and take seven or eight others with him."  During examination today the patient kept insisting on asking me if I was safe. Patient is a limited historian. He is very tangential. He displays evidence of psychomotor agitation, poor attention and poor concentration pressure speech. There is some grandiosity. Patient talks about his IQ score a his SAT scores.Patient reports talking to God in prayer and hearing the voice of God being his head. He also hears the voice of the devil that tells him to get on on Facebook. Patient denies having problems with his  mood, appetite, energy or sleep. He denies suicidality or homicidality today.  He denies having any traumatic experiences.  Substance abuse patient denies the use of any alcohol or illicit substances. Patient says he was "a drunk" for 22 years. He last used alcohol in 1989  Associated Signs/Symptoms: Depression Symptoms:  suicidal thoughts with specific plan, disturbed sleep, (Hypo) Manic Symptoms:  Distractibility, Elevated Mood, Flight of Ideas, Grandiosity, Impulsivity, Labiality of Mood, Anxiety Symptoms:  Excessive Worry, Psychotic Symptoms:  Hallucinations: Auditory PTSD Symptoms: Negative  Past Psychiatric History: Patient denies any prior history of mental illness prior to summer of this year . Patient denies history of psychiatric hospitalizations or suicidal attempts   Family Psychiatric  History: Patient reports his brother died of liver disease related to alcoholism. He denies any history of mental illness in his family or suicides.   Tobacco Screening: Have you used any form of tobacco in the last 30 days? (Cigarettes, Smokeless Tobacco, Cigars, and/or Pipes): No   Social History: Patient lives with his wife. He has been married for 29 year. They do not have any children. Patient Retired from work in the [employer]. He has some college education. He does not have any military experience. As far as his legal history his raises serious charges related to child pornography     History   Principal Problem: Bipolar affective disorder, current episode manic with psychotic symptoms [facility]) Discharge Diagnoses: Patient Active Problem List   Diagnosis Date Noted  . Coronary artery disease [  I25.10] 09/24/2016  . Glaucoma [H40.9] 09/24/2016  . Bipolar affective disorder, current episode manic with psychotic symptoms ([place]) [F31.2] 09/23/2016  . Chronic obstructive pulmonary disease ([place]) [J44.9] 11/06/2015  . Gastro-esophageal reflux disease without esophagitis [K21.9]  10/24/2014  . Obstructive apnea [G47.33] 10/24/2014  . Restless leg [G25.81] 10/24/2014   Past Medical History:  Past Medical History:  Diagnosis Date  . Arterial vascular disease 10/24/2014   Overview:  MI 1994, small-vessel disease requiring coumadin   . Asthma   . Centrilobular emphysema ([place]) 10/24/2014   Overview:  2L O2 prn   . Chronic obstructive pulmonary disease ([place]) 11/06/2015  . GERD (gastroesophageal reflux disease)   . Glaucoma   . Heart attack 2/17, 1994  . Heart disease   . Hyperlipemia   . Hypertension   . Restless leg 10/24/2014  . Sleep apnea     Past Surgical History:  Procedure Laterality Date  . CATARACT EXTRACTION W/ INTRAOCULAR LENS  IMPLANT, BILATERAL     Family History:  Family History  Problem Relation Age of Onset  . Nephrolithiasis Father   . Prostate cancer Neg Hx   . Bladder Cancer Neg Hx   . Kidney cancer Neg Hx     Social History:  History  Alcohol Use No     History  Drug Use No    Social History   Social History  . Marital status: Married    Spouse name: N/A  . Number of children: N/A  . Years of education: N/A   Social History Main Topics  . Smoking status: Former Research scientist (life sciences)  . Smokeless tobacco: Never Used  . Alcohol use No  . Drug use: No  . Sexual activity: Not Asked   Other Topics Concern  . None   Social History Narrative  . None    Hospital Course:    [name] is a 70 year old male with no past psychiatric history admitted in a manic, psychotic episode.   1. Mood and psychosis. We was treated with Zyprexa for psychosis and Depakote for mood stabilization. VPA level 89.  2. HTN. We continued on lisinopril and atenolol. VS have been stable.    3. CAD. We continued Brilinta  4. COPD. We continued albuterol, dulera and spiriva.  5. Dyslipidemia. He is on Lipitor  6. GERD. We continued Protonix  7. Glaucoma. We continued eyedrops.  8. Restless leg syndrome. We discontinued Neurontin as he is on  Depakote.  9. Vitamin B12 deficiency. He was given 7 day course of B12 injections. We continued oral supplementation.   10. Metabolic syndrome monitoring. Lipid panel, hemoglobin A1c, and TSH were within normal limits.  HIV and RPR were nonreactive.   11. EKG shows sinus bradycardia with a QTC of 434.  12. Brain MRI. Mild cerebrovascular disease without any other abnormalities.   Physical Findings: AIMS:  , ,  ,  , Dental Status Current problems with teeth and/or dentures?: No Does patient usually wear dentures?: No  CIWA:    COWS:     Musculoskeletal: Strength & Muscle Tone: within normal limits Gait & Station: normal Patient leans: N/A  Psychiatric Specialty Exam: Physical Exam  Nursing note and vitals reviewed.   Review of Systems  All other systems reviewed and are negative.   Blood pressure (!) 121/57, pulse 65, temperature 97.8 F (36.6 C), temperature source Oral, resp. rate 20, height 5\' 6"  (1.676 m), weight 75.8 kg (167 lb), SpO2 95 %.Body mass index is 26.95 kg/m.  General Appearance: Casual  Eye Contact:  Good  Speech:  Clear and Coherent  Volume:  Normal  Mood:  Euthymic  Affect:  Appropriate  Thought Process:  Goal Directed and Descriptions of Associations: Intact  Orientation:  Full (Time, Place, and Person)  Thought Content:  WDL  Suicidal Thoughts:  No  Homicidal Thoughts:  No  Memory:  Immediate;   Fair Recent;   Fair Remote;   Fair  Judgement:  Impaired  Insight:  Shallow  Psychomotor Activity:  Normal  Concentration:  Concentration: Fair and Attention Span: Fair  Recall:  [employer] of Knowledge:  Fair  Language:  Fair  Akathisia:  No  Handed:  Right  AIMS (if indicated):     Assets:  Communication Skills Desire for Improvement Financial Resources/Insurance Housing Physical Health Resilience Social Support Transportation  ADL's:  Intact  Cognition:  WNL  Sleep:  Number of Hours: 5.45     Have you used any form of tobacco in  the last 30 days? (Cigarettes, Smokeless Tobacco, Cigars, and/or Pipes): No  Has this patient used any form of tobacco in the last 30 days? (Cigarettes, Smokeless Tobacco, Cigars, and/or Pipes) Yes, No  Blood Alcohol level:  Lab Results  Component Value Date   ETH <5 [id number]    Metabolic Disorder Labs:  Lab Results  Component Value Date   HGBA1C 5.2 09/24/2016   MPG 103 09/24/2016   Lab Results  Component Value Date   PROLACTIN 65.6 (H) 09/24/2016   Lab Results  Component Value Date   CHOL 177 09/24/2016   TRIG 126 09/24/2016   HDL 41 09/24/2016   CHOLHDL 4.3 09/24/2016   VLDL 25 09/24/2016   LDLCALC 111 (H) 09/24/2016    See Psychiatric Specialty Exam and Suicide Risk Assessment completed by Attending Physician prior to discharge.  Discharge destination:  Home  Is patient on multiple antipsychotic therapies at discharge:  No   Has Patient had three or more failed trials of antipsychotic monotherapy by history:  No  Recommended Plan for Multiple Antipsychotic Therapies: NA  Discharge Instructions    Diet - low sodium heart healthy    Complete by:  As directed    Increase activity slowly    Complete by:  As directed      Allergies as of 10/06/2016   No Known Allergies     Medication List    STOP taking these medications   gabapentin 300 MG capsule Commonly known as:  NEURONTIN     TAKE these medications     Indication  ADVAIR DISKUS 250-50 MCG/DOSE Aepb Generic drug:  Fluticasone-Salmeterol Inhale into the lungs.  Indication:  Chronic Obstructive Lung Disease   aspirin 81 MG EC tablet TAKE 1 TABLET (81 MG TOTAL) BY MOUTH ONCE DAILY.  Indication:  Inflammation   atenolol 50 MG tablet Commonly known as:  TENORMIN TAKE 1 TABLET (50 MG TOTAL) BY MOUTH ONCE DAILY.  Indication:  High Blood Pressure Disorder   atorvastatin 80 MG tablet Commonly known as:  LIPITOR TAKE 1 TABLET (80 MG TOTAL) BY MOUTH ONCE DAILY.  Indication:  High Amount of Fats  in the Blood   CALCIUM+D3 GRADUAL RELEASE 600-40-500 MG-MG-UNIT Tb24 Generic drug:  Calcium-Magnesium-Vitamin D Take by mouth.  Indication:  general health   cyanocobalamin 1000 MCG tablet Take 1 tablet (1,000 mcg total) by mouth daily. Start taking on:  10/07/2016  Indication:  Inadequate Vitamin B12   divalproex 500 MG 24 hr tablet Commonly known as:  DEPAKOTE ER Take 3  tablets (1,500 mg total) by mouth at bedtime.  Indication:  Manic Phase of Manic-Depression   fluticasone 50 MCG/ACT nasal spray Commonly known as:  FLONASE 1 spray by Each Nare route daily.  Indication:  Signs and Symptoms of Nose Diseases   lisinopril 5 MG tablet Commonly known as:  PRINIVIL,ZESTRIL TAKE 1 TABLET (5 MG TOTAL) BY MOUTH ONCE DAILY.  Indication:  High Blood Pressure Disorder   methocarbamol 500 MG tablet Commonly known as:  ROBAXIN Take 500 mg by mouth 3 (three) times daily as needed.  Indication:  Musculoskeletal Pain   OLANZapine 10 MG tablet Commonly known as:  ZYPREXA Take 1 tablet (10 mg total) by mouth at bedtime.  Indication:  Manic-Depression   omeprazole 40 MG capsule Commonly known as:  PRILOSEC TAKE 1 CAPSULE (40 MG TOTAL) BY MOUTH 2 (TWO) TIMES DAILY.  Indication:  Gastroesophageal Reflux Disease   PROAIR HFA 108 (90 Base) MCG/ACT inhaler Generic drug:  albuterol Inhale into the lungs.  Indication:  Chronic Obstructive Lung Disease   ticagrelor 90 MG Tabs tablet Commonly known as:  BRILINTA Take by mouth.  Indication:  Acute Coronary Syndrome   timolol 0.5 % ophthalmic solution Commonly known as:  TIMOPTIC USE 1 DROP(S) IN BOTH EYES ONCE A DAY  Indication:  Increased Pressure Within the Eye   tiotropium 18 MCG inhalation capsule Commonly known as:  SPIRIVA Place into inhaler and inhale.  Indication:  Chronic Obstructive Lung Disease   TRAVATAN Z 0.004 % Soln ophthalmic solution Generic drug:  Travoprost (BAK Free) Frequency:PHARMDIR   Dosage:0.0      Instructions:  Note:1 gtt in each eye q pm Dose: 1  Indication:  Persistently Increased Pressure in the Eye      Follow-up Information    Dr. Ceph[facility]. Call on 10/08/2016.   Why:  Please call to reschedule your appointment with Dr. Nicolasa Bowers. You missed an appointment on Wednesday December 27th, 2017 for your hospital follow up for medication management and therapy with Dr. Nicolasa Bowers.  Contact information: Dr. Cephus [facility] [address] [place], [place] [address] Fax: [phone] Ph: [phone]          Follow-up recommendations:  Activity:  As tolerated. Diet:  Low sodium heart healthy. Other:  Keep follow-up appointments.  Comments:    Signed: Orson Slick, MD 10/06/2016, 11:32 AM

## 2016-10-06 NOTE — Discharge Summary (Deleted)
Physician Discharge Summary Note  Patient:  Bradley Bowers is an 70 y.o., male MRN:  [id number] DOB:  1946/02/21 Patient phone:  [phone] (home)  Patient address:   [address] [place] [address],  Total Time spent with patient: 30 minutes  Date of Admission:  09/23/2016 Date of Discharge: 10/06/2016  Reason for Admission:  Psychotic break.  History of Present Illness:   Patient is a 70 year old married Caucasian male who was brought in by his outpatient psychiatrist to our emergency department on December 18.   In August of this year the patient was charged with child pornography after being found with pictures of minors in his cell phone. Patient has a court hearing for his charges in January. In September he was started on Prozac by his primary care provider and was referred to follow-up with Dr. [name] (outpatient psychiatrist). At some point in time the patient was switched from fluoxetine to sertraline which was increased from 50 mg to 100 mg.  The patient had a follow-up with Dr. [name] yesterday and he was found to be hyperreligious, disorganized, and was voicing suicidal ideation.  Per nursing notes in the ER "Pt states it's all because of the computer and he would like to go back in time and shoot whoever invented the computer. Pt states he was not suicidal until he got here. Pt states he now thinks he should kill himself and take seven or eight others with him."  During examination today the patient kept insisting on asking me if I was safe. Patient is a limited historian. He is very tangential. He displays evidence of psychomotor agitation, poor attention and poor concentration pressure speech. There is some grandiosity. Patient talks about his IQ score a his SAT scores.Patient reports talking to God in prayer and hearing the voice of God being his head. He also hears the voice of the devil that tells him to get on on Facebook. Patient denies having problems with his  mood, appetite, energy or sleep. He denies suicidality or homicidality today.  He denies having any traumatic experiences.  Substance abuse patient denies the use of any alcohol or illicit substances. Patient says he was "a drunk" for 22 years. He last used alcohol in 1989  Associated Signs/Symptoms: Depression Symptoms:  suicidal thoughts with specific plan, disturbed sleep, (Hypo) Manic Symptoms:  Distractibility, Elevated Mood, Flight of Ideas, Grandiosity, Impulsivity, Labiality of Mood, Anxiety Symptoms:  Excessive Worry, Psychotic Symptoms:  Hallucinations: Auditory PTSD Symptoms: Negative  Past Psychiatric History: Patient denies any prior history of mental illness prior to summer of this year . Patient denies history of psychiatric hospitalizations or suicidal attempts   Family Psychiatric  History: Patient reports his brother died of liver disease related to alcoholism. He denies any history of mental illness in his family or suicides.   Tobacco Screening: Have you used any form of tobacco in the last 30 days? (Cigarettes, Smokeless Tobacco, Cigars, and/or Pipes): No   Social History: Patient lives with his wife. He has been married for 29 year. They do not have any children. Patient Retired from work in the [employer]. He has some college education. He does not have any military experience. As far as his legal history his raises serious charges related to child pornography     History   Principal Problem: Bipolar affective disorder, current episode manic with psychotic symptoms [facility]) Discharge Diagnoses: Patient Active Problem List   Diagnosis Date Noted  . Coronary artery disease [  I25.10] 09/24/2016  . Glaucoma [H40.9] 09/24/2016  . Bipolar affective disorder, current episode manic with psychotic symptoms ([place]) [F31.2] 09/23/2016  . Chronic obstructive pulmonary disease ([place]) [J44.9] 11/06/2015  . Gastro-esophageal reflux disease without esophagitis [K21.9]  10/24/2014  . Obstructive apnea [G47.33] 10/24/2014  . Restless leg [G25.81] 10/24/2014   Past Medical History:  Past Medical History:  Diagnosis Date  . Arterial vascular disease 10/24/2014   Overview:  MI 1994, small-vessel disease requiring coumadin   . Asthma   . Centrilobular emphysema ([place]) 10/24/2014   Overview:  2L O2 prn   . Chronic obstructive pulmonary disease ([place]) 11/06/2015  . GERD (gastroesophageal reflux disease)   . Glaucoma   . Heart attack 2/17, 1994  . Heart disease   . Hyperlipemia   . Hypertension   . Restless leg 10/24/2014  . Sleep apnea     Past Surgical History:  Procedure Laterality Date  . CATARACT EXTRACTION W/ INTRAOCULAR LENS  IMPLANT, BILATERAL     Family History:  Family History  Problem Relation Age of Onset  . Nephrolithiasis Father   . Prostate cancer Neg Hx   . Bladder Cancer Neg Hx   . Kidney cancer Neg Hx     Social History:  History  Alcohol Use No     History  Drug Use No    Social History   Social History  . Marital status: Married    Spouse name: N/A  . Number of children: N/A  . Years of education: N/A   Social History Main Topics  . Smoking status: Former Research scientist (life sciences)  . Smokeless tobacco: Never Used  . Alcohol use No  . Drug use: No  . Sexual activity: Not Asked   Other Topics Concern  . None   Social History Narrative  . None    Hospital Course:    Mr. Singleton is a 70 year old male with no past psychiatric history admitted in a manic, psychotic episode.   1. Mood and psychosis. We was treated with Zyprexa for psychosis and Depakote for mood stabilization. VPA level 89.  2. HTN. We continued on lisinopril and atenolol. VS have been stable.    3. CAD. We continued Brilinta  4. COPD. We continued albuterol, dulera and spiriva.  5. Dyslipidemia. He is on Lipitor  6. GERD. We continued Protonix  7. Glaucoma. We continued eyedrops.  8. Restless leg syndrome. We discontinued Neurontin as he is on  Depakote.  9. Vitamin B12 deficiency. He was given 7 day course of B12 injections. We continued oral supplementation.   10. Metabolic syndrome monitoring. Lipid panel, hemoglobin A1c, and TSH were within normal limits.  HIV and RPR were nonreactive.   11. EKG shows sinus bradycardia with a QTC of 434.  12. Brain MRI. Mild cerebrovascular disease without any other abnormalities.   Physical Findings: AIMS:  , ,  ,  , Dental Status Current problems with teeth and/or dentures?: No Does patient usually wear dentures?: No  CIWA:    COWS:     Musculoskeletal: Strength & Muscle Tone: within normal limits Gait & Station: normal Patient leans: N/A  Psychiatric Specialty Exam: Physical Exam  Nursing note and vitals reviewed.   Review of Systems  All other systems reviewed and are negative.   Blood pressure (!) 121/57, pulse 65, temperature 97.8 F (36.6 C), temperature source Oral, resp. rate 20, height 5\' 6"  (1.676 m), weight 75.8 kg (167 lb), SpO2 95 %.Body mass index is 26.95 kg/m.  General Appearance: Casual  Eye Contact:  Good  Speech:  Clear and Coherent  Volume:  Normal  Mood:  Euthymic  Affect:  Appropriate  Thought Process:  Goal Directed and Descriptions of Associations: Intact  Orientation:  Full (Time, Place, and Person)  Thought Content:  WDL  Suicidal Thoughts:  No  Homicidal Thoughts:  No  Memory:  Immediate;   Fair Recent;   Fair Remote;   Fair  Judgement:  Impaired  Insight:  Shallow  Psychomotor Activity:  Normal  Concentration:  Concentration: Fair and Attention Span: Fair  Recall:  [employer] of Knowledge:  Fair  Language:  Fair  Akathisia:  No  Handed:  Right  AIMS (if indicated):     Assets:  Communication Skills Desire for Improvement Financial Resources/Insurance Housing Physical Health Resilience Social Support Transportation  ADL's:  Intact  Cognition:  WNL  Sleep:  Number of Hours: 5.45     Have you used any form of tobacco in  the last 30 days? (Cigarettes, Smokeless Tobacco, Cigars, and/or Pipes): No  Has this patient used any form of tobacco in the last 30 days? (Cigarettes, Smokeless Tobacco, Cigars, and/or Pipes) Yes, No  Blood Alcohol level:  Lab Results  Component Value Date   ETH <5 [id number]    Metabolic Disorder Labs:  Lab Results  Component Value Date   HGBA1C 5.2 09/24/2016   MPG 103 09/24/2016   Lab Results  Component Value Date   PROLACTIN 65.6 (H) 09/24/2016   Lab Results  Component Value Date   CHOL 177 09/24/2016   TRIG 126 09/24/2016   HDL 41 09/24/2016   CHOLHDL 4.3 09/24/2016   VLDL 25 09/24/2016   LDLCALC 111 (H) 09/24/2016    See Psychiatric Specialty Exam and Suicide Risk Assessment completed by Attending Physician prior to discharge.  Discharge destination:  Home  Is patient on multiple antipsychotic therapies at discharge:  No   Has Patient had three or more failed trials of antipsychotic monotherapy by history:  No  Recommended Plan for Multiple Antipsychotic Therapies: NA  Discharge Instructions    Diet - low sodium heart healthy    Complete by:  As directed    Increase activity slowly    Complete by:  As directed      Allergies as of 10/06/2016   No Known Allergies     Medication List    TAKE these medications     Indication  ADVAIR DISKUS 250-50 MCG/DOSE Aepb Generic drug:  Fluticasone-Salmeterol Inhale into the lungs.  Indication:  Chronic Obstructive Lung Disease   aspirin 81 MG EC tablet TAKE 1 TABLET (81 MG TOTAL) BY MOUTH ONCE DAILY.  Indication:  Inflammation   atenolol 50 MG tablet Commonly known as:  TENORMIN TAKE 1 TABLET (50 MG TOTAL) BY MOUTH ONCE DAILY.  Indication:  High Blood Pressure Disorder   atorvastatin 80 MG tablet Commonly known as:  LIPITOR TAKE 1 TABLET (80 MG TOTAL) BY MOUTH ONCE DAILY.  Indication:  High Amount of Fats in the Blood   CALCIUM+D3 GRADUAL RELEASE 600-40-500 MG-MG-UNIT Tb24 Generic drug:   Calcium-Magnesium-Vitamin D Take by mouth.  Indication:  general health   cyanocobalamin 1000 MCG tablet Take 1 tablet (1,000 mcg total) by mouth daily. Start taking on:  10/07/2016  Indication:  Inadequate Vitamin B12   divalproex 500 MG 24 hr tablet Commonly known as:  DEPAKOTE ER Take 3 tablets (1,500 mg total) by mouth at bedtime.  Indication:  Manic Phase of Manic-Depression   fluticasone 50  MCG/ACT nasal spray Commonly known as:  FLONASE 1 spray by Each Nare route daily.  Indication:  Signs and Symptoms of Nose Diseases   gabapentin 300 MG capsule Commonly known as:  NEURONTIN Take 300 mg by mouth.  Indication:  Neurogenic Pain   lisinopril 5 MG tablet Commonly known as:  PRINIVIL,ZESTRIL TAKE 1 TABLET (5 MG TOTAL) BY MOUTH ONCE DAILY.  Indication:  High Blood Pressure Disorder   methocarbamol 500 MG tablet Commonly known as:  ROBAXIN Take 500 mg by mouth 3 (three) times daily as needed.  Indication:  Musculoskeletal Pain   OLANZapine 10 MG tablet Commonly known as:  ZYPREXA Take 1 tablet (10 mg total) by mouth at bedtime.  Indication:  Manic-Depression   omeprazole 40 MG capsule Commonly known as:  PRILOSEC TAKE 1 CAPSULE (40 MG TOTAL) BY MOUTH 2 (TWO) TIMES DAILY.  Indication:  Gastroesophageal Reflux Disease   PROAIR HFA 108 (90 Base) MCG/ACT inhaler Generic drug:  albuterol Inhale into the lungs.  Indication:  Chronic Obstructive Lung Disease   ticagrelor 90 MG Tabs tablet Commonly known as:  BRILINTA Take by mouth.  Indication:  Acute Coronary Syndrome   timolol 0.5 % ophthalmic solution Commonly known as:  TIMOPTIC USE 1 DROP(S) IN BOTH EYES ONCE A DAY  Indication:  Increased Pressure Within the Eye   tiotropium 18 MCG inhalation capsule Commonly known as:  SPIRIVA Place into inhaler and inhale.  Indication:  Chronic Obstructive Lung Disease   TRAVATAN Z 0.004 % Soln ophthalmic solution Generic drug:  Travoprost (BAK Free) Frequency:PHARMDIR    Dosage:0.0     Instructions:  Note:1 gtt in each eye q pm Dose: 1  Indication:  Persistently Increased Pressure in the Eye      Follow-up Information    Dr. Cephus Shelling [facility]. Call on 10/08/2016.   Why:  Please call to reschedule your appointment with Dr. [name]. You missed an appointment on Wednesday December 27th, 2017 for your hospital follow up for medication management and therapy with Dr. [name].  Contact information: Dr. Cephus Shelling [facility] [address] [place], [place] [address] Fax: [phone] Ph: [phone]          Follow-up recommendations:  Activity:  As tolerated. Diet:  Low sodium heart healthy. Other:  Keep follow-up appointments.  Comments:    Signed: Orson Slick, MD 10/06/2016, 11:09 AM

## 2016-10-06 NOTE — Plan of Care (Signed)
Problem: Pain Managment: Goal: General experience of comfort will improve Outcome: Progressing Patient is able to verbalize if he is having any discomfort

## 2016-10-06 NOTE — BHH Suicide Risk Assessment (Signed)
[place] INPATIENT:  Family/Significant Other Suicide Prevention Education  Suicide Prevention Education:  Education Completed;Linda Lins (wife [phone]), has been identified by the patient as the family member/significant other with whom the patient will be residing, and identified as the person(s) who will aid the patient in the event of a mental health crisis (suicidal ideations/suicide attempt).  With written consent from the patient, the family member/significant other has been provided the following suicide prevention education, prior to the and/or following the discharge of the patient. Wife states she will call Dr. [name] to reschedule patient's missed appointment on 12/27 due to him being in the hospital. She stated she would make sure patient is under close observations and not left alone once discharged.   The suicide prevention education provided includes the following:  Suicide risk factors  Suicide prevention and interventions  National Suicide Hotline telephone number  [facility] assessment telephone number  [facility] Emergency Assistance [place] and/or Residential Mobile Crisis Unit telephone number  Request made of family/significant other to:  Remove weapons (e.g., guns, rifles, knives), all items previously/currently identified as safety concern.    Remove drugs/medications (over-the-counter, prescriptions, illicit drugs), all items previously/currently identified as a safety concern.  The family member/significant other verbalizes understanding of the suicide prevention education information provided.  The family member/significant other agrees to remove the items of safety concern listed above.  Renisha Cockrum G. [place], [place] 10/06/2016, 11:05 AM

## 2016-10-06 NOTE — BHH Group Notes (Signed)
[place] Group Notes:  (Nursing/MHT/Case Management/Adjunct)  Date:  10/06/2016  Time:  5:46 AM  Type of Therapy:  Psychoeducational Skills  Participation Level:  Did Not Attend  Summary of Progress/Problems:  Reece Agar 10/06/2016, 5:46 AM

## 2016-10-06 NOTE — Progress Notes (Signed)
  [facility] Adult Case Management Discharge Plan :  Will you be returning to the same living situation after discharge:  Yes,  home with wife At discharge, do you have transportation home?: Yes,  wife Do you have the ability to pay for your medications: Yes,  patient has insurance  Release of information consent forms completed and in the chart;  Patient's signature needed at discharge.  Patient to Follow up at: Follow-up Information    Dr. Cephus [facility]ing [facility]. Call on 10/08/2016.   Why:  Please call to reschedule your appointment with Dr. [name]. You missed an appointment on Wednesday December 27th, 2017 for your hospital follow up for medication management and therapy with Dr. [name].  Contact information: Dr. Cephus Shelling [facility] [address] [place], [place] [address] Fax: [phone] Ph: [phone]          Next level of care provider has access to [place] and Suicide Prevention discussed: Yes,  with patient and wife  Have you used any form of tobacco in the last 30 days? (Cigarettes, Smokeless Tobacco, Cigars, and/or Pipes): No  Has patient been referred to the Quitline?: Patient refused referral  Patient has been referred for addiction treatment: Yes  Pansy Ostrovsky G. [place], [place] 10/06/2016, 11:06 AM

## 2016-10-06 NOTE — Progress Notes (Signed)
Pt awake, alert, and up on unit this morning. Appropriately interacts with staff/peers. Reports good sleep last night without the help of sleep medications. Reports good appetite, high energy, good concentration. Rates depression 0/10, anxiety 0/10, hopelessness 0/10. Denies SI/HI/AVH. Goal today is "leaving [place] in good health people skills" by "talk to people." Pt is medication compliant.   Support and encouragement provided with use of therapeutic communication. Medications administered as ordered with education. Safety maintained with every 15 minute checks. Will continue to monitor.

## 2016-10-06 NOTE — B[facility] Group Notes (Signed)
10/05/2016 100 PM  Type of Therapy:  Group Therapy Balance in Life  Participation Level:  Active  Participation Quality:  Attentive  Affect:  Appropriate  Cognitive:  Appropriate  Insight:  Engaged  Engagement in Therapy:  Developing/Improving  Modes of Intervention:  Activity, Clarification, Discussion, Education, Exploration and Socialization   Summary of Progress/Problems: The topic for today was Balance in Life. Peers were asked to reflect on 2 incidents when they lives were unbalanced.  With facilitation from  group Leader those situations were reflected by patient and their peers.   Pt discussed coping skills that can be used for relapse prevention. This patient was able to be supportive and this patients feelings was supported by this worker and peers Patient were then to review ways to reduce the inbalance in their lives and to share options that assisted them in regaining balance  ( taking medications, keep doctors appointmnets, create a daily routine,excercize, volunteer, call friend/family and several stress reduction techniques were reviewed.)  This patient kept a good sense of humour but reported he was sleepy ( he did nod off in group)  Bradley Bowers Dec 30th,2017 2:30pm

## 2016-10-08 NOTE — Clinical Social Work Note (Signed)
PAtient missed appointment w [facility]) on 12/27 as he was hospitalized.  CSW contacted [employer] and requested reschedule.  Appt provided for 11/08/16 @9am  w Dr. Gretel Acre.  [employer] notifying patient of reschedule time/date.  Edwyna Shell, LCSW Lead Clinical Social Worker Phone:  [phone]

## 2016-10-09 DIAGNOSIS — F429 Obsessive-compulsive disorder, unspecified: Secondary | ICD-10-CM | POA: Diagnosis not present

## 2016-10-09 DIAGNOSIS — F5105 Insomnia due to other mental disorder: Secondary | ICD-10-CM | POA: Diagnosis not present

## 2016-10-09 DIAGNOSIS — F39 Unspecified mood [affective] disorder: Secondary | ICD-10-CM | POA: Diagnosis not present

## 2016-10-09 DIAGNOSIS — J40 Bronchitis, not specified as acute or chronic: Secondary | ICD-10-CM | POA: Diagnosis not present

## 2016-10-09 DIAGNOSIS — F1021 Alcohol dependence, in remission: Secondary | ICD-10-CM | POA: Diagnosis not present

## 2016-10-16 DIAGNOSIS — F5105 Insomnia due to other mental disorder: Secondary | ICD-10-CM | POA: Diagnosis not present

## 2016-10-16 DIAGNOSIS — F429 Obsessive-compulsive disorder, unspecified: Secondary | ICD-10-CM | POA: Diagnosis not present

## 2016-10-16 DIAGNOSIS — F3173 Bipolar disorder, in partial remission, most recent episode manic: Secondary | ICD-10-CM | POA: Diagnosis not present

## 2016-10-29 DIAGNOSIS — F5105 Insomnia due to other mental disorder: Secondary | ICD-10-CM | POA: Diagnosis not present

## 2016-10-29 DIAGNOSIS — F3173 Bipolar disorder, in partial remission, most recent episode manic: Secondary | ICD-10-CM | POA: Diagnosis not present

## 2016-10-29 DIAGNOSIS — F1021 Alcohol dependence, in remission: Secondary | ICD-10-CM | POA: Diagnosis not present

## 2016-10-29 DIAGNOSIS — F429 Obsessive-compulsive disorder, unspecified: Secondary | ICD-10-CM | POA: Diagnosis not present

## 2016-10-30 DIAGNOSIS — F419 Anxiety disorder, unspecified: Secondary | ICD-10-CM | POA: Diagnosis not present

## 2016-10-31 DIAGNOSIS — Z125 Encounter for screening for malignant neoplasm of prostate: Secondary | ICD-10-CM | POA: Diagnosis not present

## 2016-10-31 DIAGNOSIS — I2119 ST elevation (STEMI) myocardial infarction involving other coronary artery of inferior wall: Secondary | ICD-10-CM | POA: Diagnosis not present

## 2016-10-31 DIAGNOSIS — I7789 Other specified disorders of arteries and arterioles: Secondary | ICD-10-CM | POA: Diagnosis not present

## 2016-10-31 DIAGNOSIS — Z0000 Encounter for general adult medical examination without abnormal findings: Secondary | ICD-10-CM | POA: Diagnosis not present

## 2016-10-31 DIAGNOSIS — J432 Centrilobular emphysema: Secondary | ICD-10-CM | POA: Diagnosis not present

## 2016-11-08 ENCOUNTER — Ambulatory Visit: Payer: Self-pay | Admitting: Psychiatry

## 2016-11-20 DIAGNOSIS — F429 Obsessive-compulsive disorder, unspecified: Secondary | ICD-10-CM | POA: Diagnosis not present

## 2016-11-20 DIAGNOSIS — F3173 Bipolar disorder, in partial remission, most recent episode manic: Secondary | ICD-10-CM | POA: Diagnosis not present

## 2016-11-20 DIAGNOSIS — F5105 Insomnia due to other mental disorder: Secondary | ICD-10-CM | POA: Diagnosis not present

## 2016-11-20 DIAGNOSIS — F1021 Alcohol dependence, in remission: Secondary | ICD-10-CM | POA: Diagnosis not present

## 2016-11-21 DIAGNOSIS — K219 Gastro-esophageal reflux disease without esophagitis: Secondary | ICD-10-CM | POA: Diagnosis not present

## 2016-11-21 DIAGNOSIS — I2510 Atherosclerotic heart disease of native coronary artery without angina pectoris: Secondary | ICD-10-CM | POA: Diagnosis not present

## 2016-11-21 DIAGNOSIS — J432 Centrilobular emphysema: Secondary | ICD-10-CM | POA: Diagnosis not present

## 2016-11-22 DIAGNOSIS — F3173 Bipolar disorder, in partial remission, most recent episode manic: Secondary | ICD-10-CM | POA: Diagnosis not present

## 2016-12-06 DIAGNOSIS — J40 Bronchitis, not specified as acute or chronic: Secondary | ICD-10-CM | POA: Diagnosis not present

## 2016-12-20 DIAGNOSIS — F3173 Bipolar disorder, in partial remission, most recent episode manic: Secondary | ICD-10-CM | POA: Diagnosis not present

## 2016-12-20 DIAGNOSIS — F5105 Insomnia due to other mental disorder: Secondary | ICD-10-CM | POA: Diagnosis not present

## 2016-12-20 DIAGNOSIS — F1021 Alcohol dependence, in remission: Secondary | ICD-10-CM | POA: Diagnosis not present

## 2016-12-20 DIAGNOSIS — F429 Obsessive-compulsive disorder, unspecified: Secondary | ICD-10-CM | POA: Diagnosis not present

## 2016-12-25 ENCOUNTER — Other Ambulatory Visit: Payer: Self-pay | Admitting: Psychiatry

## 2016-12-27 DIAGNOSIS — F411 Generalized anxiety disorder: Secondary | ICD-10-CM | POA: Diagnosis not present

## 2017-01-27 DIAGNOSIS — F5105 Insomnia due to other mental disorder: Secondary | ICD-10-CM | POA: Diagnosis not present

## 2017-01-27 DIAGNOSIS — F429 Obsessive-compulsive disorder, unspecified: Secondary | ICD-10-CM | POA: Diagnosis not present

## 2017-01-27 DIAGNOSIS — F1021 Alcohol dependence, in remission: Secondary | ICD-10-CM | POA: Diagnosis not present

## 2017-01-27 DIAGNOSIS — F3173 Bipolar disorder, in partial remission, most recent episode manic: Secondary | ICD-10-CM | POA: Diagnosis not present

## 2017-02-04 DIAGNOSIS — J4540 Moderate persistent asthma, uncomplicated: Secondary | ICD-10-CM | POA: Diagnosis not present

## 2017-04-07 DIAGNOSIS — F1021 Alcohol dependence, in remission: Secondary | ICD-10-CM | POA: Diagnosis not present

## 2017-04-07 DIAGNOSIS — F3173 Bipolar disorder, in partial remission, most recent episode manic: Secondary | ICD-10-CM | POA: Diagnosis not present

## 2017-04-07 DIAGNOSIS — F5105 Insomnia due to other mental disorder: Secondary | ICD-10-CM | POA: Diagnosis not present

## 2017-04-07 DIAGNOSIS — F429 Obsessive-compulsive disorder, unspecified: Secondary | ICD-10-CM | POA: Diagnosis not present

## 2017-04-15 DIAGNOSIS — H401131 Primary open-angle glaucoma, bilateral, mild stage: Secondary | ICD-10-CM | POA: Diagnosis not present

## 2017-04-23 DIAGNOSIS — I2510 Atherosclerotic heart disease of native coronary artery without angina pectoris: Secondary | ICD-10-CM | POA: Diagnosis not present

## 2017-04-23 DIAGNOSIS — Z0000 Encounter for general adult medical examination without abnormal findings: Secondary | ICD-10-CM | POA: Diagnosis not present

## 2017-04-25 DIAGNOSIS — M7989 Other specified soft tissue disorders: Secondary | ICD-10-CM | POA: Diagnosis not present

## 2017-04-25 DIAGNOSIS — S61452A Open bite of left hand, initial encounter: Secondary | ICD-10-CM | POA: Diagnosis not present

## 2017-04-25 DIAGNOSIS — W540XXA Bitten by dog, initial encounter: Secondary | ICD-10-CM | POA: Diagnosis not present

## 2017-04-30 DIAGNOSIS — J432 Centrilobular emphysema: Secondary | ICD-10-CM | POA: Diagnosis not present

## 2017-04-30 DIAGNOSIS — I7789 Other specified disorders of arteries and arterioles: Secondary | ICD-10-CM | POA: Diagnosis not present

## 2017-04-30 DIAGNOSIS — Z79899 Other long term (current) drug therapy: Secondary | ICD-10-CM | POA: Diagnosis not present

## 2017-04-30 DIAGNOSIS — Z125 Encounter for screening for malignant neoplasm of prostate: Secondary | ICD-10-CM | POA: Diagnosis not present

## 2017-05-15 DIAGNOSIS — I252 Old myocardial infarction: Secondary | ICD-10-CM | POA: Diagnosis not present

## 2017-05-15 DIAGNOSIS — I2510 Atherosclerotic heart disease of native coronary artery without angina pectoris: Secondary | ICD-10-CM | POA: Diagnosis not present

## 2017-06-30 DIAGNOSIS — F5105 Insomnia due to other mental disorder: Secondary | ICD-10-CM | POA: Diagnosis not present

## 2017-06-30 DIAGNOSIS — F429 Obsessive-compulsive disorder, unspecified: Secondary | ICD-10-CM | POA: Diagnosis not present

## 2017-06-30 DIAGNOSIS — F3173 Bipolar disorder, in partial remission, most recent episode manic: Secondary | ICD-10-CM | POA: Diagnosis not present

## 2017-06-30 DIAGNOSIS — F1021 Alcohol dependence, in remission: Secondary | ICD-10-CM | POA: Diagnosis not present

## 2017-09-22 DIAGNOSIS — F5105 Insomnia due to other mental disorder: Secondary | ICD-10-CM | POA: Diagnosis not present

## 2017-09-22 DIAGNOSIS — F3173 Bipolar disorder, in partial remission, most recent episode manic: Secondary | ICD-10-CM | POA: Diagnosis not present

## 2017-09-22 DIAGNOSIS — F1021 Alcohol dependence, in remission: Secondary | ICD-10-CM | POA: Diagnosis not present

## 2017-09-22 DIAGNOSIS — F429 Obsessive-compulsive disorder, unspecified: Secondary | ICD-10-CM | POA: Diagnosis not present

## 2017-10-08 DIAGNOSIS — F3173 Bipolar disorder, in partial remission, most recent episode manic: Secondary | ICD-10-CM | POA: Diagnosis not present

## 2017-10-14 DIAGNOSIS — H401131 Primary open-angle glaucoma, bilateral, mild stage: Secondary | ICD-10-CM | POA: Diagnosis not present

## 2017-10-18 DIAGNOSIS — F5105 Insomnia due to other mental disorder: Secondary | ICD-10-CM | POA: Diagnosis not present

## 2017-10-18 DIAGNOSIS — F1021 Alcohol dependence, in remission: Secondary | ICD-10-CM | POA: Diagnosis not present

## 2017-10-18 DIAGNOSIS — F429 Obsessive-compulsive disorder, unspecified: Secondary | ICD-10-CM | POA: Diagnosis not present

## 2017-10-18 DIAGNOSIS — F3173 Bipolar disorder, in partial remission, most recent episode manic: Secondary | ICD-10-CM | POA: Diagnosis not present

## 2017-10-21 DIAGNOSIS — H401131 Primary open-angle glaucoma, bilateral, mild stage: Secondary | ICD-10-CM | POA: Diagnosis not present

## 2017-10-23 DIAGNOSIS — I7789 Other specified disorders of arteries and arterioles: Secondary | ICD-10-CM | POA: Diagnosis not present

## 2017-10-23 DIAGNOSIS — Z79899 Other long term (current) drug therapy: Secondary | ICD-10-CM | POA: Diagnosis not present

## 2017-10-23 DIAGNOSIS — Z125 Encounter for screening for malignant neoplasm of prostate: Secondary | ICD-10-CM | POA: Diagnosis not present

## 2017-10-23 DIAGNOSIS — I2510 Atherosclerotic heart disease of native coronary artery without angina pectoris: Secondary | ICD-10-CM | POA: Diagnosis not present

## 2017-11-04 DIAGNOSIS — J432 Centrilobular emphysema: Secondary | ICD-10-CM | POA: Diagnosis not present

## 2017-11-04 DIAGNOSIS — Z0000 Encounter for general adult medical examination without abnormal findings: Secondary | ICD-10-CM | POA: Diagnosis not present

## 2017-11-04 DIAGNOSIS — I2510 Atherosclerotic heart disease of native coronary artery without angina pectoris: Secondary | ICD-10-CM | POA: Diagnosis not present

## 2017-11-04 DIAGNOSIS — I7789 Other specified disorders of arteries and arterioles: Secondary | ICD-10-CM | POA: Diagnosis not present

## 2017-12-05 DIAGNOSIS — I7789 Other specified disorders of arteries and arterioles: Secondary | ICD-10-CM | POA: Diagnosis not present

## 2017-12-05 DIAGNOSIS — E782 Mixed hyperlipidemia: Secondary | ICD-10-CM | POA: Diagnosis not present

## 2017-12-05 DIAGNOSIS — I2510 Atherosclerotic heart disease of native coronary artery without angina pectoris: Secondary | ICD-10-CM | POA: Diagnosis not present

## 2017-12-24 DIAGNOSIS — K2270 Barrett's esophagus without dysplasia: Secondary | ICD-10-CM | POA: Diagnosis not present

## 2017-12-24 DIAGNOSIS — Z86010 Personal history of colonic polyps: Secondary | ICD-10-CM | POA: Diagnosis not present

## 2018-01-13 DIAGNOSIS — F1021 Alcohol dependence, in remission: Secondary | ICD-10-CM | POA: Diagnosis not present

## 2018-01-13 DIAGNOSIS — F5105 Insomnia due to other mental disorder: Secondary | ICD-10-CM | POA: Diagnosis not present

## 2018-01-13 DIAGNOSIS — F3173 Bipolar disorder, in partial remission, most recent episode manic: Secondary | ICD-10-CM | POA: Diagnosis not present

## 2018-01-13 DIAGNOSIS — F429 Obsessive-compulsive disorder, unspecified: Secondary | ICD-10-CM | POA: Diagnosis not present

## 2018-02-18 ENCOUNTER — Encounter: Payer: Self-pay | Admitting: *Deleted

## 2018-02-19 ENCOUNTER — Encounter: Payer: Self-pay | Admitting: *Deleted

## 2018-02-19 ENCOUNTER — Encounter
Admission: RE | Disposition: A | Discharge status: Home / Self Care | Payer: Self-pay | Source: Ambulatory Visit | Attending: Gastroenterology

## 2018-02-19 ENCOUNTER — Ambulatory Visit: Payer: PPO | Admitting: Anesthesiology

## 2018-02-19 ENCOUNTER — Ambulatory Visit
Admission: RE | Admit: 2018-02-19 | Discharge: 2018-02-19 | Disposition: A | Discharge status: Home / Self Care | Payer: PPO | Source: Ambulatory Visit | Attending: Gastroenterology | Admitting: Gastroenterology

## 2018-02-19 DIAGNOSIS — J432 Centrilobular emphysema: Secondary | ICD-10-CM | POA: Insufficient documentation

## 2018-02-19 DIAGNOSIS — J449 Chronic obstructive pulmonary disease, unspecified: Secondary | ICD-10-CM | POA: Diagnosis not present

## 2018-02-19 DIAGNOSIS — F319 Bipolar disorder, unspecified: Secondary | ICD-10-CM | POA: Insufficient documentation

## 2018-02-19 DIAGNOSIS — K2980 Duodenitis without bleeding: Secondary | ICD-10-CM | POA: Diagnosis not present

## 2018-02-19 DIAGNOSIS — K3189 Other diseases of stomach and duodenum: Secondary | ICD-10-CM | POA: Diagnosis not present

## 2018-02-19 DIAGNOSIS — K5730 Diverticulosis of large intestine without perforation or abscess without bleeding: Secondary | ICD-10-CM | POA: Diagnosis not present

## 2018-02-19 DIAGNOSIS — Z79899 Other long term (current) drug therapy: Secondary | ICD-10-CM | POA: Diagnosis not present

## 2018-02-19 DIAGNOSIS — I252 Old myocardial infarction: Secondary | ICD-10-CM | POA: Insufficient documentation

## 2018-02-19 DIAGNOSIS — D175 Benign lipomatous neoplasm of intra-abdominal organs: Secondary | ICD-10-CM | POA: Insufficient documentation

## 2018-02-19 DIAGNOSIS — I10 Essential (primary) hypertension: Secondary | ICD-10-CM | POA: Insufficient documentation

## 2018-02-19 DIAGNOSIS — G2581 Restless legs syndrome: Secondary | ICD-10-CM | POA: Insufficient documentation

## 2018-02-19 DIAGNOSIS — I2510 Atherosclerotic heart disease of native coronary artery without angina pectoris: Secondary | ICD-10-CM | POA: Diagnosis not present

## 2018-02-19 DIAGNOSIS — K449 Diaphragmatic hernia without obstruction or gangrene: Secondary | ICD-10-CM | POA: Insufficient documentation

## 2018-02-19 DIAGNOSIS — Z1211 Encounter for screening for malignant neoplasm of colon: Secondary | ICD-10-CM | POA: Insufficient documentation

## 2018-02-19 DIAGNOSIS — G4730 Sleep apnea, unspecified: Secondary | ICD-10-CM | POA: Insufficient documentation

## 2018-02-19 DIAGNOSIS — Z87891 Personal history of nicotine dependence: Secondary | ICD-10-CM | POA: Insufficient documentation

## 2018-02-19 DIAGNOSIS — K2970 Gastritis, unspecified, without bleeding: Secondary | ICD-10-CM | POA: Insufficient documentation

## 2018-02-19 DIAGNOSIS — Z9981 Dependence on supplemental oxygen: Secondary | ICD-10-CM | POA: Diagnosis not present

## 2018-02-19 DIAGNOSIS — E785 Hyperlipidemia, unspecified: Secondary | ICD-10-CM | POA: Insufficient documentation

## 2018-02-19 DIAGNOSIS — Z7982 Long term (current) use of aspirin: Secondary | ICD-10-CM | POA: Insufficient documentation

## 2018-02-19 DIAGNOSIS — Z86010 Personal history of colonic polyps: Secondary | ICD-10-CM | POA: Insufficient documentation

## 2018-02-19 DIAGNOSIS — K219 Gastro-esophageal reflux disease without esophagitis: Secondary | ICD-10-CM | POA: Diagnosis not present

## 2018-02-19 DIAGNOSIS — K222 Esophageal obstruction: Secondary | ICD-10-CM | POA: Insufficient documentation

## 2018-02-19 DIAGNOSIS — K2270 Barrett's esophagus without dysplasia: Secondary | ICD-10-CM | POA: Insufficient documentation

## 2018-02-19 DIAGNOSIS — D1779 Benign lipomatous neoplasm of other sites: Secondary | ICD-10-CM | POA: Diagnosis not present

## 2018-02-19 DIAGNOSIS — K5792 Diverticulitis of intestine, part unspecified, without perforation or abscess without bleeding: Secondary | ICD-10-CM | POA: Diagnosis not present

## 2018-02-19 DIAGNOSIS — K2990 Gastroduodenitis, unspecified, without bleeding: Secondary | ICD-10-CM | POA: Diagnosis not present

## 2018-02-19 DIAGNOSIS — K2960 Other gastritis without bleeding: Secondary | ICD-10-CM | POA: Diagnosis not present

## 2018-02-19 DIAGNOSIS — K5790 Diverticulosis of intestine, part unspecified, without perforation or abscess without bleeding: Secondary | ICD-10-CM | POA: Diagnosis not present

## 2018-02-19 DIAGNOSIS — K210 Gastro-esophageal reflux disease with esophagitis: Secondary | ICD-10-CM | POA: Diagnosis not present

## 2018-02-19 HISTORY — DX: Atherosclerotic heart disease of native coronary artery without angina pectoris: I25.10

## 2018-02-19 HISTORY — PX: COLONOSCOPY WITH PROPOFOL: SHX5780

## 2018-02-19 HISTORY — PX: ESOPHAGOGASTRODUODENOSCOPY (EGD) WITH PROPOFOL: SHX5813

## 2018-02-19 SURGERY — COLONOSCOPY WITH PROPOFOL
Anesthesia: General

## 2018-02-19 MED ORDER — SODIUM CHLORIDE 0.9 % IV SOLN
INTRAVENOUS | Status: DC
  Administered 2018-02-19: 09:00:00 via INTRAVENOUS

## 2018-02-19 MED ORDER — EPHEDRINE SULFATE 50 MG/ML IJ SOLN
INTRAMUSCULAR | Status: DC | PRN
  Administered 2018-02-19 (×2): 10 mg via INTRAVENOUS

## 2018-02-19 MED ORDER — GLYCOPYRROLATE 0.2 MG/ML IJ SOLN
INTRAMUSCULAR | Status: DC | PRN
  Administered 2018-02-19: 0.2 mg via INTRAVENOUS

## 2018-02-19 MED ORDER — FENTANYL CITRATE (PF) 100 MCG/2ML IJ SOLN
INTRAMUSCULAR | Status: AC
  Filled 2018-02-19: qty 2

## 2018-02-19 MED ORDER — LIDOCAINE HCL (CARDIAC) PF 100 MG/5ML IV SOSY
PREFILLED_SYRINGE | INTRAVENOUS | Status: DC | PRN
  Administered 2018-02-19: 100 mg via INTRAVENOUS

## 2018-02-19 MED ORDER — PROPOFOL 10 MG/ML IV BOLUS
INTRAVENOUS | Status: AC
  Filled 2018-02-19: qty 40

## 2018-02-19 MED ORDER — FENTANYL CITRATE (PF) 100 MCG/2ML IJ SOLN
INTRAMUSCULAR | Status: DC | PRN
  Administered 2018-02-19: 50 ug via INTRAVENOUS

## 2018-02-19 MED ORDER — PROPOFOL 10 MG/ML IV BOLUS
INTRAVENOUS | Status: DC | PRN
  Administered 2018-02-19: 80 mg via INTRAVENOUS

## 2018-02-19 MED ORDER — IPRATROPIUM-ALBUTEROL 0.5-2.5 (3) MG/3ML IN SOLN: 3.0000 mL | Freq: Once | RESPIRATORY_TRACT | Status: DC

## 2018-02-19 MED ORDER — PROPOFOL 500 MG/50ML IV EMUL
INTRAVENOUS | Status: DC | PRN
  Administered 2018-02-19: 100 ug/kg/min via INTRAVENOUS

## 2018-02-19 MED ORDER — PHENYLEPHRINE HCL 10 MG/ML IJ SOLN
INTRAMUSCULAR | Status: DC | PRN
  Administered 2018-02-19: 100 ug via INTRAVENOUS

## 2018-02-19 NOTE — Transfer of Care (Signed)
Immediate Anesthesia Transfer of Care Note  Patient: Bradley Bowers  Procedure(s) Performed: COLONOSCOPY WITH PROPOFOL (N/A ) ESOPHAGOGASTRODUODENOSCOPY (EGD) WITH PROPOFOL (N/A )  Patient Location: PACU  Anesthesia Type:General  Level of Consciousness: awake, alert  and oriented  Airway & Oxygen Therapy: Patient Spontanous Breathing and Patient connected to nasal cannula oxygen  Post-op Assessment: Report given to RN and Post -op Vital signs reviewed and stable  Post vital signs: Reviewed and stable  Last Vitals:  Vitals Value Taken Time  BP    Temp    Pulse 63 02/19/2018  9:28 AM  Resp 13 02/19/2018  9:28 AM  SpO2 99 % 02/19/2018  9:28 AM  Vitals shown include unvalidated device data.  Last Pain:  Vitals:   02/19/18 0818  TempSrc: Tympanic  PainSc: 0-No pain         Complications: No apparent anesthesia complications

## 2018-02-19 NOTE — Op Note (Signed)
[facility] Patient Name: Bradley Bowers Procedure Date: 02/19/2018 8:43 AM MRN: [id number] Account #: [id number] Date of Birth: 1945/11/27 Admit Type: Outpatient Age: 72 Room: [facility] ENDO ROOM 3 Gender: Male Note Status: Finalized Procedure:            Colonoscopy Indications:          Personal history of colonic polyps Providers:            Lollie Sails, MD Referring MD:         Rusty Aus, MD (Referring MD) Medicines:            Monitored Anesthesia Care Complications:        No immediate complications. Procedure:            Pre-Anesthesia Assessment:                       - ASA Grade Assessment: III - A patient with severe                        systemic disease.                       After obtaining informed consent, the colonoscope was                        passed under direct vision. Throughout the procedure,                        the patient's blood pressure, pulse, and oxygen                        saturations were monitored continuously. The                        Colonoscope was introduced through the anus and                        advanced to the the cecum, identified by appendiceal                        orifice and ileocecal valve. The colonoscopy was                        performed without difficulty. The patient tolerated the                        procedure well. The quality of the bowel preparation                        was good. Findings:      There was a medium-sized lipoma, 15 mm in diameter, in the descending       colon, normal mucosa, positive pillow sign.      Multiple smallto medium-mouthed diverticula were found in the sigmoid       colon and descending colon.      Multiple small-mouthed diverticula were found in the sigmoid colon.       Petechia were visualized in association with the diverticular opening.      The retroflexed view of the distal rectum and anal verge was normal and       showed no anal  or rectal abnormalities.      The digital rectal exam was normal, note small skin tags externally. Impression:           - Medium-sized lipoma in the descending colon.                       - Diverticulosis in the sigmoid colon and in the                        descending colon.                       - Mild diverticulosis in the sigmoid colon. Petechia                        were visualized in association with the diverticular                        opening.                       - The distal rectum and anal verge are normal on                        retroflexion view.                       - No specimens collected. Recommendation:       - Discharge patient to home.                       - Repeat colonoscopy in 5 years for surveillance. Procedure Code(s):    --- Professional ---                       [phone], Colonoscopy, flexible; diagnostic, including                        collection of specimen(s) by brushing or washing, when                        performed (separate procedure) Diagnosis Code(s):    --- Professional ---                       D17.5, Benign lipomatous neoplasm of intra-abdominal                        organs                       Z86.010, Personal history of colonic polyps                       K57.30, Diverticulosis of large intestine without                        perforation or abscess without bleeding CPT copyright 2017 American Medical Association. All rights reserved. The codes documented in this report are preliminary and upon coder review may  be revised to meet current compliance requirements. Lollie Sails, MD 02/19/2018 9:28:16 AM This report has been signed electronically. Number of Addenda: 0 Note Initiated On: 02/19/2018 8:43 AM Scope Withdrawal Time: 0 hours 9 minutes 18 seconds  Total Procedure Duration: 0 hours 15 minutes 17 seconds       [facility]

## 2018-02-19 NOTE — Anesthesia Post-op Follow-up Note (Signed)
Anesthesia QCDR form completed.        

## 2018-02-19 NOTE — H&P (Signed)
Outpatient short stay form Pre-procedure 02/19/2018 8:24 AM Bradley Sails MD  Primary Physician: Dr. Emily Filbert  Reason for visit: EGD and colonoscopy  History of present illness: Patient is a 72 year old male presenting today as above.  He has a personal history of Barrett's esophagus as well as adenomatous colon polyps. He does take a daily PPI and has no symptoms of either heartburn or dysphagia.  He tolerated his prep well.  He takes no other aspirin products or blood thinning agents with the exception of 81 mg aspirin.  He is held that for a couple of days.  Records indicate that he takes Brilinta however he denies taking that since February when he was taken off of the medication.  He takes no blood thinner with the exception of the 81 mg aspirin.   Current Facility-Administered Medications:  .  0.9 %  sodium chloride infusion, , Intravenous, Continuous, Bradley Sails, MD .  0.9 %  sodium chloride infusion, , Intravenous, Continuous, Bradley Sails, MD  Medications Prior to Admission  Medication Sig Dispense Refill Last Dose  . albuterol (PROAIR HFA) 108 (90 Base) MCG/ACT inhaler Inhale into the lungs.   02/19/2018 at 0600  . aspirin 81 MG EC tablet TAKE 1 TABLET (81 MG TOTAL) BY MOUTH ONCE DAILY.  11 02/18/2018 at Unknown time  . atenolol (TENORMIN) 50 MG tablet TAKE 1 TABLET (50 MG TOTAL) BY MOUTH ONCE DAILY.  1 02/19/2018 at Unknown time  . atorvastatin (LIPITOR) 80 MG tablet TAKE 1 TABLET (80 MG TOTAL) BY MOUTH ONCE DAILY.  1 02/19/2018 at Unknown time  . Calcium-Magnesium-Vitamin D (CALCIUM+D3 GRADUAL RELEASE) 600-40-500 MG-MG-UNIT TB24 Take by mouth.   02/18/2018 at Unknown time  . divalproex (DEPAKOTE ER) 500 MG 24 hr tablet Take 3 tablets (1,500 mg total) by mouth at bedtime. 90 tablet 1 02/18/2018 at Unknown time  . fluticasone (FLONASE) 50 MCG/ACT nasal spray 1 spray by Each Nare route daily.   02/18/2018 at Unknown time  . Fluticasone-Salmeterol (ADVAIR DISKUS)  250-50 MCG/DOSE AEPB Inhale into the lungs.   02/19/2018 at 0600  . lisinopril (PRINIVIL,ZESTRIL) 5 MG tablet TAKE 1 TABLET (5 MG TOTAL) BY MOUTH ONCE DAILY.  11 02/19/2018 at 0600  . methocarbamol (ROBAXIN) 500 MG tablet Take 500 mg by mouth 3 (three) times daily as needed.  1 02/19/2018 at 0600  . montelukast (SINGULAIR) 10 MG tablet Take 10 mg by mouth at bedtime.   02/19/2018 at 0600  . OLANZapine (ZYPREXA) 10 MG tablet Take 1 tablet (10 mg total) by mouth at bedtime. 30 tablet 1 02/19/2018 at 0600  . omeprazole (PRILOSEC) 40 MG capsule TAKE 1 CAPSULE (40 MG TOTAL) BY MOUTH 2 (TWO) TIMES DAILY.  3 02/19/2018 at 0600  . timolol (TIMOPTIC) 0.5 % ophthalmic solution USE 1 DROP(S) IN BOTH EYES ONCE A DAY  5 02/19/2018 at 0600  . tiotropium (SPIRIVA) 18 MCG inhalation capsule Place into inhaler and inhale.   02/19/2018 at 0600  . Travoprost, BAK Free, (TRAVATAN Z) 0.004 % SOLN ophthalmic solution Frequency:PHARMDIR   Dosage:0.0     Instructions:  Note:1 gtt in each eye q pm Dose: 1   02/19/2018 at 0600  . vitamin B-12 1000 MCG tablet Take 1 tablet (1,000 mcg total) by mouth daily. 30 tablet 1 02/19/2018 at 0600  . ticagrelor (BRILINTA) 90 MG TABS tablet Take by mouth.   Not Taking at 0600     No Known Allergies   Past Medical History:  Diagnosis  Date  . Arterial vascular disease 10/24/2014   Overview:  MI 1994, small-vessel disease requiring coumadin   . Asthma   . Centrilobular emphysema ([place]) 10/24/2014   Overview:  2L O2 prn   . Centrilobular emphysema ([place])   . Chronic obstructive pulmonary disease ([place]) 11/06/2015  . Coronary artery disease   . GERD (gastroesophageal reflux disease)   . Glaucoma   . Heart attack ([place]) 2/17, 1994  . Heart disease   . Hyperlipemia   . Hypertension   . Restless leg 10/24/2014  . Sleep apnea     Review of systems:      Physical Exam    Heart and lungs: Regular rate and rhythm without rub or gallop, lungs are bilaterally clear.    HEENT: Normocephalic  atraumatic eyes are anicteric    Other:    Pertinant exam for procedure: Soft nontender nondistended bowel sounds positive normoactive.    Planned proceedures: EGD, colonoscopy and indicated procedures. I have discussed the risks benefits and complications of procedures to include not limited to bleeding, infection, perforation and the risk of sedation and the patient wishes to proceed.    Bradley Sails, MD Gastroenterology 02/19/2018  8:24 AM

## 2018-02-19 NOTE — Anesthesia Preprocedure Evaluation (Signed)
Anesthesia Evaluation  Patient identified by MRN, date of birth, ID band Patient awake    Reviewed: Allergy & Precautions, H&P , NPO status , Patient's Chart, lab work & pertinent test results  History of Anesthesia Complications Negative for: history of anesthetic complications  Airway Mallampati: III  TM Distance: <3 FB Neck ROM: limited    Dental  (+) Chipped, Poor Dentition   Pulmonary neg shortness of breath, asthma , sleep apnea , COPD,  COPD inhaler, former smoker,           Cardiovascular Exercise Tolerance: Good hypertension, (-) angina+ CAD, + Past MI and + Cardiac Stents  (-) DOE      Neuro/Psych PSYCHIATRIC DISORDERS Bipolar Disorder negative neurological ROS     GI/Hepatic Neg liver ROS, GERD  Medicated and Controlled,  Endo/Other  negative endocrine ROS  Renal/GU negative Renal ROS  negative genitourinary   Musculoskeletal   Abdominal   Peds  Hematology negative hematology ROS (+)   Anesthesia Other Findings Past Medical History: 10/24/2014: Arterial vascular disease     Comment:  Overview:  MI 1994, small-vessel disease requiring               coumadin  No date: Asthma 10/24/2014: Centrilobular emphysema ([facility])     Comment:  Overview:  2L O2 prn  No date: Centrilobular emphysema ([place]) 11/06/2015: Chronic obstructive pulmonary disease ([facility]) No date: Coronary artery disease No date: GERD (gastroesophageal reflux disease) No date: Glaucoma 2/17, 1994: Heart attack ([place]) No date: Heart disease No date: Hyperlipemia No date: Hypertension 10/24/2014: Restless leg No date: Sleep apnea  Past Surgical History: No date: CATARACT EXTRACTION W/ INTRAOCULAR LENS  IMPLANT, BILATERAL No date: EYE SURGERY  BMI    Body Mass Index:  32.60 kg/m      Reproductive/Obstetrics negative OB ROS                             Anesthesia Physical Anesthesia Plan  ASA: III  Anesthesia  Plan: General   Post-op Pain Management:    Induction: Intravenous  PONV Risk Score and Plan: Propofol infusion and TIVA  Airway Management Planned: Natural Airway and Nasal Cannula  Additional Equipment:   Intra-op Plan:   Post-operative Plan:   Informed Consent: I have reviewed the patients History and Physical, chart, labs and discussed the procedure including the risks, benefits and alternatives for the proposed anesthesia with the patient or authorized representative who has indicated his/her understanding and acceptance.   Dental Advisory Given  Plan Discussed with: Anesthesiologist, CRNA and Surgeon  Anesthesia Plan Comments: (Patient consented for risks of anesthesia including but not limited to:  - adverse reactions to medications - risk of intubation if required - damage to teeth, lips or other oral mucosa - sore throat or hoarseness - Damage to heart, brain, lungs or loss of life  Patient voiced understanding.)        Anesthesia Quick Evaluation

## 2018-02-19 NOTE — Op Note (Signed)
[facility] Gastroenterology Patient Name: Bradley Bowers Procedure Date: 02/19/2018 8:43 AM MRN: [id number] Account #: [id number] Date of Birth: [birth year] Admit Type: Outpatient Age: 72 Room: [facility] ENDO ROOM 3 Gender: Male Note Status: Finalized Procedure:            Upper GI endoscopy Indications:          Follow-up of Barrett's esophagus Providers:            Lollie Sails, MD Referring MD:         Rusty Aus, MD (Referring MD) Medicines:            Monitored Anesthesia Care Complications:        No immediate complications. Procedure:            Pre-Anesthesia Assessment:                       - ASA Grade Assessment: III - A patient with severe                        systemic disease.                       After obtaining informed consent, the endoscope was                        passed under direct vision. Throughout the procedure,                        the patient's blood pressure, pulse, and oxygen                        saturations were monitored continuously. The                        Colonoscope was introduced through the mouth, and                        advanced to the third part of duodenum. The upper GI                        endoscopy was accomplished without difficulty. The                        patient tolerated the procedure well. Findings:      A non-obstructing Schatzki ring was found at the gastroesophageal       junction.      The Z-line was variable.      There were esophageal mucosal changes suggestive of short-segment       Barrett's esophagus present at the gastroesophageal junction. The       maximum longitudinal extent of these mucosal changes was 0.7 cm in       length. Mucosa was biopsied with a cold forceps for histology in a       targeted manner and in 4 quadrants. One specimen bottle was sent to       pathology.      The exam of the esophagus was otherwise normal.      Diffuse and patchy mild inflammation  characterized by congestion (edema)       and erythema was found in the gastric body and in the gastric antrum.  Biopsies were taken with a cold forceps for histology. Biopsies were       taken with a cold forceps for Helicobacter pylori testing.      A small hiatal hernia was found. The Z-line was a variable distance from       incisors; the hiatal hernia was sliding.      The cardia and gastric fundus were normal on retroflexion ot6herwise.      Patchy mild inflammation characterized by congestion (edema) and       erythema was found in the duodenal bulb.      The exam of the duodenum was otherwise normal. Impression:           - Non-obstructing Schatzki ring.                       - Z-line variable.                       - Esophageal mucosal changes suggestive of                        short-segment Barrett's esophagus. Biopsied.                       - Gastritis. Biopsied.                       - Small hiatal hernia.                       - Duodenitis. Recommendation:       - Continue present medications.                       - Await pathology results.                       - Telephone GI clinic for pathology results in 1 week. Procedure Code(s):    --- Professional ---                       [phone], Esophagogastroduodenoscopy, flexible, transoral;                        with biopsy, single or multiple Diagnosis Code(s):    --- Professional ---                       K22.2, Esophageal obstruction                       K22.8, Other specified diseases of esophagus                       K22.70, Barrett's esophagus without dysplasia                       K29.70, Gastritis, unspecified, without bleeding                       K44.9, Diaphragmatic hernia without obstruction or                        gangrene  K29.80, Duodenitis without bleeding CPT copyright 2017 American Medical Association. All rights reserved. The codes documented in this report are preliminary  and upon coder review may  be revised to meet current compliance requirements. Lollie Sails, MD 02/19/2018 9:05:23 AM This report has been signed electronically. Number of Addenda: 0 Note Initiated On: 02/19/2018 8:43 AM      [facility]

## 2018-02-19 NOTE — Anesthesia Postprocedure Evaluation (Signed)
Anesthesia Post Note  Patient: LEAF KERNODLE  Procedure(s) Performed: COLONOSCOPY WITH PROPOFOL (N/A ) ESOPHAGOGASTRODUODENOSCOPY (EGD) WITH PROPOFOL (N/A )  Patient location during evaluation: Endoscopy Anesthesia Type: General Level of consciousness: awake and alert Pain management: pain level controlled Vital Signs Assessment: post-procedure vital signs reviewed and stable Respiratory status: spontaneous breathing, nonlabored ventilation, respiratory function stable and patient connected to nasal cannula oxygen Cardiovascular status: blood pressure returned to baseline and stable Postop Assessment: no apparent nausea or vomiting Anesthetic complications: no     Last Vitals:  Vitals:   02/19/18 0937 02/19/18 0957  BP: 118/63 128/66  Pulse: 64 (!) 59  Resp: 17 18  Temp:    SpO2: 100% 97%    Last Pain:  Vitals:   02/19/18 0957  TempSrc:   PainSc: 0-No pain                 Precious Haws Adael Culbreath

## 2018-02-20 LAB — SURGICAL PATHOLOGY

## 2018-02-23 ENCOUNTER — Encounter: Payer: Self-pay | Admitting: Gastroenterology

## 2018-04-15 DIAGNOSIS — F429 Obsessive-compulsive disorder, unspecified: Secondary | ICD-10-CM | POA: Diagnosis not present

## 2018-04-15 DIAGNOSIS — F1021 Alcohol dependence, in remission: Secondary | ICD-10-CM | POA: Diagnosis not present

## 2018-04-15 DIAGNOSIS — F5105 Insomnia due to other mental disorder: Secondary | ICD-10-CM | POA: Diagnosis not present

## 2018-04-15 DIAGNOSIS — F3173 Bipolar disorder, in partial remission, most recent episode manic: Secondary | ICD-10-CM | POA: Diagnosis not present

## 2018-04-28 DIAGNOSIS — I2510 Atherosclerotic heart disease of native coronary artery without angina pectoris: Secondary | ICD-10-CM | POA: Diagnosis not present

## 2018-05-05 DIAGNOSIS — Z125 Encounter for screening for malignant neoplasm of prostate: Secondary | ICD-10-CM | POA: Diagnosis not present

## 2018-05-05 DIAGNOSIS — E782 Mixed hyperlipidemia: Secondary | ICD-10-CM | POA: Diagnosis not present

## 2018-05-05 DIAGNOSIS — J432 Centrilobular emphysema: Secondary | ICD-10-CM | POA: Diagnosis not present

## 2018-05-05 DIAGNOSIS — I7789 Other specified disorders of arteries and arterioles: Secondary | ICD-10-CM | POA: Diagnosis not present

## 2018-05-12 DIAGNOSIS — H401131 Primary open-angle glaucoma, bilateral, mild stage: Secondary | ICD-10-CM | POA: Diagnosis not present

## 2018-06-03 DIAGNOSIS — R5383 Other fatigue: Secondary | ICD-10-CM | POA: Diagnosis not present

## 2018-06-12 DIAGNOSIS — J432 Centrilobular emphysema: Secondary | ICD-10-CM | POA: Diagnosis not present

## 2018-06-12 DIAGNOSIS — I252 Old myocardial infarction: Secondary | ICD-10-CM | POA: Diagnosis not present

## 2018-06-12 DIAGNOSIS — I2510 Atherosclerotic heart disease of native coronary artery without angina pectoris: Secondary | ICD-10-CM | POA: Diagnosis not present

## 2018-06-12 DIAGNOSIS — E782 Mixed hyperlipidemia: Secondary | ICD-10-CM | POA: Diagnosis not present

## 2018-07-09 DIAGNOSIS — F3173 Bipolar disorder, in partial remission, most recent episode manic: Secondary | ICD-10-CM | POA: Diagnosis not present

## 2018-07-09 DIAGNOSIS — F1021 Alcohol dependence, in remission: Secondary | ICD-10-CM | POA: Diagnosis not present

## 2018-07-09 DIAGNOSIS — F5105 Insomnia due to other mental disorder: Secondary | ICD-10-CM | POA: Diagnosis not present

## 2018-07-09 DIAGNOSIS — Z79899 Other long term (current) drug therapy: Secondary | ICD-10-CM | POA: Diagnosis not present

## 2018-07-09 DIAGNOSIS — F429 Obsessive-compulsive disorder, unspecified: Secondary | ICD-10-CM | POA: Diagnosis not present

## 2018-09-23 DIAGNOSIS — Z79899 Other long term (current) drug therapy: Secondary | ICD-10-CM | POA: Diagnosis not present

## 2018-09-23 DIAGNOSIS — F3173 Bipolar disorder, in partial remission, most recent episode manic: Secondary | ICD-10-CM | POA: Diagnosis not present

## 2018-10-21 DIAGNOSIS — F1021 Alcohol dependence, in remission: Secondary | ICD-10-CM | POA: Diagnosis not present

## 2018-10-21 DIAGNOSIS — F5105 Insomnia due to other mental disorder: Secondary | ICD-10-CM | POA: Diagnosis not present

## 2018-10-21 DIAGNOSIS — F429 Obsessive-compulsive disorder, unspecified: Secondary | ICD-10-CM | POA: Diagnosis not present

## 2018-10-21 DIAGNOSIS — F3173 Bipolar disorder, in partial remission, most recent episode manic: Secondary | ICD-10-CM | POA: Diagnosis not present

## 2018-10-30 DIAGNOSIS — I7789 Other specified disorders of arteries and arterioles: Secondary | ICD-10-CM | POA: Diagnosis not present

## 2018-10-30 DIAGNOSIS — J432 Centrilobular emphysema: Secondary | ICD-10-CM | POA: Diagnosis not present

## 2018-10-30 DIAGNOSIS — Z125 Encounter for screening for malignant neoplasm of prostate: Secondary | ICD-10-CM | POA: Diagnosis not present

## 2018-10-30 DIAGNOSIS — E782 Mixed hyperlipidemia: Secondary | ICD-10-CM | POA: Diagnosis not present

## 2018-11-06 DIAGNOSIS — Z0000 Encounter for general adult medical examination without abnormal findings: Secondary | ICD-10-CM | POA: Diagnosis not present

## 2018-11-06 DIAGNOSIS — J432 Centrilobular emphysema: Secondary | ICD-10-CM | POA: Diagnosis not present

## 2018-11-06 DIAGNOSIS — E782 Mixed hyperlipidemia: Secondary | ICD-10-CM | POA: Diagnosis not present

## 2018-11-06 DIAGNOSIS — I7789 Other specified disorders of arteries and arterioles: Secondary | ICD-10-CM | POA: Diagnosis not present

## 2018-11-10 DIAGNOSIS — H401131 Primary open-angle glaucoma, bilateral, mild stage: Secondary | ICD-10-CM | POA: Diagnosis not present

## 2018-11-17 DIAGNOSIS — H401131 Primary open-angle glaucoma, bilateral, mild stage: Secondary | ICD-10-CM | POA: Diagnosis not present

## 2018-12-11 DIAGNOSIS — I2510 Atherosclerotic heart disease of native coronary artery without angina pectoris: Secondary | ICD-10-CM | POA: Diagnosis not present

## 2018-12-11 DIAGNOSIS — J432 Centrilobular emphysema: Secondary | ICD-10-CM | POA: Diagnosis not present

## 2019-01-20 DIAGNOSIS — F5105 Insomnia due to other mental disorder: Secondary | ICD-10-CM | POA: Diagnosis not present

## 2019-01-20 DIAGNOSIS — F1021 Alcohol dependence, in remission: Secondary | ICD-10-CM | POA: Diagnosis not present

## 2019-01-20 DIAGNOSIS — F429 Obsessive-compulsive disorder, unspecified: Secondary | ICD-10-CM | POA: Diagnosis not present

## 2019-01-20 DIAGNOSIS — F3173 Bipolar disorder, in partial remission, most recent episode manic: Secondary | ICD-10-CM | POA: Diagnosis not present

## 2019-05-05 DIAGNOSIS — E782 Mixed hyperlipidemia: Secondary | ICD-10-CM | POA: Diagnosis not present

## 2019-05-12 DIAGNOSIS — R739 Hyperglycemia, unspecified: Secondary | ICD-10-CM | POA: Diagnosis not present

## 2019-05-12 DIAGNOSIS — E782 Mixed hyperlipidemia: Secondary | ICD-10-CM | POA: Diagnosis not present

## 2019-05-12 DIAGNOSIS — J432 Centrilobular emphysema: Secondary | ICD-10-CM | POA: Diagnosis not present

## 2019-05-12 DIAGNOSIS — Z125 Encounter for screening for malignant neoplasm of prostate: Secondary | ICD-10-CM | POA: Diagnosis not present

## 2019-05-13 DIAGNOSIS — F429 Obsessive-compulsive disorder, unspecified: Secondary | ICD-10-CM | POA: Diagnosis not present

## 2019-05-13 DIAGNOSIS — F1021 Alcohol dependence, in remission: Secondary | ICD-10-CM | POA: Diagnosis not present

## 2019-05-13 DIAGNOSIS — F5105 Insomnia due to other mental disorder: Secondary | ICD-10-CM | POA: Diagnosis not present

## 2019-05-13 DIAGNOSIS — F3173 Bipolar disorder, in partial remission, most recent episode manic: Secondary | ICD-10-CM | POA: Diagnosis not present

## 2019-05-17 DIAGNOSIS — H401131 Primary open-angle glaucoma, bilateral, mild stage: Secondary | ICD-10-CM | POA: Diagnosis not present

## 2019-06-17 DIAGNOSIS — I2510 Atherosclerotic heart disease of native coronary artery without angina pectoris: Secondary | ICD-10-CM | POA: Diagnosis not present

## 2019-06-17 DIAGNOSIS — E782 Mixed hyperlipidemia: Secondary | ICD-10-CM | POA: Diagnosis not present

## 2019-06-17 DIAGNOSIS — I252 Old myocardial infarction: Secondary | ICD-10-CM | POA: Diagnosis not present

## 2019-06-17 DIAGNOSIS — J432 Centrilobular emphysema: Secondary | ICD-10-CM | POA: Diagnosis not present

## 2019-07-21 DIAGNOSIS — Z23 Encounter for immunization: Secondary | ICD-10-CM | POA: Diagnosis not present

## 2019-09-06 DIAGNOSIS — F3173 Bipolar disorder, in partial remission, most recent episode manic: Secondary | ICD-10-CM | POA: Diagnosis not present

## 2019-09-06 DIAGNOSIS — F5105 Insomnia due to other mental disorder: Secondary | ICD-10-CM | POA: Diagnosis not present

## 2019-09-06 DIAGNOSIS — F429 Obsessive-compulsive disorder, unspecified: Secondary | ICD-10-CM | POA: Diagnosis not present

## 2019-09-06 DIAGNOSIS — F1021 Alcohol dependence, in remission: Secondary | ICD-10-CM | POA: Diagnosis not present

## 2019-10-25 DIAGNOSIS — F429 Obsessive-compulsive disorder, unspecified: Secondary | ICD-10-CM | POA: Diagnosis not present

## 2019-10-25 DIAGNOSIS — F3173 Bipolar disorder, in partial remission, most recent episode manic: Secondary | ICD-10-CM | POA: Diagnosis not present

## 2019-10-26 DIAGNOSIS — F1021 Alcohol dependence, in remission: Secondary | ICD-10-CM | POA: Diagnosis not present

## 2019-10-26 DIAGNOSIS — F429 Obsessive-compulsive disorder, unspecified: Secondary | ICD-10-CM | POA: Diagnosis not present

## 2019-10-26 DIAGNOSIS — F5105 Insomnia due to other mental disorder: Secondary | ICD-10-CM | POA: Diagnosis not present

## 2019-10-26 DIAGNOSIS — F3111 Bipolar disorder, current episode manic without psychotic features, mild: Secondary | ICD-10-CM | POA: Diagnosis not present

## 2019-11-03 DIAGNOSIS — F1021 Alcohol dependence, in remission: Secondary | ICD-10-CM | POA: Diagnosis not present

## 2019-11-03 DIAGNOSIS — F3111 Bipolar disorder, current episode manic without psychotic features, mild: Secondary | ICD-10-CM | POA: Diagnosis not present

## 2019-11-03 DIAGNOSIS — F5105 Insomnia due to other mental disorder: Secondary | ICD-10-CM | POA: Diagnosis not present

## 2019-11-03 DIAGNOSIS — Z79899 Other long term (current) drug therapy: Secondary | ICD-10-CM | POA: Diagnosis not present

## 2019-11-03 DIAGNOSIS — F429 Obsessive-compulsive disorder, unspecified: Secondary | ICD-10-CM | POA: Diagnosis not present

## 2019-11-03 DIAGNOSIS — F3173 Bipolar disorder, in partial remission, most recent episode manic: Secondary | ICD-10-CM | POA: Diagnosis not present

## 2019-11-12 DIAGNOSIS — E782 Mixed hyperlipidemia: Secondary | ICD-10-CM | POA: Diagnosis not present

## 2019-11-12 DIAGNOSIS — R739 Hyperglycemia, unspecified: Secondary | ICD-10-CM | POA: Diagnosis not present

## 2019-11-12 DIAGNOSIS — Z125 Encounter for screening for malignant neoplasm of prostate: Secondary | ICD-10-CM | POA: Diagnosis not present

## 2019-11-15 DIAGNOSIS — F3111 Bipolar disorder, current episode manic without psychotic features, mild: Secondary | ICD-10-CM | POA: Diagnosis not present

## 2019-11-15 DIAGNOSIS — F5105 Insomnia due to other mental disorder: Secondary | ICD-10-CM | POA: Diagnosis not present

## 2019-11-15 DIAGNOSIS — F429 Obsessive-compulsive disorder, unspecified: Secondary | ICD-10-CM | POA: Diagnosis not present

## 2019-11-15 DIAGNOSIS — F1021 Alcohol dependence, in remission: Secondary | ICD-10-CM | POA: Diagnosis not present

## 2019-11-16 DIAGNOSIS — H401131 Primary open-angle glaucoma, bilateral, mild stage: Secondary | ICD-10-CM | POA: Diagnosis not present

## 2019-11-19 DIAGNOSIS — L570 Actinic keratosis: Secondary | ICD-10-CM | POA: Diagnosis not present

## 2019-11-19 DIAGNOSIS — E782 Mixed hyperlipidemia: Secondary | ICD-10-CM | POA: Diagnosis not present

## 2019-11-19 DIAGNOSIS — J432 Centrilobular emphysema: Secondary | ICD-10-CM | POA: Diagnosis not present

## 2019-11-19 DIAGNOSIS — I7789 Other specified disorders of arteries and arterioles: Secondary | ICD-10-CM | POA: Diagnosis not present

## 2019-11-19 DIAGNOSIS — Z0000 Encounter for general adult medical examination without abnormal findings: Secondary | ICD-10-CM | POA: Diagnosis not present

## 2019-11-23 DIAGNOSIS — H401131 Primary open-angle glaucoma, bilateral, mild stage: Secondary | ICD-10-CM | POA: Diagnosis not present

## 2019-11-30 DIAGNOSIS — F1021 Alcohol dependence, in remission: Secondary | ICD-10-CM | POA: Diagnosis not present

## 2019-11-30 DIAGNOSIS — F3111 Bipolar disorder, current episode manic without psychotic features, mild: Secondary | ICD-10-CM | POA: Diagnosis not present

## 2019-11-30 DIAGNOSIS — F5105 Insomnia due to other mental disorder: Secondary | ICD-10-CM | POA: Diagnosis not present

## 2019-11-30 DIAGNOSIS — F429 Obsessive-compulsive disorder, unspecified: Secondary | ICD-10-CM | POA: Diagnosis not present

## 2019-12-22 DIAGNOSIS — F429 Obsessive-compulsive disorder, unspecified: Secondary | ICD-10-CM | POA: Diagnosis not present

## 2019-12-22 DIAGNOSIS — F1021 Alcohol dependence, in remission: Secondary | ICD-10-CM | POA: Diagnosis not present

## 2019-12-22 DIAGNOSIS — F3111 Bipolar disorder, current episode manic without psychotic features, mild: Secondary | ICD-10-CM | POA: Diagnosis not present

## 2019-12-22 DIAGNOSIS — F5105 Insomnia due to other mental disorder: Secondary | ICD-10-CM | POA: Diagnosis not present

## 2019-12-23 DIAGNOSIS — I7789 Other specified disorders of arteries and arterioles: Secondary | ICD-10-CM | POA: Diagnosis not present

## 2019-12-23 DIAGNOSIS — E782 Mixed hyperlipidemia: Secondary | ICD-10-CM | POA: Diagnosis not present

## 2019-12-23 DIAGNOSIS — J432 Centrilobular emphysema: Secondary | ICD-10-CM | POA: Diagnosis not present

## 2019-12-23 DIAGNOSIS — I252 Old myocardial infarction: Secondary | ICD-10-CM | POA: Diagnosis not present

## 2019-12-23 DIAGNOSIS — I2510 Atherosclerotic heart disease of native coronary artery without angina pectoris: Secondary | ICD-10-CM | POA: Diagnosis not present

## 2020-01-17 DIAGNOSIS — L570 Actinic keratosis: Secondary | ICD-10-CM | POA: Diagnosis not present

## 2020-01-17 DIAGNOSIS — J432 Centrilobular emphysema: Secondary | ICD-10-CM | POA: Diagnosis not present

## 2020-02-21 DIAGNOSIS — F429 Obsessive-compulsive disorder, unspecified: Secondary | ICD-10-CM | POA: Diagnosis not present

## 2020-02-21 DIAGNOSIS — F1021 Alcohol dependence, in remission: Secondary | ICD-10-CM | POA: Diagnosis not present

## 2020-02-21 DIAGNOSIS — F5105 Insomnia due to other mental disorder: Secondary | ICD-10-CM | POA: Diagnosis not present

## 2020-02-21 DIAGNOSIS — F3111 Bipolar disorder, current episode manic without psychotic features, mild: Secondary | ICD-10-CM | POA: Diagnosis not present

## 2020-05-11 DIAGNOSIS — E782 Mixed hyperlipidemia: Secondary | ICD-10-CM | POA: Diagnosis not present

## 2020-05-17 DIAGNOSIS — J432 Centrilobular emphysema: Secondary | ICD-10-CM | POA: Diagnosis not present

## 2020-05-17 DIAGNOSIS — F330 Major depressive disorder, recurrent, mild: Secondary | ICD-10-CM | POA: Diagnosis not present

## 2020-05-17 DIAGNOSIS — I2510 Atherosclerotic heart disease of native coronary artery without angina pectoris: Secondary | ICD-10-CM | POA: Diagnosis not present

## 2020-05-17 DIAGNOSIS — Z125 Encounter for screening for malignant neoplasm of prostate: Secondary | ICD-10-CM | POA: Diagnosis not present

## 2020-05-17 DIAGNOSIS — E782 Mixed hyperlipidemia: Secondary | ICD-10-CM | POA: Diagnosis not present

## 2020-05-22 DIAGNOSIS — F5105 Insomnia due to other mental disorder: Secondary | ICD-10-CM | POA: Diagnosis not present

## 2020-05-22 DIAGNOSIS — F3111 Bipolar disorder, current episode manic without psychotic features, mild: Secondary | ICD-10-CM | POA: Diagnosis not present

## 2020-05-22 DIAGNOSIS — F1021 Alcohol dependence, in remission: Secondary | ICD-10-CM | POA: Diagnosis not present

## 2020-05-22 DIAGNOSIS — F429 Obsessive-compulsive disorder, unspecified: Secondary | ICD-10-CM | POA: Diagnosis not present

## 2020-06-27 DIAGNOSIS — H401131 Primary open-angle glaucoma, bilateral, mild stage: Secondary | ICD-10-CM | POA: Diagnosis not present

## 2020-06-29 DIAGNOSIS — J432 Centrilobular emphysema: Secondary | ICD-10-CM | POA: Diagnosis not present

## 2020-06-29 DIAGNOSIS — I2510 Atherosclerotic heart disease of native coronary artery without angina pectoris: Secondary | ICD-10-CM | POA: Diagnosis not present

## 2020-06-29 DIAGNOSIS — I252 Old myocardial infarction: Secondary | ICD-10-CM | POA: Diagnosis not present

## 2020-06-29 DIAGNOSIS — E782 Mixed hyperlipidemia: Secondary | ICD-10-CM | POA: Diagnosis not present

## 2020-06-29 DIAGNOSIS — Z23 Encounter for immunization: Secondary | ICD-10-CM | POA: Diagnosis not present

## 2020-08-15 DIAGNOSIS — L820 Inflamed seborrheic keratosis: Secondary | ICD-10-CM | POA: Diagnosis not present

## 2020-08-15 DIAGNOSIS — Z23 Encounter for immunization: Secondary | ICD-10-CM | POA: Diagnosis not present

## 2020-09-20 DIAGNOSIS — F1021 Alcohol dependence, in remission: Secondary | ICD-10-CM | POA: Diagnosis not present

## 2020-09-20 DIAGNOSIS — F5105 Insomnia due to other mental disorder: Secondary | ICD-10-CM | POA: Diagnosis not present

## 2020-09-20 DIAGNOSIS — F429 Obsessive-compulsive disorder, unspecified: Secondary | ICD-10-CM | POA: Diagnosis not present

## 2020-09-20 DIAGNOSIS — F3111 Bipolar disorder, current episode manic without psychotic features, mild: Secondary | ICD-10-CM | POA: Diagnosis not present

## 2020-11-17 DIAGNOSIS — Z125 Encounter for screening for malignant neoplasm of prostate: Secondary | ICD-10-CM | POA: Diagnosis not present

## 2020-11-17 DIAGNOSIS — E782 Mixed hyperlipidemia: Secondary | ICD-10-CM | POA: Diagnosis not present

## 2020-11-22 DIAGNOSIS — Z0000 Encounter for general adult medical examination without abnormal findings: Secondary | ICD-10-CM | POA: Diagnosis not present

## 2020-11-22 DIAGNOSIS — J449 Chronic obstructive pulmonary disease, unspecified: Secondary | ICD-10-CM | POA: Diagnosis not present

## 2020-11-22 DIAGNOSIS — F32A Depression, unspecified: Secondary | ICD-10-CM | POA: Diagnosis not present

## 2020-11-22 DIAGNOSIS — I7789 Other specified disorders of arteries and arterioles: Secondary | ICD-10-CM | POA: Diagnosis not present

## 2020-12-19 DIAGNOSIS — H401131 Primary open-angle glaucoma, bilateral, mild stage: Secondary | ICD-10-CM | POA: Diagnosis not present

## 2020-12-28 DIAGNOSIS — H401131 Primary open-angle glaucoma, bilateral, mild stage: Secondary | ICD-10-CM | POA: Diagnosis not present

## 2021-01-12 DIAGNOSIS — F319 Bipolar disorder, unspecified: Secondary | ICD-10-CM | POA: Diagnosis not present

## 2021-01-12 DIAGNOSIS — L820 Inflamed seborrheic keratosis: Secondary | ICD-10-CM | POA: Diagnosis not present

## 2021-02-28 DIAGNOSIS — L821 Other seborrheic keratosis: Secondary | ICD-10-CM | POA: Diagnosis not present

## 2021-02-28 DIAGNOSIS — J432 Centrilobular emphysema: Secondary | ICD-10-CM | POA: Diagnosis not present

## 2021-02-28 DIAGNOSIS — J449 Chronic obstructive pulmonary disease, unspecified: Secondary | ICD-10-CM | POA: Diagnosis not present

## 2021-02-28 DIAGNOSIS — R0600 Dyspnea, unspecified: Secondary | ICD-10-CM | POA: Diagnosis not present

## 2021-03-26 ENCOUNTER — Other Ambulatory Visit: Payer: Self-pay

## 2021-03-26 ENCOUNTER — Encounter: Payer: Medicare HMO | Attending: Internal Medicine

## 2021-03-26 DIAGNOSIS — J449 Chronic obstructive pulmonary disease, unspecified: Secondary | ICD-10-CM | POA: Insufficient documentation

## 2021-03-26 NOTE — Progress Notes (Signed)
Virtual Visit completed. Patient informed on EP and RD appointment and 6 Minute walk test. Patient also informed of patient health questionnaires on My Chart. Patient Verbalizes understanding. Visit diagnosis can be found in [facility] 02/28/2021.

## 2021-04-05 ENCOUNTER — Other Ambulatory Visit: Payer: Self-pay

## 2021-04-05 VITALS — Ht 66.75 in | Wt 208.0 lb

## 2021-04-05 DIAGNOSIS — J449 Chronic obstructive pulmonary disease, unspecified: Secondary | ICD-10-CM

## 2021-04-05 NOTE — Patient Instructions (Signed)
Patient Instructions  Patient Details  Name: Bradley Bowers MRN: [id number] Date of Birth: [birth year] Referring Provider:  Rusty Aus, MD  Below are your personal goals for exercise, nutrition, and risk factors. Our goal is to help you stay on track towards obtaining and maintaining these goals. We will be discussing your progress on these goals with you throughout the program.  Initial Exercise Prescription:  Initial Exercise Prescription - 04/05/21 0900       Date of Initial Exercise RX and Referring Provider   Date 04/05/21    Referring Provider Emily Filbert MD      Treadmill   MPH 1.7    Grade 0    Minutes 15    METs 2.3      REL-XR   Level 1    Speed 50    Minutes 15    METs 1.1      T5 Nustep   Level 1    SPM 80    Minutes 15    METs 1.1      Prescription Details   Frequency (times per week) 2    Duration Progress to 30 minutes of continuous aerobic without signs/symptoms of physical distress      Intensity   THRR 40-80% of Max Heartrate 109-134    Ratings of Perceived Exertion 11-13    Perceived Dyspnea 0-4      Progression   Progression Continue to progress workloads to maintain intensity without signs/symptoms of physical distress.      Resistance Training   Training Prescription Yes    Weight 3 lb    Reps 10-15             Exercise Goals: Frequency: Be able to perform aerobic exercise two to three times per week in program working toward 2-5 days per week of home exercise.  Intensity: Work with a perceived exertion of 11 (fairly light) - 15 (hard) while following your exercise prescription.  We will make changes to your prescription with you as you progress through the program.   Duration: Be able to do 30 to 45 minutes of continuous aerobic exercise in addition to a 5 minute warm-up and a 5 minute cool-down routine.   Nutrition Goals: Your personal nutrition goals will be established when you do your nutrition analysis with the  dietician.  The following are general nutrition guidelines to follow: Cholesterol < 200mg /day Sodium < 1500mg /day Fiber: Men over 50 yrs - 30 grams per day  Personal Goals:  Personal Goals and Risk Factors at Admission - 04/05/21 1000       Core Components/Risk Factors/Patient Goals on Admission    Weight Management Yes;Weight Loss    Intervention Weight Management: Develop a combined nutrition and exercise program designed to reach desired caloric intake, while maintaining appropriate intake of nutrient and fiber, sodium and fats, and appropriate energy expenditure required for the weight goal.;Weight Management: Provide education and appropriate resources to help participant work on and attain dietary goals.;Weight Management/Obesity: Establish reasonable short term and long term weight goals.    Admit Weight 208 lb (94.3 kg)    Goal Weight: Short Term 203 lb (92.1 kg)    Goal Weight: Long Term 195 lb (88.5 kg)    Expected Outcomes Short Term: Continue to assess and modify interventions until short term weight is achieved;Weight Loss: Understanding of general recommendations for a balanced deficit meal plan, which promotes 1-2 lb weight loss per week and includes a negative energy balance of [phone]  kcal/d;Long Term: Adherence to nutrition and physical activity/exercise program aimed toward attainment of established weight goal;Understanding recommendations for meals to include 15-35% energy as protein, 25-35% energy from fat, 35-60% energy from carbohydrates, less than 200mg  of dietary cholesterol, 20-35 gm of total fiber daily;Understanding of distribution of calorie intake throughout the day with the consumption of 4-5 meals/snacks    Improve shortness of breath with ADL's Yes    Intervention Provide education, individualized exercise plan and daily activity instruction to help decrease symptoms of SOB with activities of daily living.    Expected Outcomes Short Term: Improve  cardiorespiratory fitness to achieve a reduction of symptoms when performing ADLs;Long Term: Be able to perform more ADLs without symptoms or delay the onset of symptoms    Hypertension Yes    Intervention Provide education on lifestyle modifcations including regular physical activity/exercise, weight management, moderate sodium restriction and increased consumption of fresh fruit, vegetables, and low fat dairy, alcohol moderation, and smoking cessation.;Monitor prescription use compliance.    Expected Outcomes Short Term: Continued assessment and intervention until BP is < 140/52mm HG in hypertensive participants. < 130/70mm HG in hypertensive participants with diabetes, heart failure or chronic kidney disease.;Long Term: Maintenance of blood pressure at goal levels.    Lipids Yes    Intervention Provide education and support for participant on nutrition & aerobic/resistive exercise along with prescribed medications to achieve LDL 70mg , HDL >40mg .    Expected Outcomes Short Term: Participant states understanding of desired cholesterol values and is compliant with medications prescribed. Participant is following exercise prescription and nutrition guidelines.;Long Term: Cholesterol controlled with medications as prescribed, with individualized exercise RX and with personalized nutrition plan. Value goals: LDL < 70mg , HDL > 40 mg.             Tobacco Use Initial Evaluation: Social History   Tobacco Use  Smoking Status Former   Packs/day: 1.00   Years: 30.00   Pack years: 30.00   Types: Cigarettes   Quit date: 03/07/1994   Years since quitting: 27.0  Smokeless Tobacco Never    Exercise Goals and Review:  Exercise Goals     Row Name 04/05/21 1000             Exercise Goals   Increase Physical Activity Yes       Intervention Provide advice, education, support and counseling about physical activity/exercise needs.;Develop an individualized exercise prescription for aerobic and  resistive training based on initial evaluation findings, risk stratification, comorbidities and participant's personal goals.       Expected Outcomes Short Term: Attend rehab on a regular basis to increase amount of physical activity.;Long Term: Add in home exercise to make exercise part of routine and to increase amount of physical activity.;Long Term: Exercising regularly at least 3-5 days a week.       Increase Strength and Stamina Yes       Intervention Provide advice, education, support and counseling about physical activity/exercise needs.;Develop an individualized exercise prescription for aerobic and resistive training based on initial evaluation findings, risk stratification, comorbidities and participant's personal goals.       Expected Outcomes Short Term: Increase workloads from initial exercise prescription for resistance, speed, and METs.;Short Term: Perform resistance training exercises routinely during rehab and add in resistance training at home;Long Term: Improve cardiorespiratory fitness, muscular endurance and strength as measured by increased METs and functional capacity (6MWT)       Able to understand and use rate of perceived exertion (RPE) scale Yes  Intervention Provide education and explanation on how to use RPE scale       Expected Outcomes Short Term: Able to use RPE daily in rehab to express subjective intensity level;Long Term:  Able to use RPE to guide intensity level when exercising independently       Able to understand and use Dyspnea scale Yes       Intervention Provide education and explanation on how to use Dyspnea scale       Expected Outcomes Short Term: Able to use Dyspnea scale daily in rehab to express subjective sense of shortness of breath during exertion;Long Term: Able to use Dyspnea scale to guide intensity level when exercising independently       Knowledge and understanding of Target Heart Rate Range (THRR) Yes       Intervention Provide education and  explanation of THRR including how the numbers were predicted and where they are located for reference       Expected Outcomes Short Term: Able to state/look up THRR;Short Term: Able to use daily as guideline for intensity in rehab;Long Term: Able to use THRR to govern intensity when exercising independently       Able to check pulse independently Yes       Intervention Provide education and demonstration on how to check pulse in carotid and radial arteries.;Review the importance of being able to check your own pulse for safety during independent exercise       Expected Outcomes Short Term: Able to explain why pulse checking is important during independent exercise;Long Term: Able to check pulse independently and accurately       Understanding of Exercise Prescription Yes       Intervention Provide education, explanation, and written materials on patient's individual exercise prescription       Expected Outcomes Short Term: Able to explain program exercise prescription;Long Term: Able to explain home exercise prescription to exercise independently                Copy of goals given to participant.

## 2021-04-05 NOTE — Progress Notes (Signed)
Pulmonary Individual Treatment Plan  Patient Details  Name: Bradley Bowers MRN: [id number] Date of Birth: 04/27/1946 Referring Provider:   April Manson Pulmonary Rehab from 04/05/2021 in [facility] Cardiac and Pulmonary Rehab  Referring Provider Emily Filbert MD       Initial Encounter Date:  Flowsheet Row Pulmonary Rehab from 04/05/2021 in [facility] Cardiac and Pulmonary Rehab  Date 04/05/21       Visit Diagnosis: Chronic obstructive pulmonary disease, unspecified COPD type ([place])  Patient's Home Medications on Admission:  Current Outpatient Medications:    ADVAIR DISKUS 100-50 MCG/ACT AEPB, , Disp: , Rfl:    albuterol (VENTOLIN HFA) 108 (90 Base) MCG/ACT inhaler, Inhale into the lungs., Disp: , Rfl:    aspirin 81 MG EC tablet, TAKE 1 TABLET (81 MG TOTAL) BY MOUTH ONCE DAILY. (Patient not taking: Reported on 03/26/2021), Disp: , Rfl: 11   aspirin 81 MG EC tablet, Take by mouth., Disp: , Rfl:    atenolol (TENORMIN) 50 MG tablet, TAKE 1 TABLET (50 MG TOTAL) BY MOUTH ONCE DAILY., Disp: , Rfl: 1   atenolol (TENORMIN) 50 MG tablet, Take by mouth. (Patient not taking: Reported on 03/26/2021), Disp: , Rfl:    atorvastatin (LIPITOR) 80 MG tablet, TAKE 1 TABLET (80 MG TOTAL) BY MOUTH ONCE DAILY. (Patient not taking: Reported on 03/26/2021), Disp: , Rfl: 1   atorvastatin (LIPITOR) 80 MG tablet, Take by mouth., Disp: , Rfl:    Calcium-Magnesium-Vitamin D (CITRACAL CALCIUM+D) [id number] MG-MG-UNIT TB24, Take by mouth. (Patient not taking: Reported on 03/26/2021), Disp: , Rfl:    Calcium-Magnesium-Vitamin D 600-40-500 MG-MG-UNIT TB24, Take by mouth., Disp: , Rfl:    carbamazepine (CARBATROL) 200 MG 12 hr capsule, , Disp: , Rfl:    carbamazepine (TEGRETOL) 200 MG tablet, Take 1 tablet by mouth 2 (two) times daily., Disp: , Rfl:    divalproex (DEPAKOTE ER) 500 MG 24 hr tablet, Take 3 tablets (1,500 mg total) by mouth at bedtime., Disp: 90 tablet, Rfl: 1   donepezil (ARICEPT) 5 MG tablet, Take by mouth., Disp:  , Rfl:    fluticasone (FLONASE) 50 MCG/ACT nasal spray, 1 spray by Each Nare route daily. (Patient not taking: Reported on 03/26/2021), Disp: , Rfl:    fluticasone (FLONASE) 50 MCG/ACT nasal spray, Place into the nose. (Patient not taking: Reported on 03/26/2021), Disp: , Rfl:    fluticasone furoate-vilanterol (BREO ELLIPTA) 100-25 MCG/INH AEPB, Inhale into the lungs., Disp: , Rfl:    Fluticasone-Salmeterol (ADVAIR) 250-50 MCG/DOSE AEPB, Inhale into the lungs., Disp: , Rfl:    gabapentin (NEURONTIN) 300 MG capsule, Take by mouth., Disp: , Rfl:    lisinopril (PRINIVIL,ZESTRIL) 5 MG tablet, TAKE 1 TABLET (5 MG TOTAL) BY MOUTH ONCE DAILY. (Patient not taking: Reported on 03/26/2021), Disp: , Rfl: 11   lisinopril (ZESTRIL) 5 MG tablet, Take 1 tablet by mouth daily., Disp: , Rfl:    methocarbamol (ROBAXIN) 500 MG tablet, Take 500 mg by mouth 3 (three) times daily as needed., Disp: , Rfl: 1   methocarbamol (ROBAXIN) 500 MG tablet, Take by mouth., Disp: , Rfl:    montelukast (SINGULAIR) 10 MG tablet, Take 10 mg by mouth at bedtime., Disp: , Rfl:    OLANZapine (ZYPREXA) 10 MG tablet, Take 1 tablet (10 mg total) by mouth at bedtime., Disp: 30 tablet, Rfl: 1   OLANZapine (ZYPREXA) 5 MG tablet, Take by mouth., Disp: , Rfl:    omeprazole (PRILOSEC) 40 MG capsule, TAKE 1 CAPSULE (40 MG TOTAL) BY MOUTH 2 (TWO) TIMES  DAILY. (Patient not taking: Reported on 03/26/2021), Disp: , Rfl: 3   omeprazole (PRILOSEC) 40 MG capsule, Take 1 capsule by mouth 2 (two) times daily., Disp: , Rfl:    ticagrelor (BRILINTA) 90 MG TABS tablet, Take by mouth., Disp: , Rfl:    timolol (TIMOPTIC) 0.5 % ophthalmic solution, USE 1 DROP(S) IN BOTH EYES ONCE A DAY, Disp: , Rfl: 5   tiotropium (SPIRIVA) 18 MCG inhalation capsule, Place into inhaler and inhale. (Patient not taking: Reported on 03/26/2021), Disp: , Rfl:    tiotropium (SPIRIVA) 18 MCG inhalation capsule, Place into inhaler and inhale., Disp: , Rfl:    Travoprost, BAK Free,  (TRAVATAN) 0.004 % SOLN ophthalmic solution, Frequency:PHARMDIR   Dosage:0.0     Instructions:  Note:1 gtt in each eye q pm Dose: 1, Disp: , Rfl:    vitamin B-12 1000 MCG tablet, Take 1 tablet (1,000 mcg total) by mouth daily., Disp: 30 tablet, Rfl: 1  Past Medical History: Past Medical History:  Diagnosis Date   Arterial vascular disease 10/24/2014   Overview:  MI 1994, small-vessel disease requiring coumadin    Asthma    Centrilobular emphysema ([place]) 10/24/2014   Overview:  2L O2 prn    Centrilobular emphysema ([facility])    Chronic obstructive pulmonary disease ([place]) 11/06/2015   Coronary artery disease    GERD (gastroesophageal reflux disease)    Glaucoma    Heart attack ([place]) 2/17, 1994   Heart disease    Hyperlipemia    Hypertension    Restless leg 10/24/2014   Sleep apnea     Tobacco Use: Social History   Tobacco Use  Smoking Status Former   Packs/day: 1.00   Years: 30.00   Pack years: 30.00   Types: Cigarettes   Quit date: 03/07/1994   Years since quitting: 27.0  Smokeless Tobacco Never    Labs: Recent Review Scientist, physiological     Labs for ITP Cardiac and Pulmonary Rehab Latest Ref Rng & Units 09/24/2016   Cholestrol 0 - 200 mg/dL 177   LDLCALC 0 - 99 mg/dL 111(H)   HDL >40 mg/dL 41   Trlycerides <150 mg/dL 126   Hemoglobin A1c 4.8 - 5.6 % 5.2        Pulmonary Assessment Scores:  Pulmonary Assessment Scores     Row Name 04/05/21 0945         ADL UCSD   ADL Phase Entry     SOB Score total 19     Rest 0     Walk 2     Stairs 3     Bath 0     Dress 1     Shop 0           CAT Score     CAT Score 16           mMRC Score     mMRC Score 2             UCSD: Self-administered rating of dyspnea associated with activities of daily living (ADLs) 6-point scale (0 = "not at all" to 5 = "maximal or unable to do because of breathlessness")  Scoring Scores range from 0 to 120.  Minimally important difference is 5 units  CAT: CAT can identify the health  impairment of COPD patients and is better correlated with disease progression.  CAT has a scoring range of zero to 40. The CAT score is classified into four groups of low (less than 10), medium (10 - 20),  high (21-30) and very high (31-40) based on the impact level of disease on health status. A CAT score over 10 suggests significant symptoms.  A worsening CAT score could be explained by an exacerbation, poor medication adherence, poor inhaler technique, or progression of COPD or comorbid conditions.  CAT MCID is 2 points  mMRC: mMRC (Modified Medical Research Council) Dyspnea Scale is used to assess the degree of baseline functional disability in patients of respiratory disease due to dyspnea. No minimal important difference is established. A decrease in score of 1 point or greater is considered a positive change.   Pulmonary Function Assessment:  Pulmonary Function Assessment - 03/26/21 1403       Breath   Shortness of Breath Yes;Limiting activity             Exercise Target Goals: Exercise Program Goal: Individual exercise prescription set using results from initial 6 min walk test and THRR while considering  patient's activity barriers and safety.   Exercise Prescription Goal: Initial exercise prescription builds to 30-45 minutes a day of aerobic activity, 2-3 days per week.  Home exercise guidelines will be given to patient during program as part of exercise prescription that the participant will acknowledge.  Education: Aerobic Exercise: - Group verbal and visual presentation on the components of exercise prescription. Introduces F.I.T.T principle from ACSM for exercise prescriptions.  Reviews F.I.T.T. principles of aerobic exercise including progression. Written material given at graduation.   Education: Resistance Exercise: - Group verbal and visual presentation on the components of exercise prescription. Introduces F.I.T.T principle from ACSM for exercise prescriptions   Reviews F.I.T.T. principles of resistance exercise including progression. Written material given at graduation.    Education: Exercise & Equipment Safety: - Individual verbal instruction and demonstration of equipment use and safety with use of the equipment. Flowsheet Row Pulmonary Rehab from 03/26/2021 in [facility] Cardiac and Pulmonary Rehab  Date 03/26/21  Educator [facility]  Instruction Review Code 1- Verbalizes Understanding       Education: Exercise Physiology & General Exercise Guidelines: - Group verbal and written instruction with models to review the exercise physiology of the cardiovascular system and associated critical values. Provides general exercise guidelines with specific guidelines to those with heart or lung disease.    Education: Flexibility, Balance, Mind/Body Relaxation: - Group verbal and visual presentation with interactive activity on the components of exercise prescription. Introduces F.I.T.T principle from ACSM for exercise prescriptions. Reviews F.I.T.T. principles of flexibility and balance exercise training including progression. Also discusses the mind body connection.  Reviews various relaxation techniques to help reduce and manage stress (i.e. Deep breathing, progressive muscle relaxation, and visualization). Balance handout provided to take home. Written material given at graduation.   Activity Barriers & Risk Stratification:  Activity Barriers & Cardiac Risk Stratification - 04/05/21 0944       Activity Barriers & Cardiac Risk Stratification   Activity Barriers Shortness of Breath;Deconditioning;Muscular Weakness             6 Minute Walk:  6 Minute Walk     Row Name 04/05/21 0953         6 Minute Walk   Phase Initial     Distance 400 feet     Walk Time 2 minutes  Stopped early due to low O2 sats     # of Rest Breaks 0     MPH 2.27     METS 1.13     RPE 15     Perceived Dyspnea  2  VO2 Peak 3.96     Symptoms Yes (comment)     Comments SOB      Resting HR 84 bpm     Resting BP 132/72     Resting Oxygen Saturation  94 %     Exercise Oxygen Saturation  during 6 min walk 80 %  Stopped early     Max Ex. HR 104 bpm     Max Ex. BP 162/70     2 Minute Post BP 140/70           Interval HR     1 Minute HR 94     2 Minute HR 104     3 Minute HR --  Test stopped early     4 Minute HR --  Test stopped early     5 Minute HR --  Test stopped early     6 Minute HR --  Test stopped early     2 Minute Post HR 88     Interval Heart Rate? Yes           Interval Oxygen     Interval Oxygen? Yes     Baseline Oxygen Saturation % 94 %     1 Minute Oxygen Saturation % 87 %     1 Minute Liters of Oxygen 0 L  RA     2 Minute Oxygen Saturation % 80 %  Test stopped     2 Minute Liters of Oxygen 0 L     3 Minute Liters of Oxygen 0 L     4 Minute Liters of Oxygen 0 L     5 Minute Liters of Oxygen 0 L     6 Minute Liters of Oxygen 0 L     2 Minute Post Oxygen Saturation % 90 %     2 Minute Post Liters of Oxygen 0 L            Oxygen Initial Assessment:  Oxygen Initial Assessment - 04/05/21 0945       Home Oxygen   Home Oxygen Device None    Sleep Oxygen Prescription None    Home Exercise Oxygen Prescription None    Home Resting Oxygen Prescription None    Compliance with Home Oxygen Use Yes      Initial 6 min Walk   Oxygen Used None      Program Oxygen Prescription   Program Oxygen Prescription None      Intervention   Short Term Goals To learn and understand importance of monitoring SPO2 with pulse oximeter and demonstrate accurate use of the pulse oximeter.;To learn and exhibit compliance with exercise, home and travel O2 prescription;To learn and understand importance of maintaining oxygen saturations>88%;To learn and demonstrate proper pursed lip breathing techniques or other breathing techniques. ;To learn and demonstrate proper use of respiratory medications    Long  Term Goals Exhibits compliance with exercise, home   and travel O2 prescription;Maintenance of O2 saturations>88%;Verbalizes importance of monitoring SPO2 with pulse oximeter and return demonstration;Exhibits proper breathing techniques, such as pursed lip breathing or other method taught during program session;Compliance with respiratory medication;Demonstrates proper use of MDI's             Oxygen Re-Evaluation:   Oxygen Discharge (Final Oxygen Re-Evaluation):   Initial Exercise Prescription:  Initial Exercise Prescription - 04/05/21 0900       Date of Initial Exercise RX and Referring Provider   Date 04/05/21    Referring Provider Emily Filbert  MD      Treadmill   MPH 1.7    Grade 0    Minutes 15    METs 2.3      REL-XR   Level 1    Speed 50    Minutes 15    METs 1.1      T5 Nustep   Level 1    SPM 80    Minutes 15    METs 1.1      Prescription Details   Frequency (times per week) 2    Duration Progress to 30 minutes of continuous aerobic without signs/symptoms of physical distress      Intensity   THRR 40-80% of Max Heartrate 109-134    Ratings of Perceived Exertion 11-13    Perceived Dyspnea 0-4      Progression   Progression Continue to progress workloads to maintain intensity without signs/symptoms of physical distress.      Resistance Training   Training Prescription Yes    Weight 3 lb    Reps 10-15             Perform Capillary Blood Glucose checks as needed.  Exercise Prescription Changes:   Exercise Prescription Changes     Row Name 04/05/21 0900             Response to Exercise   Blood Pressure (Admit) 132/72       Blood Pressure (Exercise) 162/70       Blood Pressure (Exit) 140/70       Heart Rate (Admit) 84 bpm       Heart Rate (Exercise) 104 bpm       Heart Rate (Exit) 88 bpm       Oxygen Saturation (Admit) 94 %       Oxygen Saturation (Exercise) 80 %       Oxygen Saturation (Exit) 94 %       Rating of Perceived Exertion (Exercise) 15       Perceived Dyspnea (Exercise)  2       Symptoms SOB       Comments walk test results                Exercise Comments:   Exercise Goals and Review:   Exercise Goals     Row Name 04/05/21 1000             Exercise Goals   Increase Physical Activity Yes       Intervention Provide advice, education, support and counseling about physical activity/exercise needs.;Develop an individualized exercise prescription for aerobic and resistive training based on initial evaluation findings, risk stratification, comorbidities and participant's personal goals.       Expected Outcomes Short Term: Attend rehab on a regular basis to increase amount of physical activity.;Long Term: Add in home exercise to make exercise part of routine and to increase amount of physical activity.;Long Term: Exercising regularly at least 3-5 days a week.       Increase Strength and Stamina Yes       Intervention Provide advice, education, support and counseling about physical activity/exercise needs.;Develop an individualized exercise prescription for aerobic and resistive training based on initial evaluation findings, risk stratification, comorbidities and participant's personal goals.       Expected Outcomes Short Term: Increase workloads from initial exercise prescription for resistance, speed, and METs.;Short Term: Perform resistance training exercises routinely during rehab and add in resistance training at home;Long Term: Improve cardiorespiratory fitness, muscular endurance and strength as measured  by increased METs and functional capacity (6MWT)       Able to understand and use rate of perceived exertion (RPE) scale Yes       Intervention Provide education and explanation on how to use RPE scale       Expected Outcomes Short Term: Able to use RPE daily in rehab to express subjective intensity level;Long Term:  Able to use RPE to guide intensity level when exercising independently       Able to understand and use Dyspnea scale Yes        Intervention Provide education and explanation on how to use Dyspnea scale       Expected Outcomes Short Term: Able to use Dyspnea scale daily in rehab to express subjective sense of shortness of breath during exertion;Long Term: Able to use Dyspnea scale to guide intensity level when exercising independently       Knowledge and understanding of Target Heart Rate Range (THRR) Yes       Intervention Provide education and explanation of THRR including how the numbers were predicted and where they are located for reference       Expected Outcomes Short Term: Able to state/look up THRR;Short Term: Able to use daily as guideline for intensity in rehab;Long Term: Able to use THRR to govern intensity when exercising independently       Able to check pulse independently Yes       Intervention Provide education and demonstration on how to check pulse in carotid and radial arteries.;Review the importance of being able to check your own pulse for safety during independent exercise       Expected Outcomes Short Term: Able to explain why pulse checking is important during independent exercise;Long Term: Able to check pulse independently and accurately       Understanding of Exercise Prescription Yes       Intervention Provide education, explanation, and written materials on patient's individual exercise prescription       Expected Outcomes Short Term: Able to explain program exercise prescription;Long Term: Able to explain home exercise prescription to exercise independently                Exercise Goals Re-Evaluation :   Discharge Exercise Prescription (Final Exercise Prescription Changes):  Exercise Prescription Changes - 04/05/21 0900       Response to Exercise   Blood Pressure (Admit) 132/72    Blood Pressure (Exercise) 162/70    Blood Pressure (Exit) 140/70    Heart Rate (Admit) 84 bpm    Heart Rate (Exercise) 104 bpm    Heart Rate (Exit) 88 bpm    Oxygen Saturation (Admit) 94 %    Oxygen  Saturation (Exercise) 80 %    Oxygen Saturation (Exit) 94 %    Rating of Perceived Exertion (Exercise) 15    Perceived Dyspnea (Exercise) 2    Symptoms SOB    Comments walk test results             Nutrition:  Target Goals: Understanding of nutrition guidelines, daily intake of sodium 1500mg , cholesterol 200mg , calories 30% from fat and 7% or less from saturated fats, daily to have 5 or more servings of fruits and vegetables.  Education: All About Nutrition: -Group instruction provided by verbal, written material, interactive activities, discussions, models, and posters to present general guidelines for heart healthy nutrition including fat, fiber, MyPlate, the role of sodium in heart healthy nutrition, utilization of the nutrition label, and utilization of this knowledge for  meal planning. Follow up email sent as well. Written material given at graduation.   Biometrics:  Pre Biometrics - 04/05/21 0944       Pre Biometrics   Height 5' 6.75" (1.695 m)    Weight 208 lb (94.3 kg)    BMI (Calculated) 32.84    Single Leg Stand 1.64 seconds              Nutrition Therapy Plan and Nutrition Goals:   Nutrition Assessments:  MEDIFICTS Score Key: ?70 Need to make dietary changes  40-70 Heart Healthy Diet ? 40 Therapeutic Level Cholesterol Diet  Flowsheet Row Pulmonary Rehab from 04/05/2021 in [facility] Cardiac and Pulmonary Rehab  Picture Your Plate Total Score on Admission 58      Picture Your Plate Scores: <03 Unhealthy dietary pattern with much room for improvement. 41-50 Dietary pattern unlikely to meet recommendations for good health and room for improvement. 51-60 More healthful dietary pattern, with some room for improvement.  >60 Healthy dietary pattern, although there may be some specific behaviors that could be improved.   Nutrition Goals Re-Evaluation:   Nutrition Goals Discharge (Final Nutrition Goals Re-Evaluation):   Psychosocial: Target Goals:  Acknowledge presence or absence of significant depression and/or stress, maximize coping skills, provide positive support system. Participant is able to verbalize types and ability to use techniques and skills needed for reducing stress and depression.   Education: Stress, Anxiety, and Depression - Group verbal and visual presentation to define topics covered.  Reviews how body is impacted by stress, anxiety, and depression.  Also discusses healthy ways to reduce stress and to treat/manage anxiety and depression.  Written material given at graduation.   Education: Sleep Hygiene -Provides group verbal and written instruction about how sleep can affect your health.  Define sleep hygiene, discuss sleep cycles and impact of sleep habits. Review good sleep hygiene tips.    Initial Review & Psychosocial Screening:  Initial Psych Review & Screening - 03/26/21 1404       Initial Review   Current issues with Current Psychotropic Meds;Current Anxiety/Panic      Family Dynamics   Good Support System? Yes    Comments His anxiety stems from COVID and his shortness of breath. He can look to his wife and nephew for support.      Barriers   Psychosocial barriers to participate in program The patient should benefit from training in stress management and relaxation.      Screening Interventions   Interventions Encouraged to exercise;To provide support and resources with identified psychosocial needs;Provide feedback about the scores to participant    Expected Outcomes Short Term goal: Utilizing psychosocial counselor, staff and physician to assist with identification of specific Stressors or current issues interfering with healing process. Setting desired goal for each stressor or current issue identified.;Long Term Goal: Stressors or current issues are controlled or eliminated.;Short Term goal: Identification and review with participant of any Quality of Life or Depression concerns found by scoring the  questionnaire.;Long Term goal: The participant improves quality of Life and PHQ9 Scores as seen by post scores and/or verbalization of changes             Quality of Life Scores:  Scores of 19 and below usually indicate a poorer quality of life in these areas.  A difference of  2-3 points is a clinically meaningful difference.  A difference of 2-3 points in the total score of the Quality of Life Index has been associated with significant improvement in  overall quality of life, self-image, physical symptoms, and general health in studies assessing change in quality of life.  PHQ-9: Recent Review Flowsheet Data     Depression screen [facility] 2/9 04/05/2021   Decreased Interest 0   Down, Depressed, Hopeless 0   PHQ - 2 Score 0   Altered sleeping 1   Tired, decreased energy 2   Change in appetite 1   Feeling bad or failure about yourself  0   Trouble concentrating 0   Moving slowly or fidgety/restless 0   Suicidal thoughts 0   PHQ-9 Score 4   Difficult doing work/chores Not difficult at all      Interpretation of Total Score  Total Score Depression Severity:  1-4 = Minimal depression, 5-9 = Mild depression, 10-14 = Moderate depression, 15-19 = Moderately severe depression, 20-27 = Severe depression   Psychosocial Evaluation and Intervention:   Psychosocial Re-Evaluation:   Psychosocial Discharge (Final Psychosocial Re-Evaluation):   Education: Education Goals: Education classes will be provided on a weekly basis, covering required topics. Participant will state understanding/return demonstration of topics presented.  Learning Barriers/Preferences:  Learning Barriers/Preferences - 03/26/21 1403       Learning Barriers/Preferences   Learning Barriers None    Learning Preferences None             General Pulmonary Education Topics:  Infection Prevention: - Provides verbal and written material to individual with discussion of infection control including proper hand  washing and proper equipment cleaning during exercise session. Flowsheet Row Pulmonary Rehab from 03/26/2021 in [facility] Cardiac and Pulmonary Rehab  Date 03/26/21  Educator [facility]  Instruction Review Code 1- Verbalizes Understanding       Falls Prevention: - Provides verbal and written material to individual with discussion of falls prevention and safety. Flowsheet Row Pulmonary Rehab from 03/26/2021 in [facility] Cardiac and Pulmonary Rehab  Date 03/26/21  Educator [facility]  Instruction Review Code 1- Verbalizes Understanding       Chronic Lung Disease Review: - Group verbal instruction with posters, models, PowerPoint presentations and videos,  to review new updates, new respiratory medications, new advancements in procedures and treatments. Providing information on websites and "800" numbers for continued self-education. Includes information about supplement oxygen, available portable oxygen systems, continuous and intermittent flow rates, oxygen safety, concentrators, and Medicare reimbursement for oxygen. Explanation of Pulmonary Drugs, including class, frequency, complications, importance of spacers, rinsing mouth after steroid MDI's, and proper cleaning methods for nebulizers. Review of basic lung anatomy and physiology related to function, structure, and complications of lung disease. Review of risk factors. Discussion about methods for diagnosing sleep apnea and types of masks and machines for OSA. Includes a review of the use of types of environmental controls: home humidity, furnaces, filters, dust mite/pet prevention, HEPA vacuums. Discussion about weather changes, air quality and the benefits of nasal washing. Instruction on Warning signs, infection symptoms, calling MD promptly, preventive modes, and value of vaccinations. Review of effective airway clearance, coughing and/or vibration techniques. Emphasizing that all should Create an Action Plan. Written material given at graduation.   AED/CPR: -  Group verbal and written instruction with the use of models to demonstrate the basic use of the AED with the basic ABC's of resuscitation.    Anatomy and Cardiac Procedures: - Group verbal and visual presentation and models provide information about basic cardiac anatomy and function. Reviews the testing methods done to diagnose heart disease and the outcomes of the test results. Describes the treatment choices: Medical Management, Angioplasty,  or Coronary Bypass Surgery for treating various heart conditions including Myocardial Infarction, Angina, Valve Disease, and Cardiac Arrhythmias.  Written material given at graduation.   Medication Safety: - Group verbal and visual instruction to review commonly prescribed medications for heart and lung disease. Reviews the medication, class of the drug, and side effects. Includes the steps to properly store meds and maintain the prescription regimen.  Written material given at graduation.   Other: -Provides group and verbal instruction on various topics (see comments)   Knowledge Questionnaire Score:  Knowledge Questionnaire Score - 04/05/21 0947       Knowledge Questionnaire Score   Pre Score 16/18: Oxygen sats, Oxygen usage              Core Components/Risk Factors/Patient Goals at Admission:  Personal Goals and Risk Factors at Admission - 04/05/21 1000       Core Components/Risk Factors/Patient Goals on Admission    Weight Management Yes;Weight Loss    Intervention Weight Management: Develop a combined nutrition and exercise program designed to reach desired caloric intake, while maintaining appropriate intake of nutrient and fiber, sodium and fats, and appropriate energy expenditure required for the weight goal.;Weight Management: Provide education and appropriate resources to help participant work on and attain dietary goals.;Weight Management/Obesity: Establish reasonable short term and long term weight goals.    Admit Weight 208 lb  (94.3 kg)    Goal Weight: Short Term 203 lb (92.1 kg)    Goal Weight: Long Term 195 lb (88.5 kg)    Expected Outcomes Short Term: Continue to assess and modify interventions until short term weight is achieved;Weight Loss: Understanding of general recommendations for a balanced deficit meal plan, which promotes 1-2 lb weight loss per week and includes a negative energy balance of [phone] kcal/d;Long Term: Adherence to nutrition and physical activity/exercise program aimed toward attainment of established weight goal;Understanding recommendations for meals to include 15-35% energy as protein, 25-35% energy from fat, 35-60% energy from carbohydrates, less than 200mg  of dietary cholesterol, 20-35 gm of total fiber daily;Understanding of distribution of calorie intake throughout the day with the consumption of 4-5 meals/snacks    Improve shortness of breath with ADL's Yes    Intervention Provide education, individualized exercise plan and daily activity instruction to help decrease symptoms of SOB with activities of daily living.    Expected Outcomes Short Term: Improve cardiorespiratory fitness to achieve a reduction of symptoms when performing ADLs;Long Term: Be able to perform more ADLs without symptoms or delay the onset of symptoms    Hypertension Yes    Intervention Provide education on lifestyle modifcations including regular physical activity/exercise, weight management, moderate sodium restriction and increased consumption of fresh fruit, vegetables, and low fat dairy, alcohol moderation, and smoking cessation.;Monitor prescription use compliance.    Expected Outcomes Short Term: Continued assessment and intervention until BP is < 140/47mm HG in hypertensive participants. < 130/76mm HG in hypertensive participants with diabetes, heart failure or chronic kidney disease.;Long Term: Maintenance of blood pressure at goal levels.    Lipids Yes    Intervention Provide education and support for participant  on nutrition & aerobic/resistive exercise along with prescribed medications to achieve LDL 70mg , HDL >40mg .    Expected Outcomes Short Term: Participant states understanding of desired cholesterol values and is compliant with medications prescribed. Participant is following exercise prescription and nutrition guidelines.;Long Term: Cholesterol controlled with medications as prescribed, with individualized exercise RX and with personalized nutrition plan. Value goals: LDL < 70mg , HDL > 40  mg.             Education:Diabetes - Individual verbal and written instruction to review signs/symptoms of diabetes, desired ranges of glucose level fasting, after meals and with exercise. Acknowledge that pre and post exercise glucose checks will be done for 3 sessions at entry of program.   Know Your Numbers and Heart Failure: - Group verbal and visual instruction to discuss disease risk factors for cardiac and pulmonary disease and treatment options.  Reviews associated critical values for Overweight/Obesity, Hypertension, Cholesterol, and Diabetes.  Discusses basics of heart failure: signs/symptoms and treatments.  Introduces Heart Failure Zone chart for action plan for heart failure.  Written material given at graduation.   Core Components/Risk Factors/Patient Goals Review:    Core Components/Risk Factors/Patient Goals at Discharge (Final Review):    ITP Comments:  ITP Comments     Row Name 03/26/21 1402 04/05/21 0940         ITP Comments Virtual Visit completed. Patient informed on EP and RD appointment and 6 Minute walk test. Patient also informed of patient health questionnaires on My Chart. Patient Verbalizes understanding. Visit diagnosis can be found in [facility] 02/28/2021. Completed 6MWT and gym orientation. Initial ITP created and sent for review to Dr. Ottie Glazier, Medical Director.               Comments: Initial ITP

## 2021-04-12 ENCOUNTER — Other Ambulatory Visit: Payer: Self-pay

## 2021-04-12 ENCOUNTER — Encounter: Payer: Medicare HMO | Attending: Internal Medicine | Admitting: *Deleted

## 2021-04-12 DIAGNOSIS — J449 Chronic obstructive pulmonary disease, unspecified: Secondary | ICD-10-CM | POA: Insufficient documentation

## 2021-04-12 NOTE — Progress Notes (Addendum)
Daily Session Note  Patient Details  Name: STEPEHN Bowers MRN: [id number] Date of Birth: [birth year] Referring Provider:   [facility] from 04/05/2021 in [facility]  Referring Provider Emily Filbert MD       Encounter Date: 04/12/2021  Check In:  Session Check In - 04/12/21 1140       Check-In   Supervising physician immediately available to respond to emergencies See telemetry face sheet for immediately available ER MD    Location [facility]-Cardiac & Pulmonary Rehab    Staff Present Birdie Sons, MPA, RN;Melissa Hartshorne, RDN, LDN;Jessica Luan Pulling, MA, RCEP, CCRP, CCET;Nicholis Stepanek, RN, BSN, CCRP    Virtual Visit No    Medication changes reported     No    Fall or balance concerns reported    No    Warm-up and Cool-down Performed on first and last piece of equipment    Resistance Training Performed Yes    VAD Patient? No    PAD/SET Patient? No      Pain Assessment   Currently in Pain? No/denies                Social History   Tobacco Use  Smoking Status Former   Packs/day: 1.00   Years: 30.00   Pack years: 30.00   Types: Cigarettes   Quit date: 03/07/1994   Years since quitting: 27.1  Smokeless Tobacco Never    Goals Met:  Proper associated with RPD/PD & O2 Sat Independence with exercise equipment Exercise tolerated well No report of cardiac concerns or symptoms  Goals Unmet:  Not Applicable  Comments: Pt able to follow exercise prescription today without complaint.  Will continue to monitor for progression.  First full day of exercise!  Patient was oriented to gym and equipment including functions, settings, policies, and procedures.  Patient's individual exercise prescription and treatment plan were reviewed.  All starting workloads were established based on the results of the 6 minute walk test done at initial orientation visit.  The plan for exercise progression was also introduced and progression will be customized  based on patient's performance and goals.   Dr. Emily Filbert is Medical Director for [place].  Dr. Ottie Glazier is Medical Director for [facility].

## 2021-04-17 DIAGNOSIS — R0902 Hypoxemia: Secondary | ICD-10-CM | POA: Diagnosis not present

## 2021-04-17 DIAGNOSIS — J449 Chronic obstructive pulmonary disease, unspecified: Secondary | ICD-10-CM | POA: Diagnosis not present

## 2021-04-18 ENCOUNTER — Encounter: Payer: Self-pay | Admitting: *Deleted

## 2021-04-18 DIAGNOSIS — J449 Chronic obstructive pulmonary disease, unspecified: Secondary | ICD-10-CM

## 2021-04-18 NOTE — Progress Notes (Signed)
Pulmonary Individual Treatment Plan  Patient Details  Name: Bradley Bowers MRN: [id number] Date of Birth: [birth year] Referring Provider:   April Manson Pulmonary Rehab from 04/05/2021 in [facility] Cardiac and Pulmonary Rehab  Referring Provider Emily Filbert MD       Initial Encounter Date:  Flowsheet Row Pulmonary Rehab from 04/05/2021 in [facility] Cardiac and Pulmonary Rehab  Date 04/05/21       Visit Diagnosis: Chronic obstructive pulmonary disease, unspecified COPD type ([place])  Patient's Home Medications on Admission:  Current Outpatient Medications:    ADVAIR DISKUS 100-50 MCG/ACT AEPB, , Disp: , Rfl:    albuterol (VENTOLIN HFA) 108 (90 Base) MCG/ACT inhaler, Inhale into the lungs., Disp: , Rfl:    aspirin 81 MG EC tablet, TAKE 1 TABLET (81 MG TOTAL) BY MOUTH ONCE DAILY. (Patient not taking: Reported on 03/26/2021), Disp: , Rfl: 11   aspirin 81 MG EC tablet, Take by mouth., Disp: , Rfl:    atenolol (TENORMIN) 50 MG tablet, TAKE 1 TABLET (50 MG TOTAL) BY MOUTH ONCE DAILY., Disp: , Rfl: 1   atenolol (TENORMIN) 50 MG tablet, Take by mouth. (Patient not taking: Reported on 03/26/2021), Disp: , Rfl:    atorvastatin (LIPITOR) 80 MG tablet, TAKE 1 TABLET (80 MG TOTAL) BY MOUTH ONCE DAILY. (Patient not taking: Reported on 03/26/2021), Disp: , Rfl: 1   atorvastatin (LIPITOR) 80 MG tablet, Take by mouth., Disp: , Rfl:    Calcium-Magnesium-Vitamin D (CITRACAL CALCIUM+D) 619-50-932 MG-MG-UNIT TB24, Take by mouth. (Patient not taking: Reported on 03/26/2021), Disp: , Rfl:    Calcium-Magnesium-Vitamin D 600-40-500 MG-MG-UNIT TB24, Take by mouth., Disp: , Rfl:    carbamazepine (CARBATROL) 200 MG 12 hr capsule, , Disp: , Rfl:    carbamazepine (TEGRETOL) 200 MG tablet, Take 1 tablet by mouth 2 (two) times daily., Disp: , Rfl:    divalproex (DEPAKOTE ER) 500 MG 24 hr tablet, Take 3 tablets (1,500 mg total) by mouth at bedtime., Disp: 90 tablet, Rfl: 1   donepezil (ARICEPT) 5 MG tablet, Take by mouth., Disp:  , Rfl:    fluticasone (FLONASE) 50 MCG/ACT nasal spray, 1 spray by Each Nare route daily. (Patient not taking: Reported on 03/26/2021), Disp: , Rfl:    fluticasone (FLONASE) 50 MCG/ACT nasal spray, Place into the nose. (Patient not taking: Reported on 03/26/2021), Disp: , Rfl:    fluticasone furoate-vilanterol (BREO ELLIPTA) 100-25 MCG/INH AEPB, Inhale into the lungs., Disp: , Rfl:    Fluticasone-Salmeterol (ADVAIR) 250-50 MCG/DOSE AEPB, Inhale into the lungs., Disp: , Rfl:    gabapentin (NEURONTIN) 300 MG capsule, Take by mouth., Disp: , Rfl:    lisinopril (PRINIVIL,ZESTRIL) 5 MG tablet, TAKE 1 TABLET (5 MG TOTAL) BY MOUTH ONCE DAILY. (Patient not taking: Reported on 03/26/2021), Disp: , Rfl: 11   lisinopril (ZESTRIL) 5 MG tablet, Take 1 tablet by mouth daily., Disp: , Rfl:    methocarbamol (ROBAXIN) 500 MG tablet, Take 500 mg by mouth 3 (three) times daily as needed., Disp: , Rfl: 1   methocarbamol (ROBAXIN) 500 MG tablet, Take by mouth., Disp: , Rfl:    montelukast (SINGULAIR) 10 MG tablet, Take 10 mg by mouth at bedtime., Disp: , Rfl:    OLANZapine (ZYPREXA) 10 MG tablet, Take 1 tablet (10 mg total) by mouth at bedtime., Disp: 30 tablet, Rfl: 1   OLANZapine (ZYPREXA) 5 MG tablet, Take by mouth., Disp: , Rfl:    omeprazole (PRILOSEC) 40 MG capsule, TAKE 1 CAPSULE (40 MG TOTAL) BY MOUTH 2 (TWO) TIMES  DAILY. (Patient not taking: Reported on 03/26/2021), Disp: , Rfl: 3   omeprazole (PRILOSEC) 40 MG capsule, Take 1 capsule by mouth 2 (two) times daily., Disp: , Rfl:    ticagrelor (BRILINTA) 90 MG TABS tablet, Take by mouth., Disp: , Rfl:    timolol (TIMOPTIC) 0.5 % ophthalmic solution, USE 1 DROP(S) IN BOTH EYES ONCE A DAY, Disp: , Rfl: 5   tiotropium (SPIRIVA) 18 MCG inhalation capsule, Place into inhaler and inhale. (Patient not taking: Reported on 03/26/2021), Disp: , Rfl:    tiotropium (SPIRIVA) 18 MCG inhalation capsule, Place into inhaler and inhale., Disp: , Rfl:    Travoprost, BAK Free,  (TRAVATAN) 0.004 % SOLN ophthalmic solution, Frequency:PHARMDIR   Dosage:0.0     Instructions:  Note:1 gtt in each eye q pm Dose: 1, Disp: , Rfl:    vitamin B-12 1000 MCG tablet, Take 1 tablet (1,000 mcg total) by mouth daily., Disp: 30 tablet, Rfl: 1  Past Medical History: Past Medical History:  Diagnosis Date   Arterial vascular disease 10/24/2014   Overview:  MI 1994, small-vessel disease requiring coumadin    Asthma    Centrilobular emphysema ([place]) 10/24/2014   Overview:  2L O2 prn    Centrilobular emphysema (HCC)    Chronic obstructive pulmonary disease ([place]) 11/06/2015   Coronary artery disease    GERD (gastroesophageal reflux disease)    Glaucoma    Heart attack ([place]) 2/17, 1994   Heart disease    Hyperlipemia    Hypertension    Restless leg 10/24/2014   Sleep apnea     Tobacco Use: Social History   Tobacco Use  Smoking Status Former   Packs/day: 1.00   Years: 30.00   Pack years: 30.00   Types: Cigarettes   Quit date: 03/07/1994   Years since quitting: 27.1  Smokeless Tobacco Never    Labs: Recent Review Scientist, physiological     Labs for ITP Cardiac and Pulmonary Rehab Latest Ref Rng & Units 09/24/2016   Cholestrol 0 - 200 mg/dL 177   LDLCALC 0 - 99 mg/dL 111(H)   HDL >40 mg/dL 41   Trlycerides <150 mg/dL 126   Hemoglobin A1c 4.8 - 5.6 % 5.2        Pulmonary Assessment Scores:  Pulmonary Assessment Scores     Row Name 04/05/21 0945         ADL UCSD   ADL Phase Entry     SOB Score total 19     Rest 0     Walk 2     Stairs 3     Bath 0     Dress 1     Shop 0           CAT Score     CAT Score 16           mMRC Score     mMRC Score 2             UCSD: Self-administered rating of dyspnea associated with activities of daily living (ADLs) 6-point scale (0 = "not at all" to 5 = "maximal or unable to do because of breathlessness")  Scoring Scores range from 0 to 120.  Minimally important difference is 5 units  CAT: CAT can identify the health  impairment of COPD patients and is better correlated with disease progression.  CAT has a scoring range of zero to 40. The CAT score is classified into four groups of low (less than 10), medium (10 - 20),  high (21-30) and very high (31-40) based on the impact level of disease on health status. A CAT score over 10 suggests significant symptoms.  A worsening CAT score could be explained by an exacerbation, poor medication adherence, poor inhaler technique, or progression of COPD or comorbid conditions.  CAT MCID is 2 points  mMRC: mMRC (Modified Medical Research Council) Dyspnea Scale is used to assess the degree of baseline functional disability in patients of respiratory disease due to dyspnea. No minimal important difference is established. A decrease in score of 1 point or greater is considered a positive change.   Pulmonary Function Assessment:  Pulmonary Function Assessment - 03/26/21 1403       Breath   Shortness of Breath Yes;Limiting activity             Exercise Target Goals: Exercise Program Goal: Individual exercise prescription set using results from initial 6 min walk test and THRR while considering  patient's activity barriers and safety.   Exercise Prescription Goal: Initial exercise prescription builds to 30-45 minutes a day of aerobic activity, 2-3 days per week.  Home exercise guidelines will be given to patient during program as part of exercise prescription that the participant will acknowledge.  Education: Aerobic Exercise: - Group verbal and visual presentation on the components of exercise prescription. Introduces F.I.T.T principle from ACSM for exercise prescriptions.  Reviews F.I.T.T. principles of aerobic exercise including progression. Written material given at graduation.   Education: Resistance Exercise: - Group verbal and visual presentation on the components of exercise prescription. Introduces F.I.T.T principle from ACSM for exercise prescriptions   Reviews F.I.T.T. principles of resistance exercise including progression. Written material given at graduation.    Education: Exercise & Equipment Safety: - Individual verbal instruction and demonstration of equipment use and safety with use of the equipment. Flowsheet Row Pulmonary Rehab from 04/12/2021 in [facility] Cardiac and Pulmonary Rehab  Date 03/26/21  Educator [facility]  Instruction Review Code 1- Verbalizes Understanding       Education: Exercise Physiology & General Exercise Guidelines: - Group verbal and written instruction with models to review the exercise physiology of the cardiovascular system and associated critical values. Provides general exercise guidelines with specific guidelines to those with heart or lung disease.    Education: Flexibility, Balance, Mind/Body Relaxation: - Group verbal and visual presentation with interactive activity on the components of exercise prescription. Introduces F.I.T.T principle from ACSM for exercise prescriptions. Reviews F.I.T.T. principles of flexibility and balance exercise training including progression. Also discusses the mind body connection.  Reviews various relaxation techniques to help reduce and manage stress (i.e. Deep breathing, progressive muscle relaxation, and visualization). Balance handout provided to take home. Written material given at graduation. Flowsheet Row Pulmonary Rehab from 04/12/2021 in [facility] Cardiac and Pulmonary Rehab  Date 04/12/21  Educator [facility]  Instruction Review Code 1- Verbalizes Understanding       Activity Barriers & Risk Stratification:  Activity Barriers & Cardiac Risk Stratification - 04/05/21 0944       Activity Barriers & Cardiac Risk Stratification   Activity Barriers Shortness of Breath;Deconditioning;Muscular Weakness             6 Minute Walk:  6 Minute Walk     Row Name 04/05/21 0953         6 Minute Walk   Phase Initial     Distance 400 feet     Walk Time 2 minutes  Stopped early due  to low O2 sats     # of  Rest Breaks 0     MPH 2.27     METS 1.13     RPE 15     Perceived Dyspnea  2     VO2 Peak 3.96     Symptoms Yes (comment)     Comments SOB     Resting HR 84 bpm     Resting BP 132/72     Resting Oxygen Saturation  94 %     Exercise Oxygen Saturation  during 6 min walk 80 %  Stopped early     Max Ex. HR 104 bpm     Max Ex. BP 162/70     2 Minute Post BP 140/70           Interval HR     1 Minute HR 94     2 Minute HR 104     3 Minute HR --  Test stopped early     4 Minute HR --  Test stopped early     5 Minute HR --  Test stopped early     6 Minute HR --  Test stopped early     2 Minute Post HR 88     Interval Heart Rate? Yes           Interval Oxygen     Interval Oxygen? Yes     Baseline Oxygen Saturation % 94 %     1 Minute Oxygen Saturation % 87 %     1 Minute Liters of Oxygen 0 L  RA     2 Minute Oxygen Saturation % 80 %  Test stopped     2 Minute Liters of Oxygen 0 L     3 Minute Liters of Oxygen 0 L     4 Minute Liters of Oxygen 0 L     5 Minute Liters of Oxygen 0 L     6 Minute Liters of Oxygen 0 L     2 Minute Post Oxygen Saturation % 90 %     2 Minute Post Liters of Oxygen 0 L            Oxygen Initial Assessment:  Oxygen Initial Assessment - 04/05/21 0945       Home Oxygen   Home Oxygen Device None    Sleep Oxygen Prescription None    Home Exercise Oxygen Prescription None    Home Resting Oxygen Prescription None    Compliance with Home Oxygen Use Yes      Initial 6 min Walk   Oxygen Used None      Program Oxygen Prescription   Program Oxygen Prescription None      Intervention   Short Term Goals To learn and understand importance of monitoring SPO2 with pulse oximeter and demonstrate accurate use of the pulse oximeter.;To learn and exhibit compliance with exercise, home and travel O2 prescription;To learn and understand importance of maintaining oxygen saturations>88%;To learn and demonstrate proper pursed lip breathing  techniques or other breathing techniques. ;To learn and demonstrate proper use of respiratory medications    Long  Term Goals Exhibits compliance with exercise, home  and travel O2 prescription;Maintenance of O2 saturations>88%;Verbalizes importance of monitoring SPO2 with pulse oximeter and return demonstration;Exhibits proper breathing techniques, such as pursed lip breathing or other method taught during program session;Compliance with respiratory medication;Demonstrates proper use of MDI's             Oxygen Re-Evaluation:   Oxygen Discharge (Final Oxygen Re-Evaluation):   Initial Exercise Prescription:  Initial Exercise Prescription - 04/05/21 0900       Date of Initial Exercise RX and Referring Provider   Date 04/05/21    Referring Provider Emily Filbert MD      Treadmill   MPH 1.7    Grade 0    Minutes 15    METs 2.3      REL-XR   Level 1    Speed 50    Minutes 15    METs 1.1      T5 Nustep   Level 1    SPM 80    Minutes 15    METs 1.1      Prescription Details   Frequency (times per week) 2    Duration Progress to 30 minutes of continuous aerobic without signs/symptoms of physical distress      Intensity   THRR 40-80% of Max Heartrate 109-134    Ratings of Perceived Exertion 11-13    Perceived Dyspnea 0-4      Progression   Progression Continue to progress workloads to maintain intensity without signs/symptoms of physical distress.      Resistance Training   Training Prescription Yes    Weight 3 lb    Reps 10-15             Perform Capillary Blood Glucose checks as needed.  Exercise Prescription Changes:   Exercise Prescription Changes     Row Name 04/05/21 0900             Response to Exercise   Blood Pressure (Admit) 132/72       Blood Pressure (Exercise) 162/70       Blood Pressure (Exit) 140/70       Heart Rate (Admit) 84 bpm       Heart Rate (Exercise) 104 bpm       Heart Rate (Exit) 88 bpm       Oxygen Saturation (Admit)  94 %       Oxygen Saturation (Exercise) 80 %       Oxygen Saturation (Exit) 94 %       Rating of Perceived Exertion (Exercise) 15       Perceived Dyspnea (Exercise) 2       Symptoms SOB       Comments walk test results                Exercise Comments:   Exercise Comments     Row Name 04/12/21 1157           Exercise Comments First full day of exercise!  Patient was oriented to gym and equipment including functions, settings, policies, and procedures.  Patient's individual exercise prescription and treatment plan were reviewed.  All starting workloads were established based on the results of the 6 minute walk test done at initial orientation visit.  The plan for exercise progression was also introduced and progression will be customized based on patient's performance and goals.                Exercise Goals and Review:   Exercise Goals     Row Name 04/05/21 1000             Exercise Goals   Increase Physical Activity Yes       Intervention Provide advice, education, support and counseling about physical activity/exercise needs.;Develop an individualized exercise prescription for aerobic and resistive training based on initial evaluation findings, risk stratification, comorbidities and participant's personal goals.  Expected Outcomes Short Term: Attend rehab on a regular basis to increase amount of physical activity.;Long Term: Add in home exercise to make exercise part of routine and to increase amount of physical activity.;Long Term: Exercising regularly at least 3-5 days a week.       Increase Strength and Stamina Yes       Intervention Provide advice, education, support and counseling about physical activity/exercise needs.;Develop an individualized exercise prescription for aerobic and resistive training based on initial evaluation findings, risk stratification, comorbidities and participant's personal goals.       Expected Outcomes Short Term: Increase  workloads from initial exercise prescription for resistance, speed, and METs.;Short Term: Perform resistance training exercises routinely during rehab and add in resistance training at home;Long Term: Improve cardiorespiratory fitness, muscular endurance and strength as measured by increased METs and functional capacity (6MWT)       Able to understand and use rate of perceived exertion (RPE) scale Yes       Intervention Provide education and explanation on how to use RPE scale       Expected Outcomes Short Term: Able to use RPE daily in rehab to express subjective intensity level;Long Term:  Able to use RPE to guide intensity level when exercising independently       Able to understand and use Dyspnea scale Yes       Intervention Provide education and explanation on how to use Dyspnea scale       Expected Outcomes Short Term: Able to use Dyspnea scale daily in rehab to express subjective sense of shortness of breath during exertion;Long Term: Able to use Dyspnea scale to guide intensity level when exercising independently       Knowledge and understanding of Target Heart Rate Range (THRR) Yes       Intervention Provide education and explanation of THRR including how the numbers were predicted and where they are located for reference       Expected Outcomes Short Term: Able to state/look up THRR;Short Term: Able to use daily as guideline for intensity in rehab;Long Term: Able to use THRR to govern intensity when exercising independently       Able to check pulse independently Yes       Intervention Provide education and demonstration on how to check pulse in carotid and radial arteries.;Review the importance of being able to check your own pulse for safety during independent exercise       Expected Outcomes Short Term: Able to explain why pulse checking is important during independent exercise;Long Term: Able to check pulse independently and accurately       Understanding of Exercise Prescription Yes        Intervention Provide education, explanation, and written materials on patient's individual exercise prescription       Expected Outcomes Short Term: Able to explain program exercise prescription;Long Term: Able to explain home exercise prescription to exercise independently                Exercise Goals Re-Evaluation :  Exercise Goals Re-Evaluation     Row Name 04/12/21 1157             Exercise Goal Re-Evaluation   Exercise Goals Review Able to understand and use rate of perceived exertion (RPE) scale;Knowledge and understanding of Target Heart Rate Range (THRR);Able to understand and use Dyspnea scale;Understanding of Exercise Prescription       Comments Reviewed RPE and dyspnea scales, THR and program prescription with pt today.  Pt  voiced understanding and was given a copy of goals to take home.       Expected Outcomes Short: Use RPE daily to regulate intensity. Long: Follow program prescription in THR.                Discharge Exercise Prescription (Final Exercise Prescription Changes):  Exercise Prescription Changes - 04/05/21 0900       Response to Exercise   Blood Pressure (Admit) 132/72    Blood Pressure (Exercise) 162/70    Blood Pressure (Exit) 140/70    Heart Rate (Admit) 84 bpm    Heart Rate (Exercise) 104 bpm    Heart Rate (Exit) 88 bpm    Oxygen Saturation (Admit) 94 %    Oxygen Saturation (Exercise) 80 %    Oxygen Saturation (Exit) 94 %    Rating of Perceived Exertion (Exercise) 15    Perceived Dyspnea (Exercise) 2    Symptoms SOB    Comments walk test results             Nutrition:  Target Goals: Understanding of nutrition guidelines, daily intake of sodium 1500mg , cholesterol 200mg , calories 30% from fat and 7% or less from saturated fats, daily to have 5 or more servings of fruits and vegetables.  Education: All About Nutrition: -Group instruction provided by verbal, written material, interactive activities, discussions, models, and  posters to present general guidelines for heart healthy nutrition including fat, fiber, MyPlate, the role of sodium in heart healthy nutrition, utilization of the nutrition label, and utilization of this knowledge for meal planning. Follow up email sent as well. Written material given at graduation.   Biometrics:  Pre Biometrics - 04/05/21 0944       Pre Biometrics   Height 5' 6.75" (1.695 m)    Weight 208 lb (94.3 kg)    BMI (Calculated) 32.84    Single Leg Stand 1.64 seconds              Nutrition Therapy Plan and Nutrition Goals:   Nutrition Assessments:  MEDIFICTS Score Key: ?70 Need to make dietary changes  40-70 Heart Healthy Diet ? 40 Therapeutic Level Cholesterol Diet  Flowsheet Row Pulmonary Rehab from 04/05/2021 in [facility] Cardiac and Pulmonary Rehab  Picture Your Plate Total Score on Admission 58      Picture Your Plate Scores: <78 Unhealthy dietary pattern with much room for improvement. 41-50 Dietary pattern unlikely to meet recommendations for good health and room for improvement. 51-60 More healthful dietary pattern, with some room for improvement.  >60 Healthy dietary pattern, although there may be some specific behaviors that could be improved.   Nutrition Goals Re-Evaluation:   Nutrition Goals Discharge (Final Nutrition Goals Re-Evaluation):   Psychosocial: Target Goals: Acknowledge presence or absence of significant depression and/or stress, maximize coping skills, provide positive support system. Participant is able to verbalize types and ability to use techniques and skills needed for reducing stress and depression.   Education: Stress, Anxiety, and Depression - Group verbal and visual presentation to define topics covered.  Reviews how body is impacted by stress, anxiety, and depression.  Also discusses healthy ways to reduce stress and to treat/manage anxiety and depression.  Written material given at graduation.   Education: Sleep  Hygiene -Provides group verbal and written instruction about how sleep can affect your health.  Define sleep hygiene, discuss sleep cycles and impact of sleep habits. Review good sleep hygiene tips.    Initial Review & Psychosocial Screening:  Initial Psych Review &  Screening - 03/26/21 1404       Initial Review   Current issues with Current Psychotropic Meds;Current Anxiety/Panic      Family Dynamics   Good Support System? Yes    Comments His anxiety stems from COVID and his shortness of breath. He can look to his wife and nephew for support.      Barriers   Psychosocial barriers to participate in program The patient should benefit from training in stress management and relaxation.      Screening Interventions   Interventions Encouraged to exercise;To provide support and resources with identified psychosocial needs;Provide feedback about the scores to participant    Expected Outcomes Short Term goal: Utilizing psychosocial counselor, staff and physician to assist with identification of specific Stressors or current issues interfering with healing process. Setting desired goal for each stressor or current issue identified.;Long Term Goal: Stressors or current issues are controlled or eliminated.;Short Term goal: Identification and review with participant of any Quality of Life or Depression concerns found by scoring the questionnaire.;Long Term goal: The participant improves quality of Life and PHQ9 Scores as seen by post scores and/or verbalization of changes             Quality of Life Scores:  Scores of 19 and below usually indicate a poorer quality of life in these areas.  A difference of  2-3 points is a clinically meaningful difference.  A difference of 2-3 points in the total score of the Quality of Life Index has been associated with significant improvement in overall quality of life, self-image, physical symptoms, and general health in studies assessing change in quality of  life.  PHQ-9: Recent Review Flowsheet Data     Depression screen [facility] 2/9 04/05/2021   Decreased Interest 0   Down, Depressed, Hopeless 0   PHQ - 2 Score 0   Altered sleeping 1   Tired, decreased energy 2   Change in appetite 1   Feeling bad or failure about yourself  0   Trouble concentrating 0   Moving slowly or fidgety/restless 0   Suicidal thoughts 0   PHQ-9 Score 4   Difficult doing work/chores Not difficult at all      Interpretation of Total Score  Total Score Depression Severity:  1-4 = Minimal depression, 5-9 = Mild depression, 10-14 = Moderate depression, 15-19 = Moderately severe depression, 20-27 = Severe depression   Psychosocial Evaluation and Intervention:   Psychosocial Re-Evaluation:   Psychosocial Discharge (Final Psychosocial Re-Evaluation):   Education: Education Goals: Education classes will be provided on a weekly basis, covering required topics. Participant will state understanding/return demonstration of topics presented.  Learning Barriers/Preferences:  Learning Barriers/Preferences - 03/26/21 1403       Learning Barriers/Preferences   Learning Barriers None    Learning Preferences None             General Pulmonary Education Topics:  Infection Prevention: - Provides verbal and written material to individual with discussion of infection control including proper hand washing and proper equipment cleaning during exercise session. Flowsheet Row Pulmonary Rehab from 04/12/2021 in [facility] Cardiac and Pulmonary Rehab  Date 03/26/21  Educator [facility]  Instruction Review Code 1- Verbalizes Understanding       Falls Prevention: - Provides verbal and written material to individual with discussion of falls prevention and safety. Flowsheet Row Pulmonary Rehab from 04/12/2021 in [facility] Cardiac and Pulmonary Rehab  Date 03/26/21  Educator [facility]  Instruction Review Code 1- Verbalizes Understanding  Chronic Lung Disease Review: - Group verbal  instruction with posters, models, PowerPoint presentations and videos,  to review new updates, new respiratory medications, new advancements in procedures and treatments. Providing information on websites and "800" numbers for continued self-education. Includes information about supplement oxygen, available portable oxygen systems, continuous and intermittent flow rates, oxygen safety, concentrators, and Medicare reimbursement for oxygen. Explanation of Pulmonary Drugs, including class, frequency, complications, importance of spacers, rinsing mouth after steroid MDI's, and proper cleaning methods for nebulizers. Review of basic lung anatomy and physiology related to function, structure, and complications of lung disease. Review of risk factors. Discussion about methods for diagnosing sleep apnea and types of masks and machines for OSA. Includes a review of the use of types of environmental controls: home humidity, furnaces, filters, dust mite/pet prevention, HEPA vacuums. Discussion about weather changes, air quality and the benefits of nasal washing. Instruction on Warning signs, infection symptoms, calling MD promptly, preventive modes, and value of vaccinations. Review of effective airway clearance, coughing and/or vibration techniques. Emphasizing that all should Create an Action Plan. Written material given at graduation.   AED/CPR: - Group verbal and written instruction with the use of models to demonstrate the basic use of the AED with the basic ABC's of resuscitation.    Anatomy and Cardiac Procedures: - Group verbal and visual presentation and models provide information about basic cardiac anatomy and function. Reviews the testing methods done to diagnose heart disease and the outcomes of the test results. Describes the treatment choices: Medical Management, Angioplasty, or Coronary Bypass Surgery for treating various heart conditions including Myocardial Infarction, Angina, Valve Disease, and  Cardiac Arrhythmias.  Written material given at graduation.   Medication Safety: - Group verbal and visual instruction to review commonly prescribed medications for heart and lung disease. Reviews the medication, class of the drug, and side effects. Includes the steps to properly store meds and maintain the prescription regimen.  Written material given at graduation.   Other: -Provides group and verbal instruction on various topics (see comments)   Knowledge Questionnaire Score:  Knowledge Questionnaire Score - 04/05/21 0947       Knowledge Questionnaire Score   Pre Score 16/18: Oxygen sats, Oxygen usage              Core Components/Risk Factors/Patient Goals at Admission:  Personal Goals and Risk Factors at Admission - 04/05/21 1000       Core Components/Risk Factors/Patient Goals on Admission    Weight Management Yes;Weight Loss    Intervention Weight Management: Develop a combined nutrition and exercise program designed to reach desired caloric intake, while maintaining appropriate intake of nutrient and fiber, sodium and fats, and appropriate energy expenditure required for the weight goal.;Weight Management: Provide education and appropriate resources to help participant work on and attain dietary goals.;Weight Management/Obesity: Establish reasonable short term and long term weight goals.    Admit Weight 208 lb (94.3 kg)    Goal Weight: Short Term 203 lb (92.1 kg)    Goal Weight: Long Term 195 lb (88.5 kg)    Expected Outcomes Short Term: Continue to assess and modify interventions until short term weight is achieved;Weight Loss: Understanding of general recommendations for a balanced deficit meal plan, which promotes 1-2 lb weight loss per week and includes a negative energy balance of [phone] kcal/d;Long Term: Adherence to nutrition and physical activity/exercise program aimed toward attainment of established weight goal;Understanding recommendations for meals to include  15-35% energy as protein, 25-35% energy from fat, 35-60% energy  from carbohydrates, less than 200mg  of dietary cholesterol, 20-35 gm of total fiber daily;Understanding of distribution of calorie intake throughout the day with the consumption of 4-5 meals/snacks    Improve shortness of breath with ADL's Yes    Intervention Provide education, individualized exercise plan and daily activity instruction to help decrease symptoms of SOB with activities of daily living.    Expected Outcomes Short Term: Improve cardiorespiratory fitness to achieve a reduction of symptoms when performing ADLs;Long Term: Be able to perform more ADLs without symptoms or delay the onset of symptoms    Hypertension Yes    Intervention Provide education on lifestyle modifcations including regular physical activity/exercise, weight management, moderate sodium restriction and increased consumption of fresh fruit, vegetables, and low fat dairy, alcohol moderation, and smoking cessation.;Monitor prescription use compliance.    Expected Outcomes Short Term: Continued assessment and intervention until BP is < 140/66mm HG in hypertensive participants. < 130/49mm HG in hypertensive participants with diabetes, heart failure or chronic kidney disease.;Long Term: Maintenance of blood pressure at goal levels.    Lipids Yes    Intervention Provide education and support for participant on nutrition & aerobic/resistive exercise along with prescribed medications to achieve LDL 70mg , HDL >40mg .    Expected Outcomes Short Term: Participant states understanding of desired cholesterol values and is compliant with medications prescribed. Participant is following exercise prescription and nutrition guidelines.;Long Term: Cholesterol controlled with medications as prescribed, with individualized exercise RX and with personalized nutrition plan. Value goals: LDL < 70mg , HDL > 40 mg.             Education:Diabetes - Individual verbal and written  instruction to review signs/symptoms of diabetes, desired ranges of glucose level fasting, after meals and with exercise. Acknowledge that pre and post exercise glucose checks will be done for 3 sessions at entry of program.   Know Your Numbers and Heart Failure: - Group verbal and visual instruction to discuss disease risk factors for cardiac and pulmonary disease and treatment options.  Reviews associated critical values for Overweight/Obesity, Hypertension, Cholesterol, and Diabetes.  Discusses basics of heart failure: signs/symptoms and treatments.  Introduces Heart Failure Zone chart for action plan for heart failure.  Written material given at graduation.   Core Components/Risk Factors/Patient Goals Review:    Core Components/Risk Factors/Patient Goals at Discharge (Final Review):    ITP Comments:  ITP Comments     Row Name 03/26/21 1402 04/05/21 0940 04/12/21 1157 04/18/21 1203     ITP Comments Virtual Visit completed. Patient informed on EP and RD appointment and 6 Minute walk test. Patient also informed of patient health questionnaires on My Chart. Patient Verbalizes understanding. Visit diagnosis can be found in [facility] 02/28/2021. Completed 6MWT and gym orientation. Initial ITP created and sent for review to Dr. Ottie Glazier, Medical Director. First full day of exercise!  Patient was oriented to gym and equipment including functions, settings, policies, and procedures.  Patient's individual exercise prescription and treatment plan were reviewed.  All starting workloads were established based on the results of the 6 minute walk test done at initial orientation visit.  The plan for exercise progression was also introduced and progression will be customized based on patient's performance and goals. 30 Day review completed. Medical Director ITP review done, changes made as directed, and signed approval by Medical Director.             Comments:

## 2021-04-19 DIAGNOSIS — J449 Chronic obstructive pulmonary disease, unspecified: Secondary | ICD-10-CM

## 2021-04-24 ENCOUNTER — Encounter: Payer: Medicare HMO | Admitting: *Deleted

## 2021-04-24 ENCOUNTER — Other Ambulatory Visit: Payer: Self-pay

## 2021-04-24 DIAGNOSIS — J449 Chronic obstructive pulmonary disease, unspecified: Secondary | ICD-10-CM

## 2021-04-24 NOTE — Progress Notes (Signed)
Daily Session Note  Patient Details  Name: Bradley Bowers MRN: [id number] Date of Birth: 05-31-1946 Referring Provider:   April Manson Pulmonary Rehab from 04/05/2021 in [facility] Cardiac and Pulmonary Rehab  Referring Provider Emily Filbert MD       Encounter Date: 04/24/2021  Check In:  Session Check In - 04/24/21 0941       Check-In   Supervising physician immediately available to respond to emergencies See telemetry face sheet for immediately available ER MD    Location [facility]-Cardiac & Pulmonary Rehab    Staff Present Hope Budds, RDN, Luther Redo, MPA, RN;Thurston Brendlinger [place], MA, RCEP, CCRP, Marylynn Pearson, MS, ASCM CEP, Exercise Physiologist;Laureen Owens Shark, BS, RRT, CPFT;Susanne Bice, RN, BSN, CCRP    Virtual Visit No    Medication changes reported     No    Fall or balance concerns reported    No    Warm-up and Cool-down Performed on first and last piece of equipment    Resistance Training Performed Yes    VAD Patient? No    PAD/SET Patient? No      Pain Assessment   Currently in Pain? No/denies                Social History   Tobacco Use  Smoking Status Former   Packs/day: 1.00   Years: 30.00   Pack years: 30.00   Types: Cigarettes   Quit date: 03/07/1994   Years since quitting: 27.1  Smokeless Tobacco Never    Goals Met:  Proper associated with RPD/PD & O2 Sat Independence with exercise equipment Using PLB without cueing & demonstrates good technique Exercise tolerated well No report of cardiac concerns or symptoms Strength training completed today  Goals Unmet:  Not Applicable  Comments: Pt able to follow exercise prescription today without complaint.  Will continue to monitor for progression.    Dr. Emily Filbert is Medical Director for [facility].  Dr. Ottie Glazier is Medical Director for [facility] Pulmonary Rehabilitation.

## 2021-04-26 ENCOUNTER — Other Ambulatory Visit: Payer: Self-pay

## 2021-04-26 DIAGNOSIS — J449 Chronic obstructive pulmonary disease, unspecified: Secondary | ICD-10-CM | POA: Diagnosis not present

## 2021-04-26 NOTE — Progress Notes (Signed)
Daily Session Note  Patient Details  Name: Bradley Bowers MRN: [id number] Date of Birth: 04-Dec-1945 Referring Provider:   April Manson Pulmonary Rehab from 04/05/2021 in [facility] Cardiac and Pulmonary Rehab  Referring Provider Emily Filbert MD       Encounter Date: 04/26/2021  Check In:  Session Check In - 04/26/21 1041       Check-In   Supervising physician immediately available to respond to emergencies See telemetry face sheet for immediately available ER MD    Location [facility]-Cardiac & Pulmonary Rehab    Staff Present Birdie Sons, MPA, RN;Melissa Rusk, RDN, Rowe Pavy, BA, ACSM CEP, Exercise Physiologist;Joseph Gordon, [place]    Virtual Visit No    Medication changes reported     No    Fall or balance concerns reported    No    Warm-up and Cool-down Performed on first and last piece of equipment    Resistance Training Performed Yes    VAD Patient? No    PAD/SET Patient? No      Pain Assessment   Currently in Pain? No/denies                Social History   Tobacco Use  Smoking Status Former   Packs/day: 1.00   Years: 30.00   Pack years: 30.00   Types: Cigarettes   Quit date: 03/07/1994   Years since quitting: 27.1  Smokeless Tobacco Never    Goals Met:  Independence with exercise equipment Exercise tolerated well No report of cardiac concerns or symptoms Strength training completed today  Goals Unmet:  Not Applicable  Comments: Pt able to follow exercise prescription today without complaint.  Will continue to monitor for progression.    Dr. Emily Filbert is Medical Director for [place].  Dr. Ottie Glazier is Medical Director for [facility] Pulmonary Rehabilitation.

## 2021-05-01 ENCOUNTER — Other Ambulatory Visit: Payer: Self-pay

## 2021-05-01 DIAGNOSIS — J449 Chronic obstructive pulmonary disease, unspecified: Secondary | ICD-10-CM

## 2021-05-01 NOTE — Progress Notes (Signed)
Daily Session Note  Patient Details  Name: Bradley Bowers MRN: [id number] Date of Birth: [birth year] Referring Provider:   April Manson Pulmonary Rehab from 04/05/2021 in [facility] Cardiac and Pulmonary Rehab  Referring Provider Emily Filbert MD       Encounter Date: 05/01/2021  Check In:  Session Check In - 05/01/21 0947       Check-In   Supervising physician immediately available to respond to emergencies See telemetry face sheet for immediately available ER MD    Location [facility]-Cardiac & Pulmonary Rehab    Staff Present Birdie Sons, MPA, RN;Jessica Luan Pulling, MA, RCEP, CCRP, CCET;Amanda Sommer, BA, ACSM CEP, Exercise Physiologist    Virtual Visit No    Medication changes reported     No    Fall or balance concerns reported    No    Warm-up and Cool-down Performed on first and last piece of equipment    Resistance Training Performed Yes    VAD Patient? No    PAD/SET Patient? No      Pain Assessment   Currently in Pain? No/denies                Social History   Tobacco Use  Smoking Status Former   Packs/day: 1.00   Years: 30.00   Pack years: 30.00   Types: Cigarettes   Quit date: 03/07/1994   Years since quitting: 27.1  Smokeless Tobacco Never    Goals Met:  Independence with exercise equipment Exercise tolerated well No report of cardiac concerns or symptoms Strength training completed today  Goals Unmet:  Not Applicable  Comments: Pt able to follow exercise prescription today without complaint.  Will continue to monitor for progression.    Dr. Emily Filbert is Medical Director for [place].  Dr. Ottie Glazier is Medical Director for [facility] Pulmonary Rehabilitation.

## 2021-05-03 ENCOUNTER — Other Ambulatory Visit: Payer: Self-pay

## 2021-05-03 DIAGNOSIS — J449 Chronic obstructive pulmonary disease, unspecified: Secondary | ICD-10-CM

## 2021-05-03 NOTE — Progress Notes (Signed)
Daily Session Note  Patient Details  Name: Bradley Bowers MRN: [id number] Date of Birth: [birth year] Referring Provider:   April Manson Pulmonary Rehab from 04/05/2021 in [facility] Cardiac and Pulmonary Rehab  Referring Provider Emily Filbert MD       Encounter Date: 05/03/2021  Check In:  Session Check In - 05/03/21 0941       Check-In   Supervising physician immediately available to respond to emergencies See telemetry face sheet for immediately available ER MD    Location [facility]-Cardiac & Pulmonary Rehab    Staff Present Birdie Sons, MPA, Elveria Rising, BA, ACSM CEP, Exercise Physiologist;Jaslyne Beeck Amedeo Plenty, BS, ACSM CEP, Exercise Physiologist    Virtual Visit No    Medication changes reported     No    Fall or balance concerns reported    No    Warm-up and Cool-down Performed on first and last piece of equipment    Resistance Training Performed Yes    VAD Patient? No    PAD/SET Patient? No      Pain Assessment   Currently in Pain? No/denies                Social History   Tobacco Use  Smoking Status Former   Packs/day: 1.00   Years: 30.00   Pack years: 30.00   Types: Cigarettes   Quit date: 03/07/1994   Years since quitting: 27.1  Smokeless Tobacco Never    Goals Met:  Independence with exercise equipment Exercise tolerated well No report of cardiac concerns or symptoms Strength training completed today  Goals Unmet:  Not Applicable  Comments: Pt able to follow exercise prescription today without complaint.  Will continue to monitor for progression.    Dr. Emily Filbert is Medical Director for [place].  Dr. Ottie Glazier is Medical Director for [facility] Pulmonary Rehabilitation.

## 2021-05-08 ENCOUNTER — Other Ambulatory Visit: Payer: Self-pay

## 2021-05-08 ENCOUNTER — Encounter: Payer: Medicare HMO | Attending: Internal Medicine

## 2021-05-08 DIAGNOSIS — J449 Chronic obstructive pulmonary disease, unspecified: Secondary | ICD-10-CM | POA: Insufficient documentation

## 2021-05-08 NOTE — Progress Notes (Signed)
Daily Session Note  Patient Details  Name: KIOWA HOLLAR MRN: [id number] Date of Birth: 04/14/1946 Referring Provider:   April Manson Pulmonary Rehab from 04/05/2021 in [facility] Cardiac and Pulmonary Rehab  Referring Provider Emily Filbert MD       Encounter Date: 05/08/2021  Check In:  Session Check In - 05/08/21 0948       Check-In   Supervising physician immediately available to respond to emergencies See telemetry face sheet for immediately available ER MD    Location [facility]-Cardiac & Pulmonary Rehab    Staff Present Birdie Sons, MPA, RN;Susanne Bice, RN, BSN, Jacklynn Bue, MS, ASCM CEP, Exercise Physiologist;Amanda Oletta Darter, BA, ACSM CEP, Exercise Physiologist    Virtual Visit No    Medication changes reported     No    Fall or balance concerns reported    No    Warm-up and Cool-down Performed on first and last piece of equipment    Resistance Training Performed Yes    VAD Patient? No    PAD/SET Patient? No      Pain Assessment   Currently in Pain? No/denies                Social History   Tobacco Use  Smoking Status Former   Packs/day: 1.00   Years: 30.00   Pack years: 30.00   Types: Cigarettes   Quit date: 03/07/1994   Years since quitting: 27.1  Smokeless Tobacco Never    Goals Met:  Independence with exercise equipment Exercise tolerated well No report of cardiac concerns or symptoms Strength training completed today  Goals Unmet:  Not Applicable  Comments: Pt able to follow exercise prescription today without complaint.  Will continue to monitor for progression.    Dr. Emily Filbert is Medical Director for [place].  Dr. Ottie Glazier is Medical Director for [facility] Pulmonary Rehabilitation.

## 2021-05-10 ENCOUNTER — Encounter: Payer: Medicare HMO | Admitting: *Deleted

## 2021-05-10 ENCOUNTER — Other Ambulatory Visit: Payer: Self-pay

## 2021-05-10 DIAGNOSIS — J449 Chronic obstructive pulmonary disease, unspecified: Secondary | ICD-10-CM

## 2021-05-10 NOTE — Progress Notes (Signed)
Daily Session Note  Patient Details  Name: Bradley Bowers MRN: [id number] Date of Birth: [birth year] Referring Provider:   April Manson Pulmonary Rehab from 04/05/2021 in [facility] Cardiac and Pulmonary Rehab  Referring Provider Emily Filbert MD       Encounter Date: 05/10/2021  Check In:  Session Check In - 05/10/21 1115       Check-In   Supervising physician immediately available to respond to emergencies See telemetry face sheet for immediately available ER MD    Location [facility]-Cardiac & Pulmonary Rehab    Staff Present Renita Papa, RN BSN;Joseph Delaplaine, RCP,RRT,BSRT;Melissa Palacios, [place], Rowe Pavy, BA, ACSM CEP, Exercise Physiologist    Virtual Visit No    Medication changes reported     No    Fall or balance concerns reported    No    Warm-up and Cool-down Performed on first and last piece of equipment    Resistance Training Performed Yes    VAD Patient? No    PAD/SET Patient? No      Pain Assessment   Currently in Pain? No/denies                Social History   Tobacco Use  Smoking Status Former   Packs/day: 1.00   Years: 30.00   Pack years: 30.00   Types: Cigarettes   Quit date: 03/07/1994   Years since quitting: 27.1  Smokeless Tobacco Never    Goals Met:  Independence with exercise equipment Exercise tolerated well No report of cardiac concerns or symptoms Strength training completed today  Goals Unmet:  Not Applicable  Comments: Pt able to follow exercise prescription today without complaint.  Will continue to monitor for progression.    Dr. Emily Filbert is Medical Director for [place].  Dr. Ottie Glazier is Medical Director for [facility] Pulmonary Rehabilitation.

## 2021-05-15 ENCOUNTER — Other Ambulatory Visit: Payer: Self-pay

## 2021-05-15 DIAGNOSIS — J449 Chronic obstructive pulmonary disease, unspecified: Secondary | ICD-10-CM

## 2021-05-15 NOTE — Progress Notes (Signed)
Daily Session Note  Patient Details  Name: Bradley Bowers MRN: [id number] Date of Birth: 08-07-1946 Referring Provider:   April Manson Pulmonary Rehab from 04/05/2021 in [facility] Cardiac and Pulmonary Rehab  Referring Provider Emily Filbert MD       Encounter Date: 05/15/2021  Check In:  Session Check In - 05/15/21 0944       Check-In   Supervising physician immediately available to respond to emergencies See telemetry face sheet for immediately available ER MD    Location [facility]-Cardiac & Pulmonary Rehab    Staff Present Birdie Sons, MPA, RN;Jessica Luan Pulling, MA, RCEP, CCRP, CCET;Amanda Sommer, BA, ACSM CEP, Exercise Physiologist    Virtual Visit No    Medication changes reported     No    Fall or balance concerns reported    No    Warm-up and Cool-down Performed on first and last piece of equipment    Resistance Training Performed Yes    VAD Patient? No    PAD/SET Patient? No      Pain Assessment   Currently in Pain? No/denies                Social History   Tobacco Use  Smoking Status Former   Packs/day: 1.00   Years: 30.00   Pack years: 30.00   Types: Cigarettes   Quit date: 03/07/1994   Years since quitting: 27.2  Smokeless Tobacco Never    Goals Met:  Independence with exercise equipment Exercise tolerated well No report of cardiac concerns or symptoms Strength training completed today  Goals Unmet:  Not Applicable  Comments: Pt able to follow exercise prescription today without complaint.  Will continue to monitor for progression.    Dr. Emily Filbert is Medical Director for [place].  Dr. Ottie Glazier is Medical Director for [facility] Pulmonary Rehabilitation.

## 2021-05-15 NOTE — Progress Notes (Signed)
Completed initial RD consultation ?

## 2021-05-16 ENCOUNTER — Encounter: Payer: Self-pay | Admitting: *Deleted

## 2021-05-16 DIAGNOSIS — J449 Chronic obstructive pulmonary disease, unspecified: Secondary | ICD-10-CM

## 2021-05-16 NOTE — Progress Notes (Signed)
Pulmonary Individual Treatment Plan  Patient Details  Name: Bradley Bowers MRN: [id number] Date of Birth: 11-09-1945 Referring Provider:   April Manson Pulmonary Rehab from 04/05/2021 in [facility] Cardiac and Pulmonary Rehab  Referring Provider Emily Filbert MD       Initial Encounter Date:  Flowsheet Row Pulmonary Rehab from 04/05/2021 in [facility] and Pulmonary Rehab  Date 04/05/21       Visit Diagnosis: Chronic obstructive pulmonary disease, unspecified COPD type ([place])  Patient's Home Medications on Admission:  Current Outpatient Medications:    ADVAIR DISKUS 100-50 MCG/ACT AEPB, , Disp: , Rfl:    albuterol (VENTOLIN HFA) 108 (90 Base) MCG/ACT inhaler, Inhale into the lungs., Disp: , Rfl:    aspirin 81 MG EC tablet, TAKE 1 TABLET (81 MG TOTAL) BY MOUTH ONCE DAILY. (Patient not taking: Reported on 03/26/2021), Disp: , Rfl: 11   aspirin 81 MG EC tablet, Take by mouth., Disp: , Rfl:    atenolol (TENORMIN) 50 MG tablet, TAKE 1 TABLET (50 MG TOTAL) BY MOUTH ONCE DAILY., Disp: , Rfl: 1   atenolol (TENORMIN) 50 MG tablet, Take by mouth. (Patient not taking: Reported on 03/26/2021), Disp: , Rfl:    atorvastatin (LIPITOR) 80 MG tablet, TAKE 1 TABLET (80 MG TOTAL) BY MOUTH ONCE DAILY. (Patient not taking: Reported on 03/26/2021), Disp: , Rfl: 1   atorvastatin (LIPITOR) 80 MG tablet, Take by mouth., Disp: , Rfl:    Calcium-Magnesium-Vitamin D (CITRACAL CALCIUM+D) [id number] MG-MG-UNIT TB24, Take by mouth. (Patient not taking: Reported on 03/26/2021), Disp: , Rfl:    Calcium-Magnesium-Vitamin D 600-40-500 MG-MG-UNIT TB24, Take by mouth., Disp: , Rfl:    carbamazepine (CARBATROL) 200 MG 12 hr capsule, , Disp: , Rfl:    carbamazepine (TEGRETOL) 200 MG tablet, Take 1 tablet by mouth 2 (two) times daily., Disp: , Rfl:    divalproex (DEPAKOTE ER) 500 MG 24 hr tablet, Take 3 tablets (1,500 mg total) by mouth at bedtime., Disp: 90 tablet, Rfl: 1   donepezil (ARICEPT) 5 MG tablet, Take by mouth., Disp:  , Rfl:    fluticasone (FLONASE) 50 MCG/ACT nasal spray, 1 spray by Each Nare route daily. (Patient not taking: Reported on 03/26/2021), Disp: , Rfl:    fluticasone (FLONASE) 50 MCG/ACT nasal spray, Place into the nose. (Patient not taking: Reported on 03/26/2021), Disp: , Rfl:    fluticasone furoate-vilanterol (BREO ELLIPTA) 100-25 MCG/INH AEPB, Inhale into the lungs., Disp: , Rfl:    Fluticasone-Salmeterol (ADVAIR) 250-50 MCG/DOSE AEPB, Inhale into the lungs., Disp: , Rfl:    gabapentin (NEURONTIN) 300 MG capsule, Take by mouth., Disp: , Rfl:    lisinopril (PRINIVIL,ZESTRIL) 5 MG tablet, TAKE 1 TABLET (5 MG TOTAL) BY MOUTH ONCE DAILY. (Patient not taking: Reported on 03/26/2021), Disp: , Rfl: 11   lisinopril (ZESTRIL) 5 MG tablet, Take 1 tablet by mouth daily., Disp: , Rfl:    methocarbamol (ROBAXIN) 500 MG tablet, Take 500 mg by mouth 3 (three) times daily as needed., Disp: , Rfl: 1   methocarbamol (ROBAXIN) 500 MG tablet, Take by mouth., Disp: , Rfl:    montelukast (SINGULAIR) 10 MG tablet, Take 10 mg by mouth at bedtime., Disp: , Rfl:    OLANZapine (ZYPREXA) 10 MG tablet, Take 1 tablet (10 mg total) by mouth at bedtime., Disp: 30 tablet, Rfl: 1   OLANZapine (ZYPREXA) 5 MG tablet, Take by mouth., Disp: , Rfl:    omeprazole (PRILOSEC) 40 MG capsule, TAKE 1 CAPSULE (40 MG TOTAL) BY MOUTH 2 (TWO) TIMES  DAILY. (Patient not taking: Reported on 03/26/2021), Disp: , Rfl: 3   omeprazole (PRILOSEC) 40 MG capsule, Take 1 capsule by mouth 2 (two) times daily., Disp: , Rfl:    ticagrelor (BRILINTA) 90 MG TABS tablet, Take by mouth., Disp: , Rfl:    timolol (TIMOPTIC) 0.5 % ophthalmic solution, USE 1 DROP(S) IN BOTH EYES ONCE A DAY, Disp: , Rfl: 5   tiotropium (SPIRIVA) 18 MCG inhalation capsule, Place into inhaler and inhale. (Patient not taking: Reported on 03/26/2021), Disp: , Rfl:    tiotropium (SPIRIVA) 18 MCG inhalation capsule, Place into inhaler and inhale., Disp: , Rfl:    Travoprost, BAK Free,  (TRAVATAN) 0.004 % SOLN ophthalmic solution, Frequency:PHARMDIR   Dosage:0.0     Instructions:  Note:1 gtt in each eye q pm Dose: 1, Disp: , Rfl:    vitamin B-12 1000 MCG tablet, Take 1 tablet (1,000 mcg total) by mouth daily., Disp: 30 tablet, Rfl: 1  Past Medical History: Past Medical History:  Diagnosis Date   Arterial vascular disease 10/24/2014   Overview:  MI 1994, small-vessel disease requiring coumadin    Asthma    Centrilobular emphysema ([place]) 10/24/2014   Overview:  2L O2 prn    Centrilobular emphysema ([facility])    Chronic obstructive pulmonary disease ([place]) 11/06/2015   Coronary artery disease    GERD (gastroesophageal reflux disease)    Glaucoma    Heart attack ([place]) 2/17, 1994   Heart disease    Hyperlipemia    Hypertension    Restless leg 10/24/2014   Sleep apnea     Tobacco Use: Social History   Tobacco Use  Smoking Status Former   Packs/day: 1.00   Years: 30.00   Pack years: 30.00   Types: Cigarettes   Quit date: 03/07/1994   Years since quitting: 27.2  Smokeless Tobacco Never    Labs: Recent Review Scientist, physiological     Labs for ITP Cardiac and Pulmonary Rehab Latest Ref Rng & Units 09/24/2016   Cholestrol 0 - 200 mg/dL 177   LDLCALC 0 - 99 mg/dL 111(H)   HDL >40 mg/dL 41   Trlycerides <150 mg/dL 126   Hemoglobin A1c 4.8 - 5.6 % 5.2        Pulmonary Assessment Scores:  Pulmonary Assessment Scores     Row Name 04/05/21 0945         ADL UCSD   ADL Phase Entry     SOB Score total 19     Rest 0     Walk 2     Stairs 3     Bath 0     Dress 1     Shop 0           CAT Score     CAT Score 16           mMRC Score     mMRC Score 2             UCSD: Self-administered rating of dyspnea associated with activities of daily living (ADLs) 6-point scale (0 = "not at all" to 5 = "maximal or unable to do because of breathlessness")  Scoring Scores range from 0 to 120.  Minimally important difference is 5 units  CAT: CAT can identify the health  impairment of COPD patients and is better correlated with disease progression.  CAT has a scoring range of zero to 40. The CAT score is classified into four groups of low (less than 10), medium (10 - 20),  high (21-30) and very high (31-40) based on the impact level of disease on health status. A CAT score over 10 suggests significant symptoms.  A worsening CAT score could be explained by an exacerbation, poor medication adherence, poor inhaler technique, or progression of COPD or comorbid conditions.  CAT MCID is 2 points  mMRC: mMRC (Modified Medical Research Council) Dyspnea Scale is used to assess the degree of baseline functional disability in patients of respiratory disease due to dyspnea. No minimal important difference is established. A decrease in score of 1 point or greater is considered a positive change.   Pulmonary Function Assessment:  Pulmonary Function Assessment - 03/26/21 1403       Breath   Shortness of Breath Yes;Limiting activity             Exercise Target Goals: Exercise Program Goal: Individual exercise prescription set using results from initial 6 min walk test and THRR while considering  patient's activity barriers and safety.   Exercise Prescription Goal: Initial exercise prescription builds to 30-45 minutes a day of aerobic activity, 2-3 days per week.  Home exercise guidelines will be given to patient during program as part of exercise prescription that the participant will acknowledge.  Education: Aerobic Exercise: - Group verbal and visual presentation on the components of exercise prescription. Introduces F.I.T.T principle from ACSM for exercise prescriptions.  Reviews F.I.T.T. principles of aerobic exercise including progression. Written material given at graduation.   Education: Resistance Exercise: - Group verbal and visual presentation on the components of exercise prescription. Introduces F.I.T.T principle from ACSM for exercise prescriptions   Reviews F.I.T.T. principles of resistance exercise including progression. Written material given at graduation.    Education: Exercise & Equipment Safety: - Individual verbal instruction and demonstration of equipment use and safety with use of the equipment. Flowsheet Row Pulmonary Rehab from 05/10/2021 in [facility] Cardiac and Pulmonary Rehab  Date 03/26/21  Educator [facility]  Instruction Review Code 1- Verbalizes Understanding       Education: Exercise Physiology & General Exercise Guidelines: - Group verbal and written instruction with models to review the exercise physiology of the cardiovascular system and associated critical values. Provides general exercise guidelines with specific guidelines to those with heart or lung disease.    Education: Flexibility, Balance, Mind/Body Relaxation: - Group verbal and visual presentation with interactive activity on the components of exercise prescription. Introduces F.I.T.T principle from ACSM for exercise prescriptions. Reviews F.I.T.T. principles of flexibility and balance exercise training including progression. Also discusses the mind body connection.  Reviews various relaxation techniques to help reduce and manage stress (i.e. Deep breathing, progressive muscle relaxation, and visualization). Balance handout provided to take home. Written material given at graduation. Flowsheet Row Pulmonary Rehab from 05/10/2021 in [facility] Cardiac and Pulmonary Rehab  Date 04/12/21  Educator [facility]  Instruction Review Code 1- Verbalizes Understanding       Activity Barriers & Risk Stratification:  Activity Barriers & Cardiac Risk Stratification - 04/05/21 0944       Activity Barriers & Cardiac Risk Stratification   Activity Barriers Shortness of Breath;Deconditioning;Muscular Weakness             6 Minute Walk:  6 Minute Walk     Row Name 04/05/21 0953         6 Minute Walk   Phase Initial     Distance 400 feet     Walk Time 2 minutes  Stopped early due  to low O2 sats     # of  Rest Breaks 0     MPH 2.27     METS 1.13     RPE 15     Perceived Dyspnea  2     VO2 Peak 3.96     Symptoms Yes (comment)     Comments SOB     Resting HR 84 bpm     Resting BP 132/72     Resting Oxygen Saturation  94 %     Exercise Oxygen Saturation  during 6 min walk 80 %  Stopped early     Max Ex. HR 104 bpm     Max Ex. BP 162/70     2 Minute Post BP 140/70           Interval HR     1 Minute HR 94     2 Minute HR 104     3 Minute HR --  Test stopped early     4 Minute HR --  Test stopped early     5 Minute HR --  Test stopped early     6 Minute HR --  Test stopped early     2 Minute Post HR 88     Interval Heart Rate? Yes           Interval Oxygen     Interval Oxygen? Yes     Baseline Oxygen Saturation % 94 %     1 Minute Oxygen Saturation % 87 %     1 Minute Liters of Oxygen 0 L  RA     2 Minute Oxygen Saturation % 80 %  Test stopped     2 Minute Liters of Oxygen 0 L     3 Minute Liters of Oxygen 0 L     4 Minute Liters of Oxygen 0 L     5 Minute Liters of Oxygen 0 L     6 Minute Liters of Oxygen 0 L     2 Minute Post Oxygen Saturation % 90 %     2 Minute Post Liters of Oxygen 0 L            Oxygen Initial Assessment:  Oxygen Initial Assessment - 04/05/21 0945       Home Oxygen   Home Oxygen Device None    Sleep Oxygen Prescription None    Home Exercise Oxygen Prescription None    Home Resting Oxygen Prescription None    Compliance with Home Oxygen Use Yes      Initial 6 min Walk   Oxygen Used None      Program Oxygen Prescription   Program Oxygen Prescription None      Intervention   Short Term Goals To learn and understand importance of monitoring SPO2 with pulse oximeter and demonstrate accurate use of the pulse oximeter.;To learn and exhibit compliance with exercise, home and travel O2 prescription;To learn and understand importance of maintaining oxygen saturations>88%;To learn and demonstrate proper pursed lip breathing  techniques or other breathing techniques. ;To learn and demonstrate proper use of respiratory medications    Long  Term Goals Exhibits compliance with exercise, home  and travel O2 prescription;Maintenance of O2 saturations>88%;Verbalizes importance of monitoring SPO2 with pulse oximeter and return demonstration;Exhibits proper breathing techniques, such as pursed lip breathing or other method taught during program session;Compliance with respiratory medication;Demonstrates proper use of MDI's             Oxygen Re-Evaluation:  Oxygen Re-Evaluation     Row Name 04/19/21 1103  Program Oxygen Prescription   Program Oxygen Prescription None               Home Oxygen     Home Oxygen Device None       Sleep Oxygen Prescription None       Home Exercise Oxygen Prescription None  Patient approved to drop no less than 83% on RA, per Dr. Sabra Heck       Home Resting Oxygen Prescription None       Compliance with Home Oxygen Use Yes               Goals/Expected Outcomes     Short Term Goals To learn and understand importance of monitoring SPO2 with pulse oximeter and demonstrate accurate use of the pulse oximeter.;To learn and exhibit compliance with exercise, home and travel O2 prescription;To learn and understand importance of maintaining oxygen saturations>88%;To learn and demonstrate proper pursed lip breathing techniques or other breathing techniques. ;To learn and demonstrate proper use of respiratory medications       Long  Term Goals Exhibits compliance with exercise, home  and travel O2 prescription;Maintenance of O2 saturations>88%;Verbalizes importance of monitoring SPO2 with pulse oximeter and return demonstration;Exhibits proper breathing techniques, such as pursed lip breathing or other method taught during program session;Compliance with respiratory medication;Demonstrates proper use of MDI's       Comments Per Dr. Sabra Heck, "He may exercise without O2- stop as he needs  during exercise. Only stop exercise if pulse ox drops less than 83%." Note in chart for documentation.               Oxygen Discharge (Final Oxygen Re-Evaluation):  Oxygen Re-Evaluation - 04/19/21 1103       Program Oxygen Prescription   Program Oxygen Prescription None      Home Oxygen   Home Oxygen Device None    Sleep Oxygen Prescription None    Home Exercise Oxygen Prescription None   Patient approved to drop no less than 83% on RA, per Dr. Sabra Heck   Home Resting Oxygen Prescription None    Compliance with Home Oxygen Use Yes      Goals/Expected Outcomes   Short Term Goals To learn and understand importance of monitoring SPO2 with pulse oximeter and demonstrate accurate use of the pulse oximeter.;To learn and exhibit compliance with exercise, home and travel O2 prescription;To learn and understand importance of maintaining oxygen saturations>88%;To learn and demonstrate proper pursed lip breathing techniques or other breathing techniques. ;To learn and demonstrate proper use of respiratory medications    Long  Term Goals Exhibits compliance with exercise, home  and travel O2 prescription;Maintenance of O2 saturations>88%;Verbalizes importance of monitoring SPO2 with pulse oximeter and return demonstration;Exhibits proper breathing techniques, such as pursed lip breathing or other method taught during program session;Compliance with respiratory medication;Demonstrates proper use of MDI's    Comments Per Dr. Sabra Heck, "He may exercise without O2- stop as he needs during exercise. Only stop exercise if pulse ox drops less than 83%." Note in chart for documentation.             Initial Exercise Prescription:  Initial Exercise Prescription - 04/05/21 0900       Date of Initial Exercise RX and Referring Provider   Date 04/05/21    Referring Provider Emily Filbert MD      Treadmill   MPH 1.7    Grade 0    Minutes 15    METs 2.3      REL-XR  Level 1    Speed 50    Minutes 15     METs 1.1      T5 Nustep   Level 1    SPM 80    Minutes 15    METs 1.1      Prescription Details   Frequency (times per week) 2    Duration Progress to 30 minutes of continuous aerobic without signs/symptoms of physical distress      Intensity   THRR 40-80% of Max Heartrate 109-134    Ratings of Perceived Exertion 11-13    Perceived Dyspnea 0-4      Progression   Progression Continue to progress workloads to maintain intensity without signs/symptoms of physical distress.      Resistance Training   Training Prescription Yes    Weight 3 lb    Reps 10-15             Perform Capillary Blood Glucose checks as needed.  Exercise Prescription Changes:   Exercise Prescription Changes     Row Name 04/05/21 0900 04/26/21 1000 05/08/21 0900         Response to Exercise   Blood Pressure (Admit) 132/72 118/60 110/70     Blood Pressure (Exercise) 162/70 128/72 148/66     Blood Pressure (Exit) 140/70 106/62 132/58     Heart Rate (Admit) 84 bpm 56 bpm 66 bpm     Heart Rate (Exercise) 104 bpm 81 bpm 101 bpm     Heart Rate (Exit) 88 bpm 69 bpm 92 bpm     Oxygen Saturation (Admit) 94 % 91 % 90 %     Oxygen Saturation (Exercise) 80 % 83 % 82 %     Oxygen Saturation (Exit) 94 % 91 % 91 %     Rating of Perceived Exertion (Exercise) '15 15 17     '$ Perceived Dyspnea (Exercise) '2 3 2     '$ Symptoms SOB SOB SOB     Comments walk test results first full day of exercise --     Duration -- Progress to 30 minutes of  aerobic without signs/symptoms of physical distress Progress to 30 minutes of  aerobic without signs/symptoms of physical distress     Intensity -- THRR unchanged THRR unchanged           Progression       Progression -- Continue to progress workloads to maintain intensity without signs/symptoms of physical distress. Continue to progress workloads to maintain intensity without signs/symptoms of physical distress.     Average METs -- 2.17 2.07           Resistance Training        Training Prescription -- Yes Yes     Weight -- 3 lb 3 lb     Reps -- 10-15 10-15           Interval Training       Interval Training -- No No           Treadmill       MPH -- 0.9 1.2     Grade -- 0 0     Minutes -- 15 15     METs -- 1 1.92           Recumbant Bike       Level -- 1 --     Minutes -- 15 --     METs -- 2.84 --           NuStep  Level -- -- 2     Minutes -- -- 15     METs -- -- 2.7           REL-XR       Level -- -- 1     Minutes -- -- 15           T5 Nustep       Level -- 1 1     Minutes -- 15 15     METs -- 2 1.9           Biostep-RELP       Level -- -- 1     Minutes -- -- 15     METs -- -- 2           Track       Laps -- 15 15     Minutes -- 15 15     METs -- 1.82 1.82             Exercise Comments:   Exercise Comments     Row Name 04/12/21 1157           Exercise Comments First full day of exercise!  Patient was oriented to gym and equipment including functions, settings, policies, and procedures.  Patient's individual exercise prescription and treatment plan were reviewed.  All starting workloads were established based on the results of the 6 minute walk test done at initial orientation visit.  The plan for exercise progression was also introduced and progression will be customized based on patient's performance and goals.                Exercise Goals and Review:   Exercise Goals     Row Name 04/05/21 1000             Exercise Goals   Increase Physical Activity Yes       Intervention Provide advice, education, support and counseling about physical activity/exercise needs.;Develop an individualized exercise prescription for aerobic and resistive training based on initial evaluation findings, risk stratification, comorbidities and participant's personal goals.       Expected Outcomes Short Term: Attend rehab on a regular basis to increase amount of physical activity.;Long Term: Add in home exercise to  make exercise part of routine and to increase amount of physical activity.;Long Term: Exercising regularly at least 3-5 days a week.       Increase Strength and Stamina Yes       Intervention Provide advice, education, support and counseling about physical activity/exercise needs.;Develop an individualized exercise prescription for aerobic and resistive training based on initial evaluation findings, risk stratification, comorbidities and participant's personal goals.       Expected Outcomes Short Term: Increase workloads from initial exercise prescription for resistance, speed, and METs.;Short Term: Perform resistance training exercises routinely during rehab and add in resistance training at home;Long Term: Improve cardiorespiratory fitness, muscular endurance and strength as measured by increased METs and functional capacity (6MWT)       Able to understand and use rate of perceived exertion (RPE) scale Yes       Intervention Provide education and explanation on how to use RPE scale       Expected Outcomes Short Term: Able to use RPE daily in rehab to express subjective intensity level;Long Term:  Able to use RPE to guide intensity level when exercising independently       Able to understand and use Dyspnea scale Yes  Intervention Provide education and explanation on how to use Dyspnea scale       Expected Outcomes Short Term: Able to use Dyspnea scale daily in rehab to express subjective sense of shortness of breath during exertion;Long Term: Able to use Dyspnea scale to guide intensity level when exercising independently       Knowledge and understanding of Target Heart Rate Range (THRR) Yes       Intervention Provide education and explanation of THRR including how the numbers were predicted and where they are located for reference       Expected Outcomes Short Term: Able to state/look up THRR;Short Term: Able to use daily as guideline for intensity in rehab;Long Term: Able to use THRR to govern  intensity when exercising independently       Able to check pulse independently Yes       Intervention Provide education and demonstration on how to check pulse in carotid and radial arteries.;Review the importance of being able to check your own pulse for safety during independent exercise       Expected Outcomes Short Term: Able to explain why pulse checking is important during independent exercise;Long Term: Able to check pulse independently and accurately       Understanding of Exercise Prescription Yes       Intervention Provide education, explanation, and written materials on patient's individual exercise prescription       Expected Outcomes Short Term: Able to explain program exercise prescription;Long Term: Able to explain home exercise prescription to exercise independently                Exercise Goals Re-Evaluation :  Exercise Goals Re-Evaluation     Row Name 04/12/21 1157 04/26/21 1033 05/08/21 0924         Exercise Goal Re-Evaluation   Exercise Goals Review Able to understand and use rate of perceived exertion (RPE) scale;Knowledge and understanding of Target Heart Rate Range (THRR);Able to understand and use Dyspnea scale;Understanding of Exercise Prescription Increase Physical Activity;Understanding of Exercise Prescription;Increase Strength and Stamina Increase Physical Activity;Understanding of Exercise Prescription;Increase Strength and Stamina     Comments Reviewed RPE and dyspnea scales, THR and program prescription with pt today.  Pt voiced understanding and was given a copy of goals to take home. Bradley Bowers is doing well the first couple sessions he has been here for rehab. He recently got clearance to maintain O2 sats > 83% per MD as patient wants to progress without oxygen, per doctor notes. Patient has already tolerated 15 laps on the track- he is slowly working up his speed on the treadmill while maintain good oxygen saturation. Will continue to monitor for progression.  Bradley Bowers continues to do well in rehab.   He is doing well at monitoring his oxygen levels closely and will take rest breaks when they drop too low to allow them to recover.  We will continue to montior his progress.     Expected Outcomes Short: Use RPE daily to regulate intensity. Long: Follow program prescription in THR. Short: Increase speed on treadmill as tolerated Long: Continue to build up strength and stamina --              Discharge Exercise Prescription (Final Exercise Prescription Changes):  Exercise Prescription Changes - 05/08/21 0900       Response to Exercise   Blood Pressure (Admit) 110/70    Blood Pressure (Exercise) 148/66    Blood Pressure (Exit) 132/58    Heart Rate (Admit) 66 bpm  Heart Rate (Exercise) 101 bpm    Heart Rate (Exit) 92 bpm    Oxygen Saturation (Admit) 90 %    Oxygen Saturation (Exercise) 82 %    Oxygen Saturation (Exit) 91 %    Rating of Perceived Exertion (Exercise) 17    Perceived Dyspnea (Exercise) 2    Symptoms SOB    Duration Progress to 30 minutes of  aerobic without signs/symptoms of physical distress    Intensity THRR unchanged      Progression   Progression Continue to progress workloads to maintain intensity without signs/symptoms of physical distress.    Average METs 2.07      Resistance Training   Training Prescription Yes    Weight 3 lb    Reps 10-15      Interval Training   Interval Training No      Treadmill   MPH 1.2    Grade 0    Minutes 15    METs 1.92      NuStep   Level 2    Minutes 15    METs 2.7      REL-XR   Level 1    Minutes 15      T5 Nustep   Level 1    Minutes 15    METs 1.9      Biostep-RELP   Level 1    Minutes 15    METs 2      Track   Laps 15    Minutes 15    METs 1.82             Nutrition:  Target Goals: Understanding of nutrition guidelines, daily intake of sodium '1500mg'$ , cholesterol '200mg'$ , calories 30% from fat and 7% or less from saturated fats, daily to have 5 or  more servings of fruits and vegetables.  Education: All About Nutrition: -Group instruction provided by verbal, written material, interactive activities, discussions, models, and posters to present general guidelines for heart healthy nutrition including fat, fiber, MyPlate, the role of sodium in heart healthy nutrition, utilization of the nutrition label, and utilization of this knowledge for meal planning. Follow up email sent as well. Written material given at graduation. Flowsheet Row Pulmonary Rehab from 05/10/2021 in [facility]  Date 05/01/21  Educator [facility]  Instruction Review Code 1- Verbalizes Understanding       Biometrics:  Pre Biometrics - 04/05/21 0944       Pre Biometrics   Height 5' 6.75" (1.695 m)    Weight 208 lb (94.3 kg)    BMI (Calculated) 32.84    Single Leg Stand 1.64 seconds              Nutrition Therapy Plan and Nutrition Goals:  Nutrition Therapy & Goals - 05/15/21 0914       Nutrition Therapy   Diet Heart healthy, low Na, pulmonary MNT    Drug/Food Interactions Statins/Certain Fruits    Protein (specify units) 110g    Fiber 30 grams    Whole Grain Foods 3 servings    Saturated Fats 12 max. grams    Fruits and Vegetables 8 servings/day    Sodium 1.5 grams      Personal Nutrition Goals   Nutrition Goal ST: add protein to breakfast (nuts/seeds, boiled egg) LT: maintain changes made after heart event, meet protein and calorie needs    Comments He is trying to eat more fruit and vegetables. B: cereal (high in fiber) - 2 % milk L: banana  or apple or yogurt - greek (with nuts and fruit) D: poultry, roast beef, vegetables (vegetable oil or chicken broth (low Na)) - does not add salt  S: ice cream sometimes. Drinks: water or fruit drink or diet sprite (trying to cut these out). Patient reports making many changes post heart event. Discussed heart healthy eating and pulmonary MNT.      Intervention Plan   Intervention Prescribe,  educate and counsel regarding individualized specific dietary modifications aiming towards targeted core components such as weight, hypertension, lipid management, diabetes, heart failure and other comorbidities.;Nutrition handout(s) given to patient.    Expected Outcomes Short Term Goal: Understand basic principles of dietary content, such as calories, fat, sodium, cholesterol and nutrients.;Short Term Goal: A plan has been developed with personal nutrition goals set during dietitian appointment.;Long Term Goal: Adherence to prescribed nutrition plan.             Nutrition Assessments:  MEDIFICTS Score Key: ?70 Need to make dietary changes  40-70 Heart Healthy Diet ? 40 Therapeutic Level Cholesterol Diet  Flowsheet Row Pulmonary Rehab from 04/05/2021 in [facility] Pul[facility] Re[facility]  Picture Your Plate Total Score on Admission 58      Picture Your Plate Scores: [id number] Unhealthy dietary pattern with much room for improvement. 41-50 Dietary pattern unlikely to meet recommendations for good health and room for improvement. 51-60 More healthful dietary pattern, with some room for improvement.  >60 Healthy dietary pattern, although there may be some specific behaviors that could be improved.   Nutrition Goals Re-Evaluation:  Nutrition Goals Re-Evaluation     [place] Name 05/10/21 1003             Goals   Comment Bill deferred RD appointment last month but has scheduled time for next week.                Nutrition Goals Discharge (Final Nutrition Goals Re-Evaluation):  Nutrition Goals Re-Evaluation - 05/10/21 1003       Goals   Comment Bill deferred RD appointment last month but has scheduled time for next week.             Psychosocial: Target Goals: Acknowledge presence or absence of significant depression and/or stress, maximize coping skills, provide positive support system. Participant is able to verbalize types and ability to use techniques and skills needed for  reducing stress and depression.   Education: Stress, Anxiety, and Depression - Group verbal and visual presentation to define topics covered.  Reviews how body is impacted by stress, anxiety, and depression.  Also discusses healthy ways to reduce stress and to treat/manage anxiety and depression.  Written material given at graduation.   Education: Sleep Hygiene -Provides group verbal and written instruction about how sleep can affect your health.  Define sleep hygiene, discuss sleep cycles and impact of sleep habits. Review good sleep hygiene tips.    Initial Review & Psychosocial Screening:  Initial Psych Review & Screening - 03/26/21 1404       Initial Review   Current issues with Current Psychotropic Meds;Current Anxiety/Panic      Family Dynamics   Good Support System? Yes    Comments His anxiety stems from COVID and his shortness of breath. He can look to his wife and nephew for support.      Barriers   Psychosocial barriers to participate in program The patient should benefit from training in stress management and relaxation.      Screening Interventions   Interventions Encouraged to  exercise;To provide support and resources with identified psychosocial needs;Provide feedback about the scores to participant    Expected Outcomes Short Term goal: Utilizing psychosocial counselor, staff and physician to assist with identification of specific Stressors or current issues interfering with healing process. Setting desired goal for each stressor or current issue identified.;Long Term Goal: Stressors or current issues are controlled or eliminated.;Short Term goal: Identification and review with participant of any Quality of Life or Depression concerns found by scoring the questionnaire.;Long Term goal: The participant improves quality of Life and PHQ9 Scores as seen by post scores and/or verbalization of changes             Quality of Life Scores:  Scores of 19 and below usually  indicate a poorer quality of life in these areas.  A difference of  2-3 points is a clinically meaningful difference.  A difference of 2-3 points in the total score of the Quality of Life Index has been associated with significant improvement in overall quality of life, self-image, physical symptoms, and general health in studies assessing change in quality of life.  PHQ-9: Recent Review Flowsheet Data     Depression screen [facility] 2/9 04/05/2021   Decreased Interest 0   Down, Depressed, Hopeless 0   PHQ - 2 Score 0   Altered sleeping 1   Tired, decreased energy 2   Change in appetite 1   Feeling bad or failure about yourself  0   Trouble concentrating 0   Moving slowly or fidgety/restless 0   Suicidal thoughts 0   PHQ-9 Score 4   Difficult doing work/chores Not difficult at all      Interpretation of Total Score  Total Score Depression Severity:  1-4 = Minimal depression, 5-9 = Mild depression, 10-14 = Moderate depression, 15-19 = Moderately severe depression, 20-27 = Severe depression   Psychosocial Evaluation and Intervention:   Psychosocial Re-Evaluation:  Psychosocial Re-Evaluation     [place] Name 05/10/21 1001             Psychosocial Re-Evaluation   Comments Bill reports no stress anxiety or depression.  He sleeps 7-8 hours and sleeps well.       Expected Outcomes Short: notify staff of any changes in symptoms Long:  maintain positive outlook                Psychosocial Discharge (Final Psychosocial Re-Evaluation):  Psychosocial Re-Evaluation - 05/10/21 1001       Psychosocial Re-Evaluation   Comments Bill reports no stress anxiety or depression.  He sleeps 7-8 hours and sleeps well.    Expected Outcomes Short: notify staff of any changes in symptoms Long:  maintain positive outlook             Education: Education Goals: Education classes will be provided on a weekly basis, covering required topics. Participant will state understanding/return  demonstration of topics presented.  Learning Barriers/Preferences:  Learning Barriers/Preferences - 03/26/21 1403       Learning Barriers/Preferences   Learning Barriers None    Learning Preferences None             General Pulmonary Education Topics:  Infection Prevention: - Provides verbal and written material to individual with discussion of infection control including proper hand washing and proper equipment cleaning during exercise session. Flowsheet Row Pulmonary Rehab from 05/10/2021 in [facility] Cardiac and Pulmonary Rehab  Date 03/26/21  Educator [facility]  Instruction Review Code 1- Verbalizes Understanding       Falls Prevention: -  Provides verbal and written material to individual with discussion of falls prevention and safety. Flowsheet Row Pulmonary Rehab from 05/10/2021 in [facility] Cardiac and Pulmonary Rehab  Date 03/26/21  Educator [facility]  Instruction Review Code 1- Verbalizes Understanding       Chronic Lung Disease Review: - Group verbal instruction with posters, models, PowerPoint presentations and videos,  to review new updates, new respiratory medications, new advancements in procedures and treatments. Providing information on websites and "800" numbers for continued self-education. Includes information about supplement oxygen, available portable oxygen systems, continuous and intermittent flow rates, oxygen safety, concentrators, and Medicare reimbursement for oxygen. Explanation of Pulmonary Drugs, including class, frequency, complications, importance of spacers, rinsing mouth after steroid MDI's, and proper cleaning methods for nebulizers. Review of basic lung anatomy and physiology related to function, structure, and complications of lung disease. Review of risk factors. Discussion about methods for diagnosing sleep apnea and types of masks and machines for OSA. Includes a review of the use of types of environmental controls: home humidity, furnaces, filters, dust mite/pet  prevention, HEPA vacuums. Discussion about weather changes, air quality and the benefits of nasal washing. Instruction on Warning signs, infection symptoms, calling MD promptly, preventive modes, and value of vaccinations. Review of effective airway clearance, coughing and/or vibration techniques. Emphasizing that all should Create an Action Plan. Written material given at graduation. Flowsheet Row Pulmonary Rehab from 05/10/2021 in [facility] Cardiac and Pulmonary Rehab  Date 05/10/21  Educator [facility]  Instruction Review Code 1- Verbalizes Understanding       AED/CPR: - Group verbal and written instruction with the use of models to demonstrate the basic use of the AED with the basic ABC's of resuscitation.    Anatomy and Cardiac Procedures: - Group verbal and visual presentation and models provide information about basic cardiac anatomy and function. Reviews the testing methods done to diagnose heart disease and the outcomes of the test results. Describes the treatment choices: Medical Management, Angioplasty, or Coronary Bypass Surgery for treating various heart conditions including Myocardial Infarction, Angina, Valve Disease, and Cardiac Arrhythmias.  Written material given at graduation.   Medication Safety: - Group verbal and visual instruction to review commonly prescribed medications for heart and lung disease. Reviews the medication, class of the drug, and side effects. Includes the steps to properly store meds and maintain the prescription regimen.  Written material given at graduation. Flowsheet Row Pulmonary Rehab from 05/10/2021 in [facility] Cardiac and Pulmonary Rehab  Date 04/26/21  Educator SB  Instruction Review Code 1- Verbalizes Understanding       Other: -Provides group and verbal instruction on various topics (see comments)   Knowledge Questionnaire Score:  Knowledge Questionnaire Score - 04/05/21 0947       Knowledge Questionnaire Score   Pre Score 16/18: Oxygen sats, Oxygen  usage              Core Components/Risk Factors/Patient Goals at Admission:  Personal Goals and Risk Factors at Admission - 04/05/21 1000       Core Components/Risk Factors/Patient Goals on Admission    Weight Management Yes;Weight Loss    Intervention Weight Management: Develop a combined nutrition and exercise program designed to reach desired caloric intake, while maintaining appropriate intake of nutrient and fiber, sodium and fats, and appropriate energy expenditure required for the weight goal.;Weight Management: Provide education and appropriate resources to help participant work on and attain dietary goals.;Weight Management/Obesity: Establish reasonable short term and long term weight goals.    Admit Weight 208  lb (94.3 kg)    Goal Weight: Short Term 203 lb (92.1 kg)    Goal Weight: Long Term 195 lb (88.5 kg)    Expected Outcomes Short Term: Continue to assess and modify interventions until short term weight is achieved;Weight Loss: Understanding of general recommendations for a balanced deficit meal plan, which promotes 1-2 lb weight loss per week and includes a negative energy balance of [phone] kcal/d;Long Term: Adherence to nutrition and physical activity/exercise program aimed toward attainment of established weight goal;Understanding recommendations for meals to include 15-35% energy as protein, 25-35% energy from fat, 35-60% energy from carbohydrates, less than '200mg'$  of dietary cholesterol, 20-35 gm of total fiber daily;Understanding of distribution of calorie intake throughout the day with the consumption of 4-5 meals/snacks    Improve shortness of breath with ADL's Yes    Intervention Provide education, individualized exercise plan and daily activity instruction to help decrease symptoms of SOB with activities of daily living.    Expected Outcomes Short Term: Improve cardiorespiratory fitness to achieve a reduction of symptoms when performing ADLs;Long Term: Be able to perform  more ADLs without symptoms or delay the onset of symptoms    Hypertension Yes    Intervention Provide education on lifestyle modifcations including regular physical activity/exercise, weight management, moderate sodium restriction and increased consumption of fresh fruit, vegetables, and low fat dairy, alcohol moderation, and smoking cessation.;Monitor prescription use compliance.    Expected Outcomes Short Term: Continued assessment and intervention until BP is < 140/41m HG in hypertensive participants. < 130/830mHG in hypertensive participants with diabetes, heart failure or chronic kidney disease.;Long Term: Maintenance of blood pressure at goal levels.    Lipids Yes    Intervention Provide education and support for participant on nutrition & aerobic/resistive exercise along with prescribed medications to achieve LDL '70mg'$ , HDL >'40mg'$ .    Expected Outcomes Short Term: Participant states understanding of desired cholesterol values and is compliant with medications prescribed. Participant is following exercise prescription and nutrition guidelines.;Long Term: Cholesterol controlled with medications as prescribed, with individualized exercise RX and with personalized nutrition plan. Value goals: LDL < '70mg'$ , HDL > 40 mg.             Education:Diabetes - Individual verbal and written instruction to review signs/symptoms of diabetes, desired ranges of glucose level fasting, after meals and with exercise. Acknowledge that pre and post exercise glucose checks will be done for 3 sessions at entry of program.   Know Your Numbers and Heart Failure: - Group verbal and visual instruction to discuss disease risk factors for cardiac and pulmonary disease and treatment options.  Reviews associated critical values for Overweight/Obesity, Hypertension, Cholesterol, and Diabetes.  Discusses basics of heart failure: signs/symptoms and treatments.  Introduces Heart Failure Zone chart for action plan for heart  failure.  Written material given at graduation. [facility]  Date 05/03/21  Educator SB  Instruction Review Code 1- Verbalizes Understanding       Core Components/Risk Factors/Patient Goals Review:   Goals and Risk Factor Review     Row Name 05/10/21 0957             Core Components/Risk Factors/Patient Goals Review   Personal Goals Review Weight Management/Obesity;Improve shortness of breath with ADL's;Hypertension       Review BiRush Landmarkeels he is making progress.  He is able to do about 10 min total now.  He is able to do most ADLs without trouble except  walking to his car.  He is in his third week of sessions.  He reports taking all medications as directed.  He does monitor oxygen at home.  He doesnt monitor BP at home.  His weight is down some today. He can tell his pants fit looser.       Expected Outcomes Short:  continue to check oxygen at home and work on walking Long:  manage risk factors                Core Components/Risk Factors/Patient Goals at Discharge (Final Review):   Goals and Risk Factor Review - 05/10/21 0957       Core Components/Risk Factors/Patient Goals Review   Personal Goals Review Weight Management/Obesity;Improve shortness of breath with ADL's;Hypertension    Review Bradley Bowers feels he is making progress.  He is able to do about 10 min total now.  He is able to do most ADLs without trouble except walking to his car.  He is in his third week of sessions.  He reports taking all medications as directed.  He does monitor oxygen at home.  He doesnt monitor BP at home.  His weight is down some today. He can tell his pants fit looser.    Expected Outcomes Short:  continue to check oxygen at home and work on walking Long:  manage risk factors             ITP Comments:  ITP Comments     Row Name 03/26/21 1402 04/05/21 0940 04/12/21 1157 04/18/21 1203 04/19/21 1054   ITP Comments Virtual Visit  completed. Patient informed on EP and RD appointment and 6 Minute walk test. Patient also informed of patient health questionnaires on My Chart. Patient Verbalizes understanding. Visit diagnosis can be found in [facility] 02/28/2021. Completed 6MWT and gym orientation. Initial ITP created and sent for review to Dr. Ottie Glazier, Medical Director. First full day of exercise!  Patient was oriented to gym and equipment including functions, settings, policies, and procedures.  Patient's individual exercise prescription and treatment plan were reviewed.  All starting workloads were established based on the results of the 6 minute walk test done at initial orientation visit.  The plan for exercise progression was also introduced and progression will be customized based on patient's performance and goals. 30 Day review completed. Medical Director ITP review done, changes made as directed, and signed approval by Medical Director. Patient oxygen saturations dropped routinely throughout rehab. Patient does not want to use oxygen, per Dr. Sabra Heck, "He may exercise without O2- stop as he needs during exercise, only stop exercise if pulse ox drops less than 83%." Staff has doctor's orders note with chart for documentation.    [place] Name 05/15/21 0902 05/16/21 0829         ITP Comments Completed initial RD consultation 30 Day review completed. Medical Director ITP review done, changes made as directed, and signed approval by Medical Director.               Comments:

## 2021-05-17 DIAGNOSIS — I7789 Other specified disorders of arteries and arterioles: Secondary | ICD-10-CM | POA: Diagnosis not present

## 2021-05-17 DIAGNOSIS — E785 Hyperlipidemia, unspecified: Secondary | ICD-10-CM | POA: Diagnosis not present

## 2021-05-17 DIAGNOSIS — J432 Centrilobular emphysema: Secondary | ICD-10-CM | POA: Diagnosis not present

## 2021-05-22 ENCOUNTER — Other Ambulatory Visit: Payer: Self-pay

## 2021-05-22 ENCOUNTER — Inpatient Hospital Stay: Payer: Medicare HMO

## 2021-05-22 ENCOUNTER — Inpatient Hospital Stay
Admission: EM | Admit: 2021-05-22 | Discharge: 2021-05-25 | DRG: 291 | Disposition: A | Discharge status: Home / Self Care | Payer: Medicare HMO | Attending: Obstetrics and Gynecology | Admitting: Obstetrics and Gynecology

## 2021-05-22 ENCOUNTER — Emergency Department: Payer: Medicare HMO

## 2021-05-22 DIAGNOSIS — Z955 Presence of coronary angioplasty implant and graft: Secondary | ICD-10-CM

## 2021-05-22 DIAGNOSIS — F0390 Unspecified dementia without behavioral disturbance: Secondary | ICD-10-CM | POA: Diagnosis not present

## 2021-05-22 DIAGNOSIS — I2510 Atherosclerotic heart disease of native coronary artery without angina pectoris: Secondary | ICD-10-CM | POA: Diagnosis present

## 2021-05-22 DIAGNOSIS — G629 Polyneuropathy, unspecified: Secondary | ICD-10-CM | POA: Diagnosis present

## 2021-05-22 DIAGNOSIS — E785 Hyperlipidemia, unspecified: Secondary | ICD-10-CM

## 2021-05-22 DIAGNOSIS — I509 Heart failure, unspecified: Secondary | ICD-10-CM

## 2021-05-22 DIAGNOSIS — N182 Chronic kidney disease, stage 2 (mild): Secondary | ICD-10-CM | POA: Diagnosis present

## 2021-05-22 DIAGNOSIS — Z79899 Other long term (current) drug therapy: Secondary | ICD-10-CM | POA: Diagnosis not present

## 2021-05-22 DIAGNOSIS — Z7982 Long term (current) use of aspirin: Secondary | ICD-10-CM | POA: Diagnosis not present

## 2021-05-22 DIAGNOSIS — N289 Disorder of kidney and ureter, unspecified: Secondary | ICD-10-CM | POA: Diagnosis not present

## 2021-05-22 DIAGNOSIS — I252 Old myocardial infarction: Secondary | ICD-10-CM | POA: Diagnosis not present

## 2021-05-22 DIAGNOSIS — Z87891 Personal history of nicotine dependence: Secondary | ICD-10-CM | POA: Diagnosis not present

## 2021-05-22 DIAGNOSIS — I5033 Acute on chronic diastolic (congestive) heart failure: Secondary | ICD-10-CM | POA: Diagnosis present

## 2021-05-22 DIAGNOSIS — Z20822 Contact with and (suspected) exposure to covid-19: Secondary | ICD-10-CM | POA: Diagnosis present

## 2021-05-22 DIAGNOSIS — J441 Chronic obstructive pulmonary disease with (acute) exacerbation: Secondary | ICD-10-CM | POA: Diagnosis not present

## 2021-05-22 DIAGNOSIS — R600 Localized edema: Secondary | ICD-10-CM

## 2021-05-22 DIAGNOSIS — R609 Edema, unspecified: Secondary | ICD-10-CM | POA: Diagnosis not present

## 2021-05-22 DIAGNOSIS — F319 Bipolar disorder, unspecified: Secondary | ICD-10-CM

## 2021-05-22 DIAGNOSIS — I130 Hypertensive heart and chronic kidney disease with heart failure and stage 1 through stage 4 chronic kidney disease, or unspecified chronic kidney disease: Principal | ICD-10-CM | POA: Diagnosis present

## 2021-05-22 DIAGNOSIS — Z7951 Long term (current) use of inhaled steroids: Secondary | ICD-10-CM | POA: Diagnosis not present

## 2021-05-22 DIAGNOSIS — R0902 Hypoxemia: Secondary | ICD-10-CM

## 2021-05-22 DIAGNOSIS — M7989 Other specified soft tissue disorders: Secondary | ICD-10-CM | POA: Diagnosis not present

## 2021-05-22 DIAGNOSIS — R0602 Shortness of breath: Secondary | ICD-10-CM | POA: Diagnosis not present

## 2021-05-22 DIAGNOSIS — G4733 Obstructive sleep apnea (adult) (pediatric): Secondary | ICD-10-CM | POA: Diagnosis not present

## 2021-05-22 DIAGNOSIS — R0600 Dyspnea, unspecified: Secondary | ICD-10-CM

## 2021-05-22 DIAGNOSIS — K219 Gastro-esophageal reflux disease without esophagitis: Secondary | ICD-10-CM | POA: Diagnosis present

## 2021-05-22 DIAGNOSIS — J449 Chronic obstructive pulmonary disease, unspecified: Secondary | ICD-10-CM | POA: Diagnosis not present

## 2021-05-22 DIAGNOSIS — J9601 Acute respiratory failure with hypoxia: Secondary | ICD-10-CM | POA: Diagnosis present

## 2021-05-22 DIAGNOSIS — I110 Hypertensive heart disease with heart failure: Secondary | ICD-10-CM | POA: Diagnosis not present

## 2021-05-22 LAB — COMPREHENSIVE METABOLIC PANEL
ALT: 43 U/L (ref 0–44)
AST: 29 U/L (ref 15–41)
Albumin: 3.6 g/dL (ref 3.5–5.0)
Alkaline Phosphatase: 92 U/L (ref 38–126)
Anion gap: 9 (ref 5–15)
BUN: 27 mg/dL — ABNORMAL HIGH (ref 8–23)
CO2: 31 mmol/L (ref 22–32)
Calcium: 8.9 mg/dL (ref 8.9–10.3)
Chloride: 96 mmol/L — ABNORMAL LOW (ref 98–111)
Creatinine, Ser: 1.37 mg/dL — ABNORMAL HIGH (ref 0.61–1.24)
GFR, Estimated: 54 mL/min — ABNORMAL LOW (ref >60)
Glucose, Bld: 107 mg/dL — ABNORMAL HIGH (ref 70–99)
Potassium: 5 mmol/L (ref 3.5–5.1)
Sodium: 136 mmol/L (ref 135–145)
Total Bilirubin: 0.7 mg/dL (ref 0.3–1.2)
Total Protein: 7.3 g/dL (ref 6.5–8.1)

## 2021-05-22 LAB — CBC WITH DIFFERENTIAL/PLATELET
Abs Immature Granulocytes: 0.04 10*3/uL (ref 0.00–0.07)
Basophils Absolute: 0 10*3/uL (ref 0.0–0.1)
Basophils Relative: 0 %
Eosinophils Absolute: 0.1 10*3/uL (ref 0.0–0.5)
Eosinophils Relative: 1 %
HCT: 41.1 % (ref 39.0–52.0)
Hemoglobin: 13.7 g/dL (ref 13.0–17.0)
Immature Granulocytes: 0 %
Lymphocytes Relative: 19 %
Lymphs Abs: 2.1 10*3/uL (ref 0.7–4.0)
MCH: 31.5 pg (ref 26.0–34.0)
MCHC: 33.3 g/dL (ref 30.0–36.0)
MCV: 94.5 fL (ref 80.0–100.0)
Monocytes Absolute: 0.9 10*3/uL (ref 0.1–1.0)
Monocytes Relative: 8 %
Neutro Abs: 8.1 10*3/uL — ABNORMAL HIGH (ref 1.7–7.7)
Neutrophils Relative %: 72 %
Platelets: 149 10*3/uL — ABNORMAL LOW (ref 150–400)
RBC: 4.35 MIL/uL (ref 4.22–5.81)
RDW: 12.8 % (ref 11.5–15.5)
WBC: 11.3 10*3/uL — ABNORMAL HIGH (ref 4.0–10.5)
nRBC: 0 % (ref 0.0–0.2)

## 2021-05-22 LAB — BRAIN NATRIURETIC PEPTIDE: B Natriuretic Peptide: 689.2 pg/mL — ABNORMAL HIGH (ref 0.0–100.0)

## 2021-05-22 LAB — LACTIC ACID, PLASMA
Lactic Acid, Venous: 1.1 mmol/L (ref 0.5–1.9)
Lactic Acid, Venous: 0.7 mmol/L (ref 0.5–1.9)

## 2021-05-22 LAB — TROPONIN I (HIGH SENSITIVITY)
Troponin I (High Sensitivity): 26 ng/L — ABNORMAL HIGH (ref <18)
Troponin I (High Sensitivity): 23 ng/L — ABNORMAL HIGH (ref <18)

## 2021-05-22 MED ORDER — FUROSEMIDE 10 MG/ML IJ SOLN
20.0000 mg | Freq: Once | INTRAMUSCULAR | Status: AC
  Administered 2021-05-22: 20 mg via INTRAVENOUS
  Filled 2021-05-22: qty 4

## 2021-05-22 MED ORDER — ENOXAPARIN SODIUM 60 MG/0.6ML IJ SOSY
0.5000 mg/kg | PREFILLED_SYRINGE | Freq: Every day | INTRAMUSCULAR | Status: DC
  Administered 2021-05-22 – 2021-05-24 (×3): 47.5 mg via SUBCUTANEOUS
  Filled 2021-05-22 (×2): qty 0.47
  Filled 2021-05-22: qty 0.6
  Filled 2021-05-22: qty 0.47
  Filled 2021-05-22: qty 0.6

## 2021-05-22 MED ORDER — ATORVASTATIN CALCIUM 20 MG PO TABS
40.0000 mg | ORAL_TABLET | Freq: Every day | ORAL | Status: DC
  Administered 2021-05-23 – 2021-05-25 (×3): 40 mg via ORAL
  Filled 2021-05-22 (×3): qty 2

## 2021-05-22 MED ORDER — VITAMIN B-12 1000 MCG PO TABS
1000.0000 ug | ORAL_TABLET | Freq: Every day | ORAL | Status: DC
  Administered 2021-05-23 – 2021-05-25 (×3): 1000 ug via ORAL
  Filled 2021-05-22 (×3): qty 1

## 2021-05-22 MED ORDER — PANTOPRAZOLE SODIUM 40 MG PO TBEC
40.0000 mg | DELAYED_RELEASE_TABLET | Freq: Two times a day (BID) | ORAL | Status: DC
  Administered 2021-05-23 – 2021-05-25 (×6): 40 mg via ORAL
  Filled 2021-05-22 (×6): qty 1

## 2021-05-22 MED ORDER — ASPIRIN EC 81 MG PO TBEC
81.0000 mg | DELAYED_RELEASE_TABLET | Freq: Every day | ORAL | Status: DC
  Administered 2021-05-23 – 2021-05-25 (×3): 81 mg via ORAL
  Filled 2021-05-22 (×3): qty 1

## 2021-05-22 MED ORDER — IPRATROPIUM-ALBUTEROL 0.5-2.5 (3) MG/3ML IN SOLN
3.0000 mL | Freq: Once | RESPIRATORY_TRACT | Status: AC
  Administered 2021-05-22: 3 mL via RESPIRATORY_TRACT
  Filled 2021-05-22: qty 3

## 2021-05-22 MED ORDER — DIVALPROEX SODIUM ER 500 MG PO TB24
1500.0000 mg | ORAL_TABLET | Freq: Every day | ORAL | Status: DC
  Administered 2021-05-23 – 2021-05-24 (×2): 1500 mg via ORAL
  Filled 2021-05-22: qty 6
  Filled 2021-05-22 (×3): qty 3

## 2021-05-22 MED ORDER — IPRATROPIUM-ALBUTEROL 0.5-2.5 (3) MG/3ML IN SOLN
3.0000 mL | Freq: Four times a day (QID) | RESPIRATORY_TRACT | Status: DC
  Administered 2021-05-23 – 2021-05-24 (×5): 3 mL via RESPIRATORY_TRACT
  Filled 2021-05-22 (×5): qty 3

## 2021-05-22 MED ORDER — METHYLPREDNISOLONE SODIUM SUCC 40 MG IJ SOLR
40.0000 mg | Freq: Every day | INTRAMUSCULAR | Status: DC
  Administered 2021-05-22: 40 mg via INTRAVENOUS
  Filled 2021-05-22: qty 1

## 2021-05-22 MED ORDER — DONEPEZIL HCL 5 MG PO TABS
5.0000 mg | ORAL_TABLET | Freq: Every day | ORAL | Status: DC
  Administered 2021-05-23 – 2021-05-24 (×3): 5 mg via ORAL
  Filled 2021-05-22 (×4): qty 1

## 2021-05-22 MED ORDER — FUROSEMIDE 10 MG/ML IJ SOLN: 20.0000 mg | Freq: Every day | INTRAMUSCULAR | Status: DC

## 2021-05-22 MED ORDER — CARBAMAZEPINE 200 MG PO TABS
200.0000 mg | ORAL_TABLET | Freq: Two times a day (BID) | ORAL | Status: DC
  Administered 2021-05-23 – 2021-05-25 (×6): 200 mg via ORAL
  Filled 2021-05-22 (×7): qty 1

## 2021-05-22 MED ORDER — MONTELUKAST SODIUM 10 MG PO TABS
10.0000 mg | ORAL_TABLET | Freq: Every day | ORAL | Status: DC
  Administered 2021-05-23 – 2021-05-24 (×3): 10 mg via ORAL
  Filled 2021-05-22 (×3): qty 1

## 2021-05-22 MED ORDER — FLUTICASONE FUROATE-VILANTEROL 100-25 MCG/INH IN AEPB
1.0000 | INHALATION_SPRAY | Freq: Every day | RESPIRATORY_TRACT | Status: DC
  Administered 2021-05-23 – 2021-05-25 (×3): 1 via RESPIRATORY_TRACT
  Filled 2021-05-22: qty 28

## 2021-05-22 MED ORDER — ATENOLOL 50 MG PO TABS
50.0000 mg | ORAL_TABLET | Freq: Every day | ORAL | Status: DC
  Administered 2021-05-24 – 2021-05-25 (×2): 50 mg via ORAL
  Filled 2021-05-22 (×2): qty 1
  Filled 2021-05-22: qty 2

## 2021-05-22 MED ORDER — OLANZAPINE 5 MG PO TABS
5.0000 mg | ORAL_TABLET | Freq: Every day | ORAL | Status: DC
  Administered 2021-05-23 – 2021-05-24 (×3): 5 mg via ORAL
  Filled 2021-05-22 (×4): qty 1

## 2021-05-22 MED ORDER — GABAPENTIN 300 MG PO CAPS
300.0000 mg | ORAL_CAPSULE | Freq: Every day | ORAL | Status: DC
  Administered 2021-05-23 – 2021-05-24 (×3): 300 mg via ORAL
  Filled 2021-05-22 (×3): qty 1

## 2021-05-22 NOTE — ED Notes (Signed)
Pt placed on 4L [place], sats 95%

## 2021-05-22 NOTE — ED Notes (Signed)
US at the bedside

## 2021-05-22 NOTE — ED Notes (Signed)
Port CXR performed 

## 2021-05-22 NOTE — ED Notes (Signed)
MD at the bedside  

## 2021-05-22 NOTE — ED Triage Notes (Signed)
Pt states for the past few days he has had shortness of breath, worse with exertion and laying down, pt denies chest pain, states sob is worse today, pt has had a cough recently denies fevers. Pts room air sats in triage are 77%, pt has hx COPD, does not normally wear O2.

## 2021-05-22 NOTE — ED Provider Notes (Signed)
[facility] Emergency Department Provider Note   ____________________________________________   Event Date/Time   First MD Initiated Contact with Patient 05/22/21 1936     (approximate)  I have reviewed the triage vital signs and the nursing notes.   HISTORY  Chief Complaint Shortness of Breath    HPI Bradley Bowers is a 75 y.o. male who reports chronic shortness of breath from COPD.  He says today shortness of breath has gotten much worse.  He is not coughing anything up.  He is not running a fever.  He is not having any pleuritic chest pain.  Usually on 2 L he maintained good O2 saturations here he could not.  He required 4 L to maintain saturations over 90%.        Past Medical History:  Diagnosis Date   Arterial vascular disease 10/24/2014   Overview:  MI 1994, small-vessel disease requiring coumadin    Asthma    Centrilobular emphysema ([place]) 10/24/2014   Overview:  2L O2 prn    Centrilobular emphysema (HCC)    Chronic obstructive pulmonary disease ([place]) 11/06/2015   Coronary artery disease    GERD (gastroesophageal reflux disease)    Glaucoma    Heart attack ([place]) 2/17, 1994   Heart disease    Hyperlipemia    Hypertension    Restless leg 10/24/2014   Sleep apnea     Patient Active Problem List   Diagnosis Date Noted   Coronary artery disease 09/24/2016   Glaucoma 09/24/2016   Bipolar affective disorder, current episode manic with psychotic symptoms ([place]) 09/23/2016   Chronic obstructive pulmonary disease ([place]) 11/06/2015   Gastro-esophageal reflux disease without esophagitis 10/24/2014   Obstructive apnea 10/24/2014   Restless leg 10/24/2014    Past Surgical History:  Procedure Laterality Date   CATARACT EXTRACTION W/ INTRAOCULAR LENS  IMPLANT, BILATERAL     COLONOSCOPY WITH PROPOFOL N/A 02/19/2018   Procedure: COLONOSCOPY WITH PROPOFOL;  Surgeon: Lollie Sails, MD;  Location: [facility] ENDOSCOPY;  Service: Endoscopy;   Laterality: N/A;   ESOPHAGOGASTRODUODENOSCOPY (EGD) WITH PROPOFOL N/A 02/19/2018   Procedure: ESOPHAGOGASTRODUODENOSCOPY (EGD) WITH PROPOFOL;  Surgeon: Lollie Sails, MD;  Location: [facility] ENDOSCOPY;  Service: Endoscopy;  Laterality: N/A;   EYE SURGERY      Prior to Admission medications   Medication Sig Start Date End Date Taking? Authorizing Provider  ADVAIR DISKUS 100-50 MCG/ACT AEPB  01/10/21   [provider]  albuterol (VENTOLIN HFA) 108 (90 Base) MCG/ACT inhaler Inhale into the lungs.    [provider]  aspirin 81 MG EC tablet TAKE 1 TABLET (81 MG TOTAL) BY MOUTH ONCE DAILY. Patient not taking: Reported on 03/26/2021 11/14/15   [provider]  aspirin 81 MG EC tablet Take by mouth.    [provider]  atenolol (TENORMIN) 50 MG tablet TAKE 1 TABLET (50 MG TOTAL) BY MOUTH ONCE DAILY. 09/22/15   [provider]  atenolol (TENORMIN) 50 MG tablet Take by mouth. Patient not taking: Reported on 03/26/2021    [provider]  atorvastatin (LIPITOR) 80 MG tablet TAKE 1 TABLET (80 MG TOTAL) BY MOUTH ONCE DAILY. Patient not taking: Reported on 03/26/2021 09/22/15   [provider]  atorvastatin (LIPITOR) 80 MG tablet Take by mouth. 11/22/20   [provider]  Calcium-Magnesium-Vitamin D (CITRACAL CALCIUM+D) [id number] MG-MG-UNIT TB24 Take by mouth. Patient not taking: Reported on 03/26/2021 03/18/12   [provider]  Calcium-Magnesium-Vitamin D [id number] MG-MG-UNIT TB24 Take by mouth.  03/18/12   [provider]  carbamazepine (CARBATROL) 200 MG 12 hr capsule  12/27/20   [provider]  carbamazepine (TEGRETOL) 200 MG tablet Take 1 tablet by mouth 2 (two) times daily. 01/12/21 01/12/22  [provider]  divalproex (DEPAKOTE ER) 500 MG 24 hr tablet Take 3 tablets (1,500 mg total) by mouth at bedtime. 10/06/16   Pucilowska, Herma Ard B, MD  donepezil (ARICEPT) 5 MG tablet Take by mouth. 02/28/21 02/28/22   [provider]  fluticasone (FLONASE) 50 MCG/ACT nasal spray 1 spray by Each Nare route daily. Patient not taking: Reported on 03/26/2021    [provider]  fluticasone (FLONASE) 50 MCG/ACT nasal spray Place into the nose. Patient not taking: Reported on 03/26/2021    [provider]  fluticasone furoate-vilanterol (BREO ELLIPTA) 100-25 MCG/INH AEPB Inhale into the lungs. 02/28/21   [provider]  Fluticasone-Salmeterol (ADVAIR) 250-50 MCG/DOSE AEPB Inhale into the lungs.    [provider]  gabapentin (NEURONTIN) 300 MG capsule Take by mouth. 12/06/09   [provider]  lisinopril (PRINIVIL,ZESTRIL) 5 MG tablet TAKE 1 TABLET (5 MG TOTAL) BY MOUTH ONCE DAILY. Patient not taking: Reported on 03/26/2021 02/04/16   [provider]  lisinopril (ZESTRIL) 5 MG tablet Take 1 tablet by mouth daily. 11/06/20   [provider]  methocarbamol (ROBAXIN) 500 MG tablet Take 500 mg by mouth 3 (three) times daily as needed. 08/01/15   [provider]  methocarbamol (ROBAXIN) 500 MG tablet Take by mouth.    [provider]  montelukast (SINGULAIR) 10 MG tablet Take 10 mg by mouth at bedtime.    [provider]  OLANZapine (ZYPREXA) 10 MG tablet Take 1 tablet (10 mg total) by mouth at bedtime. 10/06/16   Pucilowska, Jolanta B, MD  OLANZapine (ZYPREXA) 5 MG tablet Take by mouth. 11/22/20   [provider]  omeprazole (PRILOSEC) 40 MG capsule TAKE 1 CAPSULE (40 MG TOTAL) BY MOUTH 2 (TWO) TIMES DAILY. Patient not taking: Reported on 03/26/2021 10/19/15   [provider]  omeprazole (PRILOSEC) 40 MG capsule Take 1 capsule by mouth 2 (two) times daily. 11/06/20   [provider]  ticagrelor (BRILINTA) 90 MG TABS tablet Take by mouth. 11/20/15   [provider]  timolol (TIMOPTIC) 0.5 % ophthalmic solution USE 1 DROP(S) IN BOTH EYES ONCE A DAY 02/04/16   [provider]  tiotropium  (SPIRIVA) 18 MCG inhalation capsule Place into inhaler and inhale. Patient not taking: Reported on 03/26/2021    [provider]  tiotropium (SPIRIVA) 18 MCG inhalation capsule Place into inhaler and inhale.    [provider]  Travoprost, BAK Free, (TRAVATAN) 0.004 % SOLN ophthalmic solution Frequency:PHARMDIR   Dosage:0.0     Instructions:  Note:1 gtt in each eye q pm Dose: 1 05/13/12   [provider]  vitamin B-12 1000 MCG tablet Take 1 tablet (1,000 mcg total) by mouth daily. 10/07/16   Pucilowska, Wardell Honour, MD    Allergies Patient has no known allergies.  Family History  Problem Relation Age of Onset   Nephrolithiasis Father    Prostate cancer Neg Hx    Bladder Cancer Neg Hx    Kidney cancer Neg Hx     Social History Social History   Tobacco Use   Smoking status: Former    Packs/day: 1.00    Years: 30.00    Pack years: 30.00    Types: Cigarettes    Quit date: 03/07/1994  Years since quitting: 27.2   Smokeless tobacco: Never  Vaping Use   Vaping Use: Never used  Substance Use Topics   Alcohol use: No    Alcohol/week: 0.0 standard drinks   Drug use: No    Review of Systems  Constitutional: No fever/chills Eyes: No visual changes. ENT: No sore throat. Cardiovascular: Denies chest pain. Respiratory: shortness of breath. Gastrointestinal: No abdominal pain.  No nausea, no vomiting.  No diarrhea.  No constipation. Genitourinary: Negative for dysuria. Musculoskeletal: Negative for back pain. Skin: Negative for rash. Neurological: Negative for headaches, focal weakness   ____________________________________________   PHYSICAL EXAM:  VITAL SIGNS: ED Triage Vitals  Enc Vitals Group     BP 05/22/21 1927 (!) 155/103     Pulse Rate 05/22/21 1927 66     Resp 05/22/21 1927 (!) 22     Temp 05/22/21 1927 98.9 F (37.2 C)     Temp Source 05/22/21 1927 Oral     SpO2 05/22/21 1927 (!) 77 %     Weight 05/22/21 1928 209 lb (94.8 kg)     Height  05/22/21 1928 '5\' 6"'$  (1.676 m)     Head Circumference --      Peak Flow --      Pain Score 05/22/21 1925 0     Pain Loc --      Pain Edu? --      Excl. in [place]? --     Constitutional: Alert and oriented.  Well appearing but breathing hard and sitting rigidly Eyes: Conjunctivae are normal. PER Head: Atraumatic. Nose: No congestion/rhinnorhea. Mouth/Throat: Mucous membranes are moist.  Oropharynx non-erythematous. Neck: No stridor.   Cardiovascular: Normal rate, regular rhythm. Grossly normal heart sounds.  Good peripheral circulation. Respiratory: Increased respiratory effort.  No retractions. Lungs CTAB. Gastrointestinal: Soft and nontender. No distention. No abdominal bruits.  Musculoskeletal: No lower extremity tenderness bilateral 1+ edema.  Neurologic:  Normal speech and language. No gross focal neurologic deficits are appreciated.  Skin: Intact no rashes  ____________________________________________   LABS (all labs ordered are listed, but only abnormal results are displayed)  Labs Reviewed  COMPREHENSIVE METABOLIC PANEL - Abnormal; Notable for the following components:      Result Value   Chloride 96 (*)    Glucose, Bld 107 (*)    BUN 27 (*)    Creatinine, Ser 1.37 (*)    GFR, Estimated 54 (*)    All other components within normal limits  BRAIN NATRIURETIC PEPTIDE - Abnormal; Notable for the following components:   B Natriuretic Peptide 689.2 (*)    All other components within normal limits  CBC WITH DIFFERENTIAL/PLATELET - Abnormal; Notable for the following components:   WBC 11.3 (*)    Platelets 149 (*)    Neutro Abs 8.1 (*)    All other components within normal limits  TROPONIN I (HIGH SENSITIVITY) - Abnormal; Notable for the following components:   Troponin I (High Sensitivity) 26 (*)    All other components within normal limits  LACTIC ACID, PLASMA  LACTIC ACID, PLASMA  TROPONIN I (HIGH SENSITIVITY)    ____________________________________________  EKG  EKG #1 shows sinus bradycardia rate of 57 normal axis no acute ST-T wave changes except for a flipped T in aVL EKG #2 shows sinus bradycardia at 54 normal axis also no acute ST-T wave changes except for the same flipped T in aVL there is no change from #1 ____________________________________________  RADIOLOGY Gertha Calkin, personally viewed and evaluated these  images (plain radiographs) as part of my medical decision making, as well as reviewing the written report by the radiologist.  ED MD interpretation: Chest x-ray read by radiology reviewed by me also shows what I think is a little bit of increased vascular markings which may represent CHF as well as what is written below and the official report which I otherwise agree with.  Official radiology report(s): DG Chest Portable 1 View  Result Date: 05/22/2021 CLINICAL DATA:  Shortness of breath.  Hypoxia history of COPD. EXAM: PORTABLE CHEST 1 VIEW COMPARISON:  Report from chest radiograph 02/28/2021, images not available. FINDINGS: Mild hyperinflation. There is diffuse peribronchial thickening. Upper normal heart size with normal mediastinal contours. Minor streaky opacities at the lung bases. No confluent consolidation. No pleural fluid or pneumothorax. No acute osseous abnormalities are seen. Thoracic spondylosis with endplate spurring. IMPRESSION: 1. Hyperinflation and peribronchial thickening consistent with COPD. 2. Minor streaky opacities at the lung bases may represent atelectasis, scarring, or less likely pneumonia. Electronically Signed   By: Keith Rake M.D.   On: 05/22/2021 19:51    ____________________________________________   PROCEDURES  Procedure(s) performed (including Critical Care):  Procedures   ____________________________________________   INITIAL IMPRESSION / ASSESSMENT AND PLAN / ED COURSE   ----------------------------------------- 9:33 PM  on 05/22/2021 ----------------------------------------- Patient is troponin comes up slightly elevated.  His BNP is over 600.  His chest x-ray looks to me like there is some increased vascular markings consistent with early CHF.  Careful review of the patient's old records does not show any sign of taking Lasix or having a prior diagnosis of CHF.  Patient is still hypoxic and in some respiratory distress.  I will give him another breathing treatment and some Lasix and we will put him in the hospital for new CHF.             ____________________________________________   FINAL CLINICAL IMPRESSION(S) / ED DIAGNOSES  Final diagnoses:  Dyspnea, unspecified type  Hypoxia  New onset of congestive heart failure [facility])     ED Discharge Orders          Ordered    SARS Coronavirus 2 (TAT 6-24 hrs)        05/22/21 2136             Note:  This document was prepared using Dragon voice recognition software and may include unintentional dictation errors.    Nena Polio, MD 05/22/21 2136

## 2021-05-22 NOTE — Progress Notes (Signed)
PHARMACIST - PHYSICIAN COMMUNICATION  CONCERNING:  Enoxaparin (Lovenox) for DVT Prophylaxis    RECOMMENDATION: Patient was prescribed enoxaparin '40mg'$  q24 hours for VTE prophylaxis.   Filed Weights   05/22/21 1928  Weight: 94.8 kg (209 lb)    Body mass index is 33.73 kg/m.  Estimated Creatinine Clearance: 51.8 mL/min (A) (by C-G formula based on SCr of 1.37 mg/dL (H)).   Based on [place] patient is candidate for enoxaparin 0.'5mg'$ /kg TBW SQ every 24 hours based on BMI being >30.  DESCRIPTION: Pharmacy has adjusted enoxaparin dose per [facility] policy.  Patient is now receiving enoxaparin 47.5 mg every 24 hours    Benita Gutter 05/22/2021 11:10 PM

## 2021-05-22 NOTE — H&P (Signed)
History and Physical    Bradley Bowers [id number] DOB: 04/21/1946 DOA: 05/22/2021  PCP: Rusty Aus, MD  Patient coming from: Home  I have personally briefly reviewed patient's old medical records in [place]  Chief Complaint: Increasing shortness of breath  HPI: Bradley Bowers is a 75 y.o. male with medical history significant for COPD, CAD s/p right RCA stent, OSA not on CPAP, history of small vessel disease previously requiring Coumadin, bipolar 1 disorder, restless leg syndrome, hyperlipidemia and GERD who presents with concerns of increasing shortness of breath.  Patient reports he has had years of shortness of breath but today just awoke with acutely worsened dyspnea.  He states he cannot do anything around the house.  He does not wear oxygen at home.  States the last time he had oxygen was probably 10 years ago when he was placed on 2 L but later for some reason was able to come off.  He recently started  pulmonary rehab.  Had new cough today that he states went away after drinking water.  He denies any orthopnea or PND.  He is able to sleep flat on his left side in bed.  He denies any lower extremity edema but was noted to have some during exam which he later states is chronic.  Denies any chest pain or tightness.  He denies any calf pain.  Endorse he mostly sits around at home watching TV and sometimes working on the yard.  Denies any recent prolonged travel.  Denies any current tobacco, alcohol or illicit drug use   ED Course: He was afebrile, normotensive and initially hypoxic down to 77% on room air and had to be placed on 4 L.  WBC of 11.3, no anemia.  Creatinine of 1.37.Prior of 1.4 about 5 days ago. Unclear baseline creatinine.  BNP of 690. Troponin flat at 26. EKG with sinus bradycardia but no ST or T wave changes.   CXR no overt edema and findings consistent with COPD.   Review of Systems: Constitutional: No Weight Change, No Fever ENT/Mouth: No sore  throat, No Rhinorrhea Eyes: No Eye Pain, No Vision Changes Cardiovascular: No Chest Pain, + SOB, No PND, + Dyspnea on Exertion, No Orthopnea, No Claudication, + Edema, No Palpitations Respiratory: + Cough, No Sputum, No Wheezing, + Dyspnea  Gastrointestinal: No Nausea, No Vomiting, No Diarrhea, No Constipation, No Pain Genitourinary: no Urinary Incontinence, No Urgency, No Flank Pain Musculoskeletal: No Arthralgias, No Myalgias Skin: No Skin Lesions, No Pruritus, Neuro: no Weakness, No Numbness,  Psych: No Anxiety/Panic, No Depression, no decrease appetite Heme/Lymph: No Bruising, No Bleeding  Past Medical History:  Diagnosis Date   Arterial vascular disease 10/24/2014   Overview:  MI 1994, small-vessel disease requiring coumadin    Asthma    Centrilobular emphysema ([place]) 10/24/2014   Overview:  2L O2 prn    Centrilobular emphysema (HCC)    Chronic obstructive pulmonary disease ([place]) 11/06/2015   Coronary artery disease    GERD (gastroesophageal reflux disease)    Glaucoma    Heart attack ([place]) 2/17, 1994   Heart disease    Hyperlipemia    Hypertension    Restless leg 10/24/2014   Sleep apnea     Past Surgical History:  Procedure Laterality Date   CATARACT EXTRACTION W/ INTRAOCULAR LENS  IMPLANT, BILATERAL     COLONOSCOPY WITH PROPOFOL N/A 02/19/2018   Procedure: COLONOSCOPY WITH PROPOFOL;  Surgeon: Lollie Sails, MD;  Location[facility] ENDOSCOPY;  Service: Endoscopy;  Laterality: N/A;   ESOPHAGOGASTRODUODENOSCOPY (EGD) WITH PROPOFOL N/A 02/19/2018   Procedure: ESOPHAGOGASTRODUODENOSCOPY (EGD) WITH PROPOFOL;  Surgeon: Lollie Sails, MD;  Location: [facility] ENDOSCOPY;  Service: Endoscopy;  Laterality: N/A;   EYE SURGERY       reports that he quit smoking about 27 years ago. His smoking use included cigarettes. He has a 30.00 pack-year smoking history. He has never used smokeless tobacco. He reports that he does not drink alcohol and does not use drugs. Social History  No  Known Allergies  Family History  Problem Relation Age of Onset   Nephrolithiasis Father    Prostate cancer Neg Hx    Bladder Cancer Neg Hx    Kidney cancer Neg Hx      Prior to Admission medications   Medication Sig Start Date End Date Taking? Authorizing Provider  ADVAIR DISKUS 100-50 MCG/ACT AEPB Inhale 1 puff into the lungs 2 (two) times daily. 01/10/21  Yes [provider]  albuterol (VENTOLIN HFA) 108 (90 Base) MCG/ACT inhaler Inhale 1-2 puffs into the lungs every 4 (four) hours as needed.   Yes [provider]  aspirin 81 MG EC tablet Take 81 mg by mouth daily. 11/14/15  Yes [provider]  atenolol (TENORMIN) 50 MG tablet Take 50 mg by mouth daily.   Yes [provider]  atorvastatin (LIPITOR) 80 MG tablet Take 40 mg by mouth daily. 11/22/20  Yes [provider]  carbamazepine (TEGRETOL) 200 MG tablet Take 1 tablet by mouth 2 (two) times daily. 01/12/21 01/12/22 Yes [provider]  divalproex (DEPAKOTE ER) 500 MG 24 hr tablet Take 3 tablets (1,500 mg total) by mouth at bedtime. 10/06/16  Yes Pucilowska, Jolanta B, MD  donepezil (ARICEPT) 5 MG tablet Take 5 mg by mouth at bedtime. 02/28/21 02/28/22 Yes [provider]  fluticasone (FLONASE) 50 MCG/ACT nasal spray Place 1 spray into both nostrils daily.   Yes [provider]  fluticasone furoate-vilanterol (BREO ELLIPTA) 100-25 MCG/INH AEPB Inhale 1 puff into the lungs daily. 02/28/21  Yes [provider]  gabapentin (NEURONTIN) 300 MG capsule Take 300 mg by mouth at bedtime. 12/06/09  Yes [provider]  lisinopril (ZESTRIL) 5 MG tablet Take 1 tablet by mouth daily. 11/06/20  Yes [provider]  montelukast (SINGULAIR) 10 MG tablet Take 10 mg by mouth at bedtime.   Yes [provider]  OLANZapine (ZYPREXA) 5 MG tablet Take 5 mg by mouth at bedtime. 11/22/20  Yes [provider]  pantoprazole (PROTONIX) 40 MG tablet Take 40 mg by  mouth 2 (two) times daily. 04/17/21  Yes [provider]  tiotropium (SPIRIVA) 18 MCG inhalation capsule Place 18 mcg into inhaler and inhale daily.   Yes [provider]  vitamin B-12 1000 MCG tablet Take 1 tablet (1,000 mcg total) by mouth daily. 10/07/16  Yes Clovis Fredrickson, MD    Physical Exam: Vitals:   05/22/21 2100 05/22/21 2130 05/22/21 2200 05/22/21 2230  BP: (!) 128/59 (!) 147/71 140/71 (!) 154/77  Pulse: (!) 52 61 (!) 49 (!) 59  Resp: '19 19 19 17  '$ Temp:      TempSrc:      SpO2: 98% 99% 98% 93%  Weight:      Height:        Constitutional: NAD, calm, comfortable, nontoxic appearing obese male sitting upright with legs hanging on side of the bed Vitals:   05/22/21 2100 05/22/21 2130 05/22/21 2200 05/22/21 2230  BP: (!) 128/59 Marland Kitchen)  147/71 140/71 (!) 154/77  Pulse: (!) 52 61 (!) 49 (!) 59  Resp: '19 19 19 17  '$ Temp:      TempSrc:      SpO2: 98% 99% 98% 93%  Weight:      Height:       Eyes: PERRL, lids and conjunctivae normal ENMT: Mucous membranes are moist.  Neck: normal, supple Respiratory: clear to auscultation bilaterally, no wheezing, no crackles following duoneb treatment. Normal respiratory effort on 4L No accessory muscle use.  Cardiovascular: Regular rate and rhythm, no murmurs / rubs / gallops. +2 extremity edema on to right  on left LE. +1 pitting distal left LE.    Abdomen: no tenderness, no masses palpated.  Bowel sounds positive.  Musculoskeletal: no clubbing / cyanosis. No joint deformity upper and lower extremities. Good ROM, no contractures. Normal muscle tone.  Skin: no rashes, lesions, ulcers. No induration Neurologic: CN 2-12 grossly intact. Sensation intact, Strength 5/5 in all 4.  Psychiatric: Normal judgment and insight. Alert and oriented x 3. Normal mood.    Labs on Admission: I have personally reviewed following labs and imaging studies  CBC: Recent Labs  Lab 05/22/21 1941  WBC 11.3*  NEUTROABS 8.1*  HGB 13.7  HCT  41.1  MCV 94.5  PLT 123456*   Basic Metabolic Panel: Recent Labs  Lab 05/22/21 1941  NA 136  K 5.0  CL 96*  CO2 31  GLUCOSE 107*  BUN 27*  CREATININE 1.37*  CALCIUM 8.9   GFR: Estimated Creatinine Clearance: 51.8 mL/min (A) (by C-G formula based on SCr of 1.37 mg/dL (H)). Liver Function Tests: Recent Labs  Lab 05/22/21 1941  AST 29  ALT 43  ALKPHOS 92  BILITOT 0.7  PROT 7.3  ALBUMIN 3.6   No results for input(s): LIPASE, AMYLASE in the last 168 hours. No results for input(s): AMMONIA in the last 168 hours. Coagulation Profile: No results for input(s): INR, PROTIME in the last 168 hours. Cardiac Enzymes: No results for input(s): CKTOTAL, CKMB, CKMBINDEX, TROPONINI in the last 168 hours. BNP (last 3 results) No results for input(s): PROBNP in the last 8760 hours. HbA1C: No results for input(s): HGBA1C in the last 72 hours. CBG: No results for input(s): GLUCAP in the last 168 hours. Lipid Profile: No results for input(s): CHOL, HDL, LDLCALC, TRIG, CHOLHDL, LDLDIRECT in the last 72 hours. Thyroid Function Tests: No results for input(s): TSH, T4TOTAL, FREET4, T3FREE, THYROIDAB in the last 72 hours. Anemia Panel: No results for input(s): VITAMINB12, FOLATE, FERRITIN, TIBC, IRON, RETICCTPCT in the last 72 hours. Urine analysis:    Component Value Date/Time   COLORURINE YELLOW (A) 09/24/2016 1901   APPEARANCEUR HAZY (A) 09/24/2016 1901   APPEARANCEUR Clear 02/20/2016 1102   LABSPEC 1.018 09/24/2016 1901   PHURINE 5.0 09/24/2016 1901   GLUCOSEU NEGATIVE 09/24/2016 1901   HGBUR MODERATE (A) 09/24/2016 1901   BILIRUBINUR NEGATIVE 09/24/2016 1901   BILIRUBINUR Negative 02/20/2016 1102   KETONESUR NEGATIVE 09/24/2016 1901   PROTEINUR NEGATIVE 09/24/2016 1901   NITRITE NEGATIVE 09/24/2016 1901   LEUKOCYTESUR NEGATIVE 09/24/2016 1901   LEUKOCYTESUR Negative 02/20/2016 1102    Radiological Exams on Admission: DG Chest Portable 1 View  Result Date:  05/22/2021 CLINICAL DATA:  Shortness of breath.  Hypoxia history of COPD. EXAM: PORTABLE CHEST 1 VIEW COMPARISON:  Report from chest radiograph 02/28/2021, images not available. FINDINGS: Mild hyperinflation. There is diffuse peribronchial thickening. Upper normal heart size with normal mediastinal contours. Minor streaky opacities at the  lung bases. No confluent consolidation. No pleural fluid or pneumothorax. No acute osseous abnormalities are seen. Thoracic spondylosis with endplate spurring. IMPRESSION: 1. Hyperinflation and peribronchial thickening consistent with COPD. 2. Minor streaky opacities at the lung bases may represent atelectasis, scarring, or less likely pneumonia. Electronically Signed   By: Keith Rake M.D.   On: 05/22/2021 19:51      Assessment/Plan  Acute hypoxic respiratory failure likely secondary to COPD exacerbation -Admitted on 4 L.  Does not wear oxygen at home. -Daily IV Solu-Medrol X -Scheduled q6 duoneb   Questionable CHF -pt with new onset dyspnea, LE edema on exam but no chest discomfort or orthopnea. However has hx of CAD s/p RCA stent, elevated BNP around 690.  -obtain echocardiology -give daily IV Lasix '20mg'$   -Strict intake and output, daily weights -Keep on continuous telemetry  LE edema worse on the left -obtain left LE doppler venous ultrasound  AKI vs worsening renal function -creatinine on admit of 1.37 with recent at 1.4 few days ago -Hold ACE for now and monitor with repeat lab  CAD s/p stent  -continue aspirin   OSA -not on CPAP  HLD -continue statin   Bipolar disorder  -continue Tegretor, depakote  DVT prophylaxis:.Lovenox Code Status: Full Family Communication: Plan discussed with patient at bedside  disposition Plan: Home with at least 2 midnight stays  Consults called:  Admission status: inpatient   Level of care: Progressive Cardiac  Status is: Inpatient  Remains inpatient appropriate because:Inpatient level of care  appropriate due to severity of illness  Dispo: The patient is from: Home              Anticipated d/c is to: Home              Patient currently is not medically stable to d/c.   Difficult to place patient No         Orene Desanctis DO [facility]   If 7PM-7AM, please contact night-coverage [email or web address]   05/22/2021, 11:07 PM

## 2021-05-23 DIAGNOSIS — J9601 Acute respiratory failure with hypoxia: Secondary | ICD-10-CM

## 2021-05-23 LAB — BASIC METABOLIC PANEL
Anion gap: 9 (ref 5–15)
BUN: 26 mg/dL — ABNORMAL HIGH (ref 8–23)
CO2: 32 mmol/L (ref 22–32)
Calcium: 8.8 mg/dL — ABNORMAL LOW (ref 8.9–10.3)
Chloride: 99 mmol/L (ref 98–111)
Creatinine, Ser: 1.57 mg/dL — ABNORMAL HIGH (ref 0.61–1.24)
GFR, Estimated: 46 mL/min — ABNORMAL LOW (ref >60)
Glucose, Bld: 116 mg/dL — ABNORMAL HIGH (ref 70–99)
Potassium: 5.2 mmol/L — ABNORMAL HIGH (ref 3.5–5.1)
Sodium: 140 mmol/L (ref 135–145)

## 2021-05-23 LAB — CBC
HCT: 42.9 % (ref 39.0–52.0)
Hemoglobin: 14.1 g/dL (ref 13.0–17.0)
MCH: 31.3 pg (ref 26.0–34.0)
MCHC: 32.9 g/dL (ref 30.0–36.0)
MCV: 95.3 fL (ref 80.0–100.0)
Platelets: 147 10*3/uL — ABNORMAL LOW (ref 150–400)
RBC: 4.5 MIL/uL (ref 4.22–5.81)
RDW: 12.7 % (ref 11.5–15.5)
WBC: 9.2 10*3/uL (ref 4.0–10.5)
nRBC: 0 % (ref 0.0–0.2)

## 2021-05-23 LAB — RESP PANEL BY RT-PCR (FLU A&B, COVID) ARPGX2
Influenza A by PCR: NEGATIVE
Influenza B by PCR: NEGATIVE
SARS Coronavirus 2 by RT PCR: NEGATIVE

## 2021-05-23 LAB — D-DIMER, QUANTITATIVE: D-Dimer, Quant: 0.5 ug/mL-FEU (ref 0.00–0.50)

## 2021-05-23 MED ORDER — TIOTROPIUM BROMIDE MONOHYDRATE 18 MCG IN CAPS
18.0000 ug | ORAL_CAPSULE | Freq: Every day | RESPIRATORY_TRACT | Status: DC
  Administered 2021-05-23 – 2021-05-25 (×3): 18 ug via RESPIRATORY_TRACT
  Filled 2021-05-23: qty 5

## 2021-05-23 MED ORDER — PREDNISONE 20 MG PO TABS
40.0000 mg | ORAL_TABLET | Freq: Every day | ORAL | Status: DC
  Administered 2021-05-23 – 2021-05-25 (×3): 40 mg via ORAL
  Filled 2021-05-23 (×3): qty 2

## 2021-05-23 MED ORDER — FUROSEMIDE 10 MG/ML IJ SOLN
20.0000 mg | Freq: Every day | INTRAMUSCULAR | Status: DC
  Administered 2021-05-23 – 2021-05-25 (×3): 20 mg via INTRAVENOUS
  Filled 2021-05-23: qty 2
  Filled 2021-05-23: qty 4
  Filled 2021-05-23: qty 2

## 2021-05-23 MED ORDER — MOMETASONE FURO-FORMOTEROL FUM 100-5 MCG/ACT IN AERO
2.0000 | INHALATION_SPRAY | Freq: Two times a day (BID) | RESPIRATORY_TRACT | Status: DC
  Administered 2021-05-23 – 2021-05-25 (×4): 2 via RESPIRATORY_TRACT
  Filled 2021-05-23: qty 8.8

## 2021-05-23 NOTE — ED Notes (Signed)
Pt incontinent of urine. Primofit leaking. A new primofit placed on pt, bed linen changed and clean gown placed on pt. Pt with no further needs at this time. Call light within reach.

## 2021-05-23 NOTE — Progress Notes (Signed)
Patient arrived to room 2A44 from ED.  Assessment complete, VS obtained, and Admission database began.

## 2021-05-23 NOTE — ED Notes (Signed)
Informed RN bed assigned 

## 2021-05-23 NOTE — ED Notes (Signed)
Pt set up with breakfast tray.

## 2021-05-23 NOTE — Progress Notes (Addendum)
PROGRESS NOTE    Bradley Bowers  [id number] DOB: [birth year] DOA: 05/22/2021 PCP: Rusty Aus, MD  Outpatient Specialists: [facility] cardiology    Brief Narrative:    Bradley Bowers is a 75 y.o. male with medical history significant for COPD, CAD s/p right RCA stent, OSA not on CPAP, history of small vessel disease previously requiring Coumadin, bipolar 1 disorder, restless leg syndrome, hyperlipidemia and GERD who presents with concerns of increasing shortness of breath.   Patient reports he has had years of shortness of breath but today just awoke with acutely worsened dyspnea.  He states he cannot do anything around the house.  He does not wear oxygen at home.  States the last time he had oxygen was probably 10 years ago when he was placed on 2 L but later for some reason was able to come off.  He recently started  pulmonary rehab.  Had new cough today that he states went away after drinking water.  He denies any orthopnea or PND.  He is able to sleep flat on his left side in bed.  He denies any lower extremity edema but was noted to have some during exam which he later states is chronic.  Denies any chest pain or tightness.  He denies any calf pain.  Endorse he mostly sits around at home watching TV and sometimes working on the yard.  Denies any recent prolonged travel.   Assessment & Plan:   Principal Problem:   Acute respiratory failure with hypoxia (HCC) Active Problems:   Obstructive apnea   Coronary artery disease   COPD with acute exacerbation (HCC)   Hyperlipemia   Bipolar 1 disorder (HCC)   Renal insufficiency   Leg edema   # Acute hypoxic respiratory failure DDX below. O2 70s on arrival. PVLs neg for DVT - cont 4 L, wean as able - will check dimer, DTA if elevated  # COPD with possible exacerbation Hx COPD. No sig wheezing on exam - cont duonebs and prednisone - cont home inhalers  # Possible CHF exacerbation Here with dyspnea on exertion, mild  orthopnea. BNP elevated, hypoxic. Though no sig LE edema, no pulmonary edema. - TTE pending, cont lasix 20 iv qd for now - strict I/os - maintain telemetry  # CKD 2/3 KI vs worsening renal function GFR 50s-60s, cr 1.1-1.4 over past year, here 1.4 - monitor - holding lisinopril for now   # CAD s/p stent  No chest pain, trops mildly elevated but flat, do not suspect ACS -continue aspirin, statin, atenolol  # HTN Here normotensive - holding lisinopril as above  # Dementia - continue aricept   # OSA -not on CPAP   # Bipolar disorder  -continue Tegretor, depakote  # Neuropathy - cont home gabapentin   DVT prophylaxis: lovenox Code Status: full Family Communication: wife updated @ bedside 8/16. Unable to reach her telephonically 8/17  Level of care: Progressive Cardiac Status is: Inpatient  Remains inpatient appropriate because:Inpatient level of care appropriate due to severity of illness  Dispo: The patient is from: Home              Anticipated d/c is to: Home              Patient currently is not medically stable to d/c.   Difficult to place patient No        Consultants:  none  Procedures: none  Antimicrobials:  none    Subjective: This morning asleep but arouses.  Denies chest pain or cough or fever or abd pain or n/v. Mild sob when lying flat but sleeping flat.  Objective: Vitals:   05/23/21 0500 05/23/21 0600 05/23/21 0630 05/23/21 0733  BP: (!) 106/38 110/70 (!) 112/41 129/70  Pulse: (!) 55 (!) 51 62 67  Resp: '14 15 19 20  '$ Temp:      TempSrc:      SpO2: 98% 98% 98% 97%  Weight:      Height:        Intake/Output Summary (Last 24 hours) at 05/23/2021 0737 Last data filed at 05/23/2021 0153 Gross per 24 hour  Intake --  Output 1450 ml  Net -1450 ml   Filed Weights   05/22/21 1928  Weight: 94.8 kg    Examination:  General exam: Appears calm and comfortable  Respiratory system: Clear to auscultation. Respiratory effort  normal. Cardiovascular system: S1 & S2 heard, RRR. No JVD, murmurs, rubs, gallops or clicks. No pedal edema. Gastrointestinal system: Abdomen is obese, soft and nontender. No organomegaly or masses felt. Normal bowel sounds heard. Central nervous system: Alert and oriented. No focal neurological deficits. Extremities: Symmetric 5 x 5 power. Skin: No rashes, lesions or ulcers Psychiatry: Judgement and insight appear normal. Mood & affect appropriate.     Data Reviewed: I have personally reviewed following labs and imaging studies  CBC: Recent Labs  Lab 05/22/21 1941  WBC 11.3*  NEUTROABS 8.1*  HGB 13.7  HCT 41.1  MCV 94.5  PLT 123456*   Basic Metabolic Panel: Recent Labs  Lab 05/22/21 1941  NA 136  K 5.0  CL 96*  CO2 31  GLUCOSE 107*  BUN 27*  CREATININE 1.37*  CALCIUM 8.9   GFR: Estimated Creatinine Clearance: 51.8 mL/min (A) (by C-G formula based on SCr of 1.37 mg/dL (H)). Liver Function Tests: Recent Labs  Lab 05/22/21 1941  AST 29  ALT 43  ALKPHOS 92  BILITOT 0.7  PROT 7.3  ALBUMIN 3.6   No results for input(s): LIPASE, AMYLASE in the last 168 hours. No results for input(s): AMMONIA in the last 168 hours. Coagulation Profile: No results for input(s): INR, PROTIME in the last 168 hours. Cardiac Enzymes: No results for input(s): CKTOTAL, CKMB, CKMBINDEX, TROPONINI in the last 168 hours. BNP (last 3 results) No results for input(s): PROBNP in the last 8760 hours. HbA1C: No results for input(s): HGBA1C in the last 72 hours. CBG: No results for input(s): GLUCAP in the last 168 hours. Lipid Profile: No results for input(s): CHOL, HDL, LDLCALC, TRIG, CHOLHDL, LDLDIRECT in the last 72 hours. Thyroid Function Tests: No results for input(s): TSH, T4TOTAL, FREET4, T3FREE, THYROIDAB in the last 72 hours. Anemia Panel: No results for input(s): VITAMINB12, FOLATE, FERRITIN, TIBC, IRON, RETICCTPCT in the last 72 hours. Urine analysis:    Component Value Date/Time    COLORURINE YELLOW (A) 09/24/2016 1901   APPEARANCEUR HAZY (A) 09/24/2016 1901   APPEARANCEUR Clear 02/20/2016 1102   LABSPEC 1.018 09/24/2016 1901   PHURINE 5.0 09/24/2016 1901   GLUCOSEU NEGATIVE 09/24/2016 1901   HGBUR MODERATE (A) 09/24/2016 1901   BILIRUBINUR NEGATIVE 09/24/2016 1901   BILIRUBINUR Negative 02/20/2016 1102   KETONESUR NEGATIVE 09/24/2016 1901   PROTEINUR NEGATIVE 09/24/2016 1901   NITRITE NEGATIVE 09/24/2016 1901   LEUKOCYTESUR NEGATIVE 09/24/2016 1901   LEUKOCYTESUR Negative 02/20/2016 1102   Sepsis Labs: '@LABRCNTIP'$ (procalcitonin:4,lacticidven:4)  ) Recent Results (from the past 240 hour(s))  Resp Panel by RT-PCR (Flu A&B, Covid) Nasopharyngeal Swab  Status: None   Collection Time: 05/23/21  1:59 AM   Specimen: Nasopharyngeal Swab; Nasopharyngeal(NP) swabs in vial transport medium  Result Value Ref Range Status   SARS Coronavirus 2 by RT PCR NEGATIVE NEGATIVE Final    Comment: (NOTE) SARS-CoV-2 target nucleic acids are NOT DETECTED.  The SARS-CoV-2 RNA is generally detectable in upper respiratory specimens during the acute phase of infection. The lowest concentration of SARS-CoV-2 viral copies this assay can detect is 138 copies/mL. A negative result does not preclude SARS-Cov-2 infection and should not be used as the sole basis for treatment or other patient management decisions. A negative result may occur with  improper specimen collection/handling, submission of specimen other than nasopharyngeal swab, presence of viral mutation(s) within the areas targeted by this assay, and inadequate number of viral copies(<138 copies/mL). A negative result must be combined with clinical observations, patient history, and epidemiological information. The expected result is Negative.  Fact Sheet for Patients:  EntrepreneurPulse.com.au  Fact Sheet for Healthcare Providers:  IncredibleEmployment.be  This test is no t yet  approved or cleared by the [place] FDA and  has been authorized for detection and/or diagnosis of SARS-CoV-2 by FDA under an Emergency Use Authorization (EUA). This EUA will remain  in effect (meaning this test can be used) for the duration of the COVID-19 declaration under Section 564(b)(1) of the Act, 21 U.S.C.section 360bbb-3(b)(1), unless the authorization is terminated  or revoked sooner.       Influenza A by PCR NEGATIVE NEGATIVE Final   Influenza B by PCR NEGATIVE NEGATIVE Final    Comment: (NOTE) The Xpert Xpress SARS-CoV-2/FLU/RSV plus assay is intended as an aid in the diagnosis of influenza from Nasopharyngeal swab specimens and should not be used as a sole basis for treatment. Nasal washings and aspirates are unacceptable for Xpert Xpress SARS-CoV-2/FLU/RSV testing.  Fact Sheet for Patients: EntrepreneurPulse.com.au  Fact Sheet for Healthcare Providers: IncredibleEmployment.be  This test is not yet approved or cleared by the [place] FDA and has been authorized for detection and/or diagnosis of SARS-CoV-2 by FDA under an Emergency Use Authorization (EUA). This EUA will remain in effect (meaning this test can be used) for the duration of the COVID-19 declaration under Section 564(b)(1) of the Act, 21 U.S.C. section 360bbb-3(b)(1), unless the authorization is terminated or revoked.  Performed at [facility], [address] [place], [place] [address]          Radiology Studies: US Venous Img Lower Unilateral Right (DVT)  Result Date: 05/23/2021 CLINICAL DATA:  Right leg swelling EXAM: RIGHT LOWER EXTREMITY VENOUS DOPPLER ULTRASOUND TECHNIQUE: Gray-scale sonography with compression, as well as color and duplex ultrasound, were performed to evaluate the deep venous system(s) from the level of the common femoral vein through the popliteal and proximal calf veins. COMPARISON:  None. FINDINGS: VENOUS Normal  compressibility of the common femoral, superficial femoral, and popliteal veins, as well as the visualized calf veins. Visualized portions of profunda femoral vein and great saphenous vein unremarkable. No filling defects to suggest DVT on grayscale or color Doppler imaging. Doppler waveforms show normal direction of venous flow, normal respiratory plasticity and response to augmentation. Limited views of the contralateral common femoral vein are unremarkable. OTHER None. Limitations: none IMPRESSION: Negative. Electronically Signed   By: Julian Hy M.D.   On: 05/23/2021 00:15   DG Chest Portable 1 View  Result Date: 05/22/2021 CLINICAL DATA:  Shortness of breath.  Hypoxia history of COPD. EXAM: PORTABLE CHEST 1 VIEW COMPARISON:  Report from chest radiograph 02/28/2021, images not available. FINDINGS: Mild hyperinflation. There is diffuse peribronchial thickening. Upper normal heart size with normal mediastinal contours. Minor streaky opacities at the lung bases. No confluent consolidation. No pleural fluid or pneumothorax. No acute osseous abnormalities are seen. Thoracic spondylosis with endplate spurring. IMPRESSION: 1. Hyperinflation and peribronchial thickening consistent with COPD. 2. Minor streaky opacities at the lung bases may represent atelectasis, scarring, or less likely pneumonia. Electronically Signed   By: Keith Rake M.D.   On: 05/22/2021 19:51        Scheduled Meds:  aspirin EC  81 mg Oral Daily   atenolol  50 mg Oral Daily   atorvastatin  40 mg Oral Daily   carbamazepine  200 mg Oral BID   divalproex  1,500 mg Oral QHS   donepezil  5 mg Oral QHS   enoxaparin (LOVENOX) injection  0.5 mg/kg Subcutaneous QHS   fluticasone furoate-vilanterol  1 puff Inhalation Daily   furosemide  20 mg Intravenous Daily   gabapentin  300 mg Oral QHS   ipratropium-albuterol  3 mL Nebulization Q6H   methylPREDNISolone (SOLU-MEDROL) injection  40 mg Intravenous Daily   montelukast  10 mg  Oral QHS   OLANZapine  5 mg Oral QHS   pantoprazole  40 mg Oral BID   cyanocobalamin  1,000 mcg Oral Daily   Continuous Infusions:   LOS: 1 day    Time spent: 40 min    Desma Maxim, MD [facility]   If 7PM-7AM, please contact night-coverage [email or web address] Password [facility] 05/23/2021, 7:37 AM

## 2021-05-24 ENCOUNTER — Encounter: Payer: Self-pay | Admitting: Family Medicine

## 2021-05-24 ENCOUNTER — Inpatient Hospital Stay
Admit: 2021-05-24 | Discharge: 2021-05-24 | Disposition: A | Discharge status: Home / Self Care | Payer: Medicare HMO | Attending: Family Medicine | Admitting: Family Medicine

## 2021-05-24 LAB — BASIC METABOLIC PANEL
Anion gap: 11 (ref 5–15)
BUN: 30 mg/dL — ABNORMAL HIGH (ref 8–23)
CO2: 33 mmol/L — ABNORMAL HIGH (ref 22–32)
Calcium: 8.7 mg/dL — ABNORMAL LOW (ref 8.9–10.3)
Chloride: 96 mmol/L — ABNORMAL LOW (ref 98–111)
Creatinine, Ser: 1.28 mg/dL — ABNORMAL HIGH (ref 0.61–1.24)
GFR, Estimated: 59 mL/min — ABNORMAL LOW (ref >60)
Glucose, Bld: 95 mg/dL (ref 70–99)
Potassium: 3.9 mmol/L (ref 3.5–5.1)
Sodium: 140 mmol/L (ref 135–145)

## 2021-05-24 LAB — CBC
HCT: 39 % (ref 39.0–52.0)
Hemoglobin: 13.1 g/dL (ref 13.0–17.0)
MCH: 31.3 pg (ref 26.0–34.0)
MCHC: 33.6 g/dL (ref 30.0–36.0)
MCV: 93.3 fL (ref 80.0–100.0)
Platelets: 158 10*3/uL (ref 150–400)
RBC: 4.18 MIL/uL — ABNORMAL LOW (ref 4.22–5.81)
RDW: 12.6 % (ref 11.5–15.5)
WBC: 13.6 10*3/uL — ABNORMAL HIGH (ref 4.0–10.5)
nRBC: 0 % (ref 0.0–0.2)

## 2021-05-24 MED ORDER — IPRATROPIUM-ALBUTEROL 0.5-2.5 (3) MG/3ML IN SOLN
3.0000 mL | Freq: Three times a day (TID) | RESPIRATORY_TRACT | Status: DC
  Administered 2021-05-24 – 2021-05-25 (×2): 3 mL via RESPIRATORY_TRACT
  Filled 2021-05-24 (×3): qty 3

## 2021-05-24 MED ORDER — ORAL CARE MOUTH RINSE
15.0000 mL | Freq: Two times a day (BID) | OROMUCOSAL | Status: DC
  Administered 2021-05-24 – 2021-05-25 (×2): 15 mL via OROMUCOSAL

## 2021-05-24 NOTE — Progress Notes (Signed)
PROGRESS NOTE    Bradley Bowers  [id number] DOB: 1946/04/20 DOA: 05/22/2021 PCP: Rusty Aus, MD  Outpatient Specialists: [facility] cardiology    Brief Narrative:    Bradley Bowers is a 75 y.o. male with medical history significant for COPD, CAD s/p right RCA stent, OSA not on CPAP, history of small vessel disease previously requiring Coumadin, bipolar 1 disorder, restless leg syndrome, hyperlipidemia and GERD who presents with concerns of increasing shortness of breath.   Patient reports he has had years of shortness of breath but today just awoke with acutely worsened dyspnea.  He states he cannot do anything around the house.  He does not wear oxygen at home.  States the last time he had oxygen was probably 10 years ago when he was placed on 2 L but later for some reason was able to come off.  He recently started  pulmonary rehab.  Had new cough today that he states went away after drinking water.  He denies any orthopnea or PND.  He is able to sleep flat on his left side in bed.  He denies any lower extremity edema but was noted to have some during exam which he later states is chronic.  Denies any chest pain or tightness.  He denies any calf pain.  Endorse he mostly sits around at home watching TV and sometimes working on the yard.  Denies any recent prolonged travel.   Assessment & Plan:   Principal Problem:   Acute respiratory failure with hypoxia (HCC) Active Problems:   Obstructive apnea   Coronary artery disease   COPD with acute exacerbation (HCC)   Hyperlipemia   Bipolar 1 disorder (HCC)   Renal insufficiency   Leg edema   # Acute hypoxic respiratory failure Likely O2 70s on arrival. PVLs neg for DVT. D dimer wnl. NO signs pneumonia. - cont o2, has been weaned to 1 L, cont to wean as able  # COPD with possible exacerbation Hx COPD. No sig wheezing on exam - cont duonebs and prednisone - cont home inhalers  # Possible CHF exacerbation Here with dyspnea  on exertion, mild orthopnea. BNP elevated, hypoxic. Though no sig LE edema, no pulmonary edema. 2017 TTE with EF of 50% - TTE pending, cont lasix 20 iv qd for now. Only one u/s tech so TTE hasn't yet been done, was ordered 2 days ago - strict I/os - maintain telemetry  # CKD 2/3 GFR 50s-60s, cr 1.1-1.4 over past year, here stable - monitor - holding lisinopril for now   # CAD s/p stent, history MI No chest pain, trops mildly elevated but flat, do not suspect ACS -continue aspirin, statin, atenolol  # HTN Here normotensive - holding lisinopril as above  # Dementia - continue aricept   # OSA -not on CPAP   # Bipolar disorder  -continue Tegretor, depakote  # Neuropathy - cont home gabapentin   DVT prophylaxis: lovenox Code Status: full Family Communication: wife updated @ bedside 8/18 Level of care: Progressive Cardiac Status is: Inpatient  Remains inpatient appropriate because:Inpatient level of care appropriate due to severity of illness  Dispo: The patient is from: Home              Anticipated d/c is to: Home              Patient currently is not medically stable to d/c.   Difficult to place patient No     Consultants:  none  Procedures: none  Antimicrobials:  none    Subjective: Feeling improved, breathing improved, tolerating diet, no chest pain  Objective: Vitals:   05/24/21 0550 05/24/21 0745 05/24/21 0859 05/24/21 1204  BP: (!) 144/83 (!) 142/68  (!) 123/55  Pulse: 60 66  61  Resp: '13 16  18  '$ Temp: 97.8 F (36.6 C) 97.9 F (36.6 C)  (!) 97.5 F (36.4 C)  TempSrc: Oral Oral  Oral  SpO2: 99% 98% 97% 95%  Weight:      Height:        Intake/Output Summary (Last 24 hours) at 05/24/2021 1338 Last data filed at 05/24/2021 1000 Gross per 24 hour  Intake 720 ml  Output 1200 ml  Net -480 ml   Filed Weights   05/22/21 1928 05/24/21 0500  Weight: 94.8 kg 96.2 kg    Examination:  General exam: Appears calm and comfortable  Respiratory  system: Clear to auscultation. Respiratory effort normal. Cardiovascular system: S1 & S2 heard, RRR. No JVD, murmurs, rubs, gallops or clicks. No pedal edema. Gastrointestinal system: Abdomen is obese, soft and nontender. No organomegaly or masses felt. Normal bowel sounds heard. Central nervous system: Alert and oriented. No focal neurological deficits. Extremities: Symmetric 5 x 5 power. Skin: No rashes, lesions or ulcers Psychiatry: Judgement and insight appear normal. Mood & affect appropriate.     Data Reviewed: I have personally reviewed following labs and imaging studies  CBC: Recent Labs  Lab 05/22/21 1941 05/23/21 0805 05/24/21 0407  WBC 11.3* 9.2 13.6*  NEUTROABS 8.1*  --   --   HGB 13.7 14.1 13.1  HCT 41.1 42.9 39.0  MCV 94.5 95.3 93.3  PLT 149* 147* 0000000   Basic Metabolic Panel: Recent Labs  Lab 05/22/21 1941 05/23/21 0805 05/24/21 0407  NA 136 140 140  K 5.0 5.2* 3.9  CL 96* 99 96*  CO2 31 32 33*  GLUCOSE 107* 116* 95  BUN 27* 26* 30*  CREATININE 1.37* 1.57* 1.28*  CALCIUM 8.9 8.8* 8.7*   GFR: Estimated Creatinine Clearance: 55.8 mL/min (A) (by C-G formula based on SCr of 1.28 mg/dL (H)). Liver Function Tests: Recent Labs  Lab 05/22/21 1941  AST 29  ALT 43  ALKPHOS 92  BILITOT 0.7  PROT 7.3  ALBUMIN 3.6   No results for input(s): LIPASE, AMYLASE in the last 168 hours. No results for input(s): AMMONIA in the last 168 hours. Coagulation Profile: No results for input(s): INR, PROTIME in the last 168 hours. Cardiac Enzymes: No results for input(s): CKTOTAL, CKMB, CKMBINDEX, TROPONINI in the last 168 hours. BNP (last 3 results) No results for input(s): PROBNP in the last 8760 hours. HbA1C: No results for input(s): HGBA1C in the last 72 hours. CBG: No results for input(s): GLUCAP in the last 168 hours. Lipid Profile: No results for input(s): CHOL, HDL, LDLCALC, TRIG, CHOLHDL, LDLDIRECT in the last 72 hours. Thyroid Function Tests: No results  for input(s): TSH, T4TOTAL, FREET4, T3FREE, THYROIDAB in the last 72 hours. Anemia Panel: No results for input(s): VITAMINB12, FOLATE, FERRITIN, TIBC, IRON, RETICCTPCT in the last 72 hours. Urine analysis:    Component Value Date/Time   COLORURINE YELLOW (A) 09/24/2016 1901   APPEARANCEUR HAZY (A) 09/24/2016 1901   APPEARANCEUR Clear 02/20/2016 1102   LABSPEC 1.018 09/24/2016 1901   PHURINE 5.0 09/24/2016 1901   GLUCOSEU NEGATIVE 09/24/2016 1901   HGBUR MODERATE (A) 09/24/2016 1901   BILIRUBINUR NEGATIVE 09/24/2016 1901   BILIRUBINUR Negative 02/20/2016 [place] 09/24/2016 1901  PROTEINUR NEGATIVE 09/24/2016 1901   NITRITE NEGATIVE 09/24/2016 1901   LEUKOCYTESUR NEGATIVE 09/24/2016 1901   LEUKOCYTESUR Negative 02/20/2016 1102   Sepsis Labs: '@LABRCNTIP'$ (procalcitonin:4,lacticidven:4)  ) Recent Results (from the past 240 hour(s))  Resp Panel by RT-PCR (Flu A&B, Covid) Nasopharyngeal Swab     Status: None   Collection Time: 05/23/21  1:59 AM   Specimen: Nasopharyngeal Swab; Nasopharyngeal(NP) swabs in vial transport medium  Result Value Ref Range Status   SARS Coronavirus 2 by RT PCR NEGATIVE NEGATIVE Final    Comment: (NOTE) SARS-CoV-2 target nucleic acids are NOT DETECTED.  The SARS-CoV-2 RNA is generally detectable in upper respiratory specimens during the acute phase of infection. The lowest concentration of SARS-CoV-2 viral copies this assay can detect is 138 copies/mL. A negative result does not preclude SARS-Cov-2 infection and should not be used as the sole basis for treatment or other patient management decisions. A negative result may occur with  improper specimen collection/handling, submission of specimen other than nasopharyngeal swab, presence of viral mutation(s) within the areas targeted by this assay, and inadequate number of viral copies(<138 copies/mL). A negative result must be combined with clinical observations, patient history, and  epidemiological information. The expected result is Negative.  Fact Sheet for Patients:  EntrepreneurPulse.com.au  Fact Sheet for Healthcare Providers:  IncredibleEmployment.be  This test is no t yet approved or cleared by the [place] FDA and  has been authorized for detection and/or diagnosis of SARS-CoV-2 by FDA under an Emergency Use Authorization (EUA). This EUA will remain  in effect (meaning this test can be used) for the duration of the COVID-19 declaration under Section 564(b)(1) of the Act, 21 U.S.C.section 360bbb-3(b)(1), unless the authorization is terminated  or revoked sooner.       Influenza A by PCR NEGATIVE NEGATIVE Final   Influenza B by PCR NEGATIVE NEGATIVE Final    Comment: (NOTE) The Xpert Xpress SARS-CoV-2/FLU/RSV plus assay is intended as an aid in the diagnosis of influenza from Nasopharyngeal swab specimens and should not be used as a sole basis for treatment. Nasal washings and aspirates are unacceptable for Xpert Xpress SARS-CoV-2/FLU/RSV testing.  Fact Sheet for Patients: EntrepreneurPulse.com.au  Fact Sheet for Healthcare Providers: IncredibleEmployment.be  This test is not yet approved or cleared by the [place] FDA and has been authorized for detection and/or diagnosis of SARS-CoV-2 by FDA under an Emergency Use Authorization (EUA). This EUA will remain in effect (meaning this test can be used) for the duration of the COVID-19 declaration under Section 564(b)(1) of the Act, 21 U.S.C. section 360bbb-3(b)(1), unless the authorization is terminated or revoked.  Performed at [facility], [address], [place], [place] [address]          Radiology Studies: US Venous Img Lower Unilateral Right (DVT)  Result Date: 05/23/2021 CLINICAL DATA:  Right leg swelling EXAM: RIGHT LOWER EXTREMITY VENOUS DOPPLER ULTRASOUND TECHNIQUE: Gray-scale sonography  with compression, as well as color and duplex ultrasound, were performed to evaluate the deep venous system(s) from the level of the common femoral vein through the popliteal and proximal calf veins. COMPARISON:  None. FINDINGS: VENOUS Normal compressibility of the common femoral, superficial femoral, and popliteal veins, as well as the visualized calf veins. Visualized portions of profunda femoral vein and great saphenous vein unremarkable. No filling defects to suggest DVT on grayscale or color Doppler imaging. Doppler waveforms show normal direction of venous flow, normal respiratory plasticity and response to augmentation. Limited views of the contralateral common femoral vein  are unremarkable. OTHER None. Limitations: none IMPRESSION: Negative. Electronically Signed   By: Julian Hy M.D.   On: 05/23/2021 00:15   DG Chest Portable 1 View  Result Date: 05/22/2021 CLINICAL DATA:  Shortness of breath.  Hypoxia history of COPD. EXAM: PORTABLE CHEST 1 VIEW COMPARISON:  Report from chest radiograph 02/28/2021, images not available. FINDINGS: Mild hyperinflation. There is diffuse peribronchial thickening. Upper normal heart size with normal mediastinal contours. Minor streaky opacities at the lung bases. No confluent consolidation. No pleural fluid or pneumothorax. No acute osseous abnormalities are seen. Thoracic spondylosis with endplate spurring. IMPRESSION: 1. Hyperinflation and peribronchial thickening consistent with COPD. 2. Minor streaky opacities at the lung bases may represent atelectasis, scarring, or less likely pneumonia. Electronically Signed   By: Keith Rake M.D.   On: 05/22/2021 19:51        Scheduled Meds:  aspirin EC  81 mg Oral Daily   atenolol  50 mg Oral Daily   atorvastatin  40 mg Oral Daily   carbamazepine  200 mg Oral BID   divalproex  1,500 mg Oral QHS   donepezil  5 mg Oral QHS   enoxaparin (LOVENOX) injection  0.5 mg/kg Subcutaneous QHS   fluticasone  furoate-vilanterol  1 puff Inhalation Daily   furosemide  20 mg Intravenous Daily   gabapentin  300 mg Oral QHS   ipratropium-albuterol  3 mL Nebulization Q6H   mouth rinse  15 mL Mouth Rinse BID   mometasone-formoterol  2 puff Inhalation BID   montelukast  10 mg Oral QHS   OLANZapine  5 mg Oral QHS   pantoprazole  40 mg Oral BID   predniSONE  40 mg Oral Q breakfast   tiotropium  18 mcg Inhalation Daily   cyanocobalamin  1,000 mcg Oral Daily   Continuous Infusions:   LOS: 2 days    Time spent: 40 min    Desma Maxim, MD [facility]   If 7PM-7AM, please contact night-coverage [email or web address] Password [facility] 05/24/2021, 1:38 PM

## 2021-05-24 NOTE — Progress Notes (Signed)
*  PRELIMINARY RESULTS* Echocardiogram 2D Echocardiogram has been performed.  Sherrie Sport 05/24/2021, 2:47 PM

## 2021-05-25 LAB — BASIC METABOLIC PANEL
Anion gap: 10 (ref 5–15)
BUN: 33 mg/dL — ABNORMAL HIGH (ref 8–23)
CO2: 34 mmol/L — ABNORMAL HIGH (ref 22–32)
Calcium: 8.9 mg/dL (ref 8.9–10.3)
Chloride: 96 mmol/L — ABNORMAL LOW (ref 98–111)
Creatinine, Ser: 1.27 mg/dL — ABNORMAL HIGH (ref 0.61–1.24)
GFR, Estimated: 60 mL/min — ABNORMAL LOW (ref >60)
Glucose, Bld: 89 mg/dL (ref 70–99)
Potassium: 4.7 mmol/L (ref 3.5–5.1)
Sodium: 140 mmol/L (ref 135–145)

## 2021-05-25 MED ORDER — ACCU-CHEK SMARTVIEW VI STRP: ORAL_STRIP | 12 refills | Status: DC

## 2021-05-25 MED ORDER — FUROSEMIDE 20 MG PO TABS: 20.0000 mg | ORAL_TABLET | Freq: Every day | ORAL | 11 refills | Status: DC

## 2021-05-25 MED ORDER — GLIMEPIRIDE 1 MG PO TABS: 1.0000 mg | ORAL_TABLET | ORAL | 11 refills | Status: DC

## 2021-05-25 MED ORDER — PREDNISONE 20 MG PO TABS: 40.0000 mg | ORAL_TABLET | Freq: Every day | ORAL | 0 refills | Status: DC

## 2021-05-25 MED ORDER — PREDNISONE 10 MG PO TABS
40.0000 mg | ORAL_TABLET | Freq: Every day | ORAL | 0 refills | Status: DC
Start: 2021-05-26 — End: 2021-05-25

## 2021-05-25 MED ORDER — ACCU-CHEK SOFTCLIX LANCETS MISC
100.0000 | Freq: Four times a day (QID) | 12 refills | Status: DC
Start: 2021-05-25 — End: 2021-05-25

## 2021-05-25 MED ORDER — ACCU-CHEK NANO SMARTVIEW W/DEVICE KIT: 1.0000 | PACK | 0 refills | Status: DC

## 2021-05-25 NOTE — Care Management Important Message (Signed)
Important Message  Patient Details  Name: REISS GRANADE MRN: [id number] Date of Birth: 21-Apr-1946   Medicare Important Message Given:  Yes     Dannette Barbara 05/25/2021, 1:10 PM

## 2021-05-25 NOTE — Progress Notes (Signed)
SATURATION QUALIFICATIONS: (This note is used to comply with regulatory documentation for home oxygen)  Patient Saturations on Room Air at Rest = 92%  Patient Saturations on Room Air while Ambulating = 84%  Patient Saturations on 2 Liters of oxygen while Ambulating = 94%  Please briefly explain why patient needs home oxygen: Patient unable to maintain adequate oxygenation on room air while ambulating

## 2021-05-25 NOTE — Progress Notes (Addendum)
Patient needs home o2. Ordered home o2 through [employer]. Informed her patient is ready for discharge. Updated patient's wife. Provided wife's number to [employer] for follow up.  Oleh Genin, [place]

## 2021-05-25 NOTE — Discharge Summary (Signed)
Bradley Bowers [id number] DOB: 1945/10/20 DOA: 05/22/2021  PCP: Bradley Aus, MD  Admit date: 05/22/2021 Discharge date: 05/25/2021  Time spent: 40 minutes  Recommendations for Outpatient Follow-up:  Close f/u with pcp and cardiology Check of kidney function and electrolytes in 1 week     Discharge Diagnoses:  Principal Problem:   Acute respiratory failure with hypoxia (Bradley Bowers) Active Problems:   Obstructive apnea   Coronary artery disease   COPD with acute exacerbation (Bradley Bowers)   Hyperlipemia   Bipolar 1 disorder (Bradley Bowers)   Renal insufficiency   Leg edema   Discharge Condition: stable  Diet recommendation: heart healthy  Filed Weights   05/22/21 1928 05/24/21 0500 05/25/21 0114  Weight: 94.8 kg 96.2 kg 97 kg    History of present illness:  Bradley Bowers is a 75 y.o. male with medical history significant for COPD, CAD s/p right RCA stent, OSA not on CPAP, history of small vessel disease previously requiring Coumadin, bipolar 1 disorder, restless leg syndrome, hyperlipidemia and GERD who presents with concerns of increasing shortness of breath.   Patient reports he has had years of shortness of breath but today just awoke with acutely worsened dyspnea.  He states he cannot do anything around the house.  He does not wear oxygen at home.  States the last time he had oxygen was probably 10 years ago when he was placed on 2 L but later for some reason was able to come off.  He recently started  pulmonary rehab.  Had new cough today that he states went away after drinking water.  He denies any orthopnea or PND.  He is able to sleep flat on his left side in bed.  He denies any lower extremity edema but was noted to have some during exam which he later states is chronic.  Denies any chest pain or tightness.  He denies any calf pain.  Endorse he mostly sits around at home watching TV and sometimes working on the yard.  Denies any recent prolonged travel.  Hospital Course:  # Acute  hypoxic respiratory failure Likely O2 70s on arrival. PVLs neg for DVT. D dimer wnl. NO signs pneumonia. Weaned to 92% w/o o2 at rest but drops to mid 80s with ambulation; home o2 arranged   # COPD with possible exacerbation Hx COPD. No sig wheezing on exam - 1 more day prednisone; continue home meds   # Possible CHF exacerbation Here with dyspnea on exertion, mild orthopnea. BNP elevated, hypoxic. Though no sig LE edema, no pulmonary edema. 2017 TTE with EF of 50%, here TTE performed but read is pending. Diuresed and tolerated it. - start lasix - close cardiology f/u, will need review of TTE   # CKD 2/3 GFR 50s-60s, cr 1.1-1.4 over past year, here stable   # CAD s/p stent, history MI No chest pain, trops mildly elevated but flat, do not suspect ACS -continued aspirin, statin, atenolol   # OSA -not on CPAP, needs to be   # Bipolar disorder  -continue Tegretor, depakote   Procedures: none   Consultations: none  Discharge Exam: Vitals:   05/25/21 0822 05/25/21 1121  BP: (!) 146/75 117/63  Pulse: 79 61  Resp: 18 18  Temp: 97.7 F (36.5 C) 97.6 F (36.4 C)  SpO2: 92% 95%    General exam: Appears calm and comfortable  Respiratory system: Clear to auscultation. Respiratory effort normal. Cardiovascular system: S1 & S2 heard, RRR. No JVD, murmurs, rubs, gallops or clicks.  No pedal edema. Gastrointestinal system: Abdomen is obese, soft and nontender. No organomegaly or masses felt. Normal bowel sounds heard. Central nervous system: Alert and oriented. No focal neurological deficits. Extremities: Symmetric 5 x 5 power. Skin: No rashes, lesions or ulcers Psychiatry: Judgement and insight appear normal. Mood & affect appropriate.   Discharge Instructions   Discharge Instructions     Diet - low sodium heart healthy   Complete by: As directed    Diet - low sodium heart healthy   Complete by: As directed    Increase activity slowly   Complete by: As directed     Increase activity slowly   Complete by: As directed       Allergies as of 05/25/2021   No Known Allergies      Medication List     TAKE these medications    Advair Diskus 100-50 MCG/ACT Aepb Generic drug: fluticasone-salmeterol Inhale 1 puff into the lungs 2 (two) times daily.   albuterol 108 (90 Base) MCG/ACT inhaler Commonly known as: VENTOLIN HFA Inhale 1-2 puffs into the lungs every 4 (four) hours as needed.   aspirin 81 MG EC tablet Take 81 mg by mouth daily.   atenolol 50 MG tablet Commonly known as: TENORMIN Take 50 mg by mouth daily.   atorvastatin 80 MG tablet Commonly known as: LIPITOR Take 40 mg by mouth daily.   carbamazepine 200 MG tablet Commonly known as: TEGRETOL Take 1 tablet by mouth 2 (two) times daily.   cyanocobalamin 1000 MCG tablet Take 1 tablet (1,000 mcg total) by mouth daily.   divalproex 500 MG 24 hr tablet Commonly known as: DEPAKOTE ER Take 3 tablets (1,500 mg total) by mouth at bedtime.   donepezil 5 MG tablet Commonly known as: ARICEPT Take 5 mg by mouth at bedtime.   fluticasone 50 MCG/ACT nasal spray Commonly known as: FLONASE Place 1 spray into both nostrils daily.   fluticasone furoate-vilanterol 100-25 MCG/INH Aepb Commonly known as: BREO ELLIPTA Inhale 1 puff into the lungs daily.   furosemide 20 MG tablet Commonly known as: Lasix Take 1 tablet (20 mg total) by mouth daily.   gabapentin 300 MG capsule Commonly known as: NEURONTIN Take 300 mg by mouth at bedtime.   lisinopril 5 MG tablet Commonly known as: ZESTRIL Take 1 tablet by mouth daily.   montelukast 10 MG tablet Commonly known as: SINGULAIR Take 10 mg by mouth at bedtime.   OLANZapine 5 MG tablet Commonly known as: ZYPREXA Take 5 mg by mouth at bedtime.   pantoprazole 40 MG tablet Commonly known as: PROTONIX Take 40 mg by mouth 2 (two) times daily.   predniSONE 20 MG tablet Commonly known as: DELTASONE Take 2 tablets (40 mg total) by mouth  daily with breakfast. Start taking on: May 26, 2021   tiotropium 18 MCG inhalation capsule Commonly known as: SPIRIVA Place 18 mcg into inhaler and inhale daily.               Durable Medical Equipment  (From admission, onward)           Start     Ordered   05/25/21 1401  DME Oxygen  Once       Question Answer Comment  Length of Need 6 Months   Liters per Minute 2   Frequency Continuous (stationary and portable oxygen unit needed)   Oxygen delivery system Gas      05/25/21 1400           No  Known Allergies  Follow-up Information     Bradley Aus, MD. Schedule an appointment as soon as possible for a visit on 05/30/2021.   Specialty: Internal Medicine Why: @ 1:45pm Contact information: [address] [place] [id number] [phone]         Teodoro Spray, MD. Schedule an appointment as soon as possible for a visit.   Specialty: Cardiology Contact information: [place]  [address] [phone]                  The results of significant diagnostics from this hospitalization (including imaging, microbiology, ancillary and laboratory) are listed below for reference.    Significant Diagnostic Studies: US Venous Img Lower Unilateral Right (DVT)  Result Date: 05/23/2021 CLINICAL DATA:  Right leg swelling EXAM: RIGHT LOWER EXTREMITY VENOUS DOPPLER ULTRASOUND TECHNIQUE: Gray-scale sonography with compression, as well as color and duplex ultrasound, were performed to evaluate the deep venous system(s) from the level of the common femoral vein through the popliteal and proximal calf veins. COMPARISON:  None. FINDINGS: VENOUS Normal compressibility of the common femoral, superficial femoral, and popliteal veins, as well as the visualized calf veins. Visualized portions of profunda femoral vein and great saphenous vein unremarkable. No filling defects to suggest DVT on grayscale or  color Doppler imaging. Doppler waveforms show normal direction of venous flow, normal respiratory plasticity and response to augmentation. Limited views of the contralateral common femoral vein are unremarkable. OTHER None. Limitations: none IMPRESSION: Negative. Electronically Signed   By: Julian Hy M.D.   On: 05/23/2021 00:15   DG Chest Portable 1 View  Result Date: 05/22/2021 CLINICAL DATA:  Shortness of breath.  Hypoxia history of COPD. EXAM: PORTABLE CHEST 1 VIEW COMPARISON:  Report from chest radiograph 02/28/2021, images not available. FINDINGS: Mild hyperinflation. There is diffuse peribronchial thickening. Upper normal heart size with normal mediastinal contours. Minor streaky opacities at the lung bases. No confluent consolidation. No pleural fluid or pneumothorax. No acute osseous abnormalities are seen. Thoracic spondylosis with endplate spurring. IMPRESSION: 1. Hyperinflation and peribronchial thickening consistent with COPD. 2. Minor streaky opacities at the lung bases may represent atelectasis, scarring, or less likely pneumonia. Electronically Signed   By: Keith Rake M.D.   On: 05/22/2021 19:51    Microbiology: Recent Results (from the past 240 hour(s))  Resp Panel by RT-PCR (Flu A&B, Covid) Nasopharyngeal Swab     Status: None   Collection Time: 05/23/21  1:59 AM   Specimen: Nasopharyngeal Swab; Nasopharyngeal(NP) swabs in vial transport medium  Result Value Ref Range Status   SARS Coronavirus 2 by RT PCR NEGATIVE NEGATIVE Final    Comment: (NOTE) SARS-CoV-2 target nucleic acids are NOT DETECTED.  The SARS-CoV-2 RNA is generally detectable in upper respiratory specimens during the acute phase of infection. The lowest concentration of SARS-CoV-2 viral copies this assay can detect is 138 copies/mL. A negative result does not preclude SARS-Cov-2 infection and should not be used as the sole basis for treatment or other patient management decisions. A negative result  may occur with  improper specimen collection/handling, submission of specimen other than nasopharyngeal swab, presence of viral mutation(s) within the areas targeted by this assay, and inadequate number of viral copies(<138 copies/mL). A negative result must be combined with clinical observations, patient history, and epidemiological information. The expected result is Negative.  Fact Sheet for Patients:  EntrepreneurPulse.com.au  Fact Sheet for Healthcare Providers:  IncredibleEmployment.be  This test is no t  yet approved or cleared by the [place] and  has been authorized for detection and/or diagnosis of SARS-CoV-2 by FDA under an Emergency Use Authorization (EUA). This EUA will remain  in effect (meaning this test can be used) for the duration of the COVID-19 declaration under Section 564(b)(1) of the Act, 21 U.S.C.section 360bbb-3(b)(1), unless the authorization is terminated  or revoked sooner.       Influenza A by PCR NEGATIVE NEGATIVE Final   Influenza B by PCR NEGATIVE NEGATIVE Final    Comment: (NOTE) The Xpert Xpress SARS-CoV-2/FLU/RSV plus assay is intended as an aid in the diagnosis of influenza from Nasopharyngeal swab specimens and should not be used as a sole basis for treatment. Nasal washings and aspirates are unacceptable for Xpert Xpress SARS-CoV-2/FLU/RSV testing.  Fact Sheet for Patients: EntrepreneurPulse.com.au  Fact Sheet for Healthcare Providers: IncredibleEmployment.be  This test is not yet approved or cleared by the [place] FDA and has been authorized for detection and/or diagnosis of SARS-CoV-2 by FDA under an Emergency Use Authorization (EUA). This EUA will remain in effect (meaning this test can be used) for the duration of the COVID-19 declaration under Section 564(b)(1) of the Act, 21 U.S.C. section 360bbb-3(b)(1), unless the authorization is terminated  or revoked.  Performed at [facility], [address] [place],  [address]      Labs: Basic Metabolic Panel: Recent Labs  Lab 05/22/21 1941 05/23/21 0805 05/24/21 0407 05/25/21 0517  NA 136 140 140 140  K 5.0 5.2* 3.9 4.7  CL 96* 99 96* 96*  CO2 31 32 33* 34*  GLUCOSE 107* 116* 95 89  BUN 27* 26* 30* 33*  CREATININE 1.37* 1.57* 1.28* 1.27*  CALCIUM 8.9 8.8* 8.7* 8.9   Liver Function Tests: Recent Labs  Lab 05/22/21 1941  AST 29  ALT 43  ALKPHOS 92  BILITOT 0.7  PROT 7.3  ALBUMIN 3.6   No results for input(s): LIPASE, AMYLASE in the last 168 hours. No results for input(s): AMMONIA in the last 168 hours. CBC: Recent Labs  Lab 05/22/21 1941 05/23/21 0805 05/24/21 0407  WBC 11.3* 9.2 13.6*  NEUTROABS 8.1*  --   --   HGB 13.7 14.1 13.1  HCT 41.1 42.9 39.0  MCV 94.5 95.3 93.3  PLT 149* 147* 158   Cardiac Enzymes: No results for input(s): CKTOTAL, CKMB, CKMBINDEX, TROPONINI in the last 168 hours. BNP: BNP (last 3 results) Recent Labs    05/22/21 1941  BNP 689.2*    ProBNP (last 3 results) No results for input(s): PROBNP in the last 8760 hours.  CBG: No results for input(s): GLUCAP in the last 168 hours.     Signed:  Desma Maxim MD.  [facility] 05/25/2021, 2:02 PM

## 2021-05-28 ENCOUNTER — Telehealth: Payer: Self-pay

## 2021-05-28 NOTE — Telephone Encounter (Signed)
Spoke with Bradley Bowers is on oxygen now after going to the ED last week.  He sees PCP Wednesday and plans to return 8/30.  Company coming to get home oxygen set up.

## 2021-05-29 DIAGNOSIS — J449 Chronic obstructive pulmonary disease, unspecified: Secondary | ICD-10-CM | POA: Diagnosis not present

## 2021-05-29 DIAGNOSIS — G4733 Obstructive sleep apnea (adult) (pediatric): Secondary | ICD-10-CM | POA: Diagnosis not present

## 2021-05-30 DIAGNOSIS — I2781 Cor pulmonale (chronic): Secondary | ICD-10-CM | POA: Diagnosis not present

## 2021-05-30 DIAGNOSIS — F319 Bipolar disorder, unspecified: Secondary | ICD-10-CM | POA: Diagnosis not present

## 2021-05-30 DIAGNOSIS — J9610 Chronic respiratory failure, unspecified whether with hypoxia or hypercapnia: Secondary | ICD-10-CM | POA: Diagnosis not present

## 2021-05-30 LAB — ECHOCARDIOGRAM COMPLETE
AR max vel: 2.14 cm2
AV Area VTI: 1.82 cm2
AV Area mean vel: 1.93 cm2
AV Mean grad: 5 mmHg
AV Peak grad: 8.8 mmHg
Ao pk vel: 1.48 m/s
Area-P 1/2: 3.48 cm2
Height: 66 in
S' Lateral: 2.78 cm
Weight: 3393.32 oz

## 2021-05-31 ENCOUNTER — Other Ambulatory Visit: Payer: Self-pay | Admitting: Internal Medicine

## 2021-05-31 ENCOUNTER — Other Ambulatory Visit (HOSPITAL_BASED_OUTPATIENT_CLINIC_OR_DEPARTMENT_OTHER): Payer: Self-pay | Admitting: Internal Medicine

## 2021-05-31 DIAGNOSIS — J9611 Chronic respiratory failure with hypoxia: Secondary | ICD-10-CM

## 2021-06-06 ENCOUNTER — Telehealth: Payer: Self-pay

## 2021-06-06 ENCOUNTER — Other Ambulatory Visit: Payer: Self-pay | Admitting: Pulmonary Disease

## 2021-06-06 ENCOUNTER — Other Ambulatory Visit (HOSPITAL_COMMUNITY): Payer: Self-pay | Admitting: Pulmonary Disease

## 2021-06-06 DIAGNOSIS — J9601 Acute respiratory failure with hypoxia: Secondary | ICD-10-CM | POA: Diagnosis not present

## 2021-06-06 DIAGNOSIS — J449 Chronic obstructive pulmonary disease, unspecified: Secondary | ICD-10-CM

## 2021-06-06 NOTE — Progress Notes (Unsigned)
Bradley Bowers saw lung Dr today and they want him to wait until he's better to return.  His wife said to take him out for two weeks and they will call when he's ready to return

## 2021-06-06 NOTE — Telephone Encounter (Signed)
Bradley Bowers saw lung Dr today and they want him to wait until he gets a little better to return.  Staff will take him out for two weeks and they will call when he can return.

## 2021-06-13 ENCOUNTER — Encounter: Payer: Self-pay | Admitting: *Deleted

## 2021-06-13 DIAGNOSIS — J449 Chronic obstructive pulmonary disease, unspecified: Secondary | ICD-10-CM

## 2021-06-13 NOTE — Progress Notes (Signed)
Pulmonary Individual Treatment Plan  Patient Details  Name: Bradley Bowers MRN: [id number] Date of Birth: 1945-12-15 Referring Provider:   April Manson Pulmonary Rehab from 04/05/2021 in [facility] Cardiac and Pulmonary Rehab  Referring Provider Emily Filbert MD       Initial Encounter Date:  Flowsheet Row Pulmonary Rehab from 04/05/2021 in [facility] Cardiac and Pulmonary Rehab  Date 04/05/21       Visit Diagnosis: Chronic obstructive pulmonary disease, unspecified COPD type ([place])  Patient's Home Medications on Admission:  Current Outpatient Medications:    ADVAIR DISKUS 100-50 MCG/ACT AEPB, Inhale 1 puff into the lungs 2 (two) times daily., Disp: , Rfl:    albuterol (VENTOLIN HFA) 108 (90 Base) MCG/ACT inhaler, Inhale 1-2 puffs into the lungs every 4 (four) hours as needed., Disp: , Rfl:    aspirin 81 MG EC tablet, Take 81 mg by mouth daily., Disp: , Rfl: 11   atenolol (TENORMIN) 50 MG tablet, Take 50 mg by mouth daily., Disp: , Rfl:    atorvastatin (LIPITOR) 80 MG tablet, Take 40 mg by mouth daily., Disp: , Rfl:    carbamazepine (TEGRETOL) 200 MG tablet, Take 1 tablet by mouth 2 (two) times daily., Disp: , Rfl:    divalproex (DEPAKOTE ER) 500 MG 24 hr tablet, Take 3 tablets (1,500 mg total) by mouth at bedtime., Disp: 90 tablet, Rfl: 1   donepezil (ARICEPT) 5 MG tablet, Take 5 mg by mouth at bedtime., Disp: , Rfl:    fluticasone (FLONASE) 50 MCG/ACT nasal spray, Place 1 spray into both nostrils daily., Disp: , Rfl:    fluticasone furoate-vilanterol (BREO ELLIPTA) 100-25 MCG/INH AEPB, Inhale 1 puff into the lungs daily., Disp: , Rfl:    furosemide (LASIX) 20 MG tablet, Take 1 tablet (20 mg total) by mouth daily., Disp: 60 tablet, Rfl: 11   gabapentin (NEURONTIN) 300 MG capsule, Take 300 mg by mouth at bedtime., Disp: , Rfl:    lisinopril (ZESTRIL) 5 MG tablet, Take 1 tablet by mouth daily., Disp: , Rfl:    montelukast (SINGULAIR) 10 MG tablet, Take 10 mg by mouth at bedtime., Disp: , Rfl:     OLANZapine (ZYPREXA) 5 MG tablet, Take 5 mg by mouth at bedtime., Disp: , Rfl:    pantoprazole (PROTONIX) 40 MG tablet, Take 40 mg by mouth 2 (two) times daily., Disp: , Rfl:    predniSONE (DELTASONE) 20 MG tablet, Take 2 tablets (40 mg total) by mouth daily with breakfast., Disp: 2 tablet, Rfl: 0   tiotropium (SPIRIVA) 18 MCG inhalation capsule, Place 18 mcg into inhaler and inhale daily., Disp: , Rfl:    vitamin B-12 1000 MCG tablet, Take 1 tablet (1,000 mcg total) by mouth daily., Disp: 30 tablet, Rfl: 1  Past Medical History: Past Medical History:  Diagnosis Date   Arterial vascular disease 10/24/2014   Overview:  MI 1994, small-vessel disease requiring coumadin    Asthma    Centrilobular emphysema ([place]) 10/24/2014   Overview:  2L O2 prn    Centrilobular emphysema ([facility])    Chronic obstructive pulmonary disease ([place]) 11/06/2015   Coronary artery disease    GERD (gastroesophageal reflux disease)    Glaucoma    Heart attack ([place]) 2/17, 1994   Heart disease    Hyperlipemia    Hypertension    Restless leg 10/24/2014   Sleep apnea     Tobacco Use: Social History   Tobacco Use  Smoking Status Former   Packs/day: 1.00   Years: 30.00  Pack years: 30.00   Types: Cigarettes   Quit date: 03/07/1994   Years since quitting: 27.2  Smokeless Tobacco Never    Labs: Recent Review Flowsheet Data     Labs for ITP Cardiac and Pulmonary Rehab Latest Ref Rng & Units 09/24/2016   Cholestrol 0 - 200 mg/dL 177   LDLCALC 0 - 99 mg/dL 111(H)   HDL >40 mg/dL 41   Trlycerides <150 mg/dL 126   Hemoglobin A1c 4.8 - 5.6 % 5.2        Pulmonary Assessment Scores:  Pulmonary Assessment Scores     Row Name 04/05/21 0945         ADL UCSD   ADL Phase Entry     SOB Score total 19     Rest 0     Walk 2     Stairs 3     Bath 0     Dress 1     Shop 0           CAT Score   CAT Score 16           mMRC Score   mMRC Score 2              UCSD: Self-administered rating of  dyspnea associated with activities of daily living (ADLs) 6-point scale (0 = "not at all" to 5 = "maximal or unable to do because of breathlessness")  Scoring Scores range from 0 to 120.  Minimally important difference is 5 units  CAT: CAT can identify the health impairment of COPD patients and is better correlated with disease progression.  CAT has a scoring range of zero to 40. The CAT score is classified into four groups of low (less than 10), medium (10 - 20), high (21-30) and very high (31-40) based on the impact level of disease on health status. A CAT score over 10 suggests significant symptoms.  A worsening CAT score could be explained by an exacerbation, poor medication adherence, poor inhaler technique, or progression of COPD or comorbid conditions.  CAT MCID is 2 points  mMRC: mMRC (Modified Medical Research Council) Dyspnea Scale is used to assess the degree of baseline functional disability in patients of respiratory disease due to dyspnea. No minimal important difference is established. A decrease in score of 1 point or greater is considered a positive change.   Pulmonary Function Assessment:  Pulmonary Function Assessment - 03/26/21 1403       Breath   Shortness of Breath Yes;Limiting activity             Exercise Target Goals: Exercise Program Goal: Individual exercise prescription set using results from initial 6 min walk test and THRR while considering  patient's activity barriers and safety.   Exercise Prescription Goal: Initial exercise prescription builds to 30-45 minutes a day of aerobic activity, 2-3 days per week.  Home exercise guidelines will be given to patient during program as part of exercise prescription that the participant will acknowledge.  Education: Aerobic Exercise: - Group verbal and visual presentation on the components of exercise prescription. Introduces F.I.T.T principle from ACSM for exercise prescriptions.  Reviews F.I.T.T. principles of  aerobic exercise including progression. Written material given at graduation.   Education: Resistance Exercise: - Group verbal and visual presentation on the components of exercise prescription. Introduces F.I.T.T principle from ACSM for exercise prescriptions  Reviews F.I.T.T. principles of resistance exercise including progression. Written material given at graduation.    Education: Exercise & Equipment Safety: - Individual  verbal instruction and demonstration of equipment use and safety with use of the equipment. Flowsheet Row Pulmonary Rehab from 05/10/2021 in [facility] Cardiac and Pulmonary Rehab  Date 03/26/21  Educator [facility]  Instruction Review Code 1- Verbalizes Understanding       Education: Exercise Physiology & General Exercise Guidelines: - Group verbal and written instruction with models to review the exercise physiology of the cardiovascular system and associated critical values. Provides general exercise guidelines with specific guidelines to those with heart or lung disease.    Education: Flexibility, Balance, Mind/Body Relaxation: - Group verbal and visual presentation with interactive activity on the components of exercise prescription. Introduces F.I.T.T principle from ACSM for exercise prescriptions. Reviews F.I.T.T. principles of flexibility and balance exercise training including progression. Also discusses the mind body connection.  Reviews various relaxation techniques to help reduce and manage stress (i.e. Deep breathing, progressive muscle relaxation, and visualization). Balance handout provided to take home. Written material given at graduation. Flowsheet Row Pulmonary Rehab from 05/10/2021 in [facility] Cardiac and Pulmonary Rehab  Date 04/12/21  Educator [facility]  Instruction Review Code 1- Verbalizes Understanding       Activity Barriers & Risk Stratification:  Activity Barriers & Cardiac Risk Stratification - 04/05/21 0944       Activity Barriers & Cardiac Risk  Stratification   Activity Barriers Shortness of Breath;Deconditioning;Muscular Weakness             6 Minute Walk:  6 Minute Walk     Row Name 04/05/21 0953         6 Minute Walk   Phase Initial     Distance 400 feet     Walk Time 2 minutes  Stopped early due to low O2 sats     # of Rest Breaks 0     MPH 2.27     METS 1.13     RPE 15     Perceived Dyspnea  2     VO2 Peak 3.96     Symptoms Yes (comment)     Comments SOB     Resting HR 84 bpm     Resting BP 132/72     Resting Oxygen Saturation  94 %     Exercise Oxygen Saturation  during 6 min walk 80 %  Stopped early     Max Ex. HR 104 bpm     Max Ex. BP 162/70     2 Minute Post BP 140/70           Interval HR   1 Minute HR 94     2 Minute HR 104     3 Minute HR --  Test stopped early     4 Minute HR --  Test stopped early     5 Minute HR --  Test stopped early     6 Minute HR --  Test stopped early     2 Minute Post HR 88     Interval Heart Rate? Yes           Interval Oxygen   Interval Oxygen? Yes     Baseline Oxygen Saturation % 94 %     1 Minute Oxygen Saturation % 87 %     1 Minute Liters of Oxygen 0 L  RA     2 Minute Oxygen Saturation % 80 %  Test stopped     2 Minute Liters of Oxygen 0 L     3 Minute Liters of Oxygen 0 L  4 Minute Liters of Oxygen 0 L     5 Minute Liters of Oxygen 0 L     6 Minute Liters of Oxygen 0 L     2 Minute Post Oxygen Saturation % 90 %     2 Minute Post Liters of Oxygen 0 L             Oxygen Initial Assessment:  Oxygen Initial Assessment - 04/05/21 0945       Home Oxygen   Home Oxygen Device None    Sleep Oxygen Prescription None    Home Exercise Oxygen Prescription None    Home Resting Oxygen Prescription None    Compliance with Home Oxygen Use Yes      Initial 6 min Walk   Oxygen Used None      Program Oxygen Prescription   Program Oxygen Prescription None      Intervention   Short Term Goals To learn and understand importance of monitoring SPO2  with pulse oximeter and demonstrate accurate use of the pulse oximeter.;To learn and exhibit compliance with exercise, home and travel O2 prescription;To learn and understand importance of maintaining oxygen saturations>88%;To learn and demonstrate proper pursed lip breathing techniques or other breathing techniques. ;To learn and demonstrate proper use of respiratory medications    Long  Term Goals Exhibits compliance with exercise, home  and travel O2 prescription;Maintenance of O2 saturations>88%;Verbalizes importance of monitoring SPO2 with pulse oximeter and return demonstration;Exhibits proper breathing techniques, such as pursed lip breathing or other method taught during program session;Compliance with respiratory medication;Demonstrates proper use of MDI's             Oxygen Re-Evaluation:  Oxygen Re-Evaluation     Row Name 04/19/21 1103             Program Oxygen Prescription   Program Oxygen Prescription None               Home Oxygen   Home Oxygen Device None       Sleep Oxygen Prescription None       Home Exercise Oxygen Prescription None  Patient approved to drop no less than 83% on RA, per Dr. Sabra Heck       Home Resting Oxygen Prescription None       Compliance with Home Oxygen Use Yes               Goals/Expected Outcomes   Short Term Goals To learn and understand importance of monitoring SPO2 with pulse oximeter and demonstrate accurate use of the pulse oximeter.;To learn and exhibit compliance with exercise, home and travel O2 prescription;To learn and understand importance of maintaining oxygen saturations>88%;To learn and demonstrate proper pursed lip breathing techniques or other breathing techniques. ;To learn and demonstrate proper use of respiratory medications       Long  Term Goals Exhibits compliance with exercise, home  and travel O2 prescription;Maintenance of O2 saturations>88%;Verbalizes importance of monitoring SPO2 with pulse oximeter and return  demonstration;Exhibits proper breathing techniques, such as pursed lip breathing or other method taught during program session;Compliance with respiratory medication;Demonstrates proper use of MDI's       Comments Per Dr. Sabra Heck, "He may exercise without O2- stop as he needs during exercise. Only stop exercise if pulse ox drops less than 83%." Note in chart for documentation.                Oxygen Discharge (Final Oxygen Re-Evaluation):  Oxygen Re-Evaluation - 04/19/21 1103  Program Oxygen Prescription   Program Oxygen Prescription None      Home Oxygen   Home Oxygen Device None    Sleep Oxygen Prescription None    Home Exercise Oxygen Prescription None   Patient approved to drop no less than 83% on RA, per Dr. Sabra Heck   Home Resting Oxygen Prescription None    Compliance with Home Oxygen Use Yes      Goals/Expected Outcomes   Short Term Goals To learn and understand importance of monitoring SPO2 with pulse oximeter and demonstrate accurate use of the pulse oximeter.;To learn and exhibit compliance with exercise, home and travel O2 prescription;To learn and understand importance of maintaining oxygen saturations>88%;To learn and demonstrate proper pursed lip breathing techniques or other breathing techniques. ;To learn and demonstrate proper use of respiratory medications    Long  Term Goals Exhibits compliance with exercise, home  and travel O2 prescription;Maintenance of O2 saturations>88%;Verbalizes importance of monitoring SPO2 with pulse oximeter and return demonstration;Exhibits proper breathing techniques, such as pursed lip breathing or other method taught during program session;Compliance with respiratory medication;Demonstrates proper use of MDI's    Comments Per Dr. Sabra Heck, "He may exercise without O2- stop as he needs during exercise. Only stop exercise if pulse ox drops less than 83%." Note in chart for documentation.             Initial Exercise Prescription:   Initial Exercise Prescription - 04/05/21 0900       Date of Initial Exercise RX and Referring Provider   Date 04/05/21    Referring Provider Emily Filbert MD      Treadmill   MPH 1.7    Grade 0    Minutes 15    METs 2.3      REL-XR   Level 1    Speed 50    Minutes 15    METs 1.1      T5 Nustep   Level 1    SPM 80    Minutes 15    METs 1.1      Prescription Details   Frequency (times per week) 2    Duration Progress to 30 minutes of continuous aerobic without signs/symptoms of physical distress      Intensity   THRR 40-80% of Max Heartrate 109-134    Ratings of Perceived Exertion 11-13    Perceived Dyspnea 0-4      Progression   Progression Continue to progress workloads to maintain intensity without signs/symptoms of physical distress.      Resistance Training   Training Prescription Yes    Weight 3 lb    Reps 10-15             Perform Capillary Blood Glucose checks as needed.  Exercise Prescription Changes:   Exercise Prescription Changes     Row Name 04/05/21 0900 04/26/21 1000 05/08/21 0900 05/23/21 1100       Response to Exercise   Blood Pressure (Admit) 132/72 118/60 110/70 124/76    Blood Pressure (Exercise) 162/70 128/72 148/66 128/70    Blood Pressure (Exit) 140/70 106/62 132/58 136/62    Heart Rate (Admit) 84 bpm 56 bpm 66 bpm 66 bpm    Heart Rate (Exercise) 104 bpm 81 bpm 101 bpm 76 bpm    Heart Rate (Exit) 88 bpm 69 bpm 92 bpm 63 bpm    Oxygen Saturation (Admit) 94 % 91 % 90 % 89 %    Oxygen Saturation (Exercise) 80 % 83 % 82 % 88 %  Oxygen Saturation (Exit) 94 % 91 % 91 % 90 %    Rating of Perceived Exertion (Exercise) 15 15 17 13     Perceived Dyspnea (Exercise) 2 3 2 3     Symptoms SOB SOB SOB SOB    Comments walk test results first full day of exercise -- --    Duration -- Progress to 30 minutes of  aerobic without signs/symptoms of physical distress Progress to 30 minutes of  aerobic without signs/symptoms of physical distress  Progress to 30 minutes of  aerobic without signs/symptoms of physical distress    Intensity -- THRR unchanged THRR unchanged THRR unchanged         Progression   Progression -- Continue to progress workloads to maintain intensity without signs/symptoms of physical distress. Continue to progress workloads to maintain intensity without signs/symptoms of physical distress. Continue to progress workloads to maintain intensity without signs/symptoms of physical distress.    Average METs -- 2.17 2.07 2.9         Resistance Training   Training Prescription -- Yes Yes Yes    Weight -- 3 lb 3 lb 3 lb    Reps -- 10-15 10-15 10-15         Interval Training   Interval Training -- No No No         Treadmill   MPH -- 0.9 1.2 0.8    Grade -- 0 0 0    Minutes -- 15 15 15     METs -- 1 1.92 2.3         Recumbant Bike   Level -- 1 -- --    Minutes -- 15 -- --    METs -- 2.84 -- --         NuStep   Level -- -- 2 --    Minutes -- -- 15 --    METs -- -- 2.7 --         REL-XR   Level -- -- 1 1    Minutes -- -- 15 15    METs -- -- -- 3.5         T5 Nustep   Level -- 1 1 --    Minutes -- 15 15 --    METs -- 2 1.9 --         Biostep-RELP   Level -- -- 1 --    Minutes -- -- 15 --    METs -- -- 2 --         Track   Laps -- 15 15 --    Minutes -- 15 15 --    METs -- 1.82 1.82 --             Exercise Comments:   Exercise Comments     Row Name 04/12/21 1157           Exercise Comments First full day of exercise!  Patient was oriented to gym and equipment including functions, settings, policies, and procedures.  Patient's individual exercise prescription and treatment plan were reviewed.  All starting workloads were established based on the results of the 6 minute walk test done at initial orientation visit.  The plan for exercise progression was also introduced and progression will be customized based on patient's performance and goals.                Exercise  Goals and Review:   Exercise Goals     Row Name 04/05/21 1000  Exercise Goals   Increase Physical Activity Yes       Intervention Provide advice, education, support and counseling about physical activity/exercise needs.;Develop an individualized exercise prescription for aerobic and resistive training based on initial evaluation findings, risk stratification, comorbidities and participant's personal goals.       Expected Outcomes Short Term: Attend rehab on a regular basis to increase amount of physical activity.;Long Term: Add in home exercise to make exercise part of routine and to increase amount of physical activity.;Long Term: Exercising regularly at least 3-5 days a week.       Increase Strength and Stamina Yes       Intervention Provide advice, education, support and counseling about physical activity/exercise needs.;Develop an individualized exercise prescription for aerobic and resistive training based on initial evaluation findings, risk stratification, comorbidities and participant's personal goals.       Expected Outcomes Short Term: Increase workloads from initial exercise prescription for resistance, speed, and METs.;Short Term: Perform resistance training exercises routinely during rehab and add in resistance training at home;Long Term: Improve cardiorespiratory fitness, muscular endurance and strength as measured by increased METs and functional capacity (6MWT)       Able to understand and use rate of perceived exertion (RPE) scale Yes       Intervention Provide education and explanation on how to use RPE scale       Expected Outcomes Short Term: Able to use RPE daily in rehab to express subjective intensity level;Long Term:  Able to use RPE to guide intensity level when exercising independently       Able to understand and use Dyspnea scale Yes       Intervention Provide education and explanation on how to use Dyspnea scale       Expected Outcomes Short Term: Able to  use Dyspnea scale daily in rehab to express subjective sense of shortness of breath during exertion;Long Term: Able to use Dyspnea scale to guide intensity level when exercising independently       Knowledge and understanding of Target Heart Rate Range (THRR) Yes       Intervention Provide education and explanation of THRR including how the numbers were predicted and where they are located for reference       Expected Outcomes Short Term: Able to state/look up THRR;Short Term: Able to use daily as guideline for intensity in rehab;Long Term: Able to use THRR to govern intensity when exercising independently       Able to check pulse independently Yes       Intervention Provide education and demonstration on how to check pulse in carotid and radial arteries.;Review the importance of being able to check your own pulse for safety during independent exercise       Expected Outcomes Short Term: Able to explain why pulse checking is important during independent exercise;Long Term: Able to check pulse independently and accurately       Understanding of Exercise Prescription Yes       Intervention Provide education, explanation, and written materials on patient's individual exercise prescription       Expected Outcomes Short Term: Able to explain program exercise prescription;Long Term: Able to explain home exercise prescription to exercise independently                Exercise Goals Re-Evaluation :  Exercise Goals Re-Evaluation     Row Name 04/12/21 1157 04/26/21 1033 05/08/21 0924 05/23/21 1158 06/04/21 1043     Exercise Goal Re-Evaluation   Exercise  Goals Review Able to understand and use rate of perceived exertion (RPE) scale;Knowledge and understanding of Target Heart Rate Range (THRR);Able to understand and use Dyspnea scale;Understanding of Exercise Prescription Increase Physical Activity;Understanding of Exercise Prescription;Increase Strength and Stamina Increase Physical  Activity;Understanding of Exercise Prescription;Increase Strength and Stamina Increase Physical Activity;Increase Strength and Stamina Increase Physical Activity;Increase Strength and Stamina   Comments Reviewed RPE and dyspnea scales, THR and program prescription with pt today.  Pt voiced understanding and was given a copy of goals to take home. Rush Landmark is doing well the first couple sessions he has been here for rehab. He recently got clearance to maintain O2 sats > 83% per MD as patient wants to progress without oxygen, per doctor notes. Patient has already tolerated 15 laps on the track- he is slowly working up his speed on the treadmill while maintain good oxygen saturation. Will continue to monitor for progression. Rush Landmark continues to do well in rehab.   He is doing well at monitoring his oxygen levels closely and will take rest breaks when they drop too low to allow them to recover.  We will continue to montior his progress. Rush Landmark continues to work on endurance on TM and being able to walk longer before stopping due to oxygen.  We will continue to monitor progress. Rush Landmark has not been to rehab recently as he was in the hospital for problems with his oxygen. He recently saw Dr. Sabra Heck who has prescribed oxygen for patient and patient is due back at rehab tomorrow. We will continue to monitor his progression and hope to see improvement with his new O2 prescription and hopefully higher oxygen saturations.   Expected Outcomes Short: Use RPE daily to regulate intensity. Long: Follow program prescription in THR. Short: Increase speed on treadmill as tolerated Long: Continue to build up strength and stamina -- Short: continue to work on walking longer Long:  walk with oxygen staying 88% or above Short: Adjust to exercise with O2 prescription Long: Exercise independently with appropriate O2 levels            Discharge Exercise Prescription (Final Exercise Prescription Changes):  Exercise Prescription Changes -  05/23/21 1100       Response to Exercise   Blood Pressure (Admit) 124/76    Blood Pressure (Exercise) 128/70    Blood Pressure (Exit) 136/62    Heart Rate (Admit) 66 bpm    Heart Rate (Exercise) 76 bpm    Heart Rate (Exit) 63 bpm    Oxygen Saturation (Admit) 89 %    Oxygen Saturation (Exercise) 88 %    Oxygen Saturation (Exit) 90 %    Rating of Perceived Exertion (Exercise) 13    Perceived Dyspnea (Exercise) 3    Symptoms SOB    Duration Progress to 30 minutes of  aerobic without signs/symptoms of physical distress    Intensity THRR unchanged      Progression   Progression Continue to progress workloads to maintain intensity without signs/symptoms of physical distress.    Average METs 2.9      Resistance Training   Training Prescription Yes    Weight 3 lb    Reps 10-15      Interval Training   Interval Training No      Treadmill   MPH 0.8    Grade 0    Minutes 15    METs 2.3      REL-XR   Level 1    Minutes 15    METs 3.5  Nutrition:  Target Goals: Understanding of nutrition guidelines, daily intake of sodium '1500mg'$ , cholesterol '200mg'$ , calories 30% from fat and 7% or less from saturated fats, daily to have 5 or more servings of fruits and vegetables.  Education: All About Nutrition: -Group instruction provided by verbal, written material, interactive activities, discussions, models, and posters to present general guidelines for heart healthy nutrition including fat, fiber, MyPlate, the role of sodium in heart healthy nutrition, utilization of the nutrition label, and utilization of this knowledge for meal planning. Follow up email sent as well. Written material given at graduation. Flowsheet Row Pulmonary Rehab from 05/10/2021 in [facility] Cardiac and Pulmonary Rehab  Date 05/01/21  Educator [facility]  Instruction Review Code 1- Verbalizes Understanding       Biometrics:  Pre Biometrics - 04/05/21 0944       Pre Biometrics   Height 5' 6.75" (1.695 m)     Weight 208 lb (94.3 kg)    BMI (Calculated) 32.84    Single Leg Stand 1.64 seconds              Nutrition Therapy Plan and Nutrition Goals:  Nutrition Therapy & Goals - 05/15/21 0914       Nutrition Therapy   Diet Heart healthy, low Na, pulmonary MNT    Drug/Food Interactions Statins/Certain Fruits    Protein (specify units) 110g    Fiber 30 grams    Whole Grain Foods 3 servings    Saturated Fats 12 max. grams    Fruits and Vegetables 8 servings/day    Sodium 1.5 grams      Personal Nutrition Goals   Nutrition Goal ST: add protein to breakfast (nuts/seeds, boiled egg) LT: maintain changes made after heart event, meet protein and calorie needs    Comments He is trying to eat more fruit and vegetables. B: cereal (high in fiber) - 2 % milk L: banana or apple or yogurt - greek (with nuts and fruit) D: poultry, roast beef, vegetables (vegetable oil or chicken broth (low Na)) - does not add salt  S: ice cream sometimes. Drinks: water or fruit drink or diet sprite (trying to cut these out). Patient reports making many changes post heart event. Discussed heart healthy eating and pulmonary MNT.      Intervention Plan   Intervention Prescribe, educate and counsel regarding individualized specific dietary modifications aiming towards targeted core components such as weight, hypertension, lipid management, diabetes, heart failure and other comorbidities.;Nutrition handout(s) given to patient.    Expected Outcomes Short Term Goal: Understand basic principles of dietary content, such as calories, fat, sodium, cholesterol and nutrients.;Short Term Goal: A plan has been developed with personal nutrition goals set during dietitian appointment.;Long Term Goal: Adherence to prescribed nutrition plan.             Nutrition Assessments:  MEDIFICTS Score Key: ?70 Need to make dietary changes  40-70 Heart Healthy Diet ? 40 Therapeutic Level Cholesterol Diet  Flowsheet Row Pulmonary Rehab  from 04/05/2021 in [facility] Cardiac and Pulmonary Rehab  Picture Your Plate Total Score on Admission 58      Picture Your Plate Scores: [id number] Unhealthy dietary pattern with much room for improvement. 41-50 Dietary pattern unlikely to meet recommendations for good health and room for improvement. 51-60 More healthful dietary pattern, with some room for improvement.  >60 Healthy dietary pattern, although there may be some specific behaviors that could be improved.   Nutrition Goals Re-Evaluation:  Nutrition Goals Re-Evaluation     Row Name  05/10/21 1003             Goals   Comment Bill deferred RD appointment last month but has scheduled time for next week.                Nutrition Goals Discharge (Final Nutrition Goals Re-Evaluation):  Nutrition Goals Re-Evaluation - 05/10/21 1003       Goals   Comment Bill deferred RD appointment last month but has scheduled time for next week.             Psychosocial: Target Goals: Acknowledge presence or absence of significant depression and/or stress, maximize coping skills, provide positive support system. Participant is able to verbalize types and ability to use techniques and skills needed for reducing stress and depression.   Education: Stress, Anxiety, and Depression - Group verbal and visual presentation to define topics covered.  Reviews how body is impacted by stress, anxiety, and depression.  Also discusses healthy ways to reduce stress and to treat/manage anxiety and depression.  Written material given at graduation.   Education: Sleep Hygiene -Provides group verbal and written instruction about how sleep can affect your health.  Define sleep hygiene, discuss sleep cycles and impact of sleep habits. Review good sleep hygiene tips.    Initial Review & Psychosocial Screening:  Initial Psych Review & Screening - 03/26/21 1404       Initial Review   Current issues with Current Psychotropic Meds;Current Anxiety/Panic       Family Dynamics   Good Support System? Yes    Comments His anxiety stems from COVID and his shortness of breath. He can look to his wife and nephew for support.      Barriers   Psychosocial barriers to participate in program The patient should benefit from training in stress management and relaxation.      Screening Interventions   Interventions Encouraged to exercise;To provide support and resources with identified psychosocial needs;Provide feedback about the scores to participant    Expected Outcomes Short Term goal: Utilizing psychosocial counselor, staff and physician to assist with identification of specific Stressors or current issues interfering with healing process. Setting desired goal for each stressor or current issue identified.;Long Term Goal: Stressors or current issues are controlled or eliminated.;Short Term goal: Identification and review with participant of any Quality of Life or Depression concerns found by scoring the questionnaire.;Long Term goal: The participant improves quality of Life and PHQ9 Scores as seen by post scores and/or verbalization of changes             Quality of Life Scores:  Scores of 19 and below usually indicate a poorer quality of life in these areas.  A difference of  2-3 points is a clinically meaningful difference.  A difference of 2-3 points in the total score of the Quality of Life Index has been associated with significant improvement in overall quality of life, self-image, physical symptoms, and general health in studies assessing change in quality of life.  PHQ-9: Recent Review Flowsheet Data     Depression screen [facility] 2/9 04/05/2021   Decreased Interest 0   Down, Depressed, Hopeless 0   PHQ - 2 Score 0   Altered sleeping 1   Tired, decreased energy 2   Change in appetite 1   Feeling bad or failure about yourself  0   Trouble concentrating 0   Moving slowly or fidgety/restless 0   Suicidal thoughts 0   PHQ-9 Score 4   Difficult  doing  work/chores Not difficult at all      Interpretation of Total Score  Total Score Depression Severity:  1-4 = Minimal depression, 5-9 = Mild depression, 10-14 = Moderate depression, 15-19 = Moderately severe depression, 20-27 = Severe depression   Psychosocial Evaluation and Intervention:   Psychosocial Re-Evaluation:  Psychosocial Re-Evaluation     [employer] Name 05/10/21 1001             Psychosocial Re-Evaluation   Comments Bill reports no stress anxiety or depression.  He sleeps 7-8 hours and sleeps well.       Expected Outcomes Short: notify staff of any changes in symptoms Long:  maintain positive outlook                Psychosocial Discharge (Final Psychosocial Re-Evaluation):  Psychosocial Re-Evaluation - 05/10/21 1001       Psychosocial Re-Evaluation   Comments Bill reports no stress anxiety or depression.  He sleeps 7-8 hours and sleeps well.    Expected Outcomes Short: notify staff of any changes in symptoms Long:  maintain positive outlook             Education: Education Goals: Education classes will be provided on a weekly basis, covering required topics. Participant will state understanding/return demonstration of topics presented.  Learning Barriers/Preferences:  Learning Barriers/Preferences - 03/26/21 1403       Learning Barriers/Preferences   Learning Barriers None    Learning Preferences None             General Pulmonary Education Topics:  Infection Prevention: - Provides verbal and written material to individual with discussion of infection control including proper hand washing and proper equipment cleaning during exercise session. Flowsheet Row Pulmonary Rehab from 05/10/2021 in [facility] Cardiac and Pulmonary Rehab  Date 03/26/21  Educator [facility]  Instruction Review Code 1- Verbalizes Understanding       Falls Prevention: - Provides verbal and written material to individual with discussion of falls prevention and safety. Flowsheet  Row Pulmonary Rehab from 05/10/2021 in [facility] Cardiac and Pulmonary Rehab  Date 03/26/21  Educator [facility]  Instruction Review Code 1- Verbalizes Understanding       Chronic Lung Disease Review: - Group verbal instruction with posters, models, PowerPoint presentations and videos,  to review new updates, new respiratory medications, new advancements in procedures and treatments. Providing information on websites and "800" numbers for continued self-education. Includes information about supplement oxygen, available portable oxygen systems, continuous and intermittent flow rates, oxygen safety, concentrators, and Medicare reimbursement for oxygen. Explanation of Pulmonary Drugs, including class, frequency, complications, importance of spacers, rinsing mouth after steroid MDI's, and proper cleaning methods for nebulizers. Review of basic lung anatomy and physiology related to function, structure, and complications of lung disease. Review of risk factors. Discussion about methods for diagnosing sleep apnea and types of masks and machines for OSA. Includes a review of the use of types of environmental controls: home humidity, furnaces, filters, dust mite/pet prevention, HEPA vacuums. Discussion about weather changes, air quality and the benefits of nasal washing. Instruction on Warning signs, infection symptoms, calling MD promptly, preventive modes, and value of vaccinations. Review of effective airway clearance, coughing and/or vibration techniques. Emphasizing that all should Create an Action Plan. Written material given at graduation. Flowsheet Row Pulmonary Rehab from 05/10/2021 in [facility] Cardiac and Pulmonary Rehab  Date 05/10/21  Educator [facility]  Instruction Review Code 1- Verbalizes Understanding       AED/CPR: - Group verbal and written instruction with the use of  models to demonstrate the basic use of the AED with the basic ABC's of resuscitation.    Anatomy and Cardiac Procedures: - Group verbal and  visual presentation and models provide information about basic cardiac anatomy and function. Reviews the testing methods done to diagnose heart disease and the outcomes of the test results. Describes the treatment choices: Medical Management, Angioplasty, or Coronary Bypass Surgery for treating various heart conditions including Myocardial Infarction, Angina, Valve Disease, and Cardiac Arrhythmias.  Written material given at graduation.   Medication Safety: - Group verbal and visual instruction to review commonly prescribed medications for heart and lung disease. Reviews the medication, class of the drug, and side effects. Includes the steps to properly store meds and maintain the prescription regimen.  Written material given at graduation. Flowsheet Row Pulmonary Rehab from 05/10/2021 in [facility] Cardiac and Pulmonary Rehab  Date 04/26/21  Educator SB  Instruction Review Code 1- Verbalizes Understanding       Other: -Provides group and verbal instruction on various topics (see comments)   Knowledge Questionnaire Score:  Knowledge Questionnaire Score - 04/05/21 0947       Knowledge Questionnaire Score   Pre Score 16/18: Oxygen sats, Oxygen usage              Core Components/Risk Factors/Patient Goals at Admission:  Personal Goals and Risk Factors at Admission - 04/05/21 1000       Core Components/Risk Factors/Patient Goals on Admission    Weight Management Yes;Weight Loss    Intervention Weight Management: Develop a combined nutrition and exercise program designed to reach desired caloric intake, while maintaining appropriate intake of nutrient and fiber, sodium and fats, and appropriate energy expenditure required for the weight goal.;Weight Management: Provide education and appropriate resources to help participant work on and attain dietary goals.;Weight Management/Obesity: Establish reasonable short term and long term weight goals.    Admit Weight 208 lb (94.3 kg)    Goal Weight:  Short Term 203 lb (92.1 kg)    Goal Weight: Long Term 195 lb (88.5 kg)    Expected Outcomes Short Term: Continue to assess and modify interventions until short term weight is achieved;Weight Loss: Understanding of general recommendations for a balanced deficit meal plan, which promotes 1-2 lb weight loss per week and includes a negative energy balance of [phone] kcal/d;Long Term: Adherence to nutrition and physical activity/exercise program aimed toward attainment of established weight goal;Understanding recommendations for meals to include 15-35% energy as protein, 25-35% energy from fat, 35-60% energy from carbohydrates, less than '200mg'$  of dietary cholesterol, 20-35 gm of total fiber daily;Understanding of distribution of calorie intake throughout the day with the consumption of 4-5 meals/snacks    Improve shortness of breath with ADL's Yes    Intervention Provide education, individualized exercise plan and daily activity instruction to help decrease symptoms of SOB with activities of daily living.    Expected Outcomes Short Term: Improve cardiorespiratory fitness to achieve a reduction of symptoms when performing ADLs;Long Term: Be able to perform more ADLs without symptoms or delay the onset of symptoms    Hypertension Yes    Intervention Provide education on lifestyle modifcations including regular physical activity/exercise, weight management, moderate sodium restriction and increased consumption of fresh fruit, vegetables, and low fat dairy, alcohol moderation, and smoking cessation.;Monitor prescription use compliance.    Expected Outcomes Short Term: Continued assessment and intervention until BP is < 140/25m HG in hypertensive participants. < 130/846mHG in hypertensive participants with diabetes, heart failure or chronic kidney disease.;Long  Term: Maintenance of blood pressure at goal levels.    Lipids Yes    Intervention Provide education and support for participant on nutrition &  aerobic/resistive exercise along with prescribed medications to achieve LDL '70mg'$ , HDL >'40mg'$ .    Expected Outcomes Short Term: Participant states understanding of desired cholesterol values and is compliant with medications prescribed. Participant is following exercise prescription and nutrition guidelines.;Long Term: Cholesterol controlled with medications as prescribed, with individualized exercise RX and with personalized nutrition plan. Value goals: LDL < '70mg'$ , HDL > 40 mg.             Education:Diabetes - Individual verbal and written instruction to review signs/symptoms of diabetes, desired ranges of glucose level fasting, after meals and with exercise. Acknowledge that pre and post exercise glucose checks will be done for 3 sessions at entry of program.   Know Your Numbers and Heart Failure: - Group verbal and visual instruction to discuss disease risk factors for cardiac and pulmonary disease and treatment options.  Reviews associated critical values for Overweight/Obesity, Hypertension, Cholesterol, and Diabetes.  Discusses basics of heart failure: signs/symptoms and treatments.  Introduces Heart Failure Zone chart for action plan for heart failure.  Written material given at graduation. Flowsheet Row Pulmonary Rehab from 05/10/2021 in [facility] Cardiac and Pulmonary Rehab  Date 05/03/21  Educator SB  Instruction Review Code 1- Verbalizes Understanding       Core Components/Risk Factors/Patient Goals Review:   Goals and Risk Factor Review     Row Name 05/10/21 0957             Core Components/Risk Factors/Patient Goals Review   Personal Goals Review Weight Management/Obesity;Improve shortness of breath with ADL's;Hypertension       Review Rush [place]mark feels he is making progress.  He is able to do about 10 min total now.  He is able to do most ADLs without trouble except walking to his car.  He is in his third week of sessions.  He reports taking all medications as directed.  He does  monitor oxygen at home.  He doesnt monitor BP at home.  His weight is down some today. He can tell his pants fit looser.       Expected Outcomes Short:  continue to check oxygen at home and work on walking Long:  manage risk factors                Core Components/Risk Factors/Patient Goals at Discharge (Final Review):   Goals and Risk Factor Review - 05/10/21 0957       Core Components/Risk Factors/Patient Goals Review   Personal Goals Review Weight Management/Obesity;Improve shortness of breath with ADL's;Hypertension    Review Rush Landmark feels he is making progress.  He is able to do about 10 min total now.  He is able to do most ADLs without trouble except walking to his car.  He is in his third week of sessions.  He reports taking all medications as directed.  He does monitor oxygen at home.  He doesnt monitor BP at home.  His weight is down some today. He can tell his pants fit looser.    Expected Outcomes Short:  continue to check oxygen at home and work on walking Long:  manage risk factors             ITP Comments:  ITP Comments     Row Name 03/26/21 1402 04/05/21 0940 04/12/21 1157 04/18/21 1203 04/19/21 1054   ITP Comments Virtual Visit completed. Patient  informed on EP and RD appointment and 6 Minute walk test. Patient also informed of patient health questionnaires on My Chart. Patient Verbalizes understanding. Visit diagnosis can be found in [facility] 02/28/2021. Completed 6MWT and gym orientation. Initial ITP created and sent for review to Dr. Ottie Glazier, Medical Director. First full day of exercise!  Patient was oriented to gym and equipment including functions, settings, policies, and procedures.  Patient's individual exercise prescription and treatment plan were reviewed.  All starting workloads were established based on the results of the 6 minute walk test done at initial orientation visit.  The plan for exercise progression was also introduced and progression will be customized  based on patient's performance and goals. 30 Day review completed. Medical Director ITP review done, changes made as directed, and signed approval by Medical Director. Patient oxygen saturations dropped routinely throughout rehab. Patient does not want to use oxygen, per Dr. Sabra Heck, "He may exercise without O2- stop as he needs during exercise, only stop exercise if pulse ox drops less than 83%." Staff has doctor's orders note with chart for documentation.    [place] Name 05/15/21 0902 05/16/21 0829 06/06/21 1401 06/13/21 0802     ITP Comments Completed initial RD consultation 30 Day review completed. Medical Director ITP review done, changes made as directed, and signed approval by Medical Director. --  Spoke with Bills wife - he saw lung Dr today and they want him to wait until hes a little stronger to return.  Staff will take him off schedule for 2 weeks and they will call when he can return. 30 Day review completed. Medical Director ITP review done, changes made as directed, and signed approval by Medical Director.             Comments:

## 2021-06-19 NOTE — Progress Notes (Unsigned)
Bills wife called and siad he is waiting until he sees Dr Sabra Heck next month to come back to [place].  He is on medical hold until we recieve clearance.

## 2021-06-22 ENCOUNTER — Other Ambulatory Visit: Payer: Self-pay

## 2021-06-22 ENCOUNTER — Ambulatory Visit
Admission: RE | Admit: 2021-06-22 | Discharge: 2021-06-22 | Disposition: A | Discharge status: Home / Self Care | Payer: Medicare HMO | Source: Ambulatory Visit | Attending: Pulmonary Disease | Admitting: Pulmonary Disease

## 2021-06-22 ENCOUNTER — Ambulatory Visit
Admission: RE | Admit: 2021-06-22 | Discharge: 2021-06-22 | Disposition: A | Discharge status: Home / Self Care | Payer: Medicare HMO | Source: Ambulatory Visit | Attending: Internal Medicine | Admitting: Internal Medicine

## 2021-06-22 DIAGNOSIS — K8020 Calculus of gallbladder without cholecystitis without obstruction: Secondary | ICD-10-CM | POA: Diagnosis not present

## 2021-06-22 DIAGNOSIS — R0902 Hypoxemia: Secondary | ICD-10-CM | POA: Diagnosis not present

## 2021-06-22 DIAGNOSIS — R911 Solitary pulmonary nodule: Secondary | ICD-10-CM | POA: Diagnosis not present

## 2021-06-22 DIAGNOSIS — J9611 Chronic respiratory failure with hypoxia: Secondary | ICD-10-CM | POA: Insufficient documentation

## 2021-06-22 DIAGNOSIS — J9601 Acute respiratory failure with hypoxia: Secondary | ICD-10-CM | POA: Diagnosis not present

## 2021-06-22 DIAGNOSIS — R0602 Shortness of breath: Secondary | ICD-10-CM | POA: Diagnosis not present

## 2021-06-22 DIAGNOSIS — J9690 Respiratory failure, unspecified, unspecified whether with hypoxia or hypercapnia: Secondary | ICD-10-CM | POA: Diagnosis not present

## 2021-06-22 MED ORDER — IOHEXOL 350 MG/ML SOLN
75.0000 mL | Freq: Once | INTRAVENOUS | Status: AC | PRN
  Administered 2021-06-22: 70 mL via INTRAVENOUS

## 2021-06-25 ENCOUNTER — Other Ambulatory Visit: Payer: Self-pay | Admitting: Internal Medicine

## 2021-06-25 ENCOUNTER — Other Ambulatory Visit: Payer: Self-pay | Admitting: Sports Medicine

## 2021-06-25 DIAGNOSIS — R911 Solitary pulmonary nodule: Secondary | ICD-10-CM

## 2021-06-25 DIAGNOSIS — J449 Chronic obstructive pulmonary disease, unspecified: Secondary | ICD-10-CM | POA: Diagnosis not present

## 2021-06-25 DIAGNOSIS — G4733 Obstructive sleep apnea (adult) (pediatric): Secondary | ICD-10-CM | POA: Diagnosis not present

## 2021-06-29 DIAGNOSIS — H401131 Primary open-angle glaucoma, bilateral, mild stage: Secondary | ICD-10-CM | POA: Diagnosis not present

## 2021-06-29 DIAGNOSIS — F209 Schizophrenia, unspecified: Secondary | ICD-10-CM | POA: Diagnosis not present

## 2021-07-07 DIAGNOSIS — J449 Chronic obstructive pulmonary disease, unspecified: Secondary | ICD-10-CM | POA: Diagnosis not present

## 2021-07-07 DIAGNOSIS — G4733 Obstructive sleep apnea (adult) (pediatric): Secondary | ICD-10-CM | POA: Diagnosis not present

## 2021-07-09 DIAGNOSIS — J432 Centrilobular emphysema: Secondary | ICD-10-CM | POA: Diagnosis not present

## 2021-07-09 DIAGNOSIS — Z01818 Encounter for other preprocedural examination: Secondary | ICD-10-CM | POA: Diagnosis not present

## 2021-07-11 ENCOUNTER — Encounter: Payer: Self-pay | Admitting: *Deleted

## 2021-07-11 DIAGNOSIS — J449 Chronic obstructive pulmonary disease, unspecified: Secondary | ICD-10-CM

## 2021-07-11 NOTE — Progress Notes (Signed)
Pulmonary Individual Treatment Plan  Patient Details  Name: Bradley Bowers MRN: [id number] Date of Birth: 02-Apr-1946 Referring Provider:   April Manson Pulmonary Rehab from 04/05/2021 in [facility] Cardiac and Pulmonary Rehab  Referring Provider Emily Filbert MD       Initial Encounter Date:  Flowsheet Row Pulmonary Rehab from 04/05/2021 in [facility] Cardiac and Pulmonary Rehab  Date 04/05/21       Visit Diagnosis: Chronic obstructive pulmonary disease, unspecified COPD type ([place])  Patient's Home Medications on Admission:  Current Outpatient Medications:    ADVAIR DISKUS 100-50 MCG/ACT AEPB, Inhale 1 puff into the lungs 2 (two) times daily., Disp: , Rfl:    albuterol (VENTOLIN HFA) 108 (90 Base) MCG/ACT inhaler, Inhale 1-2 puffs into the lungs every 4 (four) hours as needed., Disp: , Rfl:    aspirin 81 MG EC tablet, Take 81 mg by mouth daily., Disp: , Rfl: 11   atenolol (TENORMIN) 50 MG tablet, Take 50 mg by mouth daily., Disp: , Rfl:    atorvastatin (LIPITOR) 80 MG tablet, Take 40 mg by mouth daily., Disp: , Rfl:    carbamazepine (TEGRETOL) 200 MG tablet, Take 1 tablet by mouth 2 (two) times daily., Disp: , Rfl:    divalproex (DEPAKOTE ER) 500 MG 24 hr tablet, Take 3 tablets (1,500 mg total) by mouth at bedtime., Disp: 90 tablet, Rfl: 1   donepezil (ARICEPT) 5 MG tablet, Take 5 mg by mouth at bedtime., Disp: , Rfl:    fluticasone (FLONASE) 50 MCG/ACT nasal spray, Place 1 spray into both nostrils daily., Disp: , Rfl:    fluticasone furoate-vilanterol (BREO ELLIPTA) 100-25 MCG/INH AEPB, Inhale 1 puff into the lungs daily., Disp: , Rfl:    furosemide (LASIX) 20 MG tablet, Take 1 tablet (20 mg total) by mouth daily., Disp: 60 tablet, Rfl: 11   gabapentin (NEURONTIN) 300 MG capsule, Take 300 mg by mouth at bedtime., Disp: , Rfl:    lisinopril (ZESTRIL) 5 MG tablet, Take 1 tablet by mouth daily., Disp: , Rfl:    montelukast (SINGULAIR) 10 MG tablet, Take 10 mg by mouth at bedtime., Disp: , Rfl:     OLANZapine (ZYPREXA) 5 MG tablet, Take 5 mg by mouth at bedtime., Disp: , Rfl:    pantoprazole (PROTONIX) 40 MG tablet, Take 40 mg by mouth 2 (two) times daily., Disp: , Rfl:    predniSONE (DELTASONE) 20 MG tablet, Take 2 tablets (40 mg total) by mouth daily with breakfast., Disp: 2 tablet, Rfl: 0   tiotropium (SPIRIVA) 18 MCG inhalation capsule, Place 18 mcg into inhaler and inhale daily., Disp: , Rfl:    vitamin B-12 1000 MCG tablet, Take 1 tablet (1,000 mcg total) by mouth daily., Disp: 30 tablet, Rfl: 1  Past Medical History: Past Medical History:  Diagnosis Date   Arterial vascular disease 10/24/2014   Overview:  MI 1994, small-vessel disease requiring coumadin    Asthma    Centrilobular emphysema ([place]) 10/24/2014   Overview:  2L O2 prn    Centrilobular emphysema ([facility])    Chronic obstructive pulmonary disease ([place]) 11/06/2015   Coronary artery disease    GERD (gastroesophageal reflux disease)    Glaucoma    Heart attack ([place]) 2/17, 1994   Heart disease    Hyperlipemia    Hypertension    Restless leg 10/24/2014   Sleep apnea     Tobacco Use: Social History   Tobacco Use  Smoking Status Former   Packs/day: 1.00   Years: 30.00  Pack years: 30.00   Types: Cigarettes   Quit date: 03/07/1994   Years since quitting: 27.3  Smokeless Tobacco Never    Labs: Recent Review Flowsheet Data     Labs for ITP Cardiac and Pulmonary Rehab Latest Ref Rng & Units 09/24/2016   Cholestrol 0 - 200 mg/dL 177   LDLCALC 0 - 99 mg/dL 111(H)   HDL >40 mg/dL 41   Trlycerides <150 mg/dL 126   Hemoglobin A1c 4.8 - 5.6 % 5.2        Pulmonary Assessment Scores:  Pulmonary Assessment Scores     Row Name 04/05/21 0945         ADL UCSD   ADL Phase Entry     SOB Score total 19     Rest 0     Walk 2     Stairs 3     Bath 0     Dress 1     Shop 0           CAT Score   CAT Score 16           mMRC Score   mMRC Score 2              UCSD: Self-administered rating of  dyspnea associated with activities of daily living (ADLs) 6-point scale (0 = "not at all" to 5 = "maximal or unable to do because of breathlessness")  Scoring Scores range from 0 to 120.  Minimally important difference is 5 units  CAT: CAT can identify the health impairment of COPD patients and is better correlated with disease progression.  CAT has a scoring range of zero to 40. The CAT score is classified into four groups of low (less than 10), medium (10 - 20), high (21-30) and very high (31-40) based on the impact level of disease on health status. A CAT score over 10 suggests significant symptoms.  A worsening CAT score could be explained by an exacerbation, poor medication adherence, poor inhaler technique, or progression of COPD or comorbid conditions.  CAT MCID is 2 points  mMRC: mMRC (Modified Medical Research Council) Dyspnea Scale is used to assess the degree of baseline functional disability in patients of respiratory disease due to dyspnea. No minimal important difference is established. A decrease in score of 1 point or greater is considered a positive change.   Pulmonary Function Assessment:  Pulmonary Function Assessment - 03/26/21 1403       Breath   Shortness of Breath Yes;Limiting activity             Exercise Target Goals: Exercise Program Goal: Individual exercise prescription set using results from initial 6 min walk test and THRR while considering  patient's activity barriers and safety.   Exercise Prescription Goal: Initial exercise prescription builds to 30-45 minutes a day of aerobic activity, 2-3 days per week.  Home exercise guidelines will be given to patient during program as part of exercise prescription that the participant will acknowledge.  Education: Aerobic Exercise: - Group verbal and visual presentation on the components of exercise prescription. Introduces F.I.T.T principle from ACSM for exercise prescriptions.  Reviews F.I.T.T. principles of  aerobic exercise including progression. Written material given at graduation.   Education: Resistance Exercise: - Group verbal and visual presentation on the components of exercise prescription. Introduces F.I.T.T principle from ACSM for exercise prescriptions  Reviews F.I.T.T. principles of resistance exercise including progression. Written material given at graduation.    Education: Exercise & Equipment Safety: - Individual  verbal instruction and demonstration of equipment use and safety with use of the equipment. Flowsheet Row Pulmonary Rehab from 05/10/2021 in [facility] Cardiac and Pulmonary Rehab  Date 03/26/21  Educator [facility]  Instruction Review Code 1- Verbalizes Understanding       Education: Exercise Physiology & General Exercise Guidelines: - Group verbal and written instruction with models to review the exercise physiology of the cardiovascular system and associated critical values. Provides general exercise guidelines with specific guidelines to those with heart or lung disease.    Education: Flexibility, Balance, Mind/Body Relaxation: - Group verbal and visual presentation with interactive activity on the components of exercise prescription. Introduces F.I.T.T principle from ACSM for exercise prescriptions. Reviews F.I.T.T. principles of flexibility and balance exercise training including progression. Also discusses the mind body connection.  Reviews various relaxation techniques to help reduce and manage stress (i.e. Deep breathing, progressive muscle relaxation, and visualization). Balance handout provided to take home. Written material given at graduation. Flowsheet Row Pulmonary Rehab from 05/10/2021 in [facility] Cardiac and Pulmonary Rehab  Date 04/12/21  Educator [facility]  Instruction Review Code 1- Verbalizes Understanding       Activity Barriers & Risk Stratification:  Activity Barriers & Cardiac Risk Stratification - 04/05/21 0944       Activity Barriers & Cardiac Risk  Stratification   Activity Barriers Shortness of Breath;Deconditioning;Muscular Weakness             6 Minute Walk:  6 Minute Walk     Row Name 04/05/21 0953         6 Minute Walk   Phase Initial     Distance 400 feet     Walk Time 2 minutes  Stopped early due to low O2 sats     # of Rest Breaks 0     MPH 2.27     METS 1.13     RPE 15     Perceived Dyspnea  2     VO2 Peak 3.96     Symptoms Yes (comment)     Comments SOB     Resting HR 84 bpm     Resting BP 132/72     Resting Oxygen Saturation  94 %     Exercise Oxygen Saturation  during 6 min walk 80 %  Stopped early     Max Ex. HR 104 bpm     Max Ex. BP 162/70     2 Minute Post BP 140/70           Interval HR   1 Minute HR 94     2 Minute HR 104     3 Minute HR --  Test stopped early     4 Minute HR --  Test stopped early     5 Minute HR --  Test stopped early     6 Minute HR --  Test stopped early     2 Minute Post HR 88     Interval Heart Rate? Yes           Interval Oxygen   Interval Oxygen? Yes     Baseline Oxygen Saturation % 94 %     1 Minute Oxygen Saturation % 87 %     1 Minute Liters of Oxygen 0 L  RA     2 Minute Oxygen Saturation % 80 %  Test stopped     2 Minute Liters of Oxygen 0 L     3 Minute Liters of Oxygen 0 L  4 Minute Liters of Oxygen 0 L     5 Minute Liters of Oxygen 0 L     6 Minute Liters of Oxygen 0 L     2 Minute Post Oxygen Saturation % 90 %     2 Minute Post Liters of Oxygen 0 L             Oxygen Initial Assessment:  Oxygen Initial Assessment - 04/05/21 0945       Home Oxygen   Home Oxygen Device None    Sleep Oxygen Prescription None    Home Exercise Oxygen Prescription None    Home Resting Oxygen Prescription None    Compliance with Home Oxygen Use Yes      Initial 6 min Walk   Oxygen Used None      Program Oxygen Prescription   Program Oxygen Prescription None      Intervention   Short Term Goals To learn and understand importance of monitoring SPO2  with pulse oximeter and demonstrate accurate use of the pulse oximeter.;To learn and exhibit compliance with exercise, home and travel O2 prescription;To learn and understand importance of maintaining oxygen saturations>88%;To learn and demonstrate proper pursed lip breathing techniques or other breathing techniques. ;To learn and demonstrate proper use of respiratory medications    Long  Term Goals Exhibits compliance with exercise, home  and travel O2 prescription;Maintenance of O2 saturations>88%;Verbalizes importance of monitoring SPO2 with pulse oximeter and return demonstration;Exhibits proper breathing techniques, such as pursed lip breathing or other method taught during program session;Compliance with respiratory medication;Demonstrates proper use of MDI's             Oxygen Re-Evaluation:  Oxygen Re-Evaluation     Row Name 04/19/21 1103             Program Oxygen Prescription   Program Oxygen Prescription None               Home Oxygen   Home Oxygen Device None       Sleep Oxygen Prescription None       Home Exercise Oxygen Prescription None  Patient approved to drop no less than 83% on RA, per Dr. Sabra Heck       Home Resting Oxygen Prescription None       Compliance with Home Oxygen Use Yes               Goals/Expected Outcomes   Short Term Goals To learn and understand importance of monitoring SPO2 with pulse oximeter and demonstrate accurate use of the pulse oximeter.;To learn and exhibit compliance with exercise, home and travel O2 prescription;To learn and understand importance of maintaining oxygen saturations>88%;To learn and demonstrate proper pursed lip breathing techniques or other breathing techniques. ;To learn and demonstrate proper use of respiratory medications       Long  Term Goals Exhibits compliance with exercise, home  and travel O2 prescription;Maintenance of O2 saturations>88%;Verbalizes importance of monitoring SPO2 with pulse oximeter and return  demonstration;Exhibits proper breathing techniques, such as pursed lip breathing or other method taught during program session;Compliance with respiratory medication;Demonstrates proper use of MDI's       Comments Per Dr. Sabra Heck, "He may exercise without O2- stop as he needs during exercise. Only stop exercise if pulse ox drops less than 83%." Note in chart for documentation.                Oxygen Discharge (Final Oxygen Re-Evaluation):  Oxygen Re-Evaluation - 04/19/21 1103  Program Oxygen Prescription   Program Oxygen Prescription None      Home Oxygen   Home Oxygen Device None    Sleep Oxygen Prescription None    Home Exercise Oxygen Prescription None   Patient approved to drop no less than 83% on RA, per Dr. Sabra Heck   Home Resting Oxygen Prescription None    Compliance with Home Oxygen Use Yes      Goals/Expected Outcomes   Short Term Goals To learn and understand importance of monitoring SPO2 with pulse oximeter and demonstrate accurate use of the pulse oximeter.;To learn and exhibit compliance with exercise, home and travel O2 prescription;To learn and understand importance of maintaining oxygen saturations>88%;To learn and demonstrate proper pursed lip breathing techniques or other breathing techniques. ;To learn and demonstrate proper use of respiratory medications    Long  Term Goals Exhibits compliance with exercise, home  and travel O2 prescription;Maintenance of O2 saturations>88%;Verbalizes importance of monitoring SPO2 with pulse oximeter and return demonstration;Exhibits proper breathing techniques, such as pursed lip breathing or other method taught during program session;Compliance with respiratory medication;Demonstrates proper use of MDI's    Comments Per Dr. Sabra Heck, "He may exercise without O2- stop as he needs during exercise. Only stop exercise if pulse ox drops less than 83%." Note in chart for documentation.             Initial Exercise Prescription:   Initial Exercise Prescription - 04/05/21 0900       Date of Initial Exercise RX and Referring Provider   Date 04/05/21    Referring Provider Emily Filbert MD      Treadmill   MPH 1.7    Grade 0    Minutes 15    METs 2.3      REL-XR   Level 1    Speed 50    Minutes 15    METs 1.1      T5 Nustep   Level 1    SPM 80    Minutes 15    METs 1.1      Prescription Details   Frequency (times per week) 2    Duration Progress to 30 minutes of continuous aerobic without signs/symptoms of physical distress      Intensity   THRR 40-80% of Max Heartrate 109-134    Ratings of Perceived Exertion 11-13    Perceived Dyspnea 0-4      Progression   Progression Continue to progress workloads to maintain intensity without signs/symptoms of physical distress.      Resistance Training   Training Prescription Yes    Weight 3 lb    Reps 10-15             Perform Capillary Blood Glucose checks as needed.  Exercise Prescription Changes:   Exercise Prescription Changes     Row Name 04/05/21 0900 04/26/21 1000 05/08/21 0900 05/23/21 1100       Response to Exercise   Blood Pressure (Admit) 132/72 118/60 110/70 124/76    Blood Pressure (Exercise) 162/70 128/72 148/66 128/70    Blood Pressure (Exit) 140/70 106/62 132/58 136/62    Heart Rate (Admit) 84 bpm 56 bpm 66 bpm 66 bpm    Heart Rate (Exercise) 104 bpm 81 bpm 101 bpm 76 bpm    Heart Rate (Exit) 88 bpm 69 bpm 92 bpm 63 bpm    Oxygen Saturation (Admit) 94 % 91 % 90 % 89 %    Oxygen Saturation (Exercise) 80 % 83 % 82 % 88 %  Oxygen Saturation (Exit) 94 % 91 % 91 % 90 %    Rating of Perceived Exertion (Exercise) 15 15 17 13     Perceived Dyspnea (Exercise) 2 3 2 3     Symptoms SOB SOB SOB SOB    Comments walk test results first full day of exercise -- --    Duration -- Progress to 30 minutes of  aerobic without signs/symptoms of physical distress Progress to 30 minutes of  aerobic without signs/symptoms of physical distress  Progress to 30 minutes of  aerobic without signs/symptoms of physical distress    Intensity -- THRR unchanged THRR unchanged THRR unchanged         Progression   Progression -- Continue to progress workloads to maintain intensity without signs/symptoms of physical distress. Continue to progress workloads to maintain intensity without signs/symptoms of physical distress. Continue to progress workloads to maintain intensity without signs/symptoms of physical distress.    Average METs -- 2.17 2.07 2.9         Resistance Training   Training Prescription -- Yes Yes Yes    Weight -- 3 lb 3 lb 3 lb    Reps -- 10-15 10-15 10-15         Interval Training   Interval Training -- No No No         Treadmill   MPH -- 0.9 1.2 0.8    Grade -- 0 0 0    Minutes -- 15 15 15     METs -- 1 1.92 2.3         Recumbant Bike   Level -- 1 -- --    Minutes -- 15 -- --    METs -- 2.84 -- --         NuStep   Level -- -- 2 --    Minutes -- -- 15 --    METs -- -- 2.7 --         REL-XR   Level -- -- 1 1    Minutes -- -- 15 15    METs -- -- -- 3.5         T5 Nustep   Level -- 1 1 --    Minutes -- 15 15 --    METs -- 2 1.9 --         Biostep-RELP   Level -- -- 1 --    Minutes -- -- 15 --    METs -- -- 2 --         Track   Laps -- 15 15 --    Minutes -- 15 15 --    METs -- 1.82 1.82 --             Exercise Comments:   Exercise Comments     Row Name 04/12/21 1157           Exercise Comments First full day of exercise!  Patient was oriented to gym and equipment including functions, settings, policies, and procedures.  Patient's individual exercise prescription and treatment plan were reviewed.  All starting workloads were established based on the results of the 6 minute walk test done at initial orientation visit.  The plan for exercise progression was also introduced and progression will be customized based on patient's performance and goals.                Exercise  Goals and Review:   Exercise Goals     Row Name 04/05/21 1000  Exercise Goals   Increase Physical Activity Yes       Intervention Provide advice, education, support and counseling about physical activity/exercise needs.;Develop an individualized exercise prescription for aerobic and resistive training based on initial evaluation findings, risk stratification, comorbidities and participant's personal goals.       Expected Outcomes Short Term: Attend rehab on a regular basis to increase amount of physical activity.;Long Term: Add in home exercise to make exercise part of routine and to increase amount of physical activity.;Long Term: Exercising regularly at least 3-5 days a week.       Increase Strength and Stamina Yes       Intervention Provide advice, education, support and counseling about physical activity/exercise needs.;Develop an individualized exercise prescription for aerobic and resistive training based on initial evaluation findings, risk stratification, comorbidities and participant's personal goals.       Expected Outcomes Short Term: Increase workloads from initial exercise prescription for resistance, speed, and METs.;Short Term: Perform resistance training exercises routinely during rehab and add in resistance training at home;Long Term: Improve cardiorespiratory fitness, muscular endurance and strength as measured by increased METs and functional capacity (6MWT)       Able to understand and use rate of perceived exertion (RPE) scale Yes       Intervention Provide education and explanation on how to use RPE scale       Expected Outcomes Short Term: Able to use RPE daily in rehab to express subjective intensity level;Long Term:  Able to use RPE to guide intensity level when exercising independently       Able to understand and use Dyspnea scale Yes       Intervention Provide education and explanation on how to use Dyspnea scale       Expected Outcomes Short Term: Able to  use Dyspnea scale daily in rehab to express subjective sense of shortness of breath during exertion;Long Term: Able to use Dyspnea scale to guide intensity level when exercising independently       Knowledge and understanding of Target Heart Rate Range (THRR) Yes       Intervention Provide education and explanation of THRR including how the numbers were predicted and where they are located for reference       Expected Outcomes Short Term: Able to state/look up THRR;Short Term: Able to use daily as guideline for intensity in rehab;Long Term: Able to use THRR to govern intensity when exercising independently       Able to check pulse independently Yes       Intervention Provide education and demonstration on how to check pulse in carotid and radial arteries.;Review the importance of being able to check your own pulse for safety during independent exercise       Expected Outcomes Short Term: Able to explain why pulse checking is important during independent exercise;Long Term: Able to check pulse independently and accurately       Understanding of Exercise Prescription Yes       Intervention Provide education, explanation, and written materials on patient's individual exercise prescription       Expected Outcomes Short Term: Able to explain program exercise prescription;Long Term: Able to explain home exercise prescription to exercise independently                Exercise Goals Re-Evaluation :  Exercise Goals Re-Evaluation     Row Name 04/12/21 1157 04/26/21 1033 05/08/21 0924 05/23/21 1158 06/04/21 1043     Exercise Goal Re-Evaluation   Exercise  Goals Review Able to understand and use rate of perceived exertion (RPE) scale;Knowledge and understanding of Target Heart Rate Range (THRR);Able to understand and use Dyspnea scale;Understanding of Exercise Prescription Increase Physical Activity;Understanding of Exercise Prescription;Increase Strength and Stamina Increase Physical  Activity;Understanding of Exercise Prescription;Increase Strength and Stamina Increase Physical Activity;Increase Strength and Stamina Increase Physical Activity;Increase Strength and Stamina   Comments Reviewed RPE and dyspnea scales, THR and program prescription with pt today.  Pt voiced understanding and was given a copy of goals to take home. Bradley Bowers is doing well the first couple sessions he has been here for rehab. He recently got clearance to maintain O2 sats > 83% per MD as patient wants to progress without oxygen, per doctor notes. Patient has already tolerated 15 laps on the track- he is slowly working up his speed on the treadmill while maintain good oxygen saturation. Will continue to monitor for progression. Bradley Bowers continues to do well in rehab.   He is doing well at monitoring his oxygen levels closely and will take rest breaks when they drop too low to allow them to recover.  We will continue to montior his progress. Bradley Bowers continues to work on endurance on TM and being able to walk longer before stopping due to oxygen.  We will continue to monitor progress. Bradley Bowers has not been to rehab recently as he was in the hospital for problems with his oxygen. He recently saw Dr. Sabra Heck who has prescribed oxygen for patient and patient is due back at rehab tomorrow. We will continue to monitor his progression and hope to see improvement with his new O2 prescription and hopefully higher oxygen saturations.   Expected Outcomes Short: Use RPE daily to regulate intensity. Long: Follow program prescription in THR. Short: Increase speed on treadmill as tolerated Long: Continue to build up strength and stamina -- Short: continue to work on walking longer Long:  walk with oxygen staying 88% or above Short: Adjust to exercise with O2 prescription Long: Exercise independently with appropriate O2 levels    Row Name 07/02/21 1036             Exercise Goal Re-Evaluation   Exercise Goals Review Increase Physical  Activity;Increase Strength and Stamina       Comments Bradley Bowers has been on medical hold by his doctor. He is waiting for an appointment in October and will need clearance to come back.       Expected Outcomes Short: Return to rehab with clearance Long: Continue to build up strength and stamina                Discharge Exercise Prescription (Final Exercise Prescription Changes):  Exercise Prescription Changes - 05/23/21 1100       Response to Exercise   Blood Pressure (Admit) 124/76    Blood Pressure (Exercise) 128/70    Blood Pressure (Exit) 136/62    Heart Rate (Admit) 66 bpm    Heart Rate (Exercise) 76 bpm    Heart Rate (Exit) 63 bpm    Oxygen Saturation (Admit) 89 %    Oxygen Saturation (Exercise) 88 %    Oxygen Saturation (Exit) 90 %    Rating of Perceived Exertion (Exercise) 13    Perceived Dyspnea (Exercise) 3    Symptoms SOB    Duration Progress to 30 minutes of  aerobic without signs/symptoms of physical distress    Intensity THRR unchanged      Progression   Progression Continue to progress workloads to maintain intensity without signs/symptoms  of physical distress.    Average METs 2.9      Resistance Training   Training Prescription Yes    Weight 3 lb    Reps 10-15      Interval Training   Interval Training No      Treadmill   MPH 0.8    Grade 0    Minutes 15    METs 2.3      REL-XR   Level 1    Minutes 15    METs 3.5             Nutrition:  Target Goals: Understanding of nutrition guidelines, daily intake of sodium 1500mg , cholesterol 200mg , calories 30% from fat and 7% or less from saturated fats, daily to have 5 or more servings of fruits and vegetables.  Education: All About Nutrition: -Group instruction provided by verbal, written material, interactive activities, discussions, models, and posters to present general guidelines for heart healthy nutrition including fat, fiber, MyPlate, the role of sodium in heart healthy nutrition, utilization  of the nutrition label, and utilization of this knowledge for meal planning. Follow up email sent as well. Written material given at graduation. Flowsheet Row Pulmonary Rehab from 05/10/2021 in [facility] Cardiac and Pulmonary Rehab  Date 05/01/21  Educator [facility]  Instruction Review Code 1- Verbalizes Understanding       Biometrics:  Pre Biometrics - 04/05/21 0944       Pre Biometrics   Height 5' 6.75" (1.695 m)    Weight 208 lb (94.3 kg)    BMI (Calculated) 32.84    Single Leg Stand 1.64 seconds              Nutrition Therapy Plan and Nutrition Goals:  Nutrition Therapy & Goals - 05/15/21 0914       Nutrition Therapy   Diet Heart healthy, low Na, pulmonary MNT    Drug/Food Interactions Statins/Certain Fruits    Protein (specify units) 110g    Fiber 30 grams    Whole Grain Foods 3 servings    Saturated Fats 12 max. grams    Fruits and Vegetables 8 servings/day    Sodium 1.5 grams      Personal Nutrition Goals   Nutrition Goal ST: add protein to breakfast (nuts/seeds, boiled egg) LT: maintain changes made after heart event, meet protein and calorie needs    Comments He is trying to eat more fruit and vegetables. B: cereal (high in fiber) - 2 % milk L: banana or apple or yogurt - greek (with nuts and fruit) D: poultry, roast beef, vegetables (vegetable oil or chicken broth (low Na)) - does not add salt  S: ice cream sometimes. Drinks: water or fruit drink or diet sprite (trying to cut these out). Patient reports making many changes post heart event. Discussed heart healthy eating and pulmonary MNT.      Intervention Plan   Intervention Prescribe, educate and counsel regarding individualized specific dietary modifications aiming towards targeted core components such as weight, hypertension, lipid management, diabetes, heart failure and other comorbidities.;Nutrition handout(s) given to patient.    Expected Outcomes Short Term Goal: Understand basic principles of dietary content, such  as calories, fat, sodium, cholesterol and nutrients.;Short Term Goal: A plan has been developed with personal nutrition goals set during dietitian appointment.;Long Term Goal: Adherence to prescribed nutrition plan.             Nutrition Assessments:  MEDIFICTS Score Key: ?70 Need to make dietary changes  40-70 Heart Healthy Diet ?  40 Therapeutic Level Cholesterol Diet  Flowsheet Row Pulmonary Rehab from 04/05/2021 in [facility] Cardiac and Pulmonary Rehab  Picture Your Plate Total Score on Admission 58      Picture Your Plate Scores: <76 Unhealthy dietary pattern with much room for improvement. 41-50 Dietary pattern unlikely to meet recommendations for good health and room for improvement. 51-60 More healthful dietary pattern, with some room for improvement.  >60 Healthy dietary pattern, although there may be some specific behaviors that could be improved.   Nutrition Goals Re-Evaluation:  Nutrition Goals Re-Evaluation     [place] Name 05/10/21 1003             Goals   Comment Bradley Bowers deferred RD appointment last month but has scheduled time for next week.                Nutrition Goals Discharge (Final Nutrition Goals Re-Evaluation):  Nutrition Goals Re-Evaluation - 05/10/21 1003       Goals   Comment Bradley Bowers deferred RD appointment last month but has scheduled time for next week.             Psychosocial: Target Goals: Acknowledge presence or absence of significant depression and/or stress, maximize coping skills, provide positive support system. Participant is able to verbalize types and ability to use techniques and skills needed for reducing stress and depression.   Education: Stress, Anxiety, and Depression - Group verbal and visual presentation to define topics covered.  Reviews how body is impacted by stress, anxiety, and depression.  Also discusses healthy ways to reduce stress and to treat/manage anxiety and depression.  Written material given at  graduation.   Education: Sleep Hygiene -Provides group verbal and written instruction about how sleep can affect your health.  Define sleep hygiene, discuss sleep cycles and impact of sleep habits. Review good sleep hygiene tips.    Initial Review & Psychosocial Screening:  Initial Psych Review & Screening - 03/26/21 1404       Initial Review   Current issues with Current Psychotropic Meds;Current Anxiety/Panic      Family Dynamics   Good Support System? Yes    Comments His anxiety stems from COVID and his shortness of breath. He can look to his wife and nephew for support.      Barriers   Psychosocial barriers to participate in program The patient should benefit from training in stress management and relaxation.      Screening Interventions   Interventions Encouraged to exercise;To provide support and resources with identified psychosocial needs;Provide feedback about the scores to participant    Expected Outcomes Short Term goal: Utilizing psychosocial counselor, staff and physician to assist with identification of specific Stressors or current issues interfering with healing process. Setting desired goal for each stressor or current issue identified.;Long Term Goal: Stressors or current issues are controlled or eliminated.;Short Term goal: Identification and review with participant of any Quality of Life or Depression concerns found by scoring the questionnaire.;Long Term goal: The participant improves quality of Life and PHQ9 Scores as seen by post scores and/or verbalization of changes             Quality of Life Scores:  Scores of 19 and below usually indicate a poorer quality of life in these areas.  A difference of  2-3 points is a clinically meaningful difference.  A difference of 2-3 points in the total score of the Quality of Life Index has been associated with significant improvement in overall quality of life, self-image,  physical symptoms, and general health in studies  assessing change in quality of life.  PHQ-9: Recent Review Flowsheet Data     Depression screen [facility] 2/9 04/05/2021   Decreased Interest 0   Down, Depressed, Hopeless 0   PHQ - 2 Score 0   Altered sleeping 1   Tired, decreased energy 2   Change in appetite 1   Feeling bad or failure about yourself  0   Trouble concentrating 0   Moving slowly or fidgety/restless 0   Suicidal thoughts 0   PHQ-9 Score 4   Difficult doing work/chores Not difficult at all      Interpretation of Total Score  Total Score Depression Severity:  1-4 = Minimal depression, 5-9 = Mild depression, 10-14 = Moderate depression, 15-19 = Moderately severe depression, 20-27 = Severe depression   Psychosocial Evaluation and Intervention:   Psychosocial Re-Evaluation:  Psychosocial Re-Evaluation     [place] Name 05/10/21 1001             Psychosocial Re-Evaluation   Comments Bradley Bowers reports no stress anxiety or depression.  He sleeps 7-8 hours and sleeps well.       Expected Outcomes Short: notify staff of any changes in symptoms Long:  maintain positive outlook                Psychosocial Discharge (Final Psychosocial Re-Evaluation):  Psychosocial Re-Evaluation - 05/10/21 1001       Psychosocial Re-Evaluation   Comments Bradley Bowers reports no stress anxiety or depression.  He sleeps 7-8 hours and sleeps well.    Expected Outcomes Short: notify staff of any changes in symptoms Long:  maintain positive outlook             Education: Education Goals: Education classes will be provided on a weekly basis, covering required topics. Participant will state understanding/return demonstration of topics presented.  Learning Barriers/Preferences:  Learning Barriers/Preferences - 03/26/21 1403       Learning Barriers/Preferences   Learning Barriers None    Learning Preferences None             General Pulmonary Education Topics:  Infection Prevention: - Provides verbal and written material to  individual with discussion of infection control including proper hand washing and proper equipment cleaning during exercise session. Flowsheet Row Pulmonary Rehab from 05/10/2021 in [facility] Cardiac and Pulmonary Rehab  Date 03/26/21  Educator [facility]  Instruction Review Code 1- Verbalizes Understanding       Falls Prevention: - Provides verbal and written material to individual with discussion of falls prevention and safety. Flowsheet Row Pulmonary Rehab from 05/10/2021 in [facility] Cardiac and Pulmonary Rehab  Date 03/26/21  Educator [facility]  Instruction Review Code 1- Verbalizes Understanding       Chronic Lung Disease Review: - Group verbal instruction with posters, models, PowerPoint presentations and videos,  to review new updates, new respiratory medications, new advancements in procedures and treatments. Providing information on websites and "800" numbers for continued self-education. Includes information about supplement oxygen, available portable oxygen systems, continuous and intermittent flow rates, oxygen safety, concentrators, and Medicare reimbursement for oxygen. Explanation of Pulmonary Drugs, including class, frequency, complications, importance of spacers, rinsing mouth after steroid MDI's, and proper cleaning methods for nebulizers. Review of basic lung anatomy and physiology related to function, structure, and complications of lung disease. Review of risk factors. Discussion about methods for diagnosing sleep apnea and types of masks and machines for OSA. Includes a review of the use of types of environmental controls:  home humidity, furnaces, filters, dust mite/pet prevention, HEPA vacuums. Discussion about weather changes, air quality and the benefits of nasal washing. Instruction on Warning signs, infection symptoms, calling MD promptly, preventive modes, and value of vaccinations. Review of effective airway clearance, coughing and/or vibration techniques. Emphasizing that all should Create an  Action Plan. Written material given at graduation. Flowsheet Row Pulmonary Rehab from 05/10/2021 in [facility] Cardiac and Pulmonary Rehab  Date 05/10/21  Educator [facility]  Instruction Review Code 1- Verbalizes Understanding       AED/CPR: - Group verbal and written instruction with the use of models to demonstrate the basic use of the AED with the basic ABC's of resuscitation.    Anatomy and Cardiac Procedures: - Group verbal and visual presentation and models provide information about basic cardiac anatomy and function. Reviews the testing methods done to diagnose heart disease and the outcomes of the test results. Describes the treatment choices: Medical Management, Angioplasty, or Coronary Bypass Surgery for treating various heart conditions including Myocardial Infarction, Angina, Valve Disease, and Cardiac Arrhythmias.  Written material given at graduation.   Medication Safety: - Group verbal and visual instruction to review commonly prescribed medications for heart and lung disease. Reviews the medication, class of the drug, and side effects. Includes the steps to properly store meds and maintain the prescription regimen.  Written material given at graduation. Flowsheet Row Pulmonary Rehab from 05/10/2021 in [facility] Cardiac and Pulmonary Rehab  Date 04/26/21  Educator SB  Instruction Review Code 1- Verbalizes Understanding       Other: -Provides group and verbal instruction on various topics (see comments)   Knowledge Questionnaire Score:  Knowledge Questionnaire Score - 04/05/21 0947       Knowledge Questionnaire Score   Pre Score 16/18: Oxygen sats, Oxygen usage              Core Components/Risk Factors/Patient Goals at Admission:  Personal Goals and Risk Factors at Admission - 04/05/21 1000       Core Components/Risk Factors/Patient Goals on Admission    Weight Management Yes;Weight Loss    Intervention Weight Management: Develop a combined nutrition and exercise program  designed to reach desired caloric intake, while maintaining appropriate intake of nutrient and fiber, sodium and fats, and appropriate energy expenditure required for the weight goal.;Weight Management: Provide education and appropriate resources to help participant work on and attain dietary goals.;Weight Management/Obesity: Establish reasonable short term and long term weight goals.    Admit Weight 208 lb (94.3 kg)    Goal Weight: Short Term 203 lb (92.1 kg)    Goal Weight: Long Term 195 lb (88.5 kg)    Expected Outcomes Short Term: Continue to assess and modify interventions until short term weight is achieved;Weight Loss: Understanding of general recommendations for a balanced deficit meal plan, which promotes 1-2 lb weight loss per week and includes a negative energy balance of [phone] kcal/d;Long Term: Adherence to nutrition and physical activity/exercise program aimed toward attainment of established weight goal;Understanding recommendations for meals to include 15-35% energy as protein, 25-35% energy from fat, 35-60% energy from carbohydrates, less than 200mg  of dietary cholesterol, 20-35 gm of total fiber daily;Understanding of distribution of calorie intake throughout the day with the consumption of 4-5 meals/snacks    Improve shortness of breath with ADL's Yes    Intervention Provide education, individualized exercise plan and daily activity instruction to help decrease symptoms of SOB with activities of daily living.    Expected Outcomes Short Term: Improve cardiorespiratory fitness  to achieve a reduction of symptoms when performing ADLs;Long Term: Be able to perform more ADLs without symptoms or delay the onset of symptoms    Hypertension Yes    Intervention Provide education on lifestyle modifcations including regular physical activity/exercise, weight management, moderate sodium restriction and increased consumption of fresh fruit, vegetables, and low fat dairy, alcohol moderation, and  smoking cessation.;Monitor prescription use compliance.    Expected Outcomes Short Term: Continued assessment and intervention until BP is < 140/46mm HG in hypertensive participants. < 130/28mm HG in hypertensive participants with diabetes, heart failure or chronic kidney disease.;Long Term: Maintenance of blood pressure at goal levels.    Lipids Yes    Intervention Provide education and support for participant on nutrition & aerobic/resistive exercise along with prescribed medications to achieve LDL 70mg , HDL >40mg .    Expected Outcomes Short Term: Participant states understanding of desired cholesterol values and is compliant with medications prescribed. Participant is following exercise prescription and nutrition guidelines.;Long Term: Cholesterol controlled with medications as prescribed, with individualized exercise RX and with personalized nutrition plan. Value goals: LDL < 70mg , HDL > 40 mg.             Education:Diabetes - Individual verbal and written instruction to review signs/symptoms of diabetes, desired ranges of glucose level fasting, after meals and with exercise. Acknowledge that pre and post exercise glucose checks will be done for 3 sessions at entry of program.   Know Your Numbers and Heart Failure: - Group verbal and visual instruction to discuss disease risk factors for cardiac and pulmonary disease and treatment options.  Reviews associated critical values for Overweight/Obesity, Hypertension, Cholesterol, and Diabetes.  Discusses basics of heart failure: signs/symptoms and treatments.  Introduces Heart Failure Zone chart for action plan for heart failure.  Written material given at graduation. Flowsheet Row Pulmonary Rehab from 05/10/2021 in [facility] Cardiac and Pulmonary Rehab  Date 05/03/21  Educator SB  Instruction Review Code 1- Verbalizes Understanding       Core Components/Risk Factors/Patient Goals Review:   Goals and Risk Factor Review     Row Name 05/10/21  0957             Core Components/Risk Factors/Patient Goals Review   Personal Goals Review Weight Management/Obesity;Improve shortness of breath with ADL's;Hypertension       Review Bradley Bowers feels he is making progress.  He is able to do about 10 min total now.  He is able to do most ADLs without trouble except walking to his car.  He is in his third week of sessions.  He reports taking all medications as directed.  He does monitor oxygen at home.  He doesnt monitor BP at home.  His weight is down some today. He can tell his pants fit looser.       Expected Outcomes Short:  continue to check oxygen at home and work on walking Long:  manage risk factors                Core Components/Risk Factors/Patient Goals at Discharge (Final Review):   Goals and Risk Factor Review - 05/10/21 0957       Core Components/Risk Factors/Patient Goals Review   Personal Goals Review Weight Management/Obesity;Improve shortness of breath with ADL's;Hypertension    Review Bradley Bowers feels he is making progress.  He is able to do about 10 min total now.  He is able to do most ADLs without trouble except walking to his car.  He is in his third week of  sessions.  He reports taking all medications as directed.  He does monitor oxygen at home.  He doesnt monitor BP at home.  His weight is down some today. He can tell his pants fit looser.    Expected Outcomes Short:  continue to check oxygen at home and work on walking Long:  manage risk factors             ITP Comments:  ITP Comments     Row Name 03/26/21 1402 04/05/21 0940 04/12/21 1157 04/18/21 1203 04/19/21 1054   ITP Comments Virtual Visit completed. Patient informed on EP and RD appointment and 6 Minute walk test. Patient also informed of patient health questionnaires on My Chart. Patient Verbalizes understanding. Visit diagnosis can be found in [facility] 02/28/2021. Completed 6MWT and gym orientation. Initial ITP created and sent for review to Dr. Ottie Glazier,  Medical Director. First full day of exercise!  Patient was oriented to gym and equipment including functions, settings, policies, and procedures.  Patient's individual exercise prescription and treatment plan were reviewed.  All starting workloads were established based on the results of the 6 minute walk test done at initial orientation visit.  The plan for exercise progression was also introduced and progression will be customized based on patient's performance and goals. 30 Day review completed. Medical Director ITP review done, changes made as directed, and signed approval by Medical Director. Patient oxygen saturations dropped routinely throughout rehab. Patient does not want to use oxygen, per Dr. Sabra Heck, "He may exercise without O2- stop as he needs during exercise, only stop exercise if pulse ox drops less than 83%." Staff has doctor's orders note with chart for documentation.    [place] Name 05/15/21 0902 05/16/21 0829 06/06/21 1401 06/13/21 0802 06/19/21 1422   ITP Comments Completed initial RD consultation 30 Day review completed. Medical Director ITP review done, changes made as directed, and signed approval by Medical Director. --  Spoke with Bills wife - he saw lung Dr today and they want him to wait until hes a little stronger to return.  Staff will take him off schedule for 2 weeks and they will call when he can return. 30 Day review completed. Medical Director ITP review done, changes made as directed, and signed approval by Medical Director. Bills wife called and siad he is waiting until he sees Dr Sabra Heck next month to come back to [place].  He is on medical hold until we recieve clearance.    [place] Name 07/11/21 1435           ITP Comments 30 day review completed. ITP sent to Dr. Zetta Bills, Medical Director of  Pulmonary Rehab. Continue with ITP unless changes are made by physician.                Comments: 30 day review

## 2021-07-18 ENCOUNTER — Telehealth: Payer: Self-pay

## 2021-07-18 NOTE — Telephone Encounter (Signed)
LMOM -no answer home number

## 2021-07-23 DIAGNOSIS — J449 Chronic obstructive pulmonary disease, unspecified: Secondary | ICD-10-CM | POA: Diagnosis not present

## 2021-07-25 DIAGNOSIS — G4733 Obstructive sleep apnea (adult) (pediatric): Secondary | ICD-10-CM | POA: Diagnosis not present

## 2021-07-25 DIAGNOSIS — J449 Chronic obstructive pulmonary disease, unspecified: Secondary | ICD-10-CM | POA: Diagnosis not present

## 2021-07-30 ENCOUNTER — Telehealth: Payer: Self-pay | Admitting: *Deleted

## 2021-07-30 ENCOUNTER — Encounter: Payer: Self-pay | Admitting: *Deleted

## 2021-07-30 DIAGNOSIS — J449 Chronic obstructive pulmonary disease, unspecified: Secondary | ICD-10-CM

## 2021-07-30 NOTE — Telephone Encounter (Signed)
Called to check on patient.  He does not wish to return to program at this time.  He has not attended since 05/15/2021.  We will discharge him at this time.

## 2021-07-30 NOTE — Progress Notes (Signed)
Pulmonary Individual Treatment Plan  Patient Details  Name: Bradley Bowers MRN: [id number] Date of Birth: 1945-12-15 Referring Provider:   April Manson Pulmonary Rehab from 04/05/2021 in [facility] Cardiac and Pulmonary Rehab  Referring Provider Emily Filbert MD       Initial Encounter Date:  Flowsheet Row Pulmonary Rehab from 04/05/2021 in [facility] Cardiac and Pulmonary Rehab  Date 04/05/21       Visit Diagnosis: Chronic obstructive pulmonary disease, unspecified COPD type ([place])  Patient's Home Medications on Admission:  Current Outpatient Medications:    ADVAIR DISKUS 100-50 MCG/ACT AEPB, Inhale 1 puff into the lungs 2 (two) times daily., Disp: , Rfl:    albuterol (VENTOLIN HFA) 108 (90 Base) MCG/ACT inhaler, Inhale 1-2 puffs into the lungs every 4 (four) hours as needed., Disp: , Rfl:    aspirin 81 MG EC tablet, Take 81 mg by mouth daily., Disp: , Rfl: 11   atenolol (TENORMIN) 50 MG tablet, Take 50 mg by mouth daily., Disp: , Rfl:    atorvastatin (LIPITOR) 80 MG tablet, Take 40 mg by mouth daily., Disp: , Rfl:    carbamazepine (TEGRETOL) 200 MG tablet, Take 1 tablet by mouth 2 (two) times daily., Disp: , Rfl:    divalproex (DEPAKOTE ER) 500 MG 24 hr tablet, Take 3 tablets (1,500 mg total) by mouth at bedtime., Disp: 90 tablet, Rfl: 1   donepezil (ARICEPT) 5 MG tablet, Take 5 mg by mouth at bedtime., Disp: , Rfl:    fluticasone (FLONASE) 50 MCG/ACT nasal spray, Place 1 spray into both nostrils daily., Disp: , Rfl:    fluticasone furoate-vilanterol (BREO ELLIPTA) 100-25 MCG/INH AEPB, Inhale 1 puff into the lungs daily., Disp: , Rfl:    furosemide (LASIX) 20 MG tablet, Take 1 tablet (20 mg total) by mouth daily., Disp: 60 tablet, Rfl: 11   gabapentin (NEURONTIN) 300 MG capsule, Take 300 mg by mouth at bedtime., Disp: , Rfl:    lisinopril (ZESTRIL) 5 MG tablet, Take 1 tablet by mouth daily., Disp: , Rfl:    montelukast (SINGULAIR) 10 MG tablet, Take 10 mg by mouth at bedtime., Disp: , Rfl:     OLANZapine (ZYPREXA) 5 MG tablet, Take 5 mg by mouth at bedtime., Disp: , Rfl:    pantoprazole (PROTONIX) 40 MG tablet, Take 40 mg by mouth 2 (two) times daily., Disp: , Rfl:    predniSONE (DELTASONE) 20 MG tablet, Take 2 tablets (40 mg total) by mouth daily with breakfast., Disp: 2 tablet, Rfl: 0   tiotropium (SPIRIVA) 18 MCG inhalation capsule, Place 18 mcg into inhaler and inhale daily., Disp: , Rfl:    vitamin B-12 1000 MCG tablet, Take 1 tablet (1,000 mcg total) by mouth daily., Disp: 30 tablet, Rfl: 1  Past Medical History: Past Medical History:  Diagnosis Date   Arterial vascular disease 10/24/2014   Overview:  MI 1994, small-vessel disease requiring coumadin    Asthma    Centrilobular emphysema ([place]) 10/24/2014   Overview:  2L O2 prn    Centrilobular emphysema ([facility])    Chronic obstructive pulmonary disease ([place]) 11/06/2015   Coronary artery disease    GERD (gastroesophageal reflux disease)    Glaucoma    Heart attack ([place]) 2/17, 1994   Heart disease    Hyperlipemia    Hypertension    Restless leg 10/24/2014   Sleep apnea     Tobacco Use: Social History   Tobacco Use  Smoking Status Former   Packs/day: 1.00   Years: 30.00  Pack years: 30.00   Types: Cigarettes   Quit date: 03/07/1994   Years since quitting: 27.4  Smokeless Tobacco Never    Labs: Recent Review Flowsheet Data     Labs for ITP Cardiac and Pulmonary Rehab Latest Ref Rng & Units 09/24/2016   Cholestrol 0 - 200 mg/dL 177   LDLCALC 0 - 99 mg/dL 111(H)   HDL >40 mg/dL 41   Trlycerides <150 mg/dL 126   Hemoglobin A1c 4.8 - 5.6 % 5.2        Pulmonary Assessment Scores:  Pulmonary Assessment Scores     Row Name 04/05/21 0945         ADL UCSD   ADL Phase Entry     SOB Score total 19     Rest 0     Walk 2     Stairs 3     Bath 0     Dress 1     Shop 0       CAT Score   CAT Score 16       mMRC Score   mMRC Score 2              UCSD: Self-administered rating of dyspnea  associated with activities of daily living (ADLs) 6-point scale (0 = "not at all" to 5 = "maximal or unable to do because of breathlessness")  Scoring Scores range from 0 to 120.  Minimally important difference is 5 units  CAT: CAT can identify the health impairment of COPD patients and is better correlated with disease progression.  CAT has a scoring range of zero to 40. The CAT score is classified into four groups of low (less than 10), medium (10 - 20), high (21-30) and very high (31-40) based on the impact level of disease on health status. A CAT score over 10 suggests significant symptoms.  A worsening CAT score could be explained by an exacerbation, poor medication adherence, poor inhaler technique, or progression of COPD or comorbid conditions.  CAT MCID is 2 points  mMRC: mMRC (Modified Medical Research Council) Dyspnea Scale is used to assess the degree of baseline functional disability in patients of respiratory disease due to dyspnea. No minimal important difference is established. A decrease in score of 1 point or greater is considered a positive change.   Pulmonary Function Assessment:  Pulmonary Function Assessment - 03/26/21 1403       Breath   Shortness of Breath Yes;Limiting activity             Exercise Target Goals: Exercise Program Goal: Individual exercise prescription set using results from initial 6 min walk test and THRR while considering  patient's activity barriers and safety.   Exercise Prescription Goal: Initial exercise prescription builds to 30-45 minutes a day of aerobic activity, 2-3 days per week.  Home exercise guidelines will be given to patient during program as part of exercise prescription that the participant will acknowledge.  Education: Aerobic Exercise: - Group verbal and visual presentation on the components of exercise prescription. Introduces F.I.T.T principle from ACSM for exercise prescriptions.  Reviews F.I.T.T. principles of aerobic  exercise including progression. Written material given at graduation.   Education: Resistance Exercise: - Group verbal and visual presentation on the components of exercise prescription. Introduces F.I.T.T principle from ACSM for exercise prescriptions  Reviews F.I.T.T. principles of resistance exercise including progression. Written material given at graduation.    Education: Exercise & Equipment Safety: - Individual verbal instruction and demonstration of equipment use and  safety with use of the equipment. Flowsheet Row Pulmonary Rehab from 05/10/2021 in [facility] Cardiac and Pulmonary Rehab  Date 03/26/21  Educator [facility]  Instruction Review Code 1- Verbalizes Understanding       Education: Exercise Physiology & General Exercise Guidelines: - Group verbal and written instruction with models to review the exercise physiology of the cardiovascular system and associated critical values. Provides general exercise guidelines with specific guidelines to those with heart or lung disease.    Education: Flexibility, Balance, Mind/Body Relaxation: - Group verbal and visual presentation with interactive activity on the components of exercise prescription. Introduces F.I.T.T principle from ACSM for exercise prescriptions. Reviews F.I.T.T. principles of flexibility and balance exercise training including progression. Also discusses the mind body connection.  Reviews various relaxation techniques to help reduce and manage stress (i.e. Deep breathing, progressive muscle relaxation, and visualization). Balance handout provided to take home. Written material given at graduation. Flowsheet Row Pulmonary Rehab from 05/10/2021 in [facility] Cardiac and Pulmonary Rehab  Date 04/12/21  Educator [facility]  Instruction Review Code 1- Verbalizes Understanding       Activity Barriers & Risk Stratification:  Activity Barriers & Cardiac Risk Stratification - 04/05/21 0944       Activity Barriers & Cardiac Risk Stratification    Activity Barriers Shortness of Breath;Deconditioning;Muscular Weakness             6 Minute Walk:  6 Minute Walk     Row Name 04/05/21 0953         6 Minute Walk   Phase Initial     Distance 400 feet     Walk Time 2 minutes  Stopped early due to low O2 sats     # of Rest Breaks 0     MPH 2.27     METS 1.13     RPE 15     Perceived Dyspnea  2     VO2 Peak 3.96     Symptoms Yes (comment)     Comments SOB     Resting HR 84 bpm     Resting BP 132/72     Resting Oxygen Saturation  94 %     Exercise Oxygen Saturation  during 6 min walk 80 %  Stopped early     Max Ex. HR 104 bpm     Max Ex. BP 162/70     2 Minute Post BP 140/70       Interval HR   1 Minute HR 94     2 Minute HR 104     3 Minute HR --  Test stopped early     4 Minute HR --  Test stopped early     5 Minute HR --  Test stopped early     6 Minute HR --  Test stopped early     2 Minute Post HR 88     Interval Heart Rate? Yes       Interval Oxygen   Interval Oxygen? Yes     Baseline Oxygen Saturation % 94 %     1 Minute Oxygen Saturation % 87 %     1 Minute Liters of Oxygen 0 L  RA     2 Minute Oxygen Saturation % 80 %  Test stopped     2 Minute Liters of Oxygen 0 L     3 Minute Liters of Oxygen 0 L     4 Minute Liters of Oxygen 0 L     5 Minute Liters  of Oxygen 0 L     6 Minute Liters of Oxygen 0 L     2 Minute Post Oxygen Saturation % 90 %     2 Minute Post Liters of Oxygen 0 L             Oxygen Initial Assessment:  Oxygen Initial Assessment - 04/05/21 0945       Home Oxygen   Home Oxygen Device None    Sleep Oxygen Prescription None    Home Exercise Oxygen Prescription None    Home Resting Oxygen Prescription None    Compliance with Home Oxygen Use Yes      Initial 6 min Walk   Oxygen Used None      Program Oxygen Prescription   Program Oxygen Prescription None      Intervention   Short Term Goals To learn and understand importance of monitoring SPO2 with pulse oximeter and  demonstrate accurate use of the pulse oximeter.;To learn and exhibit compliance with exercise, home and travel O2 prescription;To learn and understand importance of maintaining oxygen saturations>88%;To learn and demonstrate proper pursed lip breathing techniques or other breathing techniques. ;To learn and demonstrate proper use of respiratory medications    Long  Term Goals Exhibits compliance with exercise, home  and travel O2 prescription;Maintenance of O2 saturations>88%;Verbalizes importance of monitoring SPO2 with pulse oximeter and return demonstration;Exhibits proper breathing techniques, such as pursed lip breathing or other method taught during program session;Compliance with respiratory medication;Demonstrates proper use of MDI's             Oxygen Re-Evaluation:  Oxygen Re-Evaluation     Row Name 04/19/21 1103             Program Oxygen Prescription   Program Oxygen Prescription None         Home Oxygen   Home Oxygen Device None       Sleep Oxygen Prescription None       Home Exercise Oxygen Prescription None  Patient approved to drop no less than 83% on RA, per Dr. Sabra Heck       Home Resting Oxygen Prescription None       Compliance with Home Oxygen Use Yes         Goals/Expected Outcomes   Short Term Goals To learn and understand importance of monitoring SPO2 with pulse oximeter and demonstrate accurate use of the pulse oximeter.;To learn and exhibit compliance with exercise, home and travel O2 prescription;To learn and understand importance of maintaining oxygen saturations>88%;To learn and demonstrate proper pursed lip breathing techniques or other breathing techniques. ;To learn and demonstrate proper use of respiratory medications       Long  Term Goals Exhibits compliance with exercise, home  and travel O2 prescription;Maintenance of O2 saturations>88%;Verbalizes importance of monitoring SPO2 with pulse oximeter and return demonstration;Exhibits proper breathing  techniques, such as pursed lip breathing or other method taught during program session;Compliance with respiratory medication;Demonstrates proper use of MDI's       Comments Per Dr. Sabra Heck, "He may exercise without O2- stop as he needs during exercise. Only stop exercise if pulse ox drops less than 83%." Note in chart for documentation.                Oxygen Discharge (Final Oxygen Re-Evaluation):  Oxygen Re-Evaluation - 04/19/21 1103       Program Oxygen Prescription   Program Oxygen Prescription None      Home Oxygen   Home Oxygen Device None  Sleep Oxygen Prescription None    Home Exercise Oxygen Prescription None   Patient approved to drop no less than 83% on RA, per Dr. Sabra Heck   Home Resting Oxygen Prescription None    Compliance with Home Oxygen Use Yes      Goals/Expected Outcomes   Short Term Goals To learn and understand importance of monitoring SPO2 with pulse oximeter and demonstrate accurate use of the pulse oximeter.;To learn and exhibit compliance with exercise, home and travel O2 prescription;To learn and understand importance of maintaining oxygen saturations>88%;To learn and demonstrate proper pursed lip breathing techniques or other breathing techniques. ;To learn and demonstrate proper use of respiratory medications    Long  Term Goals Exhibits compliance with exercise, home  and travel O2 prescription;Maintenance of O2 saturations>88%;Verbalizes importance of monitoring SPO2 with pulse oximeter and return demonstration;Exhibits proper breathing techniques, such as pursed lip breathing or other method taught during program session;Compliance with respiratory medication;Demonstrates proper use of MDI's    Comments Per Dr. Sabra Heck, "He may exercise without O2- stop as he needs during exercise. Only stop exercise if pulse ox drops less than 83%." Note in chart for documentation.             Initial Exercise Prescription:  Initial Exercise Prescription - 04/05/21  0900       Date of Initial Exercise RX and Referring Provider   Date 04/05/21    Referring Provider Emily Filbert MD      Treadmill   MPH 1.7    Grade 0    Minutes 15    METs 2.3      REL-XR   Level 1    Speed 50    Minutes 15    METs 1.1      T5 Nustep   Level 1    SPM 80    Minutes 15    METs 1.1      Prescription Details   Frequency (times per week) 2    Duration Progress to 30 minutes of continuous aerobic without signs/symptoms of physical distress      Intensity   THRR 40-80% of Max Heartrate 109-134    Ratings of Perceived Exertion 11-13    Perceived Dyspnea 0-4      Progression   Progression Continue to progress workloads to maintain intensity without signs/symptoms of physical distress.      Resistance Training   Training Prescription Yes    Weight 3 lb    Reps 10-15             Perform Capillary Blood Glucose checks as needed.  Exercise Prescription Changes:   Exercise Prescription Changes     Row Name 04/05/21 0900 04/26/21 1000 05/08/21 0900 05/23/21 1100       Response to Exercise   Blood Pressure (Admit) 132/72 118/60 110/70 124/76    Blood Pressure (Exercise) 162/70 128/72 148/66 128/70    Blood Pressure (Exit) 140/70 106/62 132/58 136/62    Heart Rate (Admit) 84 bpm 56 bpm 66 bpm 66 bpm    Heart Rate (Exercise) 104 bpm 81 bpm 101 bpm 76 bpm    Heart Rate (Exit) 88 bpm 69 bpm 92 bpm 63 bpm    Oxygen Saturation (Admit) 94 % 91 % 90 % 89 %    Oxygen Saturation (Exercise) 80 % 83 % 82 % 88 %    Oxygen Saturation (Exit) 94 % 91 % 91 % 90 %    Rating of Perceived Exertion (Exercise) 15 15  17 13    Perceived Dyspnea (Exercise) 2 3 2 3     Symptoms SOB SOB SOB SOB    Comments walk test results first full day of exercise -- --    Duration -- Progress to 30 minutes of  aerobic without signs/symptoms of physical distress Progress to 30 minutes of  aerobic without signs/symptoms of physical distress Progress to 30 minutes of  aerobic without  signs/symptoms of physical distress    Intensity -- THRR unchanged THRR unchanged THRR unchanged      Progression   Progression -- Continue to progress workloads to maintain intensity without signs/symptoms of physical distress. Continue to progress workloads to maintain intensity without signs/symptoms of physical distress. Continue to progress workloads to maintain intensity without signs/symptoms of physical distress.    Average METs -- 2.17 2.07 2.9      Resistance Training   Training Prescription -- Yes Yes Yes    Weight -- 3 lb 3 lb 3 lb    Reps -- 10-15 10-15 10-15      Interval Training   Interval Training -- No No No      Treadmill   MPH -- 0.9 1.2 0.8    Grade -- 0 0 0    Minutes -- 15 15 15     METs -- 1 1.92 2.3      Recumbant Bike   Level -- 1 -- --    Minutes -- 15 -- --    METs -- 2.84 -- --      NuStep   Level -- -- 2 --    Minutes -- -- 15 --    METs -- -- 2.7 --      REL-XR   Level -- -- 1 1    Minutes -- -- 15 15    METs -- -- -- 3.5      T5 Nustep   Level -- 1 1 --    Minutes -- 15 15 --    METs -- 2 1.9 --      Biostep-RELP   Level -- -- 1 --    Minutes -- -- 15 --    METs -- -- 2 --      Track   Laps -- 15 15 --    Minutes -- 15 15 --    METs -- 1.82 1.82 --             Exercise Comments:   Exercise Comments     Row Name 04/12/21 1157           Exercise Comments First full day of exercise!  Patient was oriented to gym and equipment including functions, settings, policies, and procedures.  Patient's individual exercise prescription and treatment plan were reviewed.  All starting workloads were established based on the results of the 6 minute walk test done at initial orientation visit.  The plan for exercise progression was also introduced and progression will be customized based on patient's performance and goals.                Exercise Goals and Review:   Exercise Goals     Row Name 04/05/21 1000              Exercise Goals   Increase Physical Activity Yes       Intervention Provide advice, education, support and counseling about physical activity/exercise needs.;Develop an individualized exercise prescription for aerobic and resistive training based on initial evaluation findings, risk stratification, comorbidities and participant's  personal goals.       Expected Outcomes Short Term: Attend rehab on a regular basis to increase amount of physical activity.;Long Term: Add in home exercise to make exercise part of routine and to increase amount of physical activity.;Long Term: Exercising regularly at least 3-5 days a week.       Increase Strength and Stamina Yes       Intervention Provide advice, education, support and counseling about physical activity/exercise needs.;Develop an individualized exercise prescription for aerobic and resistive training based on initial evaluation findings, risk stratification, comorbidities and participant's personal goals.       Expected Outcomes Short Term: Increase workloads from initial exercise prescription for resistance, speed, and METs.;Short Term: Perform resistance training exercises routinely during rehab and add in resistance training at home;Long Term: Improve cardiorespiratory fitness, muscular endurance and strength as measured by increased METs and functional capacity (6MWT)       Able to understand and use rate of perceived exertion (RPE) scale Yes       Intervention Provide education and explanation on how to use RPE scale       Expected Outcomes Short Term: Able to use RPE daily in rehab to express subjective intensity level;Long Term:  Able to use RPE to guide intensity level when exercising independently       Able to understand and use Dyspnea scale Yes       Intervention Provide education and explanation on how to use Dyspnea scale       Expected Outcomes Short Term: Able to use Dyspnea scale daily in rehab to express subjective sense of shortness of breath  during exertion;Long Term: Able to use Dyspnea scale to guide intensity level when exercising independently       Knowledge and understanding of Target Heart Rate Range (THRR) Yes       Intervention Provide education and explanation of THRR including how the numbers were predicted and where they are located for reference       Expected Outcomes Short Term: Able to state/look up THRR;Short Term: Able to use daily as guideline for intensity in rehab;Long Term: Able to use THRR to govern intensity when exercising independently       Able to check pulse independently Yes       Intervention Provide education and demonstration on how to check pulse in carotid and radial arteries.;Review the importance of being able to check your own pulse for safety during independent exercise       Expected Outcomes Short Term: Able to explain why pulse checking is important during independent exercise;Long Term: Able to check pulse independently and accurately       Understanding of Exercise Prescription Yes       Intervention Provide education, explanation, and written materials on patient's individual exercise prescription       Expected Outcomes Short Term: Able to explain program exercise prescription;Long Term: Able to explain home exercise prescription to exercise independently                Exercise Goals Re-Evaluation :  Exercise Goals Re-Evaluation     Row Name 04/12/21 1157 04/26/21 1033 05/08/21 0924 05/23/21 1158 06/04/21 1043     Exercise Goal Re-Evaluation   Exercise Goals Review Able to understand and use rate of perceived exertion (RPE) scale;Knowledge and understanding of Target Heart Rate Range (THRR);Able to understand and use Dyspnea scale;Understanding of Exercise Prescription Increase Physical Activity;Understanding of Exercise Prescription;Increase Strength and Stamina Increase Physical Activity;Understanding of Exercise  Prescription;Increase Strength and Stamina Increase Physical  Activity;Increase Strength and Stamina Increase Physical Activity;Increase Strength and Stamina   Comments Reviewed RPE and dyspnea scales, THR and program prescription with pt today.  Pt voiced understanding and was given a copy of goals to take home. Rush Landmark is doing well the first couple sessions he has been here for rehab. He recently got clearance to maintain O2 sats > 83% per MD as patient wants to progress without oxygen, per doctor notes. Patient has already tolerated 15 laps on the track- he is slowly working up his speed on the treadmill while maintain good oxygen saturation. Will continue to monitor for progression. Rush Landmark continues to do well in rehab.   He is doing well at monitoring his oxygen levels closely and will take rest breaks when they drop too low to allow them to recover.  We will continue to montior his progress. Rush Landmark continues to work on endurance on TM and being able to walk longer before stopping due to oxygen.  We will continue to monitor progress. Rush Landmark has not been to rehab recently as he was in the hospital for problems with his oxygen. He recently saw Dr. Sabra Heck who has prescribed oxygen for patient and patient is due back at rehab tomorrow. We will continue to monitor his progression and hope to see improvement with his new O2 prescription and hopefully higher oxygen saturations.   Expected Outcomes Short: Use RPE daily to regulate intensity. Long: Follow program prescription in THR. Short: Increase speed on treadmill as tolerated Long: Continue to build up strength and stamina -- Short: continue to work on walking longer Long:  walk with oxygen staying 88% or above Short: Adjust to exercise with O2 prescription Long: Exercise independently with appropriate O2 levels    Row Name 07/02/21 1036             Exercise Goal Re-Evaluation   Exercise Goals Review Increase Physical Activity;Increase Strength and Stamina       Comments Rush Landmark has been on medical hold by his doctor. He is  waiting for an appointment in October and will need clearance to come back.       Expected Outcomes Short: Return to rehab with clearance Long: Continue to build up strength and stamina                Discharge Exercise Prescription (Final Exercise Prescription Changes):  Exercise Prescription Changes - 05/23/21 1100       Response to Exercise   Blood Pressure (Admit) 124/76    Blood Pressure (Exercise) 128/70    Blood Pressure (Exit) 136/62    Heart Rate (Admit) 66 bpm    Heart Rate (Exercise) 76 bpm    Heart Rate (Exit) 63 bpm    Oxygen Saturation (Admit) 89 %    Oxygen Saturation (Exercise) 88 %    Oxygen Saturation (Exit) 90 %    Rating of Perceived Exertion (Exercise) 13    Perceived Dyspnea (Exercise) 3    Symptoms SOB    Duration Progress to 30 minutes of  aerobic without signs/symptoms of physical distress    Intensity THRR unchanged      Progression   Progression Continue to progress workloads to maintain intensity without signs/symptoms of physical distress.    Average METs 2.9      Resistance Training   Training Prescription Yes    Weight 3 lb    Reps 10-15      Interval Training   Interval Training No  Treadmill   MPH 0.8    Grade 0    Minutes 15    METs 2.3      REL-XR   Level 1    Minutes 15    METs 3.5             Nutrition:  Target Goals: Understanding of nutrition guidelines, daily intake of sodium 1500mg , cholesterol 200mg , calories 30% from fat and 7% or less from saturated fats, daily to have 5 or more servings of fruits and vegetables.  Education: All About Nutrition: -Group instruction provided by verbal, written material, interactive activities, discussions, models, and posters to present general guidelines for heart healthy nutrition including fat, fiber, MyPlate, the role of sodium in heart healthy nutrition, utilization of the nutrition label, and utilization of this knowledge for meal planning. Follow up email sent as  well. Written material given at graduation. Flowsheet Row Pulmonary Rehab from 05/10/2021 in [facility] Cardiac and Pulmonary Rehab  Date 05/01/21  Educator [facility]  Instruction Review Code 1- Verbalizes Understanding       Biometrics:  Pre Biometrics - 04/05/21 0944       Pre Biometrics   Height 5' 6.75" (1.695 m)    Weight 208 lb (94.3 kg)    BMI (Calculated) 32.84    Single Leg Stand 1.64 seconds              Nutrition Therapy Plan and Nutrition Goals:  Nutrition Therapy & Goals - 05/15/21 0914       Nutrition Therapy   Diet Heart healthy, low Na, pulmonary MNT    Drug/Food Interactions Statins/Certain Fruits    Protein (specify units) 110g    Fiber 30 grams    Whole Grain Foods 3 servings    Saturated Fats 12 max. grams    Fruits and Vegetables 8 servings/day    Sodium 1.5 grams      Personal Nutrition Goals   Nutrition Goal ST: add protein to breakfast (nuts/seeds, boiled egg) LT: maintain changes made after heart event, meet protein and calorie needs    Comments He is trying to eat more fruit and vegetables. B: cereal (high in fiber) - 2 % milk L: banana or apple or yogurt - greek (with nuts and fruit) D: poultry, roast beef, vegetables (vegetable oil or chicken broth (low Na)) - does not add salt  S: ice cream sometimes. Drinks: water or fruit drink or diet sprite (trying to cut these out). Patient reports making many changes post heart event. Discussed heart healthy eating and pulmonary MNT.      Intervention Plan   Intervention Prescribe, educate and counsel regarding individualized specific dietary modifications aiming towards targeted core components such as weight, hypertension, lipid management, diabetes, heart failure and other comorbidities.;Nutrition handout(s) given to patient.    Expected Outcomes Short Term Goal: Understand basic principles of dietary content, such as calories, fat, sodium, cholesterol and nutrients.;Short Term Goal: A plan has been developed with  personal nutrition goals set during dietitian appointment.;Long Term Goal: Adherence to prescribed nutrition plan.             Nutrition Assessments:  MEDIFICTS Score Key: ?70 Need to make dietary changes  40-70 Heart Healthy Diet ? 40 Therapeutic Level Cholesterol Diet  Flowsheet Row Pulmonary Rehab from 04/05/2021 in [facility] Cardiac and Pulmonary Rehab  Picture Your Plate Total Score on Admission 58      Picture Your Plate Scores: <33 Unhealthy dietary pattern with much room for improvement. 41-50 Dietary pattern  unlikely to meet recommendations for good health and room for improvement. 51-60 More healthful dietary pattern, with some room for improvement.  >60 Healthy dietary pattern, although there may be some specific behaviors that could be improved.   Nutrition Goals Re-Evaluation:  Nutrition Goals Re-Evaluation     [place] Name 05/10/21 1003             Goals   Comment Bill deferred RD appointment last month but has scheduled time for next week.                Nutrition Goals Discharge (Final Nutrition Goals Re-Evaluation):  Nutrition Goals Re-Evaluation - 05/10/21 1003       Goals   Comment Bill deferred RD appointment last month but has scheduled time for next week.             Psychosocial: Target Goals: Acknowledge presence or absence of significant depression and/or stress, maximize coping skills, provide positive support system. Participant is able to verbalize types and ability to use techniques and skills needed for reducing stress and depression.   Education: Stress, Anxiety, and Depression - Group verbal and visual presentation to define topics covered.  Reviews how body is impacted by stress, anxiety, and depression.  Also discusses healthy ways to reduce stress and to treat/manage anxiety and depression.  Written material given at graduation.   Education: Sleep Hygiene -Provides group verbal and written instruction about how sleep can affect  your health.  Define sleep hygiene, discuss sleep cycles and impact of sleep habits. Review good sleep hygiene tips.    Initial Review & Psychosocial Screening:  Initial Psych Review & Screening - 03/26/21 1404       Initial Review   Current issues with Current Psychotropic Meds;Current Anxiety/Panic      Family Dynamics   Good Support System? Yes    Comments His anxiety stems from COVID and his shortness of breath. He can look to his wife and nephew for support.      Barriers   Psychosocial barriers to participate in program The patient should benefit from training in stress management and relaxation.      Screening Interventions   Interventions Encouraged to exercise;To provide support and resources with identified psychosocial needs;Provide feedback about the scores to participant    Expected Outcomes Short Term goal: Utilizing psychosocial counselor, staff and physician to assist with identification of specific Stressors or current issues interfering with healing process. Setting desired goal for each stressor or current issue identified.;Long Term Goal: Stressors or current issues are controlled or eliminated.;Short Term goal: Identification and review with participant of any Quality of Life or Depression concerns found by scoring the questionnaire.;Long Term goal: The participant improves quality of Life and PHQ9 Scores as seen by post scores and/or verbalization of changes             Quality of Life Scores:  Scores of 19 and below usually indicate a poorer quality of life in these areas.  A difference of  2-3 points is a clinically meaningful difference.  A difference of 2-3 points in the total score of the Quality of Life Index has been associated with significant improvement in overall quality of life, self-image, physical symptoms, and general health in studies assessing change in quality of life.  PHQ-9: Recent Review Flowsheet Data     Depression screen [facility] 2/9  04/05/2021   Decreased Interest 0   Down, Depressed, Hopeless 0   PHQ - 2 Score 0  Altered sleeping 1   Tired, decreased energy 2   Change in appetite 1   Feeling bad or failure about yourself  0   Trouble concentrating 0   Moving slowly or fidgety/restless 0   Suicidal thoughts 0   PHQ-9 Score 4   Difficult doing work/chores Not difficult at all      Interpretation of Total Score  Total Score Depression Severity:  1-4 = Minimal depression, 5-9 = Mild depression, 10-14 = Moderate depression, 15-19 = Moderately severe depression, 20-27 = Severe depression   Psychosocial Evaluation and Intervention:   Psychosocial Re-Evaluation:  Psychosocial Re-Evaluation     [place] Name 05/10/21 1001             Psychosocial Re-Evaluation   Comments Bill reports no stress anxiety or depression.  He sleeps 7-8 hours and sleeps well.       Expected Outcomes Short: notify staff of any changes in symptoms Long:  maintain positive outlook                Psychosocial Discharge (Final Psychosocial Re-Evaluation):  Psychosocial Re-Evaluation - 05/10/21 1001       Psychosocial Re-Evaluation   Comments Bill reports no stress anxiety or depression.  He sleeps 7-8 hours and sleeps well.    Expected Outcomes Short: notify staff of any changes in symptoms Long:  maintain positive outlook             Education: Education Goals: Education classes will be provided on a weekly basis, covering required topics. Participant will state understanding/return demonstration of topics presented.  Learning Barriers/Preferences:  Learning Barriers/Preferences - 03/26/21 1403       Learning Barriers/Preferences   Learning Barriers None    Learning Preferences None             General Pulmonary Education Topics:  Infection Prevention: - Provides verbal and written material to individual with discussion of infection control including proper hand washing and proper equipment cleaning during  exercise session. Flowsheet Row Pulmonary Rehab from 05/10/2021 in [facility] Cardiac and Pulmonary Rehab  Date 03/26/21  Educator [facility]  Instruction Review Code 1- Verbalizes Understanding       Falls Prevention: - Provides verbal and written material to individual with discussion of falls prevention and safety. Flowsheet Row Pulmonary Rehab from 05/10/2021 in [facility] Cardiac and Pulmonary Rehab  Date 03/26/21  Educator [facility]  Instruction Review Code 1- Verbalizes Understanding       Chronic Lung Disease Review: - Group verbal instruction with posters, models, PowerPoint presentations and videos,  to review new updates, new respiratory medications, new advancements in procedures and treatments. Providing information on websites and "800" numbers for continued self-education. Includes information about supplement oxygen, available portable oxygen systems, continuous and intermittent flow rates, oxygen safety, concentrators, and Medicare reimbursement for oxygen. Explanation of Pulmonary Drugs, including class, frequency, complications, importance of spacers, rinsing mouth after steroid MDI's, and proper cleaning methods for nebulizers. Review of basic lung anatomy and physiology related to function, structure, and complications of lung disease. Review of risk factors. Discussion about methods for diagnosing sleep apnea and types of masks and machines for OSA. Includes a review of the use of types of environmental controls: home humidity, furnaces, filters, dust mite/pet prevention, HEPA vacuums. Discussion about weather changes, air quality and the benefits of nasal washing. Instruction on Warning signs, infection symptoms, calling MD promptly, preventive modes, and value of vaccinations. Review of effective airway clearance, coughing and/or vibration techniques. Emphasizing that all should  Create an Sports administrator. Written material given at graduation. Flowsheet Row Pulmonary Rehab from 05/10/2021 in [facility] Cardiac and  Pulmonary Rehab  Date 05/10/21  Educator [facility]  Instruction Review Code 1- Verbalizes Understanding       AED/CPR: - Group verbal and written instruction with the use of models to demonstrate the basic use of the AED with the basic ABC's of resuscitation.    Anatomy and Cardiac Procedures: - Group verbal and visual presentation and models provide information about basic cardiac anatomy and function. Reviews the testing methods done to diagnose heart disease and the outcomes of the test results. Describes the treatment choices: Medical Management, Angioplasty, or Coronary Bypass Surgery for treating various heart conditions including Myocardial Infarction, Angina, Valve Disease, and Cardiac Arrhythmias.  Written material given at graduation.   Medication Safety: - Group verbal and visual instruction to review commonly prescribed medications for heart and lung disease. Reviews the medication, class of the drug, and side effects. Includes the steps to properly store meds and maintain the prescription regimen.  Written material given at graduation. Flowsheet Row Pulmonary Rehab from 05/10/2021 in [facility] Cardiac and Pulmonary Rehab  Date 04/26/21  Educator SB  Instruction Review Code 1- Verbalizes Understanding       Other: -Provides group and verbal instruction on various topics (see comments)   Knowledge Questionnaire Score:  Knowledge Questionnaire Score - 04/05/21 0947       Knowledge Questionnaire Score   Pre Score 16/18: Oxygen sats, Oxygen usage              Core Components/Risk Factors/Patient Goals at Admission:  Personal Goals and Risk Factors at Admission - 04/05/21 1000       Core Components/Risk Factors/Patient Goals on Admission    Weight Management Yes;Weight Loss    Intervention Weight Management: Develop a combined nutrition and exercise program designed to reach desired caloric intake, while maintaining appropriate intake of nutrient and fiber, sodium and fats,  and appropriate energy expenditure required for the weight goal.;Weight Management: Provide education and appropriate resources to help participant work on and attain dietary goals.;Weight Management/Obesity: Establish reasonable short term and long term weight goals.    Admit Weight 208 lb (94.3 kg)    Goal Weight: Short Term 203 lb (92.1 kg)    Goal Weight: Long Term 195 lb (88.5 kg)    Expected Outcomes Short Term: Continue to assess and modify interventions until short term weight is achieved;Weight Loss: Understanding of general recommendations for a balanced deficit meal plan, which promotes 1-2 lb weight loss per week and includes a negative energy balance of [phone] kcal/d;Long Term: Adherence to nutrition and physical activity/exercise program aimed toward attainment of established weight goal;Understanding recommendations for meals to include 15-35% energy as protein, 25-35% energy from fat, 35-60% energy from carbohydrates, less than 200mg  of dietary cholesterol, 20-35 gm of total fiber daily;Understanding of distribution of calorie intake throughout the day with the consumption of 4-5 meals/snacks    Improve shortness of breath with ADL's Yes    Intervention Provide education, individualized exercise plan and daily activity instruction to help decrease symptoms of SOB with activities of daily living.    Expected Outcomes Short Term: Improve cardiorespiratory fitness to achieve a reduction of symptoms when performing ADLs;Long Term: Be able to perform more ADLs without symptoms or delay the onset of symptoms    Hypertension Yes    Intervention Provide education on lifestyle modifcations including regular physical activity/exercise, weight management, moderate sodium restriction and increased  consumption of fresh fruit, vegetables, and low fat dairy, alcohol moderation, and smoking cessation.;Monitor prescription use compliance.    Expected Outcomes Short Term: Continued assessment and  intervention until BP is < 140/98mm HG in hypertensive participants. < 130/27mm HG in hypertensive participants with diabetes, heart failure or chronic kidney disease.;Long Term: Maintenance of blood pressure at goal levels.    Lipids Yes    Intervention Provide education and support for participant on nutrition & aerobic/resistive exercise along with prescribed medications to achieve LDL 70mg , HDL >40mg .    Expected Outcomes Short Term: Participant states understanding of desired cholesterol values and is compliant with medications prescribed. Participant is following exercise prescription and nutrition guidelines.;Long Term: Cholesterol controlled with medications as prescribed, with individualized exercise RX and with personalized nutrition plan. Value goals: LDL < 70mg , HDL > 40 mg.             Education:Diabetes - Individual verbal and written instruction to review signs/symptoms of diabetes, desired ranges of glucose level fasting, after meals and with exercise. Acknowledge that pre and post exercise glucose checks will be done for 3 sessions at entry of program.   Know Your Numbers and Heart Failure: - Group verbal and visual instruction to discuss disease risk factors for cardiac and pulmonary disease and treatment options.  Reviews associated critical values for Overweight/Obesity, Hypertension, Cholesterol, and Diabetes.  Discusses basics of heart failure: signs/symptoms and treatments.  Introduces Heart Failure Zone chart for action plan for heart failure.  Written material given at graduation. Flowsheet Row Pulmonary Rehab from 05/10/2021 in [facility] Cardiac and Pulmonary Rehab  Date 05/03/21  Educator SB  Instruction Review Code 1- Verbalizes Understanding       Core Components/Risk Factors/Patient Goals Review:   Goals and Risk Factor Review     Row Name 05/10/21 0957             Core Components/Risk Factors/Patient Goals Review   Personal Goals Review Weight  Management/Obesity;Improve shortness of breath with ADL's;Hypertension       Review [place] feels he is making progress.  He is able to do about 10 min total now.  He is able to do most ADLs without trouble except walking to his car.  He is in his third week of sessions.  He reports taking all medications as directed.  He does monitor oxygen at home.  He doesnt monitor BP at home.  His weight is down some today. He can tell his pants fit looser.       Expected Outcomes Short:  continue to check oxygen at home and work on walking Long:  manage risk factors                Core Components/Risk Factors/Patient Goals at Discharge (Final Review):   Goals and Risk Factor Review - 05/10/21 0957       Core Components/Risk Factors/Patient Goals Review   Personal Goals Review Weight Management/Obesity;Improve shortness of breath with ADL's;Hypertension    Review [place] feels he is making progress.  He is able to do about 10 min total now.  He is able to do most ADLs without trouble except walking to his car.  He is in his third week of sessions.  He reports taking all medications as directed.  He does monitor oxygen at home.  He doesnt monitor BP at home.  His weight is down some today. He can tell his pants fit looser.    Expected Outcomes Short:  continue to check oxygen at  home and work on walking Long:  manage risk factors             ITP Comments:  ITP Comments     Row Name 03/26/21 1402 04/05/21 0940 04/12/21 1157 04/18/21 1203 04/19/21 1054   ITP Comments Virtual Visit completed. Patient informed on EP and RD appointment and 6 Minute walk test. Patient also informed of patient health questionnaires on My Chart. Patient Verbalizes understanding. Visit diagnosis can be found in [facility] 02/28/2021. Completed 6MWT and gym orientation. Initial ITP created and sent for review to Dr. Ottie Glazier, Medical Director. First full day of exercise!  Patient was oriented to gym and equipment including functions,  settings, policies, and procedures.  Patient's individual exercise prescription and treatment plan were reviewed.  All starting workloads were established based on the results of the 6 minute walk test done at initial orientation visit.  The plan for exercise progression was also introduced and progression will be customized based on patient's performance and goals. 30 Day review completed. Medical Director ITP review done, changes made as directed, and signed approval by Medical Director. Patient oxygen saturations dropped routinely throughout rehab. Patient does not want to use oxygen, per Dr. Sabra Heck, "He may exercise without O2- stop as he needs during exercise, only stop exercise if pulse ox drops less than 83%." Staff has doctor's orders note with chart for documentation.    [place] Name 05/15/21 0902 05/16/21 0829 06/06/21 1401 06/13/21 0802 06/19/21 1422   ITP Comments Completed initial RD consultation 30 Day review completed. Medical Director ITP review done, changes made as directed, and signed approval by Medical Director. --  Spoke with Bills wife - he saw lung Dr today and they want him to wait until hes a little stronger to return.  Staff will take him off schedule for 2 weeks and they will call when he can return. 30 Day review completed. Medical Director ITP review done, changes made as directed, and signed approval by Medical Director. Bills wife called and siad he is waiting until he sees Dr Sabra Heck next month to come back to [place].  He is on medical hold until we recieve clearance.    [place] Name 07/11/21 1435 07/30/21 1529         ITP Comments 30 day review completed. ITP sent to Dr. Zetta Bills, Medical Director of  Pulmonary Rehab. Continue with ITP unless changes are made by physician. Called to check on patient.  He does not wish to return to program at this time.  He has not attended since 05/15/2021.  We will discharge him at this time.               Comments: Discharge ITP

## 2021-07-30 NOTE — Progress Notes (Signed)
Discharge Progress Report  Patient Details  Name: Bradley Bowers MRN: [id number] Date of Birth: 01/22/1946 Referring Provider:   [facility] from 04/05/2021 in [facility]  Referring Provider Emily Filbert MD        Number of Visits: 9  Reason for Discharge:  Early Exit:  Personal and Lack of attendance  Smoking History:  Social History   Tobacco Use  Smoking Status Former   Packs/day: 1.00   Years: 30.00   Pack years: 30.00   Types: Cigarettes   Quit date: 03/07/1994   Years since quitting: 27.4  Smokeless Tobacco Never    Diagnosis:  Chronic obstructive pulmonary disease, unspecified COPD type ([place])  ADL UCSD:  Pulmonary Assessment Scores     Row Name 04/05/21 0945         ADL UCSD   ADL Phase Entry     SOB Score total 19     Rest 0     Walk 2     Stairs 3     Bath 0     Dress 1     Shop 0       CAT Score   CAT Score 16       mMRC Score   mMRC Score 2              Initial Exercise Prescription:  Initial Exercise Prescription - 04/05/21 0900       Date of Initial Exercise RX and Referring Provider   Date 04/05/21    Referring Provider Emily Filbert MD      Treadmill   MPH 1.7    Grade 0    Minutes 15    METs 2.3      REL-XR   Level 1    Speed 50    Minutes 15    METs 1.1      T5 Nustep   Level 1    SPM 80    Minutes 15    METs 1.1      Prescription Details   Frequency (times per week) 2    Duration Progress to 30 minutes of continuous aerobic without signs/symptoms of physical distress      Intensity   THRR 40-80% of Max Heartrate 109-134    Ratings of Perceived Exertion 11-13    Perceived Dyspnea 0-4      Progression   Progression Continue to progress workloads to maintain intensity without signs/symptoms of physical distress.      Resistance Training   Training Prescription Yes    Weight 3 lb    Reps 10-15             Discharge Exercise Prescription (Final Exercise  Prescription Changes):  Exercise Prescription Changes - 05/23/21 1100       Response to Exercise   Blood Pressure (Admit) 124/76    Blood Pressure (Exercise) 128/70    Blood Pressure (Exit) 136/62    Heart Rate (Admit) 66 bpm    Heart Rate (Exercise) 76 bpm    Heart Rate (Exit) 63 bpm    Oxygen Saturation (Admit) 89 %    Oxygen Saturation (Exercise) 88 %    Oxygen Saturation (Exit) 90 %    Rating of Perceived Exertion (Exercise) 13    Perceived Dyspnea (Exercise) 3    Symptoms SOB    Duration Progress to 30 minutes of  aerobic without signs/symptoms of physical distress    Intensity THRR unchanged  Progression   Progression Continue to progress workloads to maintain intensity without signs/symptoms of physical distress.    Average METs 2.9      Resistance Training   Training Prescription Yes    Weight 3 lb    Reps 10-15      Interval Training   Interval Training No      Treadmill   MPH 0.8    Grade 0    Minutes 15    METs 2.3      REL-XR   Level 1    Minutes 15    METs 3.5             Functional Capacity:  6 Minute Walk     Row Name 04/05/21 0953         6 Minute Walk   Phase Initial     Distance 400 feet     Walk Time 2 minutes  Stopped early due to low O2 sats     # of Rest Breaks 0     MPH 2.27     METS 1.13     RPE 15     Perceived Dyspnea  2     VO2 Peak 3.96     Symptoms Yes (comment)     Comments SOB     Resting HR 84 bpm     Resting BP 132/72     Resting Oxygen Saturation  94 %     Exercise Oxygen Saturation  during 6 min walk 80 %  Stopped early     Max Ex. HR 104 bpm     Max Ex. BP 162/70     2 Minute Post BP 140/70       Interval HR   1 Minute HR 94     2 Minute HR 104     3 Minute HR --  Test stopped early     4 Minute HR --  Test stopped early     5 Minute HR --  Test stopped early     6 Minute HR --  Test stopped early     2 Minute Post HR 88     Interval Heart Rate? Yes       Interval Oxygen   Interval Oxygen?  Yes     Baseline Oxygen Saturation % 94 %     1 Minute Oxygen Saturation % 87 %     1 Minute Liters of Oxygen 0 L  RA     2 Minute Oxygen Saturation % 80 %  Test stopped     2 Minute Liters of Oxygen 0 L     3 Minute Liters of Oxygen 0 L     4 Minute Liters of Oxygen 0 L     5 Minute Liters of Oxygen 0 L     6 Minute Liters of Oxygen 0 L     2 Minute Post Oxygen Saturation % 90 %     2 Minute Post Liters of Oxygen 0 L              Psychological, QOL, Others - Outcomes: PHQ 2/9: Depression screen PHQ 2/9 04/05/2021  Decreased Interest 0  Down, Depressed, Hopeless 0  PHQ - 2 Score 0  Altered sleeping 1  Tired, decreased energy 2  Change in appetite 1  Feeling bad or failure about yourself  0  Trouble concentrating 0  Moving slowly or fidgety/restless 0  Suicidal thoughts 0  PHQ-9 Score 4  Difficult  doing work/chores Not difficult at all      Nutrition & Weight - Outcomes:  Pre Biometrics - 04/05/21 0944       Pre Biometrics   Height 5' 6.75" (1.695 m)    Weight 208 lb (94.3 kg)    BMI (Calculated) 32.84    Single Leg Stand 1.64 seconds              Nutrition:  Nutrition Therapy & Goals - 05/15/21 0914       Nutrition Therapy   Diet Heart healthy, low Na, pulmonary MNT    Drug/Food Interactions Statins/Certain Fruits    Protein (specify units) 110g    Fiber 30 grams    Whole Grain Foods 3 servings    Saturated Fats 12 max. grams    Fruits and Vegetables 8 servings/day    Sodium 1.5 grams      Personal Nutrition Goals   Nutrition Goal ST: add protein to breakfast (nuts/seeds, boiled egg) LT: maintain changes made after heart event, meet protein and calorie needs    Comments He is trying to eat more fruit and vegetables. B: cereal (high in fiber) - 2 % milk L: banana or apple or yogurt - greek (with nuts and fruit) D: poultry, roast beef, vegetables (vegetable oil or chicken broth (low Na)) - does not add salt  S: ice cream sometimes. Drinks: water or  fruit drink or diet sprite (trying to cut these out). Patient reports making many changes post heart event. Discussed heart healthy eating and pulmonary MNT.      Intervention Plan   Intervention Prescribe, educate and counsel regarding individualized specific dietary modifications aiming towards targeted core components such as weight, hypertension, lipid management, diabetes, heart failure and other comorbidities.;Nutrition handout(s) given to patient.    Expected Outcomes Short Term Goal: Understand basic principles of dietary content, such as calories, fat, sodium, cholesterol and nutrients.;Short Term Goal: A plan has been developed with personal nutrition goals set during dietitian appointment.;Long Term Goal: Adherence to prescribed nutrition plan.             Goals reviewed with patient; copy given to patient.

## 2021-08-07 DIAGNOSIS — J449 Chronic obstructive pulmonary disease, unspecified: Secondary | ICD-10-CM | POA: Diagnosis not present

## 2021-08-07 DIAGNOSIS — G4733 Obstructive sleep apnea (adult) (pediatric): Secondary | ICD-10-CM | POA: Diagnosis not present

## 2021-08-24 DIAGNOSIS — Z125 Encounter for screening for malignant neoplasm of prostate: Secondary | ICD-10-CM | POA: Diagnosis not present

## 2021-08-24 DIAGNOSIS — J9610 Chronic respiratory failure, unspecified whether with hypoxia or hypercapnia: Secondary | ICD-10-CM | POA: Diagnosis not present

## 2021-08-24 DIAGNOSIS — I2781 Cor pulmonale (chronic): Secondary | ICD-10-CM | POA: Diagnosis not present

## 2021-08-24 DIAGNOSIS — Z23 Encounter for immunization: Secondary | ICD-10-CM | POA: Diagnosis not present

## 2021-09-06 DIAGNOSIS — G4733 Obstructive sleep apnea (adult) (pediatric): Secondary | ICD-10-CM | POA: Diagnosis not present

## 2021-09-06 DIAGNOSIS — J449 Chronic obstructive pulmonary disease, unspecified: Secondary | ICD-10-CM | POA: Diagnosis not present

## 2021-09-13 DIAGNOSIS — R270 Ataxia, unspecified: Secondary | ICD-10-CM | POA: Diagnosis not present

## 2021-09-25 ENCOUNTER — Ambulatory Visit: Payer: Medicare HMO

## 2021-09-25 DIAGNOSIS — R42 Dizziness and giddiness: Secondary | ICD-10-CM | POA: Diagnosis not present

## 2021-09-25 DIAGNOSIS — R296 Repeated falls: Secondary | ICD-10-CM | POA: Diagnosis not present

## 2021-10-07 DIAGNOSIS — J449 Chronic obstructive pulmonary disease, unspecified: Secondary | ICD-10-CM | POA: Diagnosis not present

## 2021-10-07 DIAGNOSIS — G4733 Obstructive sleep apnea (adult) (pediatric): Secondary | ICD-10-CM | POA: Diagnosis not present

## 2021-10-23 DIAGNOSIS — R601 Generalized edema: Secondary | ICD-10-CM | POA: Diagnosis not present

## 2021-11-07 DIAGNOSIS — J449 Chronic obstructive pulmonary disease, unspecified: Secondary | ICD-10-CM | POA: Diagnosis not present

## 2021-11-07 DIAGNOSIS — G4733 Obstructive sleep apnea (adult) (pediatric): Secondary | ICD-10-CM | POA: Diagnosis not present

## 2021-11-21 DIAGNOSIS — E669 Obesity, unspecified: Secondary | ICD-10-CM | POA: Diagnosis not present

## 2021-11-21 DIAGNOSIS — R55 Syncope and collapse: Secondary | ICD-10-CM | POA: Diagnosis not present

## 2021-11-21 DIAGNOSIS — E668 Other obesity: Secondary | ICD-10-CM | POA: Diagnosis not present

## 2021-11-21 DIAGNOSIS — I252 Old myocardial infarction: Secondary | ICD-10-CM | POA: Diagnosis not present

## 2021-11-21 DIAGNOSIS — G20 Parkinson's disease: Secondary | ICD-10-CM | POA: Diagnosis not present

## 2021-11-21 DIAGNOSIS — J9610 Chronic respiratory failure, unspecified whether with hypoxia or hypercapnia: Secondary | ICD-10-CM | POA: Diagnosis not present

## 2021-11-22 ENCOUNTER — Other Ambulatory Visit: Payer: Self-pay | Admitting: Neurology

## 2021-11-22 DIAGNOSIS — G20 Parkinson's disease: Secondary | ICD-10-CM

## 2021-11-26 ENCOUNTER — Other Ambulatory Visit: Payer: Self-pay | Admitting: Physician Assistant

## 2021-11-26 DIAGNOSIS — J432 Centrilobular emphysema: Secondary | ICD-10-CM | POA: Diagnosis not present

## 2021-11-26 DIAGNOSIS — M25531 Pain in right wrist: Secondary | ICD-10-CM

## 2021-11-26 DIAGNOSIS — Z01818 Encounter for other preprocedural examination: Secondary | ICD-10-CM | POA: Diagnosis not present

## 2021-12-01 DIAGNOSIS — R55 Syncope and collapse: Secondary | ICD-10-CM | POA: Diagnosis not present

## 2021-12-03 ENCOUNTER — Ambulatory Visit
Admission: RE | Admit: 2021-12-03 | Discharge: 2021-12-03 | Disposition: A | Discharge status: Home / Self Care | Payer: Medicare HMO | Source: Ambulatory Visit | Attending: Neurology | Admitting: Neurology

## 2021-12-03 DIAGNOSIS — I2510 Atherosclerotic heart disease of native coronary artery without angina pectoris: Secondary | ICD-10-CM | POA: Diagnosis not present

## 2021-12-03 DIAGNOSIS — J432 Centrilobular emphysema: Secondary | ICD-10-CM | POA: Diagnosis not present

## 2021-12-03 DIAGNOSIS — N182 Chronic kidney disease, stage 2 (mild): Secondary | ICD-10-CM | POA: Diagnosis not present

## 2021-12-03 DIAGNOSIS — R251 Tremor, unspecified: Secondary | ICD-10-CM | POA: Diagnosis not present

## 2021-12-03 DIAGNOSIS — G629 Polyneuropathy, unspecified: Secondary | ICD-10-CM | POA: Diagnosis not present

## 2021-12-03 DIAGNOSIS — G20 Parkinson's disease: Secondary | ICD-10-CM

## 2021-12-03 DIAGNOSIS — I6782 Cerebral ischemia: Secondary | ICD-10-CM | POA: Diagnosis not present

## 2021-12-03 DIAGNOSIS — G4733 Obstructive sleep apnea (adult) (pediatric): Secondary | ICD-10-CM | POA: Diagnosis not present

## 2021-12-03 DIAGNOSIS — J9601 Acute respiratory failure with hypoxia: Secondary | ICD-10-CM | POA: Diagnosis not present

## 2021-12-03 DIAGNOSIS — I614 Nontraumatic intracerebral hemorrhage in cerebellum: Secondary | ICD-10-CM | POA: Diagnosis not present

## 2021-12-03 DIAGNOSIS — G319 Degenerative disease of nervous system, unspecified: Secondary | ICD-10-CM | POA: Diagnosis not present

## 2021-12-05 DIAGNOSIS — J449 Chronic obstructive pulmonary disease, unspecified: Secondary | ICD-10-CM | POA: Diagnosis not present

## 2021-12-05 DIAGNOSIS — G4733 Obstructive sleep apnea (adult) (pediatric): Secondary | ICD-10-CM | POA: Diagnosis not present

## 2021-12-27 DIAGNOSIS — H401131 Primary open-angle glaucoma, bilateral, mild stage: Secondary | ICD-10-CM | POA: Diagnosis not present

## 2022-01-01 DIAGNOSIS — E538 Deficiency of other specified B group vitamins: Secondary | ICD-10-CM | POA: Diagnosis not present

## 2022-01-01 DIAGNOSIS — R739 Hyperglycemia, unspecified: Secondary | ICD-10-CM | POA: Diagnosis not present

## 2022-01-01 DIAGNOSIS — Z125 Encounter for screening for malignant neoplasm of prostate: Secondary | ICD-10-CM | POA: Diagnosis not present

## 2022-01-01 DIAGNOSIS — E782 Mixed hyperlipidemia: Secondary | ICD-10-CM | POA: Diagnosis not present

## 2022-01-03 DIAGNOSIS — H401131 Primary open-angle glaucoma, bilateral, mild stage: Secondary | ICD-10-CM | POA: Diagnosis not present

## 2022-01-05 DIAGNOSIS — J449 Chronic obstructive pulmonary disease, unspecified: Secondary | ICD-10-CM | POA: Diagnosis not present

## 2022-01-05 DIAGNOSIS — G4733 Obstructive sleep apnea (adult) (pediatric): Secondary | ICD-10-CM | POA: Diagnosis not present

## 2022-01-08 DIAGNOSIS — J449 Chronic obstructive pulmonary disease, unspecified: Secondary | ICD-10-CM | POA: Diagnosis not present

## 2022-01-08 DIAGNOSIS — Z0000 Encounter for general adult medical examination without abnormal findings: Secondary | ICD-10-CM | POA: Diagnosis not present

## 2022-01-08 DIAGNOSIS — F03A0 Unspecified dementia, mild, without behavioral disturbance, psychotic disturbance, mood disturbance, and anxiety: Secondary | ICD-10-CM | POA: Diagnosis not present

## 2022-01-08 DIAGNOSIS — F319 Bipolar disorder, unspecified: Secondary | ICD-10-CM | POA: Diagnosis not present

## 2022-01-24 DIAGNOSIS — Z1331 Encounter for screening for depression: Secondary | ICD-10-CM | POA: Diagnosis not present

## 2022-01-24 DIAGNOSIS — Z6834 Body mass index (BMI) 34.0-34.9, adult: Secondary | ICD-10-CM | POA: Diagnosis not present

## 2022-01-24 DIAGNOSIS — J449 Chronic obstructive pulmonary disease, unspecified: Secondary | ICD-10-CM | POA: Diagnosis not present

## 2022-01-29 ENCOUNTER — Encounter: Payer: Self-pay | Admitting: Radiology

## 2022-01-29 ENCOUNTER — Other Ambulatory Visit: Payer: Self-pay

## 2022-01-29 ENCOUNTER — Observation Stay
Admission: EM | Admit: 2022-01-29 | Discharge: 2022-02-04 | Disposition: A | Discharge status: Skilled Nursing Facility | Payer: Medicare HMO | Attending: Student in an Organized Health Care Education/Training Program | Admitting: Student in an Organized Health Care Education/Training Program

## 2022-01-29 ENCOUNTER — Observation Stay: Payer: Medicare HMO

## 2022-01-29 ENCOUNTER — Emergency Department: Payer: Medicare HMO

## 2022-01-29 DIAGNOSIS — J45909 Unspecified asthma, uncomplicated: Secondary | ICD-10-CM | POA: Insufficient documentation

## 2022-01-29 DIAGNOSIS — F319 Bipolar disorder, unspecified: Secondary | ICD-10-CM | POA: Diagnosis not present

## 2022-01-29 DIAGNOSIS — Z20822 Contact with and (suspected) exposure to covid-19: Secondary | ICD-10-CM | POA: Insufficient documentation

## 2022-01-29 DIAGNOSIS — G459 Transient cerebral ischemic attack, unspecified: Principal | ICD-10-CM | POA: Diagnosis present

## 2022-01-29 DIAGNOSIS — Z87891 Personal history of nicotine dependence: Secondary | ICD-10-CM | POA: Diagnosis not present

## 2022-01-29 DIAGNOSIS — G629 Polyneuropathy, unspecified: Secondary | ICD-10-CM | POA: Diagnosis not present

## 2022-01-29 DIAGNOSIS — R29818 Other symptoms and signs involving the nervous system: Secondary | ICD-10-CM

## 2022-01-29 DIAGNOSIS — R4781 Slurred speech: Secondary | ICD-10-CM

## 2022-01-29 DIAGNOSIS — I639 Cerebral infarction, unspecified: Secondary | ICD-10-CM | POA: Diagnosis present

## 2022-01-29 DIAGNOSIS — M47812 Spondylosis without myelopathy or radiculopathy, cervical region: Secondary | ICD-10-CM | POA: Diagnosis not present

## 2022-01-29 DIAGNOSIS — R531 Weakness: Secondary | ICD-10-CM | POA: Diagnosis not present

## 2022-01-29 DIAGNOSIS — I10 Essential (primary) hypertension: Secondary | ICD-10-CM

## 2022-01-29 DIAGNOSIS — Q283 Other malformations of cerebral vessels: Secondary | ICD-10-CM | POA: Diagnosis not present

## 2022-01-29 DIAGNOSIS — R600 Localized edema: Secondary | ICD-10-CM

## 2022-01-29 DIAGNOSIS — J449 Chronic obstructive pulmonary disease, unspecified: Secondary | ICD-10-CM

## 2022-01-29 DIAGNOSIS — Z79899 Other long term (current) drug therapy: Secondary | ICD-10-CM | POA: Diagnosis not present

## 2022-01-29 DIAGNOSIS — R259 Unspecified abnormal involuntary movements: Secondary | ICD-10-CM

## 2022-01-29 DIAGNOSIS — H538 Other visual disturbances: Secondary | ICD-10-CM | POA: Diagnosis not present

## 2022-01-29 DIAGNOSIS — Z7982 Long term (current) use of aspirin: Secondary | ICD-10-CM | POA: Diagnosis not present

## 2022-01-29 DIAGNOSIS — I2510 Atherosclerotic heart disease of native coronary artery without angina pectoris: Secondary | ICD-10-CM | POA: Diagnosis not present

## 2022-01-29 DIAGNOSIS — R269 Unspecified abnormalities of gait and mobility: Secondary | ICD-10-CM | POA: Diagnosis not present

## 2022-01-29 DIAGNOSIS — R262 Difficulty in walking, not elsewhere classified: Secondary | ICD-10-CM | POA: Diagnosis not present

## 2022-01-29 LAB — COMPREHENSIVE METABOLIC PANEL
ALT: 19 U/L (ref 0–44)
AST: 19 U/L (ref 15–41)
Albumin: 3.4 g/dL — ABNORMAL LOW (ref 3.5–5.0)
Alkaline Phosphatase: 84 U/L (ref 38–126)
Anion gap: 7 (ref 5–15)
BUN: 20 mg/dL (ref 8–23)
CO2: 30 mmol/L (ref 22–32)
Calcium: 8.4 mg/dL — ABNORMAL LOW (ref 8.9–10.3)
Chloride: 102 mmol/L (ref 98–111)
Creatinine, Ser: 1.14 mg/dL (ref 0.61–1.24)
GFR, Estimated: >60 mL/min (ref >60)
Glucose, Bld: 95 mg/dL (ref 70–99)
Potassium: 4.4 mmol/L (ref 3.5–5.1)
Sodium: 139 mmol/L (ref 135–145)
Total Bilirubin: 0.5 mg/dL (ref 0.3–1.2)
Total Protein: 7.3 g/dL (ref 6.5–8.1)

## 2022-01-29 LAB — URINALYSIS, COMPLETE (UACMP) WITH MICROSCOPIC
Bacteria, UA: NONE SEEN
Bilirubin Urine: NEGATIVE
Glucose, UA: NEGATIVE mg/dL
Hgb urine dipstick: NEGATIVE
Ketones, ur: NEGATIVE mg/dL
Nitrite: NEGATIVE
Protein, ur: NEGATIVE mg/dL
Specific Gravity, Urine: 1.039 — ABNORMAL HIGH (ref 1.005–1.030)
pH: 5 (ref 5.0–8.0)

## 2022-01-29 LAB — CBC WITH DIFFERENTIAL/PLATELET
Abs Immature Granulocytes: 0.03 10*3/uL (ref 0.00–0.07)
Basophils Absolute: 0 10*3/uL (ref 0.0–0.1)
Basophils Relative: 0 %
Eosinophils Absolute: 0.4 10*3/uL (ref 0.0–0.5)
Eosinophils Relative: 4 %
HCT: 45.4 % (ref 39.0–52.0)
Hemoglobin: 14.5 g/dL (ref 13.0–17.0)
Immature Granulocytes: 0 %
Lymphocytes Relative: 22 %
Lymphs Abs: 2 10*3/uL (ref 0.7–4.0)
MCH: 29.4 pg (ref 26.0–34.0)
MCHC: 31.9 g/dL (ref 30.0–36.0)
MCV: 91.9 fL (ref 80.0–100.0)
Monocytes Absolute: 0.7 10*3/uL (ref 0.1–1.0)
Monocytes Relative: 7 %
Neutro Abs: 5.9 10*3/uL (ref 1.7–7.7)
Neutrophils Relative %: 67 %
Platelets: 144 10*3/uL — ABNORMAL LOW (ref 150–400)
RBC: 4.94 MIL/uL (ref 4.22–5.81)
RDW: 13.8 % (ref 11.5–15.5)
WBC: 9 10*3/uL (ref 4.0–10.5)
nRBC: 0 % (ref 0.0–0.2)

## 2022-01-29 LAB — URINE DRUG SCREEN, QUALITATIVE (ARMC ONLY)
Amphetamines, Ur Screen: NOT DETECTED
Barbiturates, Ur Screen: NOT DETECTED
Benzodiazepine, Ur Scrn: NOT DETECTED
Cannabinoid 50 Ng, Ur ~~LOC~~: NOT DETECTED
Cocaine Metabolite,Ur ~~LOC~~: NOT DETECTED
MDMA (Ecstasy)Ur Screen: NOT DETECTED
Methadone Scn, Ur: NOT DETECTED
Opiate, Ur Screen: NOT DETECTED
Phencyclidine (PCP) Ur S: NOT DETECTED
Tricyclic, Ur Screen: POSITIVE — AB

## 2022-01-29 LAB — RESP PANEL BY RT-PCR (FLU A&B, COVID) ARPGX2
Influenza A by PCR: NEGATIVE
Influenza B by PCR: NEGATIVE
SARS Coronavirus 2 by RT PCR: NEGATIVE

## 2022-01-29 LAB — PROTIME-INR
INR: 1 (ref 0.8–1.2)
Prothrombin Time: 13.3 seconds (ref 11.4–15.2)

## 2022-01-29 LAB — APTT: aPTT: 29 seconds (ref 24–36)

## 2022-01-29 LAB — CBG MONITORING, ED: Glucose-Capillary: 97 mg/dL (ref 70–99)

## 2022-01-29 LAB — ETHANOL: Alcohol, Ethyl (B): <10 mg/dL (ref <10)

## 2022-01-29 MED ORDER — PANTOPRAZOLE SODIUM 40 MG PO TBEC
40.0000 mg | DELAYED_RELEASE_TABLET | Freq: Two times a day (BID) | ORAL | Status: DC
  Administered 2022-01-30 (×2): 40 mg via ORAL
  Filled 2022-01-29 (×2): qty 1

## 2022-01-29 MED ORDER — DIVALPROEX SODIUM ER 500 MG PO TB24
1500.0000 mg | ORAL_TABLET | Freq: Every day | ORAL | Status: DC
  Administered 2022-01-30 – 2022-02-03 (×6): 1500 mg via ORAL
  Filled 2022-01-29 (×3): qty 3
  Filled 2022-01-29 (×2): qty 6
  Filled 2022-01-29: qty 3

## 2022-01-29 MED ORDER — IOHEXOL 350 MG/ML SOLN
75.0000 mL | Freq: Once | INTRAVENOUS | Status: AC | PRN
  Administered 2022-01-29: 75 mL via INTRAVENOUS

## 2022-01-29 MED ORDER — SENNOSIDES-DOCUSATE SODIUM 8.6-50 MG PO TABS: 1.0000 | ORAL_TABLET | Freq: Every evening | ORAL | Status: DC | PRN

## 2022-01-29 MED ORDER — HYDROCHLOROTHIAZIDE 25 MG PO TABS
25.0000 mg | ORAL_TABLET | Freq: Every day | ORAL | Status: DC
  Administered 2022-01-30 – 2022-02-01 (×3): 25 mg via ORAL
  Filled 2022-01-29 (×3): qty 1

## 2022-01-29 MED ORDER — ATORVASTATIN CALCIUM 20 MG PO TABS
40.0000 mg | ORAL_TABLET | Freq: Every day | ORAL | Status: DC
  Administered 2022-01-30 – 2022-01-31 (×3): 40 mg via ORAL
  Filled 2022-01-29 (×3): qty 2

## 2022-01-29 MED ORDER — ACETAMINOPHEN 160 MG/5ML PO SOLN
650.0000 mg | ORAL | Status: DC | PRN
  Filled 2022-01-29: qty 20.3

## 2022-01-29 MED ORDER — OLANZAPINE 5 MG PO TABS
5.0000 mg | ORAL_TABLET | Freq: Every day | ORAL | Status: DC
  Administered 2022-01-30 – 2022-02-03 (×6): 5 mg via ORAL
  Filled 2022-01-29 (×6): qty 1

## 2022-01-29 MED ORDER — LISINOPRIL 5 MG PO TABS
5.0000 mg | ORAL_TABLET | Freq: Every day | ORAL | Status: DC
  Administered 2022-01-30 – 2022-02-04 (×6): 5 mg via ORAL
  Filled 2022-01-29 (×6): qty 1

## 2022-01-29 MED ORDER — ASPIRIN EC 81 MG PO TBEC
81.0000 mg | DELAYED_RELEASE_TABLET | Freq: Every day | ORAL | Status: DC
  Administered 2022-01-30 – 2022-02-04 (×6): 81 mg via ORAL
  Filled 2022-01-29 (×6): qty 1

## 2022-01-29 MED ORDER — SODIUM CHLORIDE 0.9 % IV SOLN: INTRAVENOUS | Status: DC

## 2022-01-29 MED ORDER — ATENOLOL 50 MG PO TABS
50.0000 mg | ORAL_TABLET | Freq: Every day | ORAL | Status: DC
  Administered 2022-01-30 – 2022-02-01 (×2): 50 mg via ORAL
  Filled 2022-01-29: qty 2
  Filled 2022-01-29 (×2): qty 1

## 2022-01-29 MED ORDER — CLOPIDOGREL BISULFATE 75 MG PO TABS
75.0000 mg | ORAL_TABLET | Freq: Every day | ORAL | Status: DC
  Administered 2022-01-30 – 2022-02-04 (×6): 75 mg via ORAL
  Filled 2022-01-29 (×6): qty 1

## 2022-01-29 MED ORDER — DONEPEZIL HCL 5 MG PO TABS
5.0000 mg | ORAL_TABLET | Freq: Every day | ORAL | Status: DC
  Administered 2022-01-29 – 2022-02-03 (×6): 5 mg via ORAL
  Filled 2022-01-29 (×6): qty 1

## 2022-01-29 MED ORDER — STROKE: EARLY STAGES OF RECOVERY BOOK: Freq: Once | Status: DC

## 2022-01-29 MED ORDER — FLUTICASONE PROPIONATE 50 MCG/ACT NA SUSP: 1.0000 | Freq: Every day | NASAL | Status: DC | PRN

## 2022-01-29 MED ORDER — ENOXAPARIN SODIUM 60 MG/0.6ML IJ SOSY
0.5000 mg/kg | PREFILLED_SYRINGE | INTRAMUSCULAR | Status: DC
  Administered 2022-01-30 – 2022-02-03 (×6): 45 mg via SUBCUTANEOUS
  Filled 2022-01-29 (×6): qty 0.6

## 2022-01-29 MED ORDER — VITAMIN B-12 1000 MCG PO TABS
1000.0000 ug | ORAL_TABLET | Freq: Every day | ORAL | Status: DC
  Administered 2022-01-30 – 2022-02-04 (×6): 1000 ug via ORAL
  Filled 2022-01-29 (×6): qty 1

## 2022-01-29 MED ORDER — ALBUTEROL SULFATE (2.5 MG/3ML) 0.083% IN NEBU: 2.5000 mg | INHALATION_SOLUTION | RESPIRATORY_TRACT | Status: DC | PRN

## 2022-01-29 MED ORDER — CLOPIDOGREL BISULFATE 75 MG PO TABS
300.0000 mg | ORAL_TABLET | Freq: Once | ORAL | Status: AC
  Administered 2022-01-29: 300 mg via ORAL
  Filled 2022-01-29: qty 4

## 2022-01-29 MED ORDER — CARBAMAZEPINE 200 MG PO TABS
200.0000 mg | ORAL_TABLET | Freq: Two times a day (BID) | ORAL | Status: DC
  Administered 2022-01-30 – 2022-02-04 (×12): 200 mg via ORAL
  Filled 2022-01-29 (×12): qty 1

## 2022-01-29 MED ORDER — ACETAMINOPHEN 325 MG PO TABS: 650.0000 mg | ORAL_TABLET | ORAL | Status: DC | PRN

## 2022-01-29 MED ORDER — ACETAMINOPHEN 650 MG RE SUPP: 650.0000 mg | RECTAL | Status: DC | PRN

## 2022-01-29 NOTE — Progress Notes (Signed)
2101-Code stroke activated ?2106-pt taken to CT ?2120-patient back to ED from CT ?2124-Dr Lue joined teleneuro cart ?

## 2022-01-29 NOTE — ED Notes (Signed)
Patient taken and returned from CT with RN at this time ?

## 2022-01-29 NOTE — Progress Notes (Signed)
Anticoagulation monitoring(Lovenox): ? ?76 yo male ordered Lovenox 40 mg Q24h ?   ?Filed Weights  ? 01/29/22 2006  ?Weight: 91.6 kg (202 lb)  ? ?BMI  32.6  ? ?Lab Results  ?Component Value Date  ? CREATININE 1.14 01/29/2022  ? CREATININE 1.27 (H) 05/25/2021  ? CREATININE 1.28 (H) 05/24/2021  ? ?Estimated Creatinine Clearance: 61.1 mL/min (by C-G formula based on SCr of 1.14 mg/dL). ?Hemoglobin & Hematocrit  ?   ?Component Value Date/Time  ? HGB 14.5 01/29/2022 2102  ? HCT 45.4 01/29/2022 2102  ? ? ? ?Per Protocol for Patient with estCrcl >30 ml/min and BMI > 30, will transition to Lovenox 45 mg Q24h.  ?  ? ? ?

## 2022-01-29 NOTE — ED Notes (Signed)
C/o seeing "double" intermittently ?

## 2022-01-29 NOTE — ED Notes (Signed)
Patient reports R sided weakness since yesterday afternoon around 4 PM ?

## 2022-01-29 NOTE — H&P (Signed)
?  ?  ?[place] ? ? ?PATIENT NAME: Bradley Bowers   ? ?MR#:  [id number] ? ?DATE OF BIRTH:  [birth year] ? ?DATE OF ADMISSION:  01/29/2022 ? ?PRIMARY CARE PHYSICIAN: Rusty Aus, MD  ? ?Patient is coming from: Home ? ?REQUESTING/REFERRING PHYSICIAN: Hulan Saas, MD ? ?CHIEF COMPLAINT:  ? ?Chief Complaint  ?Patient presents with  ? Extremity Weakness  ? ? ?HISTORY OF PRESENT ILLNESS:  ?Bradley Bowers is a 76 y.o. male with medical history significant for hypertension, dyslipidemia, CAD, COPD, on home O2 at 2 L/min nightly, PAD and obstructive sleep apnea, who presented to the emergency room with acute onset of bilateral lower extremity weakness last night and today experienced slurred speech.  His wife as well as right upper and lower extremity weakness that started around 5 PM and has been improving.  He was noted to have right upper extremity pronator drift in the ED initially.  He denies any tinnitus or vertigo.  No witnessed seizures.  No urinary or stool incontinence.  He denies any chest pain or palpitations.  No nausea or vomiting or abdominal pain.  No dysuria, oliguria or hematuria or flank pain.  No bleeding diathesis. ? ?ED Course: Recommend ER BP was 173/80 with a heart rate of 58 and pulse extremity was 97% on 2 L O2 by nasal cannula.  Labs revealed of a CMP remarkable for albumin of 3.4 and calcium 8.4 and CBC remarkable for mild thrombocytopenia of 144 with influenza antigens and COVID-19 PCR came back negative.  UA showed specific gravity 1039 and urine drug screen was positive for tricyclic's ?EKG as reviewed by me : EKG showed mild sinus bradycardia with a rate of 58 with borderline left axis deviation. ?Imaging: CT of the head and neck revealed no acute intracranial process and no hemodynamically significant stenosis in the neck and no intracranial large vessel occlusion or significant stenosis. ?Brain MRI without contrast revealed no evidence for acute intracranial process. ? ?Left the  patient was loaded with 300 mg p.o. Plavix and will be continued on his baby aspirin.  He will be admitted to a medical monitored observation bed for further evaluation and management. ?PAST MEDICAL HISTORY:  ? ?Past Medical History:  ?Diagnosis Date  ? Arterial vascular disease 10/24/2014  ? Overview:  MI 1994, small-vessel disease requiring coumadin   ? Asthma   ? Centrilobular emphysema ([place]) 10/24/2014  ? Overview:  2L O2 prn   ? Centrilobular emphysema ([place])   ? Chronic obstructive pulmonary disease ([place]) 11/06/2015  ? Coronary artery disease   ? GERD (gastroesophageal reflux disease)   ? Glaucoma   ? Heart attack ([place]) 2/17, 1994  ? Heart disease   ? Hyperlipemia   ? Hypertension   ? Restless leg 10/24/2014  ? Sleep apnea   ? ? ?PAST SURGICAL HISTORY:  ? ?Past Surgical History:  ?Procedure Laterality Date  ? CATARACT EXTRACTION W/ INTRAOCULAR LENS  IMPLANT, BILATERAL    ? COLONOSCOPY WITH PROPOFOL N/A 02/19/2018  ? Procedure: COLONOSCOPY WITH PROPOFOL;  Surgeon: Lollie Sails, MD;  Location: [facility] ENDOSCOPY;  Service: Endoscopy;  Laterality: N/A;  ? ESOPHAGOGASTRODUODENOSCOPY (EGD) WITH PROPOFOL N/A 02/19/2018  ? Procedure: ESOPHAGOGASTRODUODENOSCOPY (EGD) WITH PROPOFOL;  Surgeon: Lollie Sails, MD;  Location: [facility] ENDOSCOPY;  Service: Endoscopy;  Laterality: N/A;  ? EYE SURGERY    ? ? ?SOCIAL HISTORY:  ? ?Social History  ? ?Tobacco Use  ? Smoking status: Former  ?  Packs/day: 1.00  ?  Years: 30.00  ?  Pack years: 30.00  ?  Types: Cigarettes  ?  Quit date: 03/07/1994  ?  Years since quitting: 27.9  ? Smokeless tobacco: Never  ?Substance Use Topics  ? Alcohol use: No  ?  Alcohol/week: 0.0 standard drinks  ? ? ?FAMILY HISTORY:  ? ?Family History  ?Problem Relation Age of Onset  ? Nephrolithiasis Father   ? Prostate cancer Neg Hx   ? Bladder Cancer Neg Hx   ? Kidney cancer Neg Hx   ? ? ?DRUG ALLERGIES:  ?No Known Allergies ? ?REVIEW OF SYSTEMS:  ? ?ROS ?As per history of present illness. All pertinent systems  were reviewed above. Constitutional, HEENT, cardiovascular, respiratory, GI, GU, musculoskeletal, neuro, psychiatric, endocrine, integumentary and hematologic systems were reviewed and are otherwise negative/unremarkable except for positive findings mentioned above in the HPI. ? ? ?MEDICATIONS AT HOME:  ? ?Prior to Admission medications   ?Medication Sig Start Date End Date Taking? Authorizing Provider  ?ADVAIR DISKUS 100-50 MCG/ACT AEPB Inhale 1 puff into the lungs 2 (two) times daily. 01/10/21  Yes [provider]  ?albuterol (VENTOLIN HFA) 108 (90 Base) MCG/ACT inhaler Inhale 1-2 puffs into the lungs every 4 (four) hours as needed.   Yes [provider]  ?aspirin 81 MG EC tablet Take 81 mg by mouth daily. 11/14/15  Yes [provider]  ?atenolol (TENORMIN) 50 MG tablet Take 50 mg by mouth daily.   Yes [provider]  ?carbamazepine (TEGRETOL) 200 MG tablet Take 1 tablet by mouth 2 (two) times daily. 01/12/21 01/29/22 Yes [provider]  ?divalproex (DEPAKOTE ER) 500 MG 24 hr tablet Take 3 tablets (1,500 mg total) by mouth at bedtime. 10/06/16  Yes Pucilowska, Jolanta B, MD  ?donepezil (ARICEPT) 5 MG tablet Take 5 mg by mouth at bedtime. 02/28/21 02/28/22 Yes [provider]  ?fluticasone furoate-vilanterol (BREO ELLIPTA) 100-25 MCG/INH AEPB Inhale 1 puff into the lungs daily. 02/28/21  Yes [provider]  ?hydrochlorothiazide (HYDRODIURIL) 25 MG tablet Take 1 tablet by mouth. 01/24/22 04/24/22 Yes [provider]  ?lisinopril (ZESTRIL) 5 MG tablet Take 1 tablet by mouth daily. 11/06/20  Yes [provider]  ?montelukast (SINGULAIR) 10 MG tablet Take 10 mg by mouth at bedtime.   Yes [provider]  ?OLANZapine (ZYPREXA) 5 MG tablet Take 5 mg by mouth at bedtime. 11/22/20  Yes [provider]  ?pantoprazole (PROTONIX) 40 MG tablet Take 40 mg by mouth 2 (two) times daily. 04/17/21  Yes [provider]  ?timolol  (TIMOPTIC) 0.5 % ophthalmic solution 1 drop 2 (two) times daily.   Yes [provider]  ?tiotropium (SPIRIVA) 18 MCG inhalation capsule Place 18 mcg into inhaler and inhale daily.   Yes [provider]  ?vitamin B-12 1000 MCG tablet Take 1 tablet (1,000 mcg total) by mouth daily. 10/07/16  Yes Pucilowska, Jolanta B, MD  ?atorvastatin (LIPITOR) 80 MG tablet Take 40 mg by mouth daily. ?Patient not taking: Reported on 01/29/2022 11/22/20   [provider]  ?fluticasone (FLONASE) 50 MCG/ACT nasal spray Place 1 spray into both nostrils daily.    [provider]  ?furosemide (LASIX) 20 MG tablet Take 1 tablet (20 mg total) by mouth daily. ?Patient not taking: Reported on 01/29/2022 05/25/21 05/25/22  Gwynne Edinger, MD  ?gabapentin (NEURONTIN) 300 MG capsule Take 300 mg by mouth at bedtime. ?Patient not taking: Reported on 01/29/2022 12/06/09   [provider]  ?predniSONE (DELTASONE) 20 MG tablet  Take 2 tablets (40 mg total) by mouth daily with breakfast. ?Patient not taking: Reported on 01/29/2022 05/26/21   Gwynne Edinger, MD  ? ?  ? ?VITAL SIGNS:  ?Blood pressure (!) 110/52, pulse (!) 44, temperature 98.4 ?F (36.9 ?C), temperature source Oral, resp. rate 16, height '5\' 6"'$  (1.676 m), weight 91.6 kg, SpO2 100 %. ? ?PHYSICAL EXAMINATION:  ?Physical Exam ? ?GENERAL:  76 y.o.-year-old Caucasian patient lying in the bed with no acute distress.  ?EYES: Pupils equal, round, reactive to light and accommodation. No scleral icterus. Extraocular muscles intact.  ?HEENT: Head atraumatic, normocephalic. Oropharynx and nasopharynx clear.  ?NECK:  Supple, no jugular venous distention. No thyroid enlargement, no tenderness.  ?LUNGS: Normal breath sounds bilaterally, no wheezing, rales,rhonchi or crepitation. No use of accessory muscles of respiration.  ?CARDIOVASCULAR: Regular rate and rhythm, S1, S2 normal. No murmurs, rubs, or gallops.  ?ABDOMEN: Soft, nondistended, nontender. Bowel sounds  present. No organomegaly or mass.  ?EXTREMITIES: No pedal edema, cyanosis, or clubbing.  ?NEUROLOGIC: Cranial nerves II through XII are intact. Muscle strength 5/5 in all extremities. Sensation intact. Gait not checke

## 2022-01-29 NOTE — ED Notes (Signed)
Paged code stroke to [email or web address] ?

## 2022-01-29 NOTE — ED Notes (Signed)
Pt returned from mri.  Pt alert.  Iv in place.   ?

## 2022-01-29 NOTE — Consult Note (Signed)
[facility] ?[facility] Tele[facility]urology Consult Services ? ? ?Patient Name:   Bradley Bowers, Bradley Bowers ?Date of Birth:   1946-01-05 ?Identification Number:   MRN - [id number] ?Date of Service:   01/29/2022 21:20:59 ? ?Diagnosis: ?      I63.9 - Cerebrovascular accident (CVA), unspecified mechanism ([place]) ? ?Impression: ?     76 y/o man with h/o CAD, COPD, seizure, HTN who presents to the ED with right leg weakness and gait difficulty since yesterday at 1600. Symptoms have progressed. Admit for stroke work-up. ? ?Our recommendations are outlined below. ? ?Recommendations: ? ?      Stroke/Telemetry Floor ?      Neuro Checks ?      Bedside Swallow Eval ?      DVT Prophylaxis ?      IV Fluids, Normal Saline ?      Head of Bed 30 Degrees ?      Euglycemia and Avoid Hyperthermia (PRN Acetaminophen) ?      Bolus with Clopidogrel 300 mg bolus x1 and initiate dual antiplatelet therapy with Aspirin 81 mg daily and Clopidogrel 75 mg daily ?      Antihypertensives PRN if Blood pressure is greater than 220/120 or there is a concern for End organ damage/contraindications for permissive HTN. If blood pressure is greater than 220/120 give labetalol PO or IV or Vasotec IV with a goal of 15% reduction in BP during the first 24 hours. ? ?Per facility request will defer further work up, management, and referrals to inpatient service, inclusive of inpatient neurology consult. ? ?Sign Out: ?      Discussed with Emergency Department Provider ? ? ? ?------------------------------------------------------------------------------ ? ?Advanced Imaging: ?CTA Head and Neck Completed. ? ?LVO:No ? ?Patient doesn't meet criteria for emergent NIR consideration ? ? ?Metrics: ?Last Known Well: 01/28/2022 16:00:00 ?[facility] Notification Time: 01/29/2022 21:20:59 ?Arrival Time: 01/29/2022 19:57:00 ?Stamp Time: 01/29/2022 21:20:59 ?Initial Response Time: 01/29/2022 21:23:26 ?Symptoms: right leg weakness. ?NIHSS Start Assessment Time: 01/29/2022  21:24:14 ?Patient is not a candidate for Thrombolytic. ?Thrombolytic Medical Decision: 01/29/2022 21:26:20 ?Patient was not deemed candidate for Thrombolytic because of following reasons: ?Last Well Known Above 4.5 Hours. ? ?No Acute Hemorrhage or Acute Core Infarct ? ?Radiologist was not called back for review of advanced imaging. ?Primary Provider Notified of Diagnostic Impression and Management Plan on: 01/29/2022 21:33:50 ? ? ? ?------------------------------------------------------------------------------ ? ?History of Present Illness: ?Patient is a 76 year old Male. ? ?Patient was brought by EMS for symptoms of right leg weakness. ?76 y/o man with h/o CAD, COPD, seizure, HTN who presents to the ED with right leg weakness and gait difficulty since yesterday at 1600. Symptoms have progressed over the last 24 hours and has now involved his right upper extremity which prompted him to seek medical attention. Emergent telestroke consult requested. CT brain, CTA head and CTA neck reviewed and case discussed with ED attending (Dr. Tamala Julian). Patient's spouse at the bedside. All questions answered. ? ? ?Past Medical History: ?     Hypertension ? ?Medications: ? ?No Anticoagulant use  ?Antiplatelet use: Yes ASA ?Reviewed EMR for current medications ? ?Allergies:  ?NKDA ? ?Social History: ?Smoking: No ?Alcohol Use: No ?Drug Use: No ? ?Family History: ? ?There is no family history of premature cerebrovascular disease pertinent to this consultation ? ?ROS : ?14 Points Review of Systems was performed and was negative except mentioned in HPI. ? ?Past Surgical History: ?There Is No Surgical History Contributory To Today?s Visit ? ? ? ?Examination: ?BP(147/81), Pulse(63), Blood  Glucose(97) ?1A: Level of Consciousness - Alert; keenly responsive + 0 ?1B: Ask Month and Age - Both Questions Right + 0 ?1C: Blink Eyes & Squeeze Hands - Performs Both Tasks + 0 ?2: Test Horizontal Extraocular Movements - Normal + 0 ?3: Test Visual Fields  - No Visual Loss + 0 ?4: Test Facial Palsy (Use Grimace if Obtunded) - Normal symmetry + 0 ?5A: Test Left Arm Motor Drift - No Drift for 10 Seconds + 0 ?5B: Test Right Arm Motor Drift - No Drift for 10 Seconds + 0 ?6A: Test Left Leg Motor Drift - No Drift for 5 Seconds + 0 ?6B: Test Right Leg Motor Drift - Drift, but doesn't hit bed + 1 ?7: Test Limb Ataxia (FNF/Heel-Shin) - Ataxia in 1 Limb + 1 ?8: Test Sensation - Normal; No sensory loss + 0 ?9: Test Language/Aphasia - Normal; No aphasia + 0 ?10: Test Dysarthria - Normal + 0 ?11: Test Extinction/Inattention - No abnormality + 0 ? ?NIHSS Score: 2 ? ?NIHSS Free Text : Slower rapid alternating movement in right hand ? ?Pre-Morbid Modified Rankin Scale: ?0 Points = No symptoms at all ? ? ?Patient/Family was informed the Neurology Consult would occur via TeleHealth consult by way of interactive audio and video telecommunications and consented to receiving care in this manner. ? ? ?Patient is being evaluated for possible acute neurologic impairment and high probability of imminent or life-threatening deterioration. I spent total of 17 minutes providing care to this patient, including time for face to face visit via telemedicine, review of medical records, imaging studies and discussion of findings with providers, the patient and/or family. ? ? ?Dr Meryl Crutch ? ? ?[facility] ?[phone] ? ? ?Case [id number] ?

## 2022-01-29 NOTE — ED Notes (Signed)
Pt in mri 

## 2022-01-29 NOTE — ED Notes (Signed)
Patient transported to MRI at this time. 

## 2022-01-29 NOTE — Progress Notes (Signed)
Chaplain provided spiritual and emotional support to patient and his spouse. Prayer of comfort for patient.  ?

## 2022-01-29 NOTE — ED Notes (Signed)
Pt transported by RN to CT at this time.  ?

## 2022-01-29 NOTE — ED Notes (Signed)
Tele-neuro activated at this time with Probation officer and MD in room with pt and family  ?

## 2022-01-29 NOTE — ED Provider Notes (Signed)
? ?[facility] ?Provider Note ? ? ? Event Date/Time  ? First MD Initiated Contact with Patient 01/29/22 2036   ?  (approximate) ? ? ?History  ? ?Extremity Weakness ? ? ?HPI ? ?Bradley Bowers is a 76 y.o. male with a past medical history of asthma, COPD on 2 L nasal cannula chronically, HTN, HDL, CAD, GERD and OSA who presents coming by spouse for assessment of for evaluation of weakness.  Patient states he first noticed weakness yesterday in both legs.  States that he felt weaker over the course of last night until this afternoon when he could no longer hold himself up and slid onto the floor.  He states he felt weak equally in both legs.  States he also noticed around 5 PM he started having weakness in his right arm and his wife states that around this time she noticed his speech seemed slurred.  She is not sure when it got better but she feels that his speech is gotten better since then.  He denies any other weakness or numbness.  No chest pain, cough, shortness of breath, vomiting or any other acute pain.  Patient states he has chronic intermittent diarrhea and has had some last couple days but no GI or urinary symptoms.  Denies any EtOH use or illicit drug use. ? ?  ?Past Medical History:  ?Diagnosis Date  ? Arterial vascular disease 10/24/2014  ? Overview:  MI 1994, small-vessel disease requiring coumadin   ? Asthma   ? Centrilobular emphysema ([place]) 10/24/2014  ? Overview:  2L O2 prn   ? Centrilobular emphysema ([place])   ? Chronic obstructive pulmonary disease ([place]) 11/06/2015  ? Coronary artery disease   ? GERD (gastroesophageal reflux disease)   ? Glaucoma   ? Heart attack ([place]) 2/17, 1994  ? Heart disease   ? Hyperlipemia   ? Hypertension   ? Restless leg 10/24/2014  ? Sleep apnea   ? ? ? ?Physical Exam  ?Triage Vital Signs: ?ED Triage Vitals  ?Enc Vitals Group  ?   BP 01/29/22 2007 (!) 173/80  ?   Pulse Rate 01/29/22 2007 (!) 58  ?   Resp 01/29/22 2007 18  ?   Temp 01/29/22 2007 98.4 ?F  (36.9 ?C)  ?   Temp Source 01/29/22 2007 Oral  ?   SpO2 01/29/22 2001 97 %  ?   Weight 01/29/22 2006 202 lb (91.6 kg)  ?   Height 01/29/22 2006 '5\' 6"'$  (1.676 m)  ?   Head Circumference --   ?   Peak Flow --   ?   Pain Score 01/29/22 2004 0  ?   Pain Loc --   ?   Pain Edu? --   ?   Excl. in [place]? --   ? ? ?Most recent vital signs: ?Vitals:  ? 01/29/22 2130 01/29/22 2230  ?BP: (!) 164/88 (!) 152/80  ?Pulse: 64 66  ?Resp: (!) 21 17  ?Temp:    ?SpO2: 96% 94%  ? ? ?General: Awake, no distress. ?CV:  Good peripheral perfusion.  2+ radial pulses. ?Resp:  Normal effort.  Clear bilaterally. ?Abd:  No distention.  Soft. ?Other:  .  Nerves II 12 are grossly intact.  Subtle right-sided pronator drift.  No finger dysmetria.  Patient seems globally weak in both legs without clear asymmetry.  Sensation and strength intact in upper extremities. ? ? ?ED Results / Procedures / Treatments  ?Labs ?(all labs ordered are listed, but  only abnormal results are displayed) ?Labs Reviewed  ?URINALYSIS, COMPLETE (UACMP) WITH MICROSCOPIC - Abnormal; Notable for the following components:  ?    Result Value  ? Color, Urine YELLOW (*)   ? APPearance CLEAR (*)   ? Specific Gravity, Urine 1.039 (*)   ? Leukocytes,Ua TRACE (*)   ? All other components within normal limits  ?CBC WITH DIFFERENTIAL/PLATELET - Abnormal; Notable for the following components:  ? Platelets 144 (*)   ? All other components within normal limits  ?COMPREHENSIVE METABOLIC PANEL - Abnormal; Notable for the following components:  ? Calcium 8.4 (*)   ? Albumin 3.4 (*)   ? All other components within normal limits  ?URINE DRUG SCREEN, QUALITATIVE ([facility] ONLY) - Abnormal; Notable for the following components:  ? Tricyclic, Ur Screen POSITIVE (*)   ? All other components within normal limits  ?RESP PANEL BY RT-PCR (FLU A&B, COVID) ARPGX2  ?PROTIME-INR  ?APTT  ?ETHANOL  ?LIPID PANEL  ?HEMOGLOBIN A1C  ?CBG MONITORING, ED  ? ? ? ?EKG ? ?ECG is remarkable sinus bradycardia with a ventricular  rate 58, unremarkable intervals without evidence of acute ischemia or significant arrhythmia.  Axis between 0 and -30 degrees. ? ? ?RADIOLOGY ?CTA head and neck reviewed by myself without evidence of intracranial hemorrhage, ischemia, edema mass effect or large vessel occlusion or stenosis.  Last reviewed radiology interpretation and agree with the findings of same. ? ?PROCEDURES: ? ?Critical Care performed: Yes, see critical care procedure note(s) ? ?.1-3 Lead EKG Interpretation ?Performed by: Lucrezia Starch, MD ?Authorized by: Lucrezia Starch, MD  ? ?  Interpretation: normal   ?  ECG rate assessment: normal   ?  Rhythm: sinus rhythm   ?  Ectopy: none   ?  Conduction: normal   ?.Critical Care ?Performed by: Lucrezia Starch, MD ?Authorized by: Lucrezia Starch, MD  ? ?Critical care provider statement:  ?  Critical care time (minutes):  30 ?  Critical care was necessary to treat or prevent imminent or life-threatening deterioration of the following conditions:  CNS failure or compromise ?  Critical care was time spent personally by me on the following activities:  Development of treatment plan with patient or surrogate, discussions with consultants, evaluation of patient's response to treatment, examination of patient, ordering and review of laboratory studies, ordering and review of radiographic studies, ordering and performing treatments and interventions, pulse oximetry, re-evaluation of patient's condition and review of old charts ? ?The patient is on the cardiac monitor to evaluate for evidence of arrhythmia and/or significant heart rate changes. ? ? ?MEDICATIONS ORDERED IN ED: ?Medications  ?aspirin EC tablet 81 mg (has no administration in time range)  ?atenolol (TENORMIN) tablet 50 mg (has no administration in time range)  ?atorvastatin (LIPITOR) tablet 40 mg (has no administration in time range)  ?hydrochlorothiazide (HYDRODIURIL) tablet 25 mg (has no administration in time range)  ?lisinopril (ZESTRIL)  tablet 5 mg (has no administration in time range)  ?donepezil (ARICEPT) tablet 5 mg (has no administration in time range)  ?OLANZapine (ZYPREXA) tablet 5 mg (has no administration in time range)  ?pantoprazole (PROTONIX) EC tablet 40 mg (has no administration in time range)  ?vitamin B-12 (CYANOCOBALAMIN) tablet 1,000 mcg (has no administration in time range)  ?carbamazepine (TEGRETOL) tablet 200 mg (has no administration in time range)  ?divalproex (DEPAKOTE ER) 24 hr tablet 1,500 mg (has no administration in time range)  ?albuterol (PROVENTIL) (2.5 MG/3ML) 0.083% nebulizer solution 2.5 mg (has no  administration in time range)  ?fluticasone (FLONASE) 50 MCG/ACT nasal spray 1 spray (has no administration in time range)  ? stroke: early stages of recovery book (has no administration in time range)  ?0.9 %  sodium chloride infusion (has no administration in time range)  ?acetaminophen (TYLENOL) tablet 650 mg (has no administration in time range)  ?  Or  ?acetaminophen (TYLENOL) 160 MG/5ML solution 650 mg (has no administration in time range)  ?  Or  ?acetaminophen (TYLENOL) suppository 650 mg (has no administration in time range)  ?senna-docusate (Senokot-S) tablet 1 tablet (has no administration in time range)  ?enoxaparin (LOVENOX) injection 45 mg (has no administration in time range)  ?clopidogrel (PLAVIX) tablet 75 mg (has no administration in time range)  ?iohexol (OMNIPAQUE) 350 MG/ML injection 75 mL (75 mLs Intravenous Contrast Given 01/29/22 2111)  ?clopidogrel (PLAVIX) tablet 300 mg (300 mg Oral Given 01/29/22 2221)  ? ? ? ?IMPRESSION / MDM / ASSESSMENT AND PLAN / ED COURSE  ?I reviewed the triage vital signs and the nursing notes. ?             ?               ? ?Differential diagnosis includes, but is not limited to CVA, anemia, metabolic derangements, arrhythmia, spinal lesion and intracranial mass.  Patient reports sudden onset of right-sided arm weakness around 5 PM I did make him a code stroke on  arrival. ? ?CTA head and neck reviewed by myself without evidence of intracranial hemorrhage, ischemia, edema mass effect or large vessel occlusion or stenosis.  Last reviewed radiology interpretation and agr

## 2022-01-29 NOTE — ED Triage Notes (Signed)
Patient coming to ED for evaluation of generalized weakness.  Reports "feeling wobbly yesterday."  Today around 1630, weakness increased and he was unable to stand.  Has had blurred vision.  No reports of pain.  No slurred speech.  No HA ?

## 2022-01-30 ENCOUNTER — Observation Stay
Admit: 2022-01-30 | Discharge: 2022-01-30 | Disposition: A | Discharge status: Home / Self Care | Payer: Medicare HMO | Attending: Family Medicine | Admitting: Family Medicine

## 2022-01-30 DIAGNOSIS — F319 Bipolar disorder, unspecified: Secondary | ICD-10-CM | POA: Diagnosis not present

## 2022-01-30 DIAGNOSIS — G459 Transient cerebral ischemic attack, unspecified: Secondary | ICD-10-CM | POA: Diagnosis not present

## 2022-01-30 DIAGNOSIS — G629 Polyneuropathy, unspecified: Secondary | ICD-10-CM

## 2022-01-30 DIAGNOSIS — I10 Essential (primary) hypertension: Secondary | ICD-10-CM | POA: Diagnosis not present

## 2022-01-30 DIAGNOSIS — J438 Other emphysema: Secondary | ICD-10-CM | POA: Diagnosis not present

## 2022-01-30 LAB — LIPID PANEL
Cholesterol: 174 mg/dL (ref 0–200)
HDL: 43 mg/dL (ref >40)
LDL Cholesterol: 113 mg/dL — ABNORMAL HIGH (ref 0–99)
Total CHOL/HDL Ratio: 4 RATIO
Triglycerides: 91 mg/dL (ref <150)
VLDL: 18 mg/dL (ref 0–40)

## 2022-01-30 LAB — HEMOGLOBIN A1C
Hgb A1c MFr Bld: 5.1 % (ref 4.8–5.6)
Mean Plasma Glucose: 99.67 mg/dL

## 2022-01-30 LAB — ECHOCARDIOGRAM LIMITED BUBBLE STUDY: S' Lateral: 3.75 cm

## 2022-01-30 MED ORDER — ATORVASTATIN CALCIUM 80 MG PO TABS: 80.0000 mg | ORAL_TABLET | Freq: Every day | ORAL | 0 refills | Status: DC

## 2022-01-30 MED ORDER — CLOPIDOGREL BISULFATE 75 MG PO TABS: 75.0000 mg | ORAL_TABLET | Freq: Every day | ORAL | 0 refills | Status: DC

## 2022-01-30 NOTE — Assessment & Plan Note (Signed)
-   The patient will be admitted to a medical telemetry observation bed. ?- I was TIA was manifested by slurred speech and right upper and lower extremity weakness that are resolved. ?- We will continue on aspirin and Plavix. ?- We will continue statin therapy and check fasting lipids. ?- PT/OT and ST consults as well as neurology consults will be obtained. ?- I notified Dr. Quinn Axe about the patient. ?

## 2022-01-30 NOTE — Care Management Obs Status (Signed)
MEDICARE OBSERVATION STATUS NOTIFICATION ? ? ?Patient Details  ?Name: Bradley Bowers ?MRN: [id number] ?Date of Birth: 1945/12/05 ? ? ?Medicare Observation Status Notification Given:  Yes ? ? ? ?Shelbie Hutching, RN ?01/30/2022, 9:56 AM ?

## 2022-01-30 NOTE — Assessment & Plan Note (Signed)
-   We will continue his albuterol while holding off his Advair discus. ?- We will continue Singulair. ?

## 2022-01-30 NOTE — Evaluation (Signed)
Physical Therapy Evaluation ?Patient Details ?Name: Bradley Bowers ?MRN: [id number] ?DOB: 15-Dec-1945 ?Today's Date: 01/30/2022 ? ?History of Present Illness ? Patient is a 76 year old with a PMH (+) for asthma, COPD on 2 L nasal cannula chronically, HTN, HDL, CAD, GERD and OSA who presented to [facility] for generalized weakness and stroke like symptoms. Imaging negative for acute findings. ?  ?Clinical Impression ? Physical Therapy Evaluation completed this date. Patient tolerated session well and was agreeable to treatment. Upon entry into room patient was in bed resting, and awoke easily to his name. Patient was alert and oriented x3, and reported no pain. Patient states he was Mod I/Independent prior to hospitalization, and did not use any AD at baseline. He lives in a 1 story mobile home with his wife, with 1 steps to enter. Wife is able to provide 24/7 assistance if need be. Patient demonstrated impaired balance and safety awareness throughout evaluation. Strength in BUEs was reported as WFL, and BLEs was reported at 4/5 strength. CGA was required to complete supine to sit transfer with increased time and effort noted, however only required SUP to complete sit to supine transfer. In sitting patient demonstrated good sitting balance, and only required SBA/SUP with RW to complete sit to stand, however upon standing patient demonstrate increased anterior/posterior and lateral swaying. No LOB noted, however CGA required to safety. Patient ambulated around 82fet on 2L via [place] with RW at CGA-MIN A. Increased swaying noted during ambulation with RW however no LOB noted. Patient was left in bed with all needs in reach. Patient would continue to benefit from skilled physical therapy in order to optimize patient's return to PLOF. Recommend HHPT upon discharge from acute hospitalization.  ?   ? ?Recommendations for follow up therapy are one component of a multi-disciplinary discharge planning process, led by the attending  physician.  Recommendations may be updated based on patient status, additional functional criteria and insurance authorization. ? ?Follow Up Recommendations Home health PT ? ?  ?Assistance Recommended at Discharge Frequent or constant Supervision/Assistance  ?Patient can return home with the following ? A little help with walking and/or transfers;Help with stairs or ramp for entrance;Assist for transportation ? ?  ?Equipment Recommendations Rolling walker (2 wheels)  ?Recommendations for Other Services ?    ?  ?Functional Status Assessment Patient has had a recent decline in their functional status and demonstrates the ability to make significant improvements in function in a reasonable and predictable amount of time.  ? ?  ?Precautions / Restrictions Precautions ?Precautions: Fall ?Restrictions ?Weight Bearing Restrictions: No  ? ?  ? ?Mobility ? Bed Mobility ?Overal bed mobility: Needs Assistance ?Bed Mobility: Supine to Sit, Sit to Supine ?  ?  ?Supine to sit: Min guard, HOB elevated ?Sit to supine: Supervision, HOB elevated ?  ?  ?  ? ?Transfers ?Overall transfer level: Needs assistance ?Equipment used: Rolling walker (2 wheels) ?Transfers: Sit to/from Stand ?Sit to Stand: Supervision (SBA) ?  ?  ?  ?  ?  ?General transfer comment: Increased swaying noted upon standing, RW utilized for stability ?  ? ?Ambulation/Gait ?Ambulation/Gait assistance: Min guard, Min assist ?Gait Distance (Feet): 80 Feet ?Assistive device: Rolling walker (2 wheels) ?Gait Pattern/deviations: Step-through pattern, Decreased step length - right, Decreased step length - left, Decreased stride length, Narrow base of support, Staggering left, Staggering right ?Gait velocity: decreased ?  ?  ?General Gait Details: Increased unsteadiness, R/L lateral sway noted during ambulation, Min A to help remain stable,  no LOB noted ? ?Stairs ?  ?  ?  ?  ?  ? ?Wheelchair Mobility ?  ? ?Modified Rankin (Stroke Patients Only) ?  ? ?  ? ?Balance Overall  balance assessment: Needs assistance ?Sitting-balance support: Feet supported, No upper extremity supported ?Sitting balance-[name]: Good ?Sitting balance - Comments: steady with dynamic sitting balance at EOB. Able to reach outside BOS ?  ?Standing balance support: Bilateral upper extremity supported, During functional activity, Reliant on assistive device for balance ?Standing balance-[name]: Fair ?Standing balance comment: noted R/L lateral sway during ambulation ?  ?  ?  ?  ?  ?  ?  ?  ?  ?  ?  ?   ? ? ? ?Pertinent Vitals/Pain Pain Assessment ?Pain Assessment: 0-10 ?Pain Score: 0-No pain ?Pain Intervention(s): Monitored during session  ? ? ?Home Living Family/patient expects to be discharged to:: Private residence ?Living Arrangements: Spouse/significant other ?Available Help at Discharge: Family;Available PRN/intermittently;Available 24 hours/day ?Type of Home: Mobile home ?Home Access: Stairs to enter ?Entrance Stairs-Rails: Right ?Entrance Stairs-Number of Steps: 1 ?  ?Home Layout: One level ?Home Equipment: Tub bench;Cane - quad ?Additional Comments: Pt reports he has a 3WW but does not use  ?  ?Prior Function Prior Level of Function : Independent/Modified Independent ?  ?  ?  ?  ?  ?  ?Mobility Comments: No AD at baseline ?ADLs Comments: Independent, driving, community ambulator. Endorses two additional non-injurious falls in last 6 months PTA. ?  ? ? ?Hand Dominance  ? Dominant Hand: Left ? ?  ?Extremity/Trunk Assessment  ? Upper Extremity Assessment ?Upper Extremity Assessment: Overall WFL for tasks assessed ?  ? ?Lower Extremity Assessment ?Lower Extremity Assessment: Generalized weakness (at least 4/5 strength bilaterally, sensation intact bilaterally) ?  ? ?Cervical / Trunk Assessment ?Cervical / Trunk Assessment: Normal  ?Communication  ? Communication: No difficulties  ?Cognition Arousal/Alertness: Awake/alert ?Behavior During Therapy: [facility] for tasks assessed/performed ?Overall Cognitive  Status: Within Functional Limits for tasks assessed ?  ?  ?  ?  ?  ?  ?  ?  ?  ?  ?  ?  ?  ?  ?  ?  ?General Comments: A&Ox3 self location sitaution ?  ?  ? ?  ?General Comments General comments (skin integrity, edema, etc.): BP: 156/67, HR ranged from 65-110bpm, SpO2 remained >90% on 2L via [place] ? ?  ?Exercises Other Exercises ?Other Exercises: Patient educated on role of PT in acute care setting, safe use of DME, fall risk, and d/c recommendations  ? ?Assessment/Plan  ?  ?PT Assessment Patient needs continued PT services  ?PT Problem List Decreased strength;Decreased balance;Decreased activity tolerance;Decreased cognition;Decreased safety awareness ? ?   ?  ?PT Treatment Interventions DME instruction;Therapeutic exercise;Balance training;Gait training;Therapeutic activities;Cognitive remediation   ? ?PT Goals (Current goals can be found in the Care Plan section)  ?Acute Rehab PT Goals ?Patient Stated Goal: to get better ?PT Goal Formulation: With patient ?Time For Goal Achievement: 02/13/22 ?Potential to Achieve Goals: Good ? ?  ?Frequency Min 2X/week ?  ? ? ?Co-evaluation   ?  ?  ?  ?  ? ? ?  ?AM-PAC PT "6 Clicks" Mobility  ?Outcome Measure Help needed turning from your back to your side while in a flat bed without using bedrails?: A Little ?Help needed moving from lying on your back to sitting on the side of a flat bed without using bedrails?: A Little ?Help needed moving to and from a bed  to a chair (including a wheelchair)?: A Little ?Help needed standing up from a chair using your arms (e.g., wheelchair or bedside chair)?: A Little ?Help needed to walk in hospital room?: A Little ?Help needed climbing 3-5 steps with a railing? : A Little ?6 Click Score: 18 ? ?  ?End of Session Equipment Utilized During Treatment: Gait belt ?Activity Tolerance: Patient tolerated treatment well ?Patient left: in bed;with call bell/phone within reach ?Nurse Communication: Mobility status ?PT Visit Diagnosis: Unsteadiness on feet  (R26.81);Muscle weakness (generalized) (M62.81);Other symptoms and signs involving the nervous system (R29.898) ?  ? ?Time: 9629-5284 ?PT Time Calculation (min) (ACUTE ONLY): 25 min ? ? ?Charges:   PT Evaluation

## 2022-01-30 NOTE — Assessment & Plan Note (Signed)
-   We will continue Tegretol and Depakote ER as well as Zyprexa. ?

## 2022-01-30 NOTE — ED Notes (Signed)
Pt assisted with a urinal  

## 2022-01-30 NOTE — Progress Notes (Signed)
?PROGRESS NOTE ? ?Bradley Bowers    DOB: [birth year], 76 y.o.  ?NLG:[id number]  ?  Code Status: DNR   ?DOA: 01/29/2022   LOS: 0  ? ?Brief hospital course  ?Bradley Bowers is a 76 y.o. male with a PMH significant for dyslipidemia, CAD, COPD, on home O2 at 2 L/min nightly, PAD and obstructive sleep apnea. ?They presented from home to the ED on 01/29/2022 with slurred speech and right sided extremity weakness. ?In the ED, they had CT of the head and neck with no acute intracranial process and no hemodynamically, significant stenosis in the neck and no intracranial large vessel occlusion or significant stenosis. ?Brain MRI without contrast revealed no evidence for acute intracranial process. ?Neurosurgery was consulted and recommended stroke work up. ?They were treated with 300 mg p.o. Plavix and will be continued on his baby aspirin.    ?Patient was admitted to medicine service for further workup and management of TIA as outlined in detail below. ? ?01/30/22 -stable, improved ? ?Assessment & Plan  ?Principal Problem: ?  TIA (transient ischemic attack) ?Active Problems: ?  Bipolar 1 disorder ([place]) ?  Chronic obstructive pulmonary disease (COPD) ([place]) ?  Essential hypertension ?  Peripheral neuropathy ? ?CVA-  with residual right leg weakness. Stroke workup has been completed and no further workup suggested by neurology, Dr. Quinn Axe. He was cleared for discharge home with HHPT and outpatient neurology follow up. OT and SLP did not recommend any follow up. ?- ASA '81mg'$  + plavix x21 days and then plavix alone ?- continue statin '80mg'$  ?- patient was discharged with a rolling walker but did not feel safe going home and wife did not feel safe taking him home so he will stay another night in observation. I don't expect a significant change in status by tomorrow but hopeful they will feel more comfortable going home after some more time in observation.  ?  ?Bipolar 1 disorder ([place]) ?- continue Tegretol and Depakote ER as well as  Zyprexa. ?  ?Chronic obstructive pulmonary disease (COPD) ([place]) ?- continue albuterol while holding off his Advair discus. ?- continue Singulair. ?  ?Peripheral neuropathy ?-  continue Neurontin. ?  ?Essential hypertension ?- continue his antihypertensives  ?  ? ?Body mass index is 32.6 kg/m?. ? ?VTE ppx: lovenox ? ?Diet:  ?   ?Diet  ? Diet regular Room service appropriate? Yes; Fluid consistency: Thin  ? ?Subjective 01/30/22   ? ?Pt reports feeling improved. Continues to have R le weakness. Feels uncomfortable going home or walking. ?  ?Objective  ? ?Vitals:  ? 01/30/22 1430 01/30/22 1500 01/30/22 1530 01/30/22 1600  ?BP: (!) 143/72 (!) 146/73 (!) 154/67 (!) 155/87  ?Pulse: 62 61 60 (!) 56  ?Resp:      ?Temp:      ?TempSrc:      ?SpO2: 97% 95% 96% 94%  ?Weight:      ?Height:      ? ?No intake or output data in the 24 hours ending 01/30/22 1844 ?Filed Weights  ? 01/29/22 2006  ?Weight: 91.6 kg  ?  ? ?Physical Exam:  ?General: awake, alert, NAD ?HEENT: atraumatic, clear conjunctiva, anicteric sclera, MMM, hearing grossly normal ?Respiratory: normal respiratory effort. ?Cardiovascular: quick capillary refill, normal S1/S2, RRR, no JVD, murmurs ?Nervous: A&O x3. normal speech. Mild droop to left mouth. Mild increased weakness of right leg.  ?Extremities: moves all equally, no edema, normal tone ?Skin: dry, intact, normal temperature, normal color. No rashes, lesions or ulcers  on exposed skin ?Psychiatry:anxious mood ? ?Labs   ?I have personally reviewed the following labs and imaging studies ?CBC ?   ?Component Value Date/Time  ? WBC 9.0 01/29/2022 2102  ? RBC 4.94 01/29/2022 2102  ? HGB 14.5 01/29/2022 2102  ? HCT 45.4 01/29/2022 2102  ? PLT 144 (L) 01/29/2022 2102  ? MCV 91.9 01/29/2022 2102  ? MCH 29.4 01/29/2022 2102  ? MCHC 31.9 01/29/2022 2102  ? RDW 13.8 01/29/2022 2102  ? LYMPHSABS 2.0 01/29/2022 2102  ? MONOABS 0.7 01/29/2022 2102  ? EOSABS 0.4 01/29/2022 2102  ? BASOSABS 0.0 01/29/2022 2102  ? ? ?  Latest Ref  Rng & Units 01/29/2022  ?  9:02 PM 05/25/2021  ?  5:17 AM 05/24/2021  ?  4:07 AM  ?BMP  ?Glucose 70 - 99 mg/dL 95   89   95    ?BUN 8 - 23 mg/dL 20   33   30    ?Creatinine 0.61 - 1.24 mg/dL 1.14   1.27   1.28    ?Sodium 135 - 145 mmol/L 139   140   140    ?Potassium 3.5 - 5.1 mmol/L 4.4   4.7   3.9    ?Chloride 98 - 111 mmol/L 102   96   96    ?CO2 22 - 32 mmol/L 30   34   33    ?Calcium 8.9 - 10.3 mg/dL 8.4   8.9   8.7    ? ? ?MR BRAIN WO CONTRAST ? ?Result Date: 01/30/2022 ?CLINICAL DATA:  Stroke suspected, right leg weakness and gait difficulty EXAM: MRI HEAD WITHOUT CONTRAST TECHNIQUE: Multiplanar, multiecho pulse sequences of the brain and surrounding structures were obtained without intravenous contrast. COMPARISON:  12/03/2021 MRI head, correlation is also made with CTA head neck 01/29/2022 FINDINGS: Brain: No restricted diffusion to suggest acute or subacute infarct. No acute hemorrhage, mass, mass effect, or midline shift. No hydrocephalus or extra-axial collection. Degree of cerebral volume loss is advanced for age but unchanged compared to 12/03/2021. Scattered T2 hyperintense signal in the periventricular white matter, likely the sequela of mild-to-moderate chronic small vessel ischemic disease. Redemonstrated remote right posterior temporal lobe cortical infarct with hemosiderin deposition, likely associated petechial hemorrhage. Punctate foci of hemosiderin deposition in the right cerebellum, unchanged and likely the sequela of prior hypertensive microhemorrhages. Vascular: Normal flow voids. Skull and upper cervical spine: Normal marrow signal. Sinuses/Orbits: No acute finding. Status post bilateral lens replacements. Other: The mastoids are well aerated. IMPRESSION: No acute intracranial process. No evidence of acute or subacute infarct. Electronically Signed   By: Merilyn Baba M.D.   On: 01/30/2022 00:37  ? ?ECHOCARDIOGRAM LIMITED BUBBLE STUDY ? ?Result Date: 01/30/2022 ?   ECHOCARDIOGRAM LIMITED  REPORT   Patient Name:   Bradley Bowers Date of Exam: 01/30/2022 Medical Rec #:  [id number]         Height:       66.0 in Accession #:    [id number]        Weight:       202.0 lb Date of Birth:  [birth year]         BSA:          2.008 m? Patient Age:    40 years          BP:           120/52 mmHg Patient Gender: M  HR:           56 bpm. Exam Location:  [facility] Procedure: Limited Echo, Limited Color Doppler, Cardiac Doppler and Saline            Contrast Bubble Study Indications:     G45.9 TIA  History:         Patient has prior history of Echocardiogram examinations. COPD;                  Risk Factors:Hypertension, Dyslipidemia and Sleep Apnea.  Sonographer:     Charmayne Sheer Referring Phys:  [id number] [place] Diagnosing Phys: Isaias Cowman MD  Sonographer Comments: Image acquisition challenging due to COPD and Image acquisition challenging due to respiratory motion. IMPRESSIONS  1. Left ventricular ejection fraction, by estimation, is 55 to 60%. The left ventricle has normal function. The left ventricle has no regional wall motion abnormalities.  2. Right ventricular systolic function is normal. The right ventricular size is normal.  3. The mitral valve is normal in structure. Trivial mitral valve regurgitation. No evidence of mitral stenosis.  4. The aortic valve is normal in structure. Aortic valve regurgitation is not visualized. No aortic stenosis is present.  5. The inferior vena cava is normal in size with greater than 50% respiratory variability, suggesting right atrial pressure of 3 mmHg.  6. Agitated saline contrast bubble study was negative, with no evidence of any interatrial shunt. FINDINGS  Left Ventricle: Left ventricular ejection fraction, by estimation, is 55 to 60%. The left ventricle has normal function. The left ventricle has no regional wall motion abnormalities. The left ventricular internal cavity size was normal in size. There is  no left ventricular hypertrophy. Right  Ventricle: The right ventricular size is normal. No increase in right ventricular wall thickness. Right ventricular systolic function is normal. Left Atrium: Left atrial size was normal in size. Right Atrium: R

## 2022-01-30 NOTE — TOC Transition Note (Signed)
Transition of Care (TOC) - CM/SW Discharge Note ? ? ?Patient Details  ?Name: Bradley Bowers ?MRN: [id number] ?Date of Birth: 01/15/1946 ? ?Transition of Care (TOC) CM/SW Contact:  ?Shelbie Hutching, RN ?Phone Number: ?01/30/2022, 3:07 PM ? ? ?Clinical Narrative:    ?Patient medically cleared for discharge home with home health services.  Merleen Nicely with [facility] has accepted referral for PT and OT.  Patient's wife will transport him home.  ? ? ?Final next level of care: [place] ?Barriers to Discharge: Barriers Resolved ? ? ?Patient Goals and CMS Choice ?Patient states their goals for this hospitalization and ongoing recovery are:: patient agrees to home health ?CMS Medicare.gov Compare Post Acute Care list provided to:: Patient ?Choice offered to / list presented to : Patient ? ?Discharge Placement ?  ?           ?  ?  ?  ?  ? ?Discharge Plan and Services ?  ?Discharge Planning Services: CM Consult ?           ?DME Arranged: N/A ?DME Agency: NA ?  ?  ?  ?HH Arranged: PT, OT ?[place] Agency: [facility] ?Date HH Agency Contacted: 01/30/22 ?Time [place]: 1506 ?Representative spoke with at [place]: Merleen Nicely ? ?Social Determinants of Health (SDOH) Interventions ?  ? ? ?Readmission Risk Interventions ?   ? View : No data to display.  ?  ?  ?  ? ? ? ? ? ?

## 2022-01-30 NOTE — ED Notes (Signed)
Dietary called to request breakfast tray for the patient.  ?

## 2022-01-30 NOTE — Evaluation (Signed)
Occupational Therapy Evaluation Patient Details Name: Bradley Bowers MRN: [id number] DOB: [birth year] Today's Date: 01/30/2022   History of Present Illness Patient is a 76 year old with a PMH (+) for asthma, COPD on 2 L nasal cannula chronically, HTN, HDL, CAD, GERD and OSA who presented to [facility] for generalized weakness and stroke like symptoms. Imaging negative for acute findings.   Clinical Impression   Pt was seen for OT evaluation this date. Prior to hospital admission, pt was active and independent with all ADL management. Pt lives at home with his spouse who is able to provide 24/7 supervision if needed. Pt lives in a mobile home with 1 STE. Currently pt demonstrates impairments with balance and safety awareness, which functionally limit his ability to perform ADL/self-care tasks. Pt currently requires SUPERVISION for LB dressing and standing grooming at sink. He requires intermittent MIN A with dynamic standing tasks 2/2 occasional LOB with functional mobility and directional changes.  Pt would benefit from skilled OT services to address noted impairments and functional limitations (see below for any additional details) in order to maximize safety and independence while minimizing falls risk and caregiver burden. Do not anticipate the need for follow up OT services upon hospital DC.        Recommendations for follow up therapy are one component of a multi-disciplinary discharge planning process, led by the attending physician.  Recommendations may be updated based on patient status, additional functional criteria and insurance authorization.   Follow Up Recommendations  No OT follow up    Assistance Recommended at Discharge    Patient can return home with the following      Functional Status Assessment     Equipment Recommendations       Recommendations for Other Services       Precautions / Restrictions Precautions Precautions: Fall Restrictions Weight Bearing  Restrictions: No      Mobility Bed Mobility Overal bed mobility: Modified Independent             General bed mobility comments: HOB elevated no physical assist for multiple sup<>sit transfers during session.    Transfers Overall transfer level: Needs assistance Equipment used: Rolling walker (2 wheels) Transfers: Sit to/from Stand Sit to Stand: Supervision           General transfer comment: Supervision to MOD I for STS transfers from bed in lowest position.      Balance Overall balance assessment: Needs assistance Sitting-balance support: Feet supported, No upper extremity supported Sitting balance-[name]: Normal Sitting balance - Comments: steady with dynamic sitting balance at EOB. Able to reach outside BOS during LB dressing task.   Standing balance support: Single extremity supported, During functional activity Standing balance-[name]: Fair Standing balance comment: noted R lateral lean with instability during dynamic standing tasks and positional changes.                           ADL either performed or assessed with clinical judgement   ADL Overall ADL's : Needs assistance/impaired                                       General ADL Comments: Pt at or near baseline level of functional independence. He requires intermittent min A for stability with functional mobility during session, but is ultimately able to complete bed mobility, LB dressing task, and standing  grooming at sink with supervision assist and cueing for safety. Pt would benefit from additional instruction in falls prevention and safe transfer techniques.     Vision Patient Visual Report: No change from baseline       Perception     Praxis      Pertinent Vitals/Pain Pain Assessment Pain Assessment: No/denies pain     Hand Dominance Left   Extremity/Trunk Assessment Upper Extremity Assessment Upper Extremity Assessment: Overall WFL for tasks  assessed   Lower Extremity Assessment Lower Extremity Assessment: Overall WFL for tasks assessed;Defer to PT evaluation (Pt endorses RLE tingling/weakness.)   Cervical / Trunk Assessment Cervical / Trunk Assessment: Normal   Communication Communication Communication: No difficulties   Cognition Arousal/Alertness: Awake/alert Behavior During Therapy: WFL for tasks assessed/performed Overall Cognitive Status: Within Functional Limits for tasks assessed                                 General Comments: A&Ox3 self location sitaution, mildly impulsive t/o session. Requires cueing for safety. Appears to be baseline.     General Comments       Exercises Other Exercises Other Exercises: Pt/spouse educated on role of OT in acute setting, safe use of AE/DME for ADL management, routines modifications to support safey and functional independence upon hospital DC and BE FAST stroke education.   Shoulder Instructions      Home Living Family/patient expects to be discharged to:: Private residence Living Arrangements: Spouse/significant other Available Help at Discharge: Family;Available PRN/intermittently;Available 24 hours/day Type of Home: Mobile home Home Access: Stairs to enter Entrance Stairs-Number of Steps: 1 Entrance Stairs-Rails: Right Home Layout: One level     Bathroom Shower/Tub: Chief Strategy Officer: Standard Bathroom Accessibility: Yes How Accessible: Accessible via walker Home Equipment: Tub bench;Cane - quad   Additional Comments: Pt reports he has a 3WW but does not use      Prior Functioning/Environment Prior Level of Function : Independent/Modified Independent             Mobility Comments: No AD at baseline ADLs Comments: Independent, driving, community ambulator. Endorses two additional non-injurious falls in last 6 months PTA.        OT Problem List: Decreased coordination;Decreased safety awareness;Impaired balance  (sitting and/or standing);Decreased knowledge of use of DME or AE      OT Treatment/Interventions: Self-care/ADL training;Therapeutic exercise;Therapeutic activities;DME and/or AE instruction;Patient/family education;Balance training    OT Goals(Current goals can be found in the care plan section) Acute Rehab OT Goals Patient Stated Goal: To get stronger. OT Goal Formulation: With patient Time For Goal Achievement: 02/13/22 Potential to Achieve Goals: Good ADL Goals Pt Will Perform Grooming: with modified independence;standing;with adaptive equipment Pt Will Perform Lower Body Dressing: with modified independence;sit to/from stand;with adaptive equipment Pt Will Transfer to Toilet: with modified independence;bedside commode;regular height toilet;ambulating (c LRAD PRN) Pt Will Perform Toileting - Clothing Manipulation and hygiene: sit to/from stand;with modified independence;with adaptive equipment (C LRAD PRN)  OT Frequency: Min 2X/week    Co-evaluation              AM-PAC OT "6 Clicks" Daily Activity     Outcome Measure Help from another person eating meals?: None Help from another person taking care of personal grooming?: None Help from another person toileting, which includes using toliet, bedpan, or urinal?: A Little Help from another person bathing (including washing, rinsing, drying)?: None Help from another person  to put on and taking off regular upper body clothing?: None Help from another person to put on and taking off regular lower body clothing?: A Little 6 Click Score: 22   End of Session Equipment Utilized During Treatment: Gait belt;Rolling walker (2 wheels) Nurse Communication: Mobility status  Activity Tolerance: Patient tolerated treatment well Patient left: in bed;with call bell/phone within reach;with family/visitor present  OT Visit Diagnosis: Other abnormalities of gait and mobility (R26.89);Hemiplegia and hemiparesis Hemiplegia - Right/Left:  Right Hemiplegia - dominant/non-dominant: Non-Dominant Hemiplegia - caused by: Unspecified                Time: 1005-1035 OT Time Calculation (min): 30 min Charges:  OT General Charges $OT Visit: 1 Visit OT Evaluation $OT Eval Moderate Complexity: 1 Mod OT Treatments $Therapeutic Activity: 8-22 mins  Rockney Ghee, M.S., OTR/L Feeding Team - [facility] Ascom: [phone] 01/30/22, 12:27 PM

## 2022-01-30 NOTE — TOC Initial Note (Signed)
Transition of Care (TOC) - Initial/Assessment Note  ? ? ?Patient Details  ?Name: Bradley Bowers ?MRN: [id number] ?Date of Birth: 1946-03-14 ? ?Transition of Care (TOC) CM/SW Contact:    ?Shelbie Hutching, RN ?Phone Number: ?01/30/2022, 11:45 AM ? ?Clinical Narrative:                 ?Patient placed under observation for TIA.  RNCM met with patient at the bedside introduced self and explained role in DC planning.  Patient reports still having weakness in his legs.  He is independent at home and drives.  He is current with Dr. Sabra Heck for PCP services.  He is on chronic oxygen through [employer] [place] 2L, patient also has a cane and walker and shower chair, he does not normally use any assistive devices at home to ambulate.   ?Patient agrees to having home health if recommended.  His wife will be able to pick him up at discharge.  ? ?Expected Discharge Plan: [place] ?Barriers to Discharge: Continued Medical Work up ? ? ?Patient Goals and CMS Choice ?Patient states their goals for this hospitalization and ongoing recovery are:: patient agrees to home health ?CMS Medicare.gov Compare Post Acute Care list provided to:: Patient ?Choice offered to / list presented to : Patient ? ?Expected Discharge Plan and Services ?Expected Discharge Plan: [place] ?  ?Discharge Planning Services: CM Consult ?  ?Living arrangements for the past 2 months: [place] ?                ?DME Arranged: N/A ?DME Agency: NA ?  ?  ?  ?HH Arranged: PT, OT ?  ?  ?  ?  ? ?Prior Living Arrangements/Services ?Living arrangements for the past 2 months: [place] ?Lives with:: Spouse ?Patient language and need for interpreter reviewed:: Yes ?Do you feel safe going back to the place where you live?: Yes      ?Need for Family Participation in Patient Care: Yes (Comment) ?Care giver support system in place?: Yes (comment) (wife) ?Current home services: DME (cane and RW and oxygen) ?Criminal Activity/Legal  Involvement Pertinent to Current Situation/Hospitalization: No - Comment as needed ? ?Activities of Daily Living ?Home Assistive Devices/Equipment: None ?ADL Screening (condition at time of admission) ?Patient's cognitive ability adequate to safely complete daily activities?: Yes ?Is the patient deaf or have difficulty hearing?: No ?Does the patient have difficulty seeing, even when wearing glasses/contacts?: No ?Does the patient have difficulty concentrating, remembering, or making decisions?: No ?Patient able to express need for assistance with ADLs?: Yes ?Does the patient have difficulty dressing or bathing?: No ?Independently performs ADLs?: Yes (appropriate for developmental age) ?Does the patient have difficulty walking or climbing stairs?: Yes ?Weakness of Legs: None ?Weakness of Arms/Hands: None ? ?Permission Sought/Granted ?Permission sought to share information with : Case Manager, Family Supports, Other (comment) ?Permission granted to share information with : Yes, Verbal Permission Granted ? Share Information with NAME: Vaun Hyndman ?   ? Permission granted to share info w Relationship: spouse ? Permission granted to share info w Contact Information: [phone] ? ?Emotional Assessment ?Appearance:: Appears stated age ?Attitude/Demeanor/Rapport: Engaged ?Affect (typically observed): Accepting ?Orientation: : Oriented to Self, Oriented to Place, Oriented to  Time, Oriented to Situation ?Alcohol / Substance Use: Not Applicable ?Psych Involvement: No (comment) ? ?Admission diagnosis:  TIA (transient ischemic attack) [G45.9] ?Patient Active Problem List  ? Diagnosis Date Noted  ? Essential hypertension 01/30/2022  ?  Peripheral neuropathy 01/30/2022  ? TIA (transient ischemic attack) 01/29/2022  ? Acute respiratory failure with hypoxia ([place]) 05/22/2021  ? COPD with acute exacerbation ([place]) 05/22/2021  ? Hyperlipemia 05/22/2021  ? Bipolar 1 disorder ([place]) 05/22/2021  ? Renal insufficiency 05/22/2021  ? Leg  edema 05/22/2021  ? Coronary artery disease 09/24/2016  ? Glaucoma 09/24/2016  ? Bipolar affective disorder, current episode manic with psychotic symptoms ([place]) 09/23/2016  ? Chronic obstructive pulmonary disease (COPD) ([place]) 11/06/2015  ? Gastro-esophageal reflux disease without esophagitis 10/24/2014  ? Obstructive apnea 10/24/2014  ? Restless leg 10/24/2014  ? ?PCP:  Rusty Aus, MD ?Pharmacy:   ?[id number] [place], [place] - [address] ?[address] ?[id number]?Phone: [phone]: [phone]? ?[id number] [place] [id number]: [phone]: [phone] ? ? ? ?Social Determinants of Health (SDOH) Interventions ?  ? ?Readmission Risk Interventions ?   ? View : No data to display.  ?  ?  ?  ? ? ? ?

## 2022-01-30 NOTE — Progress Notes (Signed)
SLP Cancellation Note ? ?Patient Details ?Name: Bradley Bowers ?MRN: [id number] ?DOB: 01/29/1946 ? ? ?Cancelled treatment:       Reason Eval/Treat Not Completed: SLP screened, no needs identified, will sign off Pt seen for cognitive communication screen. Pt independently oriented, participated in verbal/auditory communication at conversational level, and demonstrated recall of events from yesterday/today. Speech clear and intelligible. Pt endorsed slurred speech yesterday but that has resolved. Pt reports that he is at his cognitive baseline. RN denying concern for cognitive communication ability. No further SLP services indicated at this time.  ? ? ?Martinique J Clapp MS CCC-SLP ? ?Martinique J Clapp ?01/30/2022, 9:45 AM ?

## 2022-01-30 NOTE — Assessment & Plan Note (Signed)
-   We will continue Neurontin. ?

## 2022-01-30 NOTE — Discharge Summary (Signed)
? ? ?Physician Discharge Summary  ?Patient: Bradley Bowers DOB: 06-05-1946   Code Status: DNR ?Admit date: 01/29/2022 ?Discharge date: 02/04/2022 ?Disposition: Skilled nursing facility, PT and OT ?PCP: Rusty Aus, MD ? ?Recommendations for Outpatient Follow-up:  ?Follow up with PCP within 1-2 weeks ?Repeat labs to monitor kidney function and blood count. On day of discharge, Cr 1.41, platelets 144 ?Follow up with Dr. Manuella Ghazi ?Further evaluation of parkonsins ?Follow up with psychiatry ?Consider weaning from olanzapine ? ?Discharge Diagnoses:  ?Principal Problem: ?  TIA (transient ischemic attack) ?Active Problems: ?  Bipolar 1 disorder ([place]) ?  Chronic obstructive pulmonary disease (COPD) ([place]) ?  Essential hypertension ?  Peripheral neuropathy ?  CVA (cerebral vascular accident) [facility]) ?  Parkinsonian features ? ?Brief Hospital Course Summary: ?Bradley Bowers is a 76 y.o. male with a PMH significant for dyslipidemia, CAD, COPD, on home O2 at 2 L/min nightly, PAD and obstructive sleep apnea. ?They presented from home to the ED on 01/29/2022 with slurred speech and right sided extremity weakness. ?In the ED, they had CT of the head and neck with no acute intracranial process and no hemodynamically, significant stenosis in the neck and no intracranial large vessel occlusion or significant stenosis. ?Brain MRI without contrast revealed no evidence for acute intracranial process. ?Neurosurgery was consulted and recommended stroke work up. ?Neurology was consulted and evaluated for stroke.  ?They were treated with 300 mg p.o. Plavix and will be continued on his baby aspirin. ?He had stable vital signs on his home 2L O2 and labs stable overall. ?He worked with PT/OT/SLP. His symptoms did improve some but not completely and they recommended discharge to SNF for further rehab.  ? ?Parksinson's- Due to his significant symptoms and lack of significant acute findings on head imaging, began to dig further into  cause with chart review and discussing with wife. Revealed that patient had been having workup outpatient by neurology for dizziness and balance issues for several months suspected due to Parkinson's. ?Neurology evaluated inpatient and agreed with this assessment which could be contributed to his psychiatric medications as he has been on olanzapine for 3-4 years.  ?He did not require any further inpatient workup and recommended to f/u with his neurologist, Dr. Manuella Ghazi outpatient.  ? ?HTN- patient had low/normal blood pressure throughout stay and his blood pressure medications were titrated down as listed below to prevent postural hypotension and contribute to his symptoms of dizziness.  ? ?Discharge Condition: Good, improved ?Recommended discharge diet: Regular healthy diet ? ?Consultations: ?Neurology  ? ?Procedures/Studies: ?Brain MRI  ? ?Discharge Instructions   ? ? Ambulatory referral to Neurology   Complete by: As directed ?  ? ?  ? ?Allergies as of 02/04/2022   ?No Known Allergies ?  ? ?  ?Medication List  ?  ? ?STOP taking these medications   ? ?furosemide 20 MG tablet ?Commonly known as: Lasix ?  ?gabapentin 300 MG capsule ?Commonly known as: NEURONTIN ?  ?hydrochlorothiazide 25 MG tablet ?Commonly known as: HYDRODIURIL ?  ?pantoprazole 40 MG tablet ?Commonly known as: PROTONIX ?  ?predniSONE 20 MG tablet ?Commonly known as: DELTASONE ?  ? ?  ? ?TAKE these medications   ? ?Advair Diskus 100-50 MCG/ACT Aepb ?Generic drug: fluticasone-salmeterol ?Inhale 1 puff into the lungs 2 (two) times daily. ?  ?albuterol 108 (90 Base) MCG/ACT inhaler ?Commonly known as: VENTOLIN HFA ?Inhale 1-2 puffs into the lungs every 4 (four) hours as needed. ?  ?aspirin 81 MG EC  tablet ?Take 81 mg by mouth daily. ?  ?atenolol 25 MG tablet ?Commonly known as: TENORMIN ?Take 0.5 tablets (12.5 mg total) by mouth daily. ?Start taking on: Feb 05, 2022 ?What changed:  ?medication strength ?how much to take ?  ?atorvastatin 80 MG tablet ?Commonly  known as: LIPITOR ?Take 1 tablet (80 mg total) by mouth daily. ?What changed: how much to take ?  ?carbamazepine 200 MG tablet ?Commonly known as: TEGRETOL ?Take 1 tablet by mouth 2 (two) times daily. ?  ?clopidogrel 75 MG tablet ?Commonly known as: PLAVIX ?Take 1 tablet (75 mg total) by mouth daily. ?  ?cyanocobalamin 1000 MCG tablet ?Take 1 tablet (1,000 mcg total) by mouth daily. ?  ?divalproex 500 MG 24 hr tablet ?Commonly known as: DEPAKOTE ER ?Take 3 tablets (1,500 mg total) by mouth at bedtime. ?  ?donepezil 5 MG tablet ?Commonly known as: ARICEPT ?Take 10 mg by mouth at bedtime. ?  ?fluticasone 50 MCG/ACT nasal spray ?Commonly known as: FLONASE ?Place 1 spray into both nostrils daily. ?  ?fluticasone furoate-vilanterol 100-25 MCG/INH Aepb ?Commonly known as: BREO ELLIPTA ?Inhale 1 puff into the lungs daily. ?  ?lisinopril 2.5 MG tablet ?Commonly known as: ZESTRIL ?Take 1 tablet (2.5 mg total) by mouth daily. ?What changed:  ?medication strength ?how much to take ?  ?montelukast 10 MG tablet ?Commonly known as: SINGULAIR ?Take 10 mg by mouth at bedtime. ?  ?OLANZapine 5 MG tablet ?Commonly known as: ZYPREXA ?Take 5 mg by mouth at bedtime. ?  ?timolol 0.5 % ophthalmic solution ?Commonly known as: TIMOPTIC ?1 drop 2 (two) times daily. ?  ?tiotropium 18 MCG inhalation capsule ?Commonly known as: SPIRIVA ?Place 18 mcg into inhaler and inhale daily. ?  ? ?  ? ?  ?  ? ? ?  ?Durable Medical Equipment  ?(From admission, onward)  ?  ? ? ?  ? ?  Start     Ordered  ? 01/30/22 1418  For home use only DME Walker rolling  Once       ?Question Answer Comment  ?Walker: With 5 Inch Wheels   ?Patient needs a walker to treat with the following condition CVA (cerebral vascular accident) [facility])   ?  ? 01/30/22 1417  ? ?  ?  ? ?  ? ? Contact information for follow-up providers   ? ? [facility] Follow up.   ?Why: [facility] will be calling you to schedule your initial home visit within the next 24 hours. ? ?  ?  ? ?   ?  ? ? Contact information for after-discharge care   ? ? Destination   ? ? [facility] .   ?Service: Skilled Nursing ?Contact information: ?[address] ?[place] ?[phone] ? ?  ?  ? ?  ?  ? ?  ?  ? ?  ? ? ?Subjective   ?Pt reports feeling better today. He is in high spirits to be going to rehab and have a better diagnosis for his symptoms.  ?Objective  ?Blood pressure (!) 120/52, pulse (!) 43, temperature 98.4 ?F (36.9 ?C), temperature source Oral, resp. rate 18, height '5\' 6"'$  (1.676 m), weight 91.6 kg, SpO2 99 %.  ? ?General: Pt is alert, awake, not in acute distress ?Cardiovascular: RRR, S1/S2 +, no rubs, no gallops ?Respiratory: CTA bilaterally, no wheezing, no rhonchi ?Abdominal: Soft, NT, ND, bowel sounds + ?Extremities: no edema, no cyanosis ?Neuro: a&o x3.  Moving all extremities equally with decreased strength in RLE. Mild left mouth droop. Speech normal.  ? ?The results of significant diagnostics from this hospitalization (including imaging, microbiology, ancillary and laboratory) are listed below for reference.  ? ?Imaging studies: ?MR BRAIN WO CONTRAST ? ?Result Date: 01/30/2022 ?CLINICAL DATA:  Stroke suspected, right leg weakness and gait difficulty EXAM: MRI HEAD WITHOUT CONTRAST TECHNIQUE: Multiplanar, multiecho pulse sequences of the brain and surrounding structures were obtained without intravenous contrast. COMPARISON:  12/03/2021 MRI head, correlation is also made with CTA head neck 01/29/2022 FINDINGS: Brain: No restricted diffusion to suggest acute or subacute infarct. No acute hemorrhage, mass, mass effect, or midline shift. No hydrocephalus or extra-axial collection. Degree of cerebral volume loss is advanced for age but unchanged compared to 12/03/2021. Scattered T2 hyperintense signal in the periventricular white matter, likely the sequela of mild-to-moderate chronic small vessel ischemic disease. Redemonstrated remote right  posterior temporal lobe cortical infarct with hemosiderin deposition, likely associated petechial hemorrhage. Punctate foci of hemosiderin deposition in the right cerebellum, unchanged and likely the seq

## 2022-01-30 NOTE — TOC Progression Note (Signed)
Transition of Care (TOC) - Progression Note  ? ? ?Patient Details  ?Name: Bradley Bowers ?MRN: [id number] ?Date of Birth: Apr 01, 1946 ? ?Transition of Care (TOC) CM/SW Contact  ?Shelbie Hutching, RN ?Phone Number: ?01/30/2022, 5:56 PM ? ?Clinical Narrative:    ?Patient's wife will not take patient home, she reported to ER nurse not feeling safe and she can't physically help him at home.   ?Patient will stay overnight and be reevaluated tomorrow.  PT has recommending home health so he does not qualify for SNF. ? ? ?Expected Discharge Plan: [place] ?Barriers to Discharge: Barriers Resolved ? ?Expected Discharge Plan and Services ?Expected Discharge Plan: [place] ?  ?Discharge Planning Services: CM Consult ?  ?Living arrangements for the past 2 months: [place] ?Expected Discharge Date: 01/30/22               ?DME Arranged: N/A ?DME Agency: NA ?  ?  ?  ?HH Arranged: PT, OT ?[place] Agency: [facility] ?Date HH Agency Contacted: 01/30/22 ?Time [place]: 1506 ?Representative spoke with at [place]: Merleen Nicely ? ? ?Social Determinants of Health (SDOH) Interventions ?  ? ?Readmission Risk Interventions ?   ? View : No data to display.  ?  ?  ?  ? ? ?

## 2022-01-30 NOTE — Discharge Instructions (Signed)
You had a stroke and have had a good recovery but this is still a warning sign that you are at risk for having more and worse strokes.  ?Please take these medications: atorvastatin '80mg'$  daily, plavix daily, aspirin '81mg'$  daily. ?You will need neurology follow up within 3 weeks so they can guide you on medications adjustments.  ?

## 2022-01-30 NOTE — Assessment & Plan Note (Signed)
-   We will continue his antihypertensives with permissive hypertension parameters. ?

## 2022-01-30 NOTE — Progress Notes (Signed)
Admission profile udpated ?

## 2022-01-30 NOTE — Progress Notes (Signed)
*  PRELIMINARY RESULTS* ?Echocardiogram ?2D Echocardiogram has been performed. ? ?Bradley Bowers ?01/30/2022, 10:03 AM ?

## 2022-01-31 ENCOUNTER — Encounter: Payer: Self-pay | Admitting: Student in an Organized Health Care Education/Training Program

## 2022-01-31 DIAGNOSIS — R531 Weakness: Principal | ICD-10-CM | POA: Insufficient documentation

## 2022-01-31 DIAGNOSIS — I639 Cerebral infarction, unspecified: Secondary | ICD-10-CM

## 2022-01-31 DIAGNOSIS — R600 Localized edema: Secondary | ICD-10-CM | POA: Diagnosis not present

## 2022-01-31 DIAGNOSIS — G459 Transient cerebral ischemic attack, unspecified: Secondary | ICD-10-CM | POA: Diagnosis not present

## 2022-01-31 DIAGNOSIS — I10 Essential (primary) hypertension: Secondary | ICD-10-CM | POA: Diagnosis not present

## 2022-01-31 NOTE — TOC Progression Note (Signed)
Transition of Care (TOC) - Progression Note  ? ? ?Patient Details  ?Name: Bradley Bowers ?MRN: [id number] ?Date of Birth: Oct 14, 1945 ? ?Transition of Care (TOC) CM/SW Contact  ?Shelbie Hutching, RN ?Phone Number: ?01/31/2022, 12:33 PM ? ?Clinical Narrative:    ? ?PT and OT reevaluated patient this morning and are now recommending SNF.  Bedside RN reported that this morning getting up patient almost fell.  Patient is agreeable to bed search.  RNCM started SNF workup. ? ?Expected Discharge Plan: [place] ?Barriers to Discharge: Barriers Resolved ? ?Expected Discharge Plan and Services ?Expected Discharge Plan: [place] ?  ?Discharge Planning Services: CM Consult ?  ?Living arrangements for the past 2 months: [place] ?Expected Discharge Date: 01/30/22               ?DME Arranged: N/A ?DME Agency: NA ?  ?  ?  ?HH Arranged: PT, OT ?[place] Agency: [facility] ?Date HH Agency Contacted: 01/30/22 ?Time [place]: 1506 ?Representative spoke with at [place]: Merleen Nicely ? ? ?Social Determinants of Health (SDOH) Interventions ?  ? ?Readmission Risk Interventions ?   ? View : No data to display.  ?  ?  ?  ? ? ?

## 2022-01-31 NOTE — ED Notes (Signed)
Pt helped up to bedside commode to have bowel movement  ?

## 2022-01-31 NOTE — Progress Notes (Signed)
Occupational Therapy Treatment ?Patient Details ?Name: Bradley Bowers ?MRN: [id number] ?DOB: 1945-10-29 ?Today's Date: 01/31/2022 ? ? ?History of present illness Patient is a 76 year old with a PMH (+) for asthma, COPD on 2 L nasal cannula chronically, HTN, HDL, CAD, GERD and OSA who presented to [facility] for generalized weakness and stroke like symptoms. Imaging negative for acute findings. ?  ?OT comments ? Chart reviewed to date, RN cleared pt for participation in OT tx session. Tx session targeted addressing safe functional mobilty, falls prevention during ADL task completion. Pt requires CGA with RW and frequent vcs for safe amb within room. ADLs completed with SET UP-CGA however frequent vcs required for safety and pacing. Discharge recommendation will be updated to STR. Pt is left as received, NAD, all needs met. OT will follow acutely.   ? ?Recommendations for follow up therapy are one component of a multi-disciplinary discharge planning process, led by the attending physician.  Recommendations may be updated based on patient status, additional functional criteria and insurance authorization. ?   ?Follow Up Recommendations ? Skilled nursing-short term rehab (<3 hours/day)  ?  ?Assistance Recommended at Discharge Intermittent Supervision/Assistance  ?Patient can return home with the following ? A little help with walking and/or transfers;A little help with bathing/dressing/bathroom;Assistance with cooking/housework;Assist for transportation;Help with stairs or ramp for entrance ?  ?Equipment Recommendations ? Other (comment) (per next venue of care)  ?  ?Recommendations for Other Services   ? ?  ?Precautions / Restrictions Precautions ?Precautions: Fall ?Restrictions ?Weight Bearing Restrictions: No  ? ? ?  ? ?Mobility Bed Mobility ?Overal bed mobility: Modified Independent ?  ?  ?  ?  ?  ?  ?  ?  ? ?Transfers ?Overall transfer level: Needs assistance ?Equipment used: Rolling walker (2 wheels) ?Transfers: Sit  to/from Stand ?Sit to Stand: Supervision ?  ?  ?  ?  ?  ?  ?  ?  ?Balance Overall balance assessment: Needs assistance ?Sitting-balance support: Feet supported ?Sitting balance-[name]: Good ?  ?  ?Standing balance support: Bilateral upper extremity supported, During functional activity, Reliant on assistive device for balance ?Standing balance-[name]: Poor ?  ?  ?  ?  ?  ?  ?  ?  ?  ?  ?  ?  ?   ? ?ADL either performed or assessed with clinical judgement  ? ?ADL Overall ADL's : Needs assistance/impaired ?  ?  ?Grooming: Wash/dry hands;Wash/dry face;Sitting;Standing;Set up;Min guard ?Grooming Details (indicate cue type and reason): SET UP in sitting, CGA in standing at edge of bed ?Upper Body Bathing: Sitting;Set up ?  ?Lower Body Bathing: Min guard;Sit to/from stand ?  ?Upper Body Dressing : Set up;Sitting ?  ?  ?  ?Toilet Transfer: Min guard;Ambulation;Rolling walker (2 wheels);Cueing for safety ?  ?Toileting- Water quality scientist and Hygiene: Min guard;Sit to/from stand ?Toileting - Clothing Manipulation Details (indicate cue type and reason): with urinal, posterior lean at edge of bed ?  ?  ?Functional mobility during ADLs: Min guard;Rolling walker (2 wheels) ?General ADL Comments: Pt with posterior and lateral sway requiring CGA affecting safe ADL mobility/task completion in standing ?  ? ?Extremity/Trunk Assessment   ?  ?  ?  ?  ?  ? ?Vision   ?  ?  ?Perception   ?  ?Praxis   ?  ? ?Cognition Arousal/Alertness: Awake/alert ?Behavior During Therapy: Impulsive ?Overall Cognitive Status: Within Functional Limits for tasks assessed ?  ?  ?  ?  ?  ?  ?  ?  ?  ?  ?  ?  ?  ?  ?  ?  ?  General Comments: Pt requires frequent vcs for safety, mildly impulsive ?  ?  ?   ?Exercises   ? ?  ?Shoulder Instructions   ? ? ?  ?General Comments BP 146/105 at end of session  ? ? ?Pertinent Vitals/ Pain       Pain Assessment ?Pain Assessment: No/denies pain ? ?Home Living   ?  ?  ?  ?  ?  ?  ?  ?  ?  ?  ?  ?  ?  ?  ?  ?  ?   ?  ? ?  ?Prior Functioning/Environment    ?  ?  ?  ?   ? ?Frequency ? Min 2X/week  ? ? ? ? ?  ?Progress Toward Goals ? ?OT Goals(current goals can now be found in the care plan section) ? Progress towards OT goals: Progressing toward goals ? ?Acute Rehab OT Goals ?Patient Stated Goal: get stronger ?OT Goal Formulation: With patient ?Time For Goal Achievement: 02/14/22 ?Potential to Achieve Goals: Good  ?Plan Discharge plan needs to be updated   ? ?Co-evaluation ? ? ?   ?  ?  ?  ?  ? ?  ?AM-PAC OT "6 Clicks" Daily Activity     ?Outcome Measure ? ? Help from another person eating meals?: None ?Help from another person taking care of personal grooming?: None ?Help from another person toileting, which includes using toliet, bedpan, or urinal?: None ?Help from another person bathing (including washing, rinsing, drying)?: A Little ?Help from another person to put on and taking off regular upper body clothing?: None ?Help from another person to put on and taking off regular lower body clothing?: A Little ?6 Click Score: 22 ? ?  ?End of Session Equipment Utilized During Treatment: Gait belt;Rolling walker (2 wheels) ? ?OT Visit Diagnosis: Other abnormalities of gait and mobility (R26.89);Hemiplegia and hemiparesis ?Hemiplegia - Right/Left: Right ?  ?Activity Tolerance Patient tolerated treatment well ?  ?Patient Left in bed;with call bell/phone within reach;with family/visitor present;with bed alarm set ?  ?Nurse Communication Mobility status ?  ? ?   ? ?Time: 2984-7308 ?OT Time Calculation (min): 22 min ? ?Charges: OT General Charges ?$OT Visit: 1 Visit ?OT Treatments ?$Self Care/Home Management : 8-22 mins ? ?Shanon Payor, OTD OTR/L  ?01/31/22, 1:03 PM  ?

## 2022-01-31 NOTE — Progress Notes (Signed)
?PROGRESS NOTE ? ?Bradley Bowers    DOB: Nov 29, 1945, 77 y.o.  ?YDX:[id number]  ?  Code Status: DNR   ?DOA: 01/29/2022   LOS: 0  ? ?Brief hospital course  ?Bradley Bowers is a 76 y.o. male with a PMH significant for dyslipidemia, CAD, COPD, on home O2 at 2 L/min nightly, PAD and obstructive sleep apnea. ?They presented from home to the ED on 01/29/2022 with slurred speech and right sided extremity weakness. ?In the ED, they had CT of the head and neck with no acute intracranial process and no hemodynamically, significant stenosis in the neck and no intracranial large vessel occlusion or significant stenosis. ?Brain MRI without contrast revealed no evidence for acute intracranial process. ?Neurosurgery was consulted and recommended stroke work up. ?They were treated with 300 mg p.o. Plavix and will be continued on his baby aspirin.    ?Patient was admitted to medicine service for further workup and management of TIA as outlined in detail below. ? ?01/31/22 -stable, improved ? ?Assessment & Plan  ?Principal Problem: ?  TIA (transient ischemic attack) ?Active Problems: ?  Bipolar 1 disorder ([name]) ?  Chronic obstructive pulmonary disease (COPD) ([name]) ?  Essential hypertension ?  Peripheral neuropathy ? ?CVA-  with residual right leg weakness. Stroke workup has been completed and no further workup suggested by neurology, Dr. Quinn Axe. He was cleared for discharge home with HHPT and outpatient neurology follow up. OT and SLP did not recommend any follow up yesterday but patient and wife felt uncomfortable going home. Today, PT reevaluation recommended SNF. ?- TOC consulted for SNF placement ?- ASA '81mg'$  + plavix x21 days and then plavix alone ?- continue statin '80mg'$  ?  ?Bipolar 1 disorder ([name]) ?- continue Tegretol and Depakote ER as well as Zyprexa. ?  ?COPD- ([name]) ?- continue albuterol while holding off his Advair discus. ?- continue Singulair. ?  ?Peripheral neuropathy ?-  continue Neurontin. ?  ?Essential  hypertension ?- continue his antihypertensives  ?  ? ?Body mass index is 32.6 kg/m?. ? ?VTE ppx: lovenox ? ?Diet:  ?   ?Diet  ? Diet regular Room service appropriate? Yes; Fluid consistency: Thin  ? ?Subjective 01/31/22   ? ?Pt reports feeling OK . Denies any complaints. ?  ?Objective  ? ?Vitals:  ? 01/31/22 0000 01/31/22 0130 01/31/22 0400 01/31/22 0805  ?BP: (!) 144/66 136/67 (!) 121/54 136/82  ?Pulse: 63 63 (!) 58 74  ?Resp: '12 14 12 16  '$ ?Temp:      ?TempSrc:      ?SpO2: 97% 98% 94% 94%  ?Weight:      ?Height:      ? ? ?Intake/Output Summary (Last 24 hours) at 01/31/2022 0953 ?Last data filed at 01/31/2022 0024 ?Gross per 24 hour  ?Intake --  ?Output 650 ml  ?Net -650 ml  ? ?Filed Weights  ? 01/29/22 2006  ?Weight: 91.6 kg  ?  ? ?Physical Exam:  ?General: awake, alert, NAD ?HEENT: atraumatic, clear conjunctiva, anicteric sclera, MMM, hearing grossly normal ?Respiratory: normal respiratory effort. ?Cardiovascular: quick capillary refill, normal S1/S2, RRR, no JVD, murmurs ?Nervous: A&O x3. normal speech. Mild droop to left mouth. Mild increased weakness of right leg.  ?Extremities: moves all equally, no edema, normal tone ?Skin: dry, intact, normal temperature, normal color. No rashes, lesions or ulcers on exposed skin ?Psychiatry:anxious mood ? ?Labs   ?I have personally reviewed the following labs and imaging studies ?CBC ?   ?Component Value Date/Time  ? WBC 9.0 01/29/2022 2102  ?  RBC 4.94 01/29/2022 2102  ? HGB 14.5 01/29/2022 2102  ? HCT 45.4 01/29/2022 2102  ? PLT 144 (L) 01/29/2022 2102  ? MCV 91.9 01/29/2022 2102  ? MCH 29.4 01/29/2022 2102  ? MCHC 31.9 01/29/2022 2102  ? RDW 13.8 01/29/2022 2102  ? LYMPHSABS 2.0 01/29/2022 2102  ? MONOABS 0.7 01/29/2022 2102  ? EOSABS 0.4 01/29/2022 2102  ? BASOSABS 0.0 01/29/2022 2102  ? ? ?  Latest Ref Rng & Units 01/29/2022  ?  9:02 PM 05/25/2021  ?  5:17 AM 05/24/2021  ?  4:07 AM  ?BMP  ?Glucose 70 - 99 mg/dL 95   89   95    ?BUN 8 - 23 mg/dL 20   33   30    ?Creatinine  0.61 - 1.24 mg/dL 1.14   1.27   1.28    ?Sodium 135 - 145 mmol/L 139   140   140    ?Potassium 3.5 - 5.1 mmol/L 4.4   4.7   3.9    ?Chloride 98 - 111 mmol/L 102   96   96    ?CO2 22 - 32 mmol/L 30   34   33    ?Calcium 8.9 - 10.3 mg/dL 8.4   8.9   8.7    ? ? ?MR BRAIN WO CONTRAST ? ?Result Date: 01/30/2022 ?CLINICAL DATA:  Stroke suspected, right leg weakness and gait difficulty EXAM: MRI HEAD WITHOUT CONTRAST TECHNIQUE: Multiplanar, multiecho pulse sequences of the brain and surrounding structures were obtained without intravenous contrast. COMPARISON:  12/03/2021 MRI head, correlation is also made with CTA head neck 01/29/2022 FINDINGS: Brain: No restricted diffusion to suggest acute or subacute infarct. No acute hemorrhage, mass, mass effect, or midline shift. No hydrocephalus or extra-axial collection. Degree of cerebral volume loss is advanced for age but unchanged compared to 12/03/2021. Scattered T2 hyperintense signal in the periventricular white matter, likely the sequela of mild-to-moderate chronic small vessel ischemic disease. Redemonstrated remote right posterior temporal lobe cortical infarct with hemosiderin deposition, likely associated petechial hemorrhage. Punctate foci of hemosiderin deposition in the right cerebellum, unchanged and likely the sequela of prior hypertensive microhemorrhages. Vascular: Normal flow voids. Skull and upper cervical spine: Normal marrow signal. Sinuses/Orbits: No acute finding. Status post bilateral lens replacements. Other: The mastoids are well aerated. IMPRESSION: No acute intracranial process. No evidence of acute or subacute infarct. Electronically Signed   By: Merilyn Baba M.D.   On: 01/30/2022 00:37  ? ?ECHOCARDIOGRAM LIMITED BUBBLE STUDY ? ?Result Date: 01/30/2022 ?   ECHOCARDIOGRAM LIMITED REPORT   Patient Name:   Bradley Bowers Date of Exam: 01/30/2022 Medical Rec #:  [id number]         Height:       66.0 in Accession #:    [id number]        Weight:        202.0 lb Date of Birth:  24-Mar-1946         BSA:          2.008 m? Patient Age:    75 years          BP:           120/52 mmHg Patient Gender: M                 HR:           56 bpm. Exam Location:  [facility] Procedure: Limited Echo, Limited Color Doppler, Cardiac Doppler and Saline  Contrast Bubble Study Indications:     G45.9 TIA  History:         Patient has prior history of Echocardiogram examinations. COPD;                  Risk Factors:Hypertension, Dyslipidemia and Sleep Apnea.  Sonographer:     Charmayne Sheer Referring Phys:  [id number] Diagnosing Phys: Isaias Cowman MD  Sonographer Comments: Image acquisition challenging due to COPD and Image acquisition challenging due to respiratory motion. IMPRESSIONS  1. Left ventricular ejection fraction, by estimation, is 55 to 60%. The left ventricle has normal function. The left ventricle has no regional wall motion abnormalities.  2. Right ventricular systolic function is normal. The right ventricular size is normal.  3. The mitral valve is normal in structure. Trivial mitral valve regurgitation. No evidence of mitral stenosis.  4. The aortic valve is normal in structure. Aortic valve regurgitation is not visualized. No aortic stenosis is present.  5. The inferior vena cava is normal in size with greater than 50% respiratory variability, suggesting right atrial pressure of 3 mmHg.  6. Agitated saline contrast bubble study was negative, with no evidence of any interatrial shunt. FINDINGS  Left Ventricle: Left ventricular ejection fraction, by estimation, is 55 to 60%. The left ventricle has normal function. The left ventricle has no regional wall motion abnormalities. The left ventricular internal cavity size was normal in size. There is  no left ventricular hypertrophy. Right Ventricle: The right ventricular size is normal. No increase in right ventricular wall thickness. Right ventricular systolic function is normal. Left Atrium: Left atrial size  was normal in size. Right Atrium: Right atrial size was normal in size. Pericardium: There is no evidence of pericardial effusion. Mitral Valve: The mitral valve is normal in structure. Trivial mitral valve regurgitation.

## 2022-01-31 NOTE — NC FL2 (Signed)
?[place] MEDICAID FL2 LEVEL OF CARE SCREENING TOOL  ?  ? ?IDENTIFICATION  ?Patient Name: ?Bradley Bowers Birthdate: 1946/09/28 Sex: male Admission Date (Current Location): ?01/29/2022  ?[place] and [place] Number: ? [place] ?  Facility and Address:  ?[facility], [address], [place], [place] [address] ?     Provider Number: ?[id number]  ?Attending Physician Name and Address:  ?Richarda Osmond, MD ? Relative Name and Phone Number:  ?Earnestine Tuohey- [phone] ?   ?Current Level of Care: ?Hospital Recommended Level of Care: ?[place] Prior Approval Number: ?  ? ?Date Approved/Denied: ?  PASRR Number: ?pending ? ?Discharge Plan: ?SNF ?  ? ?Current Diagnoses: ?Patient Active Problem List  ? Diagnosis Date Noted  ? Essential hypertension 01/30/2022  ? Peripheral neuropathy 01/30/2022  ? TIA (transient ischemic attack) 01/29/2022  ? Acute respiratory failure with hypoxia ([place]) 05/22/2021  ? COPD with acute exacerbation ([place]) 05/22/2021  ? Hyperlipemia 05/22/2021  ? Bipolar 1 disorder ([place]) 05/22/2021  ? Renal insufficiency 05/22/2021  ? Leg edema 05/22/2021  ? Coronary artery disease 09/24/2016  ? Glaucoma 09/24/2016  ? Bipolar affective disorder, current episode manic with psychotic symptoms ([place]) 09/23/2016  ? Chronic obstructive pulmonary disease (COPD) ([place]) 11/06/2015  ? Gastro-esophageal reflux disease without esophagitis 10/24/2014  ? Obstructive apnea 10/24/2014  ? Restless leg 10/24/2014  ? ? ?Orientation RESPIRATION BLADDER Height & Weight   ?  ?Self, Time, Situation, Place ? O2 (Chronic O2 2L) Continent Weight: 91.6 kg ?Height:  '5\' 6"'$  (167.6 cm)  ?BEHAVIORAL SYMPTOMS/MOOD NEUROLOGICAL BOWEL NUTRITION STATUS  ?    Continent Diet (regular)  ?AMBULATORY STATUS COMMUNICATION OF NEEDS Skin   ?Limited Assist Verbally Normal ?  ?  ?  ?    ?     ?     ? ? ?Personal Care Assistance Level of Assistance  ?Bathing, Feeding, Dressing Bathing Assistance: Limited  assistance ?Feeding assistance: Independent ?Dressing Assistance: Limited assistance ?   ? ?Functional Limitations Info  ?Sight, Hearing, Speech Sight Info: Adequate ?Hearing Info: Adequate ?Speech Info: Adequate  ? ? ?SPECIAL CARE FACTORS FREQUENCY  ?PT (By licensed PT), OT (By licensed OT)   ?  ?PT Frequency: 5 times per week ?OT Frequency: 5 times per week ?  ?  ?  ?   ? ? ?Contractures Contractures Info: Not present  ? ? ?Additional Factors Info  ?Code Status, Allergies Code Status Info: DNR ?Allergies Info: NKA ?  ?  ?  ?   ? ?Current Medications (01/31/2022):  This is the current hospital active medication list ?Current Facility-Administered Medications  ?Medication Dose Route Frequency Provider Last Rate Last Admin  ?  stroke: early stages of recovery book   Does not apply Once Mansy, Jan A, MD      ? 0.9 %  sodium chloride infusion   Intravenous Continuous Mansy, Arvella Merles, MD   Stopped at 01/30/22 1625  ? acetaminophen (TYLENOL) tablet 650 mg  650 mg Oral Q4H PRN Mansy, Jan A, MD      ? Or  ? acetaminophen (TYLENOL) 160 MG/5ML solution 650 mg  650 mg Per Tube Q4H PRN Mansy, Arvella Merles, MD      ? Or  ? acetaminophen (TYLENOL) suppository 650 mg  650 mg Rectal Q4H PRN Mansy, Jan A, MD      ? albuterol (PROVENTIL) (2.5 MG/3ML) 0.083% nebulizer solution 2.5 mg  2.5 mg Inhalation Q4H PRN Mansy, Arvella Merles, MD      ?  aspirin EC tablet 81 mg  81 mg Oral Daily Mansy, Jan A, MD   81 mg at 01/31/22 0848  ? atenolol (TENORMIN) tablet 50 mg  50 mg Oral Daily Sharion Settler, NP   50 mg at 01/30/22 1044  ? atorvastatin (LIPITOR) tablet 40 mg  40 mg Oral Daily Mansy, Jan A, MD   40 mg at 01/31/22 0848  ? carbamazepine (TEGRETOL) tablet 200 mg  200 mg Oral BID Mansy, Jan A, MD   200 mg at 01/31/22 0848  ? clopidogrel (PLAVIX) tablet 75 mg  75 mg Oral Daily Mansy, Jan A, MD   75 mg at 01/31/22 0848  ? divalproex (DEPAKOTE ER) 24 hr tablet 1,500 mg  1,500 mg Oral QHS Mansy, Jan A, MD   1,500 mg at 01/30/22 2229  ? donepezil (ARICEPT)  tablet 5 mg  5 mg Oral QHS Mansy, Jan A, MD   5 mg at 01/30/22 2229  ? enoxaparin (LOVENOX) injection 45 mg  0.5 mg/kg Subcutaneous Q24H Mansy, Jan A, MD   45 mg at 01/30/22 2232  ? fluticasone (FLONASE) 50 MCG/ACT nasal spray 1 spray  1 spray Each Nare Daily PRN Mansy, Jan A, MD      ? hydrochlorothiazide (HYDRODIURIL) tablet 25 mg  25 mg Oral Daily Mansy, Jan A, MD   25 mg at 01/31/22 0848  ? lisinopril (ZESTRIL) tablet 5 mg  5 mg Oral Daily Mansy, Jan A, MD   5 mg at 01/31/22 0848  ? OLANZapine (ZYPREXA) tablet 5 mg  5 mg Oral QHS Mansy, Jan A, MD   5 mg at 01/30/22 2229  ? vitamin B-12 (CYANOCOBALAMIN) tablet 1,000 mcg  1,000 mcg Oral Daily Mansy, Jan A, MD   1,000 mcg at 01/30/22 1044  ? ?Current Outpatient Medications  ?Medication Sig Dispense Refill  ? ADVAIR DISKUS 100-50 MCG/ACT AEPB Inhale 1 puff into the lungs 2 (two) times daily.    ? albuterol (VENTOLIN HFA) 108 (90 Base) MCG/ACT inhaler Inhale 1-2 puffs into the lungs every 4 (four) hours as needed.    ? aspirin 81 MG EC tablet Take 81 mg by mouth daily.  11  ? atenolol (TENORMIN) 50 MG tablet Take 50 mg by mouth daily.    ? carbamazepine (TEGRETOL) 200 MG tablet Take 1 tablet by mouth 2 (two) times daily.    ? divalproex (DEPAKOTE ER) 500 MG 24 hr tablet Take 3 tablets (1,500 mg total) by mouth at bedtime. 90 tablet 1  ? donepezil (ARICEPT) 5 MG tablet Take 10 mg by mouth at bedtime.    ? fluticasone furoate-vilanterol (BREO ELLIPTA) 100-25 MCG/INH AEPB Inhale 1 puff into the lungs daily.    ? hydrochlorothiazide (HYDRODIURIL) 25 MG tablet Take 1 tablet by mouth.    ? lisinopril (ZESTRIL) 5 MG tablet Take 1 tablet by mouth daily.    ? montelukast (SINGULAIR) 10 MG tablet Take 10 mg by mouth at bedtime.    ? OLANZapine (ZYPREXA) 5 MG tablet Take 5 mg by mouth at bedtime.    ? timolol (TIMOPTIC) 0.5 % ophthalmic solution 1 drop 2 (two) times daily.    ? tiotropium (SPIRIVA) 18 MCG inhalation capsule Place 18 mcg into inhaler and inhale daily.    ?  vitamin B-12 1000 MCG tablet Take 1 tablet (1,000 mcg total) by mouth daily. 30 tablet 1  ? atorvastatin (LIPITOR) 80 MG tablet Take 1 tablet (80 mg total) by mouth daily. 30 tablet 0  ? clopidogrel (PLAVIX) 75  MG tablet Take 1 tablet (75 mg total) by mouth daily. 30 tablet 0  ? fluticasone (FLONASE) 50 MCG/ACT nasal spray Place 1 spray into both nostrils daily.    ? ? ? ?Discharge Medications: ?Please see discharge summary for a list of discharge medications. ? ?Relevant Imaging Results: ? ?Relevant Lab Results: ? ? ?Additional Information ?SS# [id number] ? ?Shelbie Hutching, RN ? ? ? ? ?

## 2022-01-31 NOTE — Progress Notes (Signed)
Physical Therapy Treatment ?Patient Details ?Name: Bradley Bowers ?MRN: [id number] ?DOB: 06/01/1946 ?Today's Date: 01/31/2022 ? ? ?History of Present Illness Patient is a 76 year old with a PMH (+) for asthma, COPD on 2 L nasal cannula chronically, HTN, HDL, CAD, GERD and OSA who presented to [facility] for generalized weakness and stroke like symptoms. Imaging negative for acute findings. ? ?  ?PT Comments  ? ? Patient received in bed, he is agreeable to PT session. Wife present in room. He reports he needs to go to the bathroom, call light on. Patient is mod independent with bed mobility. Transfers sit to stand with supervision. Able to stand to use urinal without difficulty. Then ambulated in room with RW and lob to right requiring max assist to prevent fall. Continued to ambulate for a total of 15 feet with RW and min A. Unsteady at times. Patient is unsteady even with RW. Did not use AD prior to admission. He will continue to benefit from skilled PT while here to improve safety, balance and strength for safe return home.  ?       ?Recommendations for follow up therapy are one component of a multi-disciplinary discharge planning process, led by the attending physician.  Recommendations may be updated based on patient status, additional functional criteria and insurance authorization. ? ?Follow Up Recommendations ? Skilled nursing-short term rehab (<3 hours/day) ?  ?  ?Assistance Recommended at Discharge Frequent or constant Supervision/Assistance  ?Patient can return home with the following A lot of help with walking and/or transfers;A little help with bathing/dressing/bathroom ?  ?Equipment Recommendations ? Rolling walker (2 wheels)  ?  ?Recommendations for Other Services   ? ? ?  ?Precautions / Restrictions Precautions ?Precautions: Fall ?Restrictions ?Weight Bearing Restrictions: No  ?  ? ?Mobility ? Bed Mobility ?Overal bed mobility: Modified Independent ?Bed Mobility: Supine to Sit, Sit to Supine ?  ?  ?Supine  to sit: Modified independent (Device/Increase time), HOB elevated ?Sit to supine: Modified independent (Device/Increase time) ?  ?  ?  ? ?Transfers ?Overall transfer level: Needs assistance ?Equipment used: Rolling walker (2 wheels) ?Transfers: Sit to/from Stand ?Sit to Stand: Supervision ?  ?  ?  ?  ?  ?General transfer comment: able to stand without UE support to use urinal at beginning of session. ?  ? ?Ambulation/Gait ?Ambulation/Gait assistance: Min guard, Max assist ?Gait Distance (Feet): 15 Feet ?Assistive device: Rolling walker (2 wheels) ?Gait Pattern/deviations: Step-through pattern, Decreased step length - right, Decreased step length - left ?Gait velocity: decreased ?  ?  ?General Gait Details: patient ambulated to door of room and had lob to his right requiring max assist to prevent fall. once he regained balance he ambulated to nursing desk with min A and back to room but continued to have small lob during session. Ambulated some at edge of bed without AD and min assist and was unsteady, again losing balance to his right. Patient reports feeling a bit dizzy. HR in 30s, oxygen saturation stable but dropped to 90 after mobility. ? ? ?Stairs ?  ?  ?  ?  ?  ? ? ?Wheelchair Mobility ?  ? ?Modified Rankin (Stroke Patients Only) ?  ? ? ?  ?Balance Overall balance assessment: Needs assistance ?Sitting-balance support: Feet supported ?Sitting balance-[name]: Good ?Sitting balance - Comments: States he felt a bit unsteady when he sat back down after using urinal ?  ?Standing balance support: Bilateral upper extremity supported, During functional activity, Reliant on assistive  device for balance ?Standing balance-[name]: Poor ?Standing balance comment: LOB with ambulation using walker ?  ?  ?  ?  ?  ?  ?  ?  ?  ?  ?  ?  ? ?  ?Cognition Arousal/Alertness: Awake/alert ?Behavior During Therapy: [facility] for tasks assessed/performed ?Overall Cognitive Status: Within Functional Limits for tasks assessed ?  ?  ?   ?  ?  ?  ?  ?  ?  ?  ?  ?  ?  ?  ?  ?  ?  ?  ?  ? ?  ?Exercises   ? ?  ?General Comments   ?  ?  ? ?Pertinent Vitals/Pain Pain Assessment ?Pain Assessment: No/denies pain  ? ? ?Home Living   ?  ?  ?  ?  ?  ?  ?  ?  ?  ?   ?  ?Prior Function    ?  ?  ?   ? ?PT Goals (current goals can now be found in the care plan section) Acute Rehab PT Goals ?Patient Stated Goal: to get better ?PT Goal Formulation: With patient/family ?Time For Goal Achievement: 02/13/22 ?Potential to Achieve Goals: Good ?Progress towards PT goals: Progressing toward goals ? ?  ?Frequency ? ? ? Min 2X/week ? ? ? ?  ?PT Plan Discharge plan needs to be updated  ? ? ?Co-evaluation   ?  ?  ?  ?  ? ?  ?AM-PAC PT "6 Clicks" Mobility   ?Outcome Measure ? Help needed turning from your back to your side while in a flat bed without using bedrails?: None ?Help needed moving from lying on your back to sitting on the side of a flat bed without using bedrails?: None ?Help needed moving to and from a bed to a chair (including a wheelchair)?: A Little ?Help needed standing up from a chair using your arms (e.g., wheelchair or bedside chair)?: A Little ?Help needed to walk in hospital room?: A Lot ?Help needed climbing 3-5 steps with a railing? : A Lot ?6 Click Score: 18 ? ?  ?End of Session Equipment Utilized During Treatment: Gait belt;Oxygen ?Activity Tolerance: Patient tolerated treatment well;Patient limited by fatigue ?Patient left: in bed;with bed alarm set;with call bell/phone within reach;with family/visitor present ?Nurse Communication: Mobility status ?PT Visit Diagnosis: Unsteadiness on feet (R26.81);Muscle weakness (generalized) (M62.81);Other symptoms and signs involving the nervous system (R29.898);Difficulty in walking, not elsewhere classified (R26.2) ?  ? ? ?Time: 3888-2800 ?PT Time Calculation (min) (ACUTE ONLY): 21 min ? ?Charges:  $Gait Training: 8-22 mins          ?          ? ?Amanda Cockayne, PT, GCS ?01/31/22,10:07 AM ? ? ?

## 2022-01-31 NOTE — TOC Progression Note (Signed)
Transition of Care (TOC) - Progression Note  ? ? ?Patient Details  ?Name: Bradley Bowers ?MRN: [id number] ?Date of Birth: 1945-10-15 ? ?Transition of Care (TOC) CM/SW Contact  ?Brennyn Ortlieb A Saloma Cadena, LCSW ?Phone Number: ?01/31/2022, 3:44 PM ? ?Clinical Narrative:  CSW provided bed offers to Spouse. Spouse chooses [facility]. Pt has regular [employer]. CSW has notified Tammy with [facility] to start auth as it is SNF driven.  ? ? ? ?Expected Discharge Plan: [place] ?Barriers to Discharge: Barriers Resolved ? ?Expected Discharge Plan and Services ?Expected Discharge Plan: [place] ?  ?Discharge Planning Services: CM Consult ?  ?Living arrangements for the past 2 months: [place] ?Expected Discharge Date: 01/30/22               ?DME Arranged: N/A ?DME Agency: NA ?  ?  ?  ?HH Arranged: PT, OT ?[place] Agency: [facility] ?Date HH Agency Contacted: 01/30/22 ?Time [place]: 1506 ?Representative spoke with at [place]: Merleen Nicely ? ? ?Social Determinants of Health (SDOH) Interventions ?  ? ?Readmission Risk Interventions ?   ? View : No data to display.  ?  ?  ?  ? ? ?

## 2022-01-31 NOTE — Progress Notes (Signed)
To whom it may concern: ? ?Please be advised that the above named patient will requiring a short term nursing home stay - anticipated 30 days or less, for strengthening and rehabilitation.   ?

## 2022-02-01 DIAGNOSIS — R531 Weakness: Secondary | ICD-10-CM | POA: Diagnosis not present

## 2022-02-01 DIAGNOSIS — R600 Localized edema: Secondary | ICD-10-CM | POA: Diagnosis not present

## 2022-02-01 DIAGNOSIS — I10 Essential (primary) hypertension: Secondary | ICD-10-CM | POA: Diagnosis not present

## 2022-02-01 DIAGNOSIS — J438 Other emphysema: Secondary | ICD-10-CM | POA: Diagnosis not present

## 2022-02-01 DIAGNOSIS — I639 Cerebral infarction, unspecified: Secondary | ICD-10-CM | POA: Diagnosis not present

## 2022-02-01 DIAGNOSIS — F319 Bipolar disorder, unspecified: Secondary | ICD-10-CM | POA: Diagnosis not present

## 2022-02-01 MED ORDER — ATORVASTATIN CALCIUM 20 MG PO TABS
80.0000 mg | ORAL_TABLET | Freq: Every day | ORAL | Status: DC
  Administered 2022-02-01 – 2022-02-04 (×4): 80 mg via ORAL
  Filled 2022-02-01 (×4): qty 4

## 2022-02-01 NOTE — Progress Notes (Signed)
Physical Therapy Treatment ?Patient Details ?Name: Bradley Bowers ?MRN: [id number] ?DOB: 1946-01-25 ?Today's Date: 02/01/2022 ? ? ?History of Present Illness Patient is a 76 year old with a PMH (+) for asthma, COPD on 2 L nasal cannula chronically, HTN, HDL, CAD, GERD and OSA who presented to [facility] for generalized weakness and stroke like symptoms. Imaging negative for acute findings. ? ?  ?PT Comments  ? ? Pt was pleasant and motivated to participate during the session and put forth good effort throughout. Pt presented with notable asymmetry in LE strength during therex with RLE weaker than the LLE.  Pt required cuing for safe sequencing with transfers and gait along with occasional min A to address instability.  Pt reported no adverse symptoms during the session with SpO2 and HR WNL on 2LO2/min.  Pt is at a very high risk for falls and would not be safe to return to his prior living situation at this time.  Pt will benefit from PT services in a SNF setting upon discharge to safely address deficits listed in patient problem list for decreased caregiver assistance and eventual return to PLOF. ? ?   ?Recommendations for follow up therapy are one component of a multi-disciplinary discharge planning process, led by the attending physician.  Recommendations may be updated based on patient status, additional functional criteria and insurance authorization. ? ?Follow Up Recommendations ? Skilled nursing-short term rehab (<3 hours/day) ?  ?  ?Assistance Recommended at Discharge Frequent or constant Supervision/Assistance  ?Patient can return home with the following A lot of help with walking and/or transfers;A little help with bathing/dressing/bathroom;Help with stairs or ramp for entrance;Assist for transportation;Assistance with cooking/housework ?  ?Equipment Recommendations ? Rolling walker (2 wheels)  ?  ?Recommendations for Other Services   ? ? ?  ?Precautions / Restrictions Precautions ?Precautions:  Fall ?Restrictions ?Weight Bearing Restrictions: No  ?  ? ?Mobility ? Bed Mobility ?Overal bed mobility: Modified Independent ?  ?  ?  ?  ?  ?  ?General bed mobility comments: HOB elevated with min extra time and effort with bed mobility tasks ?  ? ?Transfers ?Overall transfer level: Needs assistance ?Equipment used: Rolling walker (2 wheels) ?Transfers: Sit to/from Stand ?Sit to Stand: Min guard ?  ?  ?  ?  ?  ?General transfer comment: Min verbal cues for sequencing with good eccentric and concentric control ?  ? ?Ambulation/Gait ?Ambulation/Gait assistance: Min assist ?Gait Distance (Feet): 50 Feet ?Assistive device: Rolling walker (2 wheels) ?Gait Pattern/deviations: Step-through pattern, Decreased step length - right, Decreased step length - left ?Gait velocity: decreased ?  ?  ?General Gait Details: Slow cadence with 1-2 occasions of min A needed for stability and mod verbal cues for general sequencing with the RW ? ? ?Stairs ?  ?  ?  ?  ?  ? ? ?Wheelchair Mobility ?  ? ?Modified Rankin (Stroke Patients Only) ?  ? ? ?  ?Balance Overall balance assessment: Needs assistance ?Sitting-balance support: Feet supported ?Sitting balance-[name]: Good ?  ?  ?Standing balance support: Bilateral upper extremity supported, During functional activity, Reliant on assistive device for balance ?Standing balance-[name]: Poor ?  ?  ?  ?  ?  ?  ?  ?  ?  ?  ?  ?  ?  ? ?  ?Cognition Arousal/Alertness: Awake/alert ?Behavior During Therapy: Impulsive ?Overall Cognitive Status: Within Functional Limits for tasks assessed ?  ?  ?  ?  ?  ?  ?  ?  ?  ?  ?  ?  ?  ?  ?  ?  ?  General Comments: Pt requires frequent vcs for safety, mildly impulsive ?  ?  ? ?  ?Exercises Total Joint Exercises ?Hip ABduction/ADduction: AROM, Strengthening, Both, 10 reps (with manual resistance) ?Straight Leg Raises: Strengthening, Both, 10 reps ?Long Arc Quad: Strengthening, Both, 10 reps (with manual resistance) ?Knee Flexion: Strengthening, Both, 10  reps (with manual resistance) ?Marching in Standing: AROM, Strengthening, Both, 5 reps, Standing ?Other Exercises ?Other Exercises: Static and dynamic standing balance training without UE support including reaching outside BOS ? ?  ?General Comments   ?  ?  ? ?Pertinent Vitals/Pain Pain Assessment ?Pain Assessment: No/denies pain  ? ? ?Home Living   ?  ?  ?  ?  ?  ?  ?  ?  ?  ?   ?  ?Prior Function    ?  ?  ?   ? ?PT Goals (current goals can now be found in the care plan section) Progress towards PT goals: Progressing toward goals ? ?  ?Frequency ? ? ? Min 2X/week ? ? ? ?  ?PT Plan Current plan remains appropriate  ? ? ?Co-evaluation   ?  ?  ?  ?  ? ?  ?AM-PAC PT "6 Clicks" Mobility   ?Outcome Measure ? Help needed turning from your back to your side while in a flat bed without using bedrails?: None ?Help needed moving from lying on your back to sitting on the side of a flat bed without using bedrails?: None ?Help needed moving to and from a bed to a chair (including a wheelchair)?: A Little ?Help needed standing up from a chair using your arms (e.g., wheelchair or bedside chair)?: A Little ?Help needed to walk in hospital room?: A Lot ?Help needed climbing 3-5 steps with a railing? : A Lot ?6 Click Score: 18 ? ?  ?End of Session Equipment Utilized During Treatment: Gait belt;Oxygen ?Activity Tolerance: Patient tolerated treatment well ?Patient left: in chair;with call bell/phone within reach;with chair alarm set ?Nurse Communication: Mobility status ?PT Visit Diagnosis: Unsteadiness on feet (R26.81);Muscle weakness (generalized) (M62.81);Other symptoms and signs involving the nervous system (R29.898);Difficulty in walking, not elsewhere classified (R26.2) ?  ? ? ?Time: 1941-7408 ?PT Time Calculation (min) (ACUTE ONLY): 38 min ? ?Charges:  $Gait Training: 8-22 mins ?$Therapeutic Exercise: 8-22 mins ?$Therapeutic Activity: 8-22 mins          ?          ? ?D. Royetta Asal PT, DPT ?02/01/22, 4:12 PM ? ? ?

## 2022-02-01 NOTE — Progress Notes (Signed)
Occupational Therapy Treatment ?Patient Details ?Name: Bradley Bowers ?MRN: [id number] ?DOB: [birth year] ?Today's Date: 02/01/2022 ? ? ?History of present illness Patient is a 76 year old with a PMH (+) for asthma, COPD on 2 L nasal cannula chronically, HTN, HDL, CAD, GERD and OSA who presented to [name] for generalized weakness and stroke like symptoms. Imaging negative for acute findings. ?  ?OT comments ? Pt. Required CGA to stand, and use the urinal at the Bedside. CGA standing to perform self-grooming at the sinkside. Pt. presents with no LOB, or posterior leaning, however reports RLE feels weak. Pt. Required modA to cross his left leg over his right leg to donn his sock while sitting unsupported. Pt. reports feeling unsteady during the task, however no LOB noted. Pt. Worked on activities to challenge standing balance for ADL, and IADL tasks.  Pt. SpO2 85-93% on 2LO2. HR 71 bpms. Pt. Education was provided about PLB techniques, and energy conservation. Pt. Continues to benefit from OT services for ADL training, A/E training, neuro-muscular re-education, and pt. education about home modification, and DME.  Follow-up OT services are recommended at SNF level of care.   ? ?Recommendations for follow up therapy are one component of a multi-disciplinary discharge planning process, led by the attending physician.  Recommendations may be updated based on patient status, additional functional criteria and insurance authorization. ?   ?Follow Up Recommendations ? Skilled nursing-short term rehab (<3 hours/day)  ?  ?Assistance Recommended at Discharge    ?Patient can return home with the following ? A little help with walking and/or transfers;A little help with bathing/dressing/bathroom;Assistance with cooking/housework;Assist for transportation;Help with stairs or ramp for entrance ?  ?Equipment Recommendations ?    ?  ?Recommendations for Other Services   ? ?  ?Precautions / Restrictions Precautions ?Precautions:  Fall ?Restrictions ?Weight Bearing Restrictions: No  ? ? ?  ? ?Mobility Bed Mobility ?Overal bed mobility: Modified Independent ?Bed Mobility: Supine to Sit, Sit to Supine ?  ?  ?Supine to sit: Modified independent (Device/Increase time) ?  ?  ?  ?  ? ?Transfers ?  ?  ?  ?Sit to Stand: Supervision ?  ?  ?  ?  ?  ?  ?  ?  ?Balance  Fair unsupported dynamic unsupported sitting, and standing balance ?  ?  ?  ?  ?  ?  ?  ?  ?  ?  ?  ?  ?  ?  ?  ?  ?  ?  ?   ? ?ADL either performed or assessed with clinical judgement  ? ?ADL Overall ADL's : Needs assistance/impaired ?  ?  ?Grooming: Min guard ?  ?  ?  ?  ?  ?  ?  ?Lower Body Dressing: Moderate assistance;Minimal assistance ?  ?  ?  ?Toileting- Water quality scientist and Hygiene: Min guard ?  ?  ?  ?  ?  ?  ? ?Extremity/Trunk Assessment Upper Extremity Assessment ?Upper Extremity Assessment: Generalized weakness ?  ?  ?  ?  ?  ? ?Vision Patient Visual Report: No change from baseline ?  ?  ?Perception   ?  ?Praxis   ?  ? ?Cognition   ?  ?  ?  ?  ?  ?  ?  ?  ?  ?  ?  ?  ?  ?  ?  ?  ?  ?  ?  ?  ?  ?   ?Exercises   ? ?  ?  Shoulder Instructions   ? ? ?  ?General Comments    ? ? ?Pertinent Vitals/ Pain       Pain Assessment ?Pain Assessment: No/denies pain ? ?Home Living   ?  ?  ?  ?  ?  ?  ?  ?  ?  ?  ?  ?  ?  ?  ?  ?  ?  ?  ? ?  ?Prior Functioning/Environment    ?  ?  ?  ?   ? ?Frequency ? Min 2X/week  ? ? ? ? ?  ?Progress Toward Goals ? ?OT Goals(current goals can now be found in the care plan section) ? Progress towards OT goals: Progressing toward goals ? ?Acute Rehab OT Goals ?Patient Stated Goal: To get stronger, and return home ?OT Goal Formulation: With patient ?Time For Goal Achievement: 02/14/22 ?Potential to Achieve Goals: Good  ?Plan Discharge plan remains appropriate   ? ?Co-evaluation ? ? ?   ?  ?  ?  ?  ? ?  ?AM-PAC OT "6 Clicks" Daily Activity     ?Outcome Measure ? ? Help from another person eating meals?: None ?Help from another person taking care of personal  grooming?: A Little ?Help from another person toileting, which includes using toliet, bedpan, or urinal?: None ?Help from another person bathing (including washing, rinsing, drying)?: A Little ?Help from another person to put on and taking off regular upper body clothing?: None ?Help from another person to put on and taking off regular lower body clothing?: A Little ?6 Click Score: 21 ? ?  ?End of Session Equipment Utilized During Treatment: Gait belt;Rolling walker (2 wheels) ? ?OT Visit Diagnosis: Other abnormalities of gait and mobility (R26.89);Hemiplegia and hemiparesis ?Hemiplegia - dominant/non-dominant: Non-Dominant ?  ?Activity Tolerance Patient tolerated treatment well ?  ?Patient Left in bed;with call bell/phone within reach;with family/visitor present;with bed alarm set ?  ?Nurse Communication Mobility status ?  ? ?   ? ?Time: 9211-9417 ?OT Time Calculation (min): 28 min ? ?Charges: OT General Charges ?$OT Visit: 1 Visit ?OT Treatments ?$Self Care/Home Management : 23-37 mins ? ?Harrel Carina, MS, OTR/L  ? ?Harrel Carina ?02/01/2022, 2:59 PM ?

## 2022-02-01 NOTE — Progress Notes (Signed)
Went to round on patient, he was in bed bleeding from his right elbow. He said that he hit his elbow on the bedside table after he finished using the urinal .Patient stated that he did not hit the ground or his head. Charge nurse aware. Will continue to monitor.  ?

## 2022-02-01 NOTE — Progress Notes (Addendum)
?PROGRESS NOTE ? ?Bradley Bowers    DOB: Nov 04, 1945, 76 y.o.  ?RAQ:[id number]  ?  Code Status: DNR   ?DOA: 01/29/2022   LOS: 1  ? ?Brief hospital course  ?Bradley Bowers is a 76 y.o. male with a PMH significant for dyslipidemia, CAD, COPD, on home O2 at 2 L/min nightly, PAD and obstructive sleep apnea. ?They presented from home to the ED on 01/29/2022 with slurred speech and right sided extremity weakness. ?In the ED, they had CT of the head and neck with no acute intracranial process and no hemodynamically, significant stenosis in the neck and no intracranial large vessel occlusion or significant stenosis. ?Brain MRI without contrast revealed no evidence for acute intracranial process. ?Neurosurgery was consulted and recommended stroke work up. ?They were treated with 300 mg p.o. Plavix and will be continued on his baby aspirin.    ?Patient was admitted to medicine service for further workup and management of TIA as outlined in detail below. ? ?02/01/22 -stable, improved ? ?Assessment & Plan  ?Principal Problem: ?  TIA (transient ischemic attack) ?Active Problems: ?  Bipolar 1 disorder ([place]) ?  Chronic obstructive pulmonary disease (COPD) ([place]) ?  Essential hypertension ?  Peripheral neuropathy ?  CVA (cerebral vascular accident) [facility]) ? ?CVA-  no acute signs of infarction on brain imaging this admission. Some subacute changes and chronic atrophy. patient now stable and recommended to go to SNF at discharge. He and his wife describe about 6 months of dizziness and balance issues. He was seeing Dr. Manuella Ghazi outpatient who had initiated evaluation for Parkinson's disease which could also be contributing to his presenting symptoms.  ?Patient had a fall out of bed last night and obtained a laceration to his right elbow. Hemostasis this morning.  ?- TOC consulted for SNF placement ?- ASA '81mg'$  + plavix x21 days and then plavix alone ?- continue statin '80mg'$  ?- PT/OT ?- re-consulted neurology for further evaluation, Dr.  Quinn Axe agreed to see again. Appreciate recommendations ?- fall precautions ?  ?Bipolar 1 disorder ([place]) ?- continue Tegretol and Depakote ER as well as Zyprexa. ?  ?COPD- ([place]) ?- continue albuterol while holding off his Advair discus. ?- continue Singulair. ?  ?Peripheral neuropathy ?-  continue Neurontin. ?  ?Essential hypertension ?- continue his antihypertensives  ?  ? ?Body mass index is 32.6 kg/m?. ? ?VTE ppx: lovenox ? ?Diet:  ?   ?Diet  ? Diet regular Room service appropriate? Yes; Fluid consistency: Thin  ? ?Subjective 02/01/22   ? ?Pt reports feeling horrible. Unable to express what feels horrible. States that he doesn't think he's ready for rehab or that he'll ever get better.  ?Wife is upset that his bed alarm wasn't on so he fell out of bed last night. She is also upset that our initial evaluation was to go home with [facility].  ?I addressed all of their concerns over an extended period of time and answered all their questions. ?  ?Objective  ? ?Vitals:  ? 01/31/22 1145 01/31/22 1506 01/31/22 2054 02/01/22 0534  ?BP:  (!) 142/49 (!) 162/74 (!) 156/77  ?Pulse: 66 64 69 63  ?Resp: '15 16 19 19  '$ ?Temp:  97.7 ?F (36.5 ?C) (!) 97.5 ?F (36.4 ?C) (!) 97.5 ?F (36.4 ?C)  ?TempSrc:  Oral Oral   ?SpO2: 95% 97% 97% 98%  ?Weight:      ?Height:      ? ? ?Intake/Output Summary (Last 24 hours) at 02/01/2022 0746 ?Last data filed at  02/01/2022 0500 ?Gross per 24 hour  ?Intake 200 ml  ?Output 850 ml  ?Net -650 ml  ? ? ?Filed Weights  ? 01/29/22 2006  ?Weight: 91.6 kg  ?  ? ?Physical Exam:  ?General: awake, alert, NAD ?HEENT: atraumatic, clear conjunctiva, anicteric sclera, MMM, hearing grossly normal ?Respiratory: normal respiratory effort. ?Cardiovascular: quick capillary refill, normal S1/S2, RRR, no JVD, murmurs ?Nervous: A&O x3. normal speech. Mild droop to left mouth. Mild increased weakness of right leg.  ?Extremities: moves all equally, no edema, normal tone ?Skin: dry, intact, normal temperature, normal color. No rashes,  lesions or ulcers on exposed skin ?Psychiatry:anxious mood ? ?Labs   ?I have personally reviewed the following labs and imaging studies ?CBC ?   ?Component Value Date/Time  ? WBC 9.0 01/29/2022 2102  ? RBC 4.94 01/29/2022 2102  ? HGB 14.5 01/29/2022 2102  ? HCT 45.4 01/29/2022 2102  ? PLT 144 (L) 01/29/2022 2102  ? MCV 91.9 01/29/2022 2102  ? MCH 29.4 01/29/2022 2102  ? MCHC 31.9 01/29/2022 2102  ? RDW 13.8 01/29/2022 2102  ? LYMPHSABS 2.0 01/29/2022 2102  ? MONOABS 0.7 01/29/2022 2102  ? EOSABS 0.4 01/29/2022 2102  ? BASOSABS 0.0 01/29/2022 2102  ? ? ?  Latest Ref Rng & Units 01/29/2022  ?  9:02 PM 05/25/2021  ?  5:17 AM 05/24/2021  ?  4:07 AM  ?BMP  ?Glucose 70 - 99 mg/dL 95   89   95    ?BUN 8 - 23 mg/dL 20   33   30    ?Creatinine 0.61 - 1.24 mg/dL 1.14   1.27   1.28    ?Sodium 135 - 145 mmol/L 139   140   140    ?Potassium 3.5 - 5.1 mmol/L 4.4   4.7   3.9    ?Chloride 98 - 111 mmol/L 102   96   96    ?CO2 22 - 32 mmol/L 30   34   33    ?Calcium 8.9 - 10.3 mg/dL 8.4   8.9   8.7    ? ? ?ECHOCARDIOGRAM LIMITED BUBBLE STUDY ? ?Result Date: 01/30/2022 ?   ECHOCARDIOGRAM LIMITED REPORT   Patient Name:   Bradley Bowers Date of Exam: 01/30/2022 Medical Rec #:  [id number]         Height:       66.0 in Accession #:    [id number]        Weight:       202.0 lb Date of Birth:  June 27, 1946         BSA:          2.008 m? Patient Age:    76 years          BP:           120/52 mmHg Patient Gender: M                 HR:           56 bpm. Exam Location:  [facility] Procedure: Limited Echo, Limited Color Doppler, Cardiac Doppler and Saline            Contrast Bubble Study Indications:     G45.9 TIA  History:         Patient has prior history of Echocardiogram examinations. COPD;                  Risk Factors:Hypertension, Dyslipidemia and Sleep Apnea.  Sonographer:  Charmayne Sheer Referring Phys:  [id number] [place] Diagnosing Phys: Isaias Cowman MD  Sonographer Comments: Image acquisition challenging due to COPD and Image acquisition  challenging due to respiratory motion. IMPRESSIONS  1. Left ventricular ejection fraction, by estimation, is 55 to 60%. The left ventricle has normal function. The left ventricle has no regional wall motion abnormalities.  2. Right ventricular systolic function is normal. The right ventricular size is normal.  3. The mitral valve is normal in structure. Trivial mitral valve regurgitation. No evidence of mitral stenosis.  4. The aortic valve is normal in structure. Aortic valve regurgitation is not visualized. No aortic stenosis is present.  5. The inferior vena cava is normal in size with greater than 50% respiratory variability, suggesting right atrial pressure of 3 mmHg.  6. Agitated saline contrast bubble study was negative, with no evidence of any interatrial shunt. FINDINGS  Left Ventricle: Left ventricular ejection fraction, by estimation, is 55 to 60%. The left ventricle has normal function. The left ventricle has no regional wall motion abnormalities. The left ventricular internal cavity size was normal in size. There is  no left ventricular hypertrophy. Right Ventricle: The right ventricular size is normal. No increase in right ventricular wall thickness. Right ventricular systolic function is normal. Left Atrium: Left atrial size was normal in size. Right Atrium: Right atrial size was normal in size. Pericardium: There is no evidence of pericardial effusion. Mitral Valve: The mitral valve is normal in structure. Trivial mitral valve regurgitation. No evidence of mitral valve stenosis. Tricuspid Valve: The tricuspid valve is normal in structure. Tricuspid valve regurgitation is trivial. No evidence of tricuspid stenosis. Aortic Valve: The aortic valve is normal in structure. Aortic valve regurgitation is not visualized. No aortic stenosis is present. Pulmonic Valve: The pulmonic valve was normal in structure. Pulmonic valve regurgitation is not visualized. No evidence of pulmonic stenosis. Aorta: The aortic  root is normal in size and structure. Venous: The inferior vena cava is normal in size with greater than 50% respiratory variability, suggesting right atrial pressure of 3 mmHg. IAS/Shunts: No atrial leve

## 2022-02-01 NOTE — Care Management CC44 (Signed)
Condition Code 44 Documentation Completed ? ?Patient Details  ?Name: Bradley Bowers ?MRN: [id number] ?Date of Birth: 1945-12-25 ? ? ?Condition Code 44 given:  Yes ?Patient signature on Condition Code 44 notice:  Yes ?Documentation of 2 MD's agreement:  Yes ?Code 44 added to claim:  Yes ? ? ? ?Conception Oms, RN ?02/01/2022, 8:34 AM ? ?

## 2022-02-01 NOTE — TOC Progression Note (Addendum)
Transition of Care (TOC) - Progression Note  ? ? ?Patient Details  ?Name: Bradley Bowers ?MRN: [id number] ?Date of Birth: [birth year] ? ?Transition of Care (TOC) CM/SW Contact  ?Verdun Rackley E Koki Buxton, LCSW ?Phone Number: ?02/01/2022, 9:06 AM ? ?Clinical Narrative:   CSW asked Tammy with [facility] for update when they get insurance auth.  ? ?11:05- Per Lynelle Smoke, [name] is still pending. Left VM for patient's spouse with update. ? ? ? ?Expected Discharge Plan: [place] ?Barriers to Discharge: Barriers Resolved ? ?Expected Discharge Plan and Services ?Expected Discharge Plan: [place] ?  ?Discharge Planning Services: CM Consult ?  ?Living arrangements for the past 2 months: [place] ?Expected Discharge Date: 01/30/22               ?DME Arranged: N/A ?DME Agency: NA ?  ?  ?  ?HH Arranged: PT, OT ?[place] Agency: [facility] ?Date HH Agency Contacted: 01/30/22 ?Time [place]: 1506 ?Representative spoke with at [place]: Merleen Nicely ? ? ?Social Determinants of Health (SDOH) Interventions ?  ? ?Readmission Risk Interventions ?   ? View : No data to display.  ?  ?  ?  ? ? ?

## 2022-02-02 ENCOUNTER — Encounter: Payer: Self-pay | Admitting: Student in an Organized Health Care Education/Training Program

## 2022-02-02 DIAGNOSIS — R259 Unspecified abnormal involuntary movements: Secondary | ICD-10-CM | POA: Diagnosis not present

## 2022-02-02 DIAGNOSIS — F319 Bipolar disorder, unspecified: Secondary | ICD-10-CM | POA: Diagnosis not present

## 2022-02-02 DIAGNOSIS — R531 Weakness: Secondary | ICD-10-CM | POA: Diagnosis not present

## 2022-02-02 DIAGNOSIS — G20 Parkinson's disease: Secondary | ICD-10-CM | POA: Diagnosis not present

## 2022-02-02 DIAGNOSIS — I10 Essential (primary) hypertension: Secondary | ICD-10-CM | POA: Diagnosis not present

## 2022-02-02 DIAGNOSIS — R600 Localized edema: Secondary | ICD-10-CM | POA: Diagnosis not present

## 2022-02-02 DIAGNOSIS — J438 Other emphysema: Secondary | ICD-10-CM | POA: Diagnosis not present

## 2022-02-02 DIAGNOSIS — G6289 Other specified polyneuropathies: Secondary | ICD-10-CM

## 2022-02-02 DIAGNOSIS — G459 Transient cerebral ischemic attack, unspecified: Secondary | ICD-10-CM | POA: Diagnosis not present

## 2022-02-02 DIAGNOSIS — I639 Cerebral infarction, unspecified: Secondary | ICD-10-CM | POA: Diagnosis not present

## 2022-02-02 LAB — CBC
HCT: 40.7 % (ref 39.0–52.0)
Hemoglobin: 13.2 g/dL (ref 13.0–17.0)
MCH: 29.3 pg (ref 26.0–34.0)
MCHC: 32.4 g/dL (ref 30.0–36.0)
MCV: 90.4 fL (ref 80.0–100.0)
Platelets: 146 10*3/uL — ABNORMAL LOW (ref 150–400)
RBC: 4.5 MIL/uL (ref 4.22–5.81)
RDW: 13.8 % (ref 11.5–15.5)
WBC: 8.5 10*3/uL (ref 4.0–10.5)
nRBC: 0 % (ref 0.0–0.2)

## 2022-02-02 LAB — BASIC METABOLIC PANEL
Anion gap: 7 (ref 5–15)
BUN: 25 mg/dL — ABNORMAL HIGH (ref 8–23)
CO2: 35 mmol/L — ABNORMAL HIGH (ref 22–32)
Calcium: 8.7 mg/dL — ABNORMAL LOW (ref 8.9–10.3)
Chloride: 96 mmol/L — ABNORMAL LOW (ref 98–111)
Creatinine, Ser: 1.36 mg/dL — ABNORMAL HIGH (ref 0.61–1.24)
GFR, Estimated: 55 mL/min — ABNORMAL LOW (ref >60)
Glucose, Bld: 83 mg/dL (ref 70–99)
Potassium: 4.1 mmol/L (ref 3.5–5.1)
Sodium: 138 mmol/L (ref 135–145)

## 2022-02-02 MED ORDER — ATENOLOL 25 MG PO TABS
25.0000 mg | ORAL_TABLET | Freq: Every day | ORAL | Status: DC
  Administered 2022-02-02: 25 mg via ORAL
  Filled 2022-02-02 (×2): qty 1

## 2022-02-02 NOTE — TOC Progression Note (Addendum)
Transition of Care (TOC) - Progression Note  ? ? ?Patient Details  ?Name: Bradley Bowers ?MRN: [id number] ?Date of Birth: November 27, 1945 ? ?Transition of Care (TOC) CM/SW Contact  ?Frankee Gritz E Linell Shawn, LCSW ?Phone Number: ?02/02/2022, 10:22 AM ? ?Clinical Narrative:   Patient has insurance auth for [facility]. Notified MD and RN.  ?Per MD patient not medically ready today, awaiting consult. Plan for DC to [facility] Monday. CSW updated Tammy at [facility]. ? ? ? ?Expected Discharge Plan: [place] ?Barriers to Discharge: Barriers Resolved ? ?Expected Discharge Plan and Services ?Expected Discharge Plan: [place] ?  ?Discharge Planning Services: CM Consult ?  ?Living arrangements for the past 2 months: [place] ?Expected Discharge Date: 01/30/22               ?DME Arranged: N/A ?DME Agency: NA ?  ?  ?  ?HH Arranged: PT, OT ?[place] Agency: [facility] ?Date HH Agency Contacted: 01/30/22 ?Time [place]: 1506 ?Representative spoke with at [place]: Merleen Nicely ? ? ?Social Determinants of Health (SDOH) Interventions ?  ? ?Readmission Risk Interventions ?   ? View : No data to display.  ?  ?  ?  ? ? ?

## 2022-02-02 NOTE — Progress Notes (Signed)
?PROGRESS NOTE ? ?Bradley Bowers    DOB: 1946/09/27, 76 y.o.  ?[id number]:[id number]  ?  Code Status: DNR   ?DOA: 01/29/2022   LOS: 1  ? ?Brief hospital course  ?Bradley Bowers is a 76 y.o. male with a PMH significant for dyslipidemia, CAD, COPD, on home O2 at 2 L/min nightly, PAD and obstructive sleep apnea. ?They presented from home to the ED on 01/29/2022 with slurred speech and right sided extremity weakness. ?In the ED, they had CT of the head and neck with no acute intracranial process and no hemodynamically, significant stenosis in the neck and no intracranial large vessel occlusion or significant stenosis. ?Brain MRI without contrast revealed no evidence for acute intracranial process. ?Neurosurgery was consulted and recommended stroke work up. ?They were treated with 300 mg p.o. Plavix and will be continued on his baby aspirin.    ?Patient was admitted to medicine service for further workup and management of TIA as outlined in detail below. ? ?02/02/22 -stable, improved ? ?Assessment & Plan  ?Principal Problem: ?  TIA (transient ischemic attack) ?Active Problems: ?  Bipolar 1 disorder (Bradley Bowers) ?  Chronic obstructive pulmonary disease (COPD) (Bradley Bowers) ?  Essential hypertension ?  Peripheral neuropathy ?  CVA (cerebral vascular accident) [facility]) ? ?CVA-  no acute signs of infarction on brain imaging this admission. Some subacute changes and chronic atrophy. patient now stable and recommended to go to SNF at discharge. He and his wife describe about 6 months of dizziness and balance issues. He was seeing Dr. Manuella Ghazi outpatient who had initiated evaluation for Parkinson's disease which could also be contributing to his presenting symptoms.  ?- TOC consulted for SNF placement ?- ASA '81mg'$  + plavix x21 days and then plavix alone ?- continue statin '80mg'$  ?- PT/OT ?- re-consulted neurology for further evaluation, Dr. Quinn Axe agreed to see again. Appreciate recommendations ?- fall precautions ?  ?Bipolar 1 disorder (Bradley Bowers) ?- continue  Tegretol and Depakote ER as well as Zyprexa. ?  ?COPD- ([place]) on 2L [place] at baseline.  ?- continue albuterol while holding off his Advair discus. ?- continue Singulair. ?  ?Peripheral neuropathy ?-  continue Neurontin. ?  ?Essential hypertension ?- continue his antihypertensives  ?  ? ?Body mass index is 32.6 kg/m?. ? ?VTE ppx: lovenox ? ?Diet:  ?   ?Diet  ? Diet regular Room service appropriate? Yes; Fluid consistency: Thin  ? ?Subjective 02/02/22   ? ?Pt reports feeling well today. Has no complaints. He is grateful for reassessment by neurology. ? ?Objective  ? ?Vitals:  ? 02/01/22 1546 02/01/22 1939 02/02/22 0019 02/02/22 0432  ?BP: 119/71 132/70 (!) 114/55 (!) 114/54  ?Pulse: 73 73 64 (!) 53  ?Resp: '17 18 20 20  '$ ?Temp: 98 ?F (36.7 ?C) 98.7 ?F (37.1 ?C) 98.1 ?F (36.7 ?C) 97.8 ?F (36.6 ?C)  ?TempSrc: Oral Oral Oral Oral  ?SpO2: 95% 95% 98% 95%  ?Weight:      ?Height:      ? ? ?Intake/Output Summary (Last 24 hours) at 02/02/2022 0742 ?Last data filed at 02/01/2022 1940 ?Gross per 24 hour  ?Intake 840 ml  ?Output 625 ml  ?Net 215 ml  ? ? ?Filed Weights  ? 01/29/22 2006  ?Weight: 91.6 kg  ?  ? ?Physical Exam:  ?General: awake, alert, NAD ?HEENT: atraumatic, clear conjunctiva, anicteric sclera, MMM, hearing grossly normal ?Respiratory: normal respiratory effort. ?Cardiovascular: quick capillary refill, normal S1/S2, RRR, no JVD, murmurs ?Nervous: A&O x3. normal speech. Mild droop to left  mouth. Mild increased weakness of right leg.  ?Extremities: moves all equally, no edema, normal tone ?Skin: dry, intact, normal temperature, normal color. No rashes, lesions or ulcers on exposed skin ?Psychiatry:anxious mood ? ?Labs   ?I have personally reviewed the following labs and imaging studies ?CBC ?   ?Component Value Date/Time  ? WBC 8.5 02/02/2022 0537  ? RBC 4.50 02/02/2022 0537  ? HGB 13.2 02/02/2022 0537  ? HCT 40.7 02/02/2022 0537  ? PLT 146 (L) 02/02/2022 0537  ? MCV 90.4 02/02/2022 0537  ? MCH 29.3 02/02/2022 0537  ? MCHC  32.4 02/02/2022 0537  ? RDW 13.8 02/02/2022 0537  ? LYMPHSABS 2.0 01/29/2022 2102  ? MONOABS 0.7 01/29/2022 2102  ? EOSABS 0.4 01/29/2022 2102  ? BASOSABS 0.0 01/29/2022 2102  ? ? ?  Latest Ref Rng & Units 02/02/2022  ?  5:37 AM 01/29/2022  ?  9:02 PM 05/25/2021  ?  5:17 AM  ?BMP  ?Glucose 70 - 99 mg/dL 83   95   89    ?BUN 8 - 23 mg/dL 25   20   33    ?Creatinine 0.61 - 1.24 mg/dL 1.36   1.14   1.27    ?Sodium 135 - 145 mmol/L 138   139   140    ?Potassium 3.5 - 5.1 mmol/L 4.1   4.4   4.7    ?Chloride 98 - 111 mmol/L 96   102   96    ?CO2 22 - 32 mmol/L 35   30   34    ?Calcium 8.9 - 10.3 mg/dL 8.7   8.4   8.9    ? ? ?No results found. ? ?Disposition Plan & Communication  ?Patient status: Observation  ?Admitted From: Home ?Planned disposition location: SNF ?Anticipated discharge date: 5/1 pending repeat neurological evaluation and any further workup necessary ? ?Family Communication: wife ?  ?Author: ?Richarda Osmond, DO ?[facility] ?02/02/2022, 7:42 AM  ? ?Available by Epic secure chat 7AM-7PM. ?If 7PM-7AM, please contact night-coverage.  ?[facility] contact information found on [email or web address]. ? ?

## 2022-02-03 DIAGNOSIS — R531 Weakness: Secondary | ICD-10-CM | POA: Diagnosis not present

## 2022-02-03 DIAGNOSIS — I10 Essential (primary) hypertension: Secondary | ICD-10-CM | POA: Diagnosis not present

## 2022-02-03 DIAGNOSIS — R259 Unspecified abnormal involuntary movements: Secondary | ICD-10-CM | POA: Diagnosis not present

## 2022-02-03 DIAGNOSIS — G6289 Other specified polyneuropathies: Secondary | ICD-10-CM | POA: Diagnosis not present

## 2022-02-03 DIAGNOSIS — J438 Other emphysema: Secondary | ICD-10-CM | POA: Diagnosis not present

## 2022-02-03 DIAGNOSIS — I639 Cerebral infarction, unspecified: Secondary | ICD-10-CM | POA: Diagnosis not present

## 2022-02-03 DIAGNOSIS — F319 Bipolar disorder, unspecified: Secondary | ICD-10-CM | POA: Diagnosis not present

## 2022-02-03 DIAGNOSIS — R600 Localized edema: Secondary | ICD-10-CM | POA: Diagnosis not present

## 2022-02-03 MED ORDER — ATENOLOL 25 MG PO TABS
12.5000 mg | ORAL_TABLET | Freq: Every day | ORAL | Status: DC
  Administered 2022-02-03 – 2022-02-04 (×2): 12.5 mg via ORAL
  Filled 2022-02-03 (×2): qty 0.5

## 2022-02-03 NOTE — Progress Notes (Signed)
?PROGRESS NOTE ? ?Bradley Bowers    DOB: [birth year], 76 y.o.  ?[id number]  ?  Code Status: DNR   ?DOA: 01/29/2022   LOS: 1  ? ?Brief hospital course  ?Bradley Bowers is a 76 y.o. male with a PMH significant for dyslipidemia, CAD, COPD, on home O2 at 2 L/min nightly, PAD and obstructive sleep apnea. ?They presented from home to the ED on 01/29/2022 with slurred speech and right sided extremity weakness. ?In the ED, they had CT of the head and neck with no acute intracranial process and no hemodynamically, significant stenosis in the neck and no intracranial large vessel occlusion or significant stenosis. ?Brain MRI without contrast revealed no evidence for acute intracranial process. ?Neurosurgery was consulted and recommended stroke work up. ?They were treated with 300 mg p.o. Plavix and will be continued on his baby aspirin.    ?Patient was admitted to medicine service for further workup and management of TIA as outlined in detail below. ? ?02/03/22 -stable, improved ? ?Assessment & Plan  ?Principal Problem: ?  TIA (transient ischemic attack) ?Active Problems: ?  Bipolar 1 disorder ([place]) ?  Chronic obstructive pulmonary disease (COPD) ([place]) ?  Essential hypertension ?  Peripheral neuropathy ?  CVA (cerebral vascular accident) [facility]) ? ?CVA  Parkinsonism-  no acute signs of infarction on brain imaging this admission. Some subacute changes and chronic atrophy. patient now stable and recommended to go to SNF at discharge. He and his wife describe about 6 months of dizziness and balance issues. He was seeing Dr. Manuella Ghazi outpatient who had initiated evaluation for Parkinson's disease which could also be contributing to his presenting symptoms. Dr. Quinn Axe, neurology, reevaluated yesterday and agrees with parkinson's diagnosis. Potentially as side effect from olanzapine which wife thinks he's been taking 3-4 years.  ?- TOC consulted for SNF placement ?- ASA '81mg'$  + plavix x21 days and then plavix alone ?- continue  statin '80mg'$  ?- PT/OT ?- fall precautions ?- f/u outpatient with Dr. Manuella Ghazi ?- f/u outpatient with psychiatry to consider olanzapine weaning ?  ?Bipolar 1 disorder ([place]) ?- continue Tegretol and Depakote ER as well as Zyprexa. ?  ?COPD- ([place]) on 2L [place] at baseline.  ?- continue albuterol while holding off his Advair discus. ?- continue Singulair. ?  ?Peripheral neuropathy ?-  continue Neurontin. ?  ?Essential hypertension ?- continue his antihypertensives  ?  ? ?Body mass index is 32.6 kg/m?. ? ?VTE ppx: lovenox ? ?Diet:  ?   ?Diet  ? Diet regular Room service appropriate? Yes; Fluid consistency: Thin  ? ?Subjective 02/03/22   ? ?Pt reports feeling better today. Thinks he has better movement in his right leg. He and his wife are happy to have more information about his diagnosis. ? ?Objective  ? ?Vitals:  ? 02/02/22 2004 02/03/22 5885 02/03/22 0728 02/03/22 0740  ?BP: 100/60 (!) 102/56 (!) 94/48 122/73  ?Pulse: 67 (!) 56 (!) 102   ?Resp: '19 16 18   '$ ?Temp: 97.6 ?F (36.4 ?C) (!) 97.4 ?F (36.3 ?C) 98 ?F (36.7 ?C)   ?TempSrc:      ?SpO2: 95% 97% 96%   ?Weight:      ?Height:      ? ? ?Intake/Output Summary (Last 24 hours) at 02/03/2022 0747 ?Last data filed at 02/03/2022 0730 ?Gross per 24 hour  ?Intake 240 ml  ?Output 900 ml  ?Net -660 ml  ? ? ?Filed Weights  ? 01/29/22 2006  ?Weight: 91.6 kg  ?  ? ?Physical Exam:  ?  General: awake, alert, NAD ?HEENT: atraumatic, clear conjunctiva, anicteric sclera, MMM, hearing grossly normal ?Respiratory: normal respiratory effort. ?Cardiovascular: quick capillary refill, normal S1/S2, RRR, no JVD, murmurs ?Nervous: A&O x3. normal speech. Mild droop to left mouth. Mild increased weakness of right leg.  ?Extremities: moves all equally, no edema, normal tone ?Skin: dry, intact, normal temperature, normal color. No rashes, lesions or ulcers on exposed skin ?Psychiatry: normal mood ? ?Labs   ?I have personally reviewed the following labs and imaging studies ?CBC ?   ?Component Value Date/Time  ?  WBC 8.5 02/02/2022 0537  ? RBC 4.50 02/02/2022 0537  ? HGB 13.2 02/02/2022 0537  ? HCT 40.7 02/02/2022 0537  ? PLT 146 (L) 02/02/2022 0537  ? MCV 90.4 02/02/2022 0537  ? MCH 29.3 02/02/2022 0537  ? MCHC 32.4 02/02/2022 0537  ? RDW 13.8 02/02/2022 0537  ? LYMPHSABS 2.0 01/29/2022 2102  ? MONOABS 0.7 01/29/2022 2102  ? EOSABS 0.4 01/29/2022 2102  ? BASOSABS 0.0 01/29/2022 2102  ? ? ?  Latest Ref Rng & Units 02/02/2022  ?  5:37 AM 01/29/2022  ?  9:02 PM 05/25/2021  ?  5:17 AM  ?BMP  ?Glucose 70 - 99 mg/dL 83   95   89    ?BUN 8 - 23 mg/dL 25   20   33    ?Creatinine 0.61 - 1.24 mg/dL 1.36   1.14   1.27    ?Sodium 135 - 145 mmol/L 138   139   140    ?Potassium 3.5 - 5.1 mmol/L 4.1   4.4   4.7    ?Chloride 98 - 111 mmol/L 96   102   96    ?CO2 22 - 32 mmol/L 35   30   34    ?Calcium 8.9 - 10.3 mg/dL 8.7   8.4   8.9    ? ? ?No results found. ? ?Disposition Plan & Communication  ?Patient status: Observation  ?Admitted From: Home ?Planned disposition location: SNF ?Anticipated discharge date: 5/1 ? ?Family Communication: wife at  bedside ?  ?Author: ?Richarda Osmond, DO ?[facility] ?02/03/2022, 7:47 AM  ? ?Available by Epic secure chat 7AM-7PM. ?If 7PM-7AM, please contact night-coverage.  ?[facility] contact information found on [email or web address]. ? ?

## 2022-02-03 NOTE — Consult Note (Signed)
NEUROLOGY CONSULTATION NOTE  ? ?Date of service: February 03, 2022 ?Patient Name: Bradley Bowers ?MRN:  [id number] ?DOB:  Oct 28, 1945 ?Reason for consult: progressive decline ?Requesting physician: Dr. Doristine Mango ?_ _ _   _ __   _ __ _ _  __ __   _ __   __ _ ? ?History of Present Illness  ? ?76 yo man with hx HTN, HL, CAD, COPD, recent dx Parkinson's disease admitted on January 29, 2022 with slurred speech and right sided weakness.  MRI of the brain showed no evidence of acute stroke.  CTA head and neck showed no hemodynamically significant stenosis.  TTE showed no intracardiac clot or alternative etiology for event and was negative for PFO.  Neurology is consulted due to patient and wife's concern for progressive decline over the last 6 months particularly with respect to his balance and gait.  Patient was seen by Dr. Manuella Ghazi as an outpatient on November 21, 2021 and was found to have unilateral resting tremor, decreased volume of speech, imbalance, shuffling gait, short steps, decreased arm swing, hunched forward posture, and mild cogwheeling of the left hand.  As such Dr. Brigitte Pulse told him that he was concern for Parkinson's disease.  His exam today is consistent with same.  Patient is currently on olanzapine but cannot tell me when he started this. ?  ?ROS  ? ?Per HPI: all other systems reviewed and are negative ? ?Past History  ? ?I have reviewed the following: ? ?Past Medical History:  ?Diagnosis Date  ? Arterial vascular disease 10/24/2014  ? Overview:  MI 1994, small-vessel disease requiring coumadin   ? Asthma   ? Centrilobular emphysema ([place]) 10/24/2014  ? Overview:  2L O2 prn   ? Centrilobular emphysema ([place])   ? Chronic obstructive pulmonary disease ([place]) 11/06/2015  ? Coronary artery disease   ? GERD (gastroesophageal reflux disease)   ? Glaucoma   ? Heart attack ([place]) 2/17, 1994  ? Heart disease   ? Hyperlipemia   ? Hypertension   ? Restless leg 10/24/2014  ? Sleep apnea   ? ?Past Surgical History:  ?Procedure  Laterality Date  ? CATARACT EXTRACTION W/ INTRAOCULAR LENS  IMPLANT, BILATERAL    ? COLONOSCOPY WITH PROPOFOL N/A 02/19/2018  ? Procedure: COLONOSCOPY WITH PROPOFOL;  Surgeon: Lollie Sails, MD;  Location[facility] ENDOSCOPY;  Service: Endoscopy;  Laterality: N/A;  ? ESOPHAGOGASTRODUODENOSCOPY (EGD) WITH PROPOFOL N/A 02/19/2018  ? Procedure: ESOPHAGOGASTRODUODENOSCOPY (EGD) WITH PROPOFOL;  Surgeon: Lollie Sails, MD;  Location[facility] ENDOSCOPY;  Service: Endoscopy;  Laterality: N/A;  ? EYE SURGERY    ? ?Family History  ?Problem Relation Age of Onset  ? Nephrolithiasis Father   ? Prostate cancer Neg Hx   ? Bladder Cancer Neg Hx   ? Kidney cancer Neg Hx   ? ?Social History  ? ?Socioeconomic History  ? Marital status: Married  ?  Spouse name: Not on file  ? Number of children: Not on file  ? Years of education: Not on file  ? Highest education level: Not on file  ?Occupational History  ? Not on file  ?Tobacco Use  ? Smoking status: Former  ?  Packs/day: 1.00  ?  Years: 30.00  ?  Pack years: 30.00  ?  Types: Cigarettes  ?  Quit date: 03/07/1994  ?  Years since quitting: 27.9  ? Smokeless tobacco: Never  ?Vaping Use  ? Vaping Use: Never used  ?Substance and Sexual Activity  ? Alcohol use:  No  ?  Alcohol/week: 0.0 standard drinks  ? Drug use: No  ? Sexual activity: Not on file  ?Other Topics Concern  ? Not on file  ?Social History Narrative  ? Not on file  ? ?Social Determinants of Health  ? ?Financial Resource Strain: Not on file  ?Food Insecurity: Not on file  ?Transportation Needs: Not on file  ?Physical Activity: Not on file  ?Stress: Not on file  ?Social Connections: Not on file  ? ?No Known Allergies ? ?Medications  ? ?Medications Prior to Admission  ?Medication Sig Dispense Refill Last Dose  ? ADVAIR DISKUS 100-50 MCG/ACT AEPB Inhale 1 puff into the lungs 2 (two) times daily.   01/29/2022 at 0800  ? albuterol (VENTOLIN HFA) 108 (90 Base) MCG/ACT inhaler Inhale 1-2 puffs into the lungs every 4 (four) hours as  needed.   01/29/2022 at 0800  ? aspirin 81 MG EC tablet Take 81 mg by mouth daily.  11 01/29/2022 at 0800  ? atenolol (TENORMIN) 50 MG tablet Take 50 mg by mouth daily.   01/29/2022 at 0800  ? carbamazepine (TEGRETOL) 200 MG tablet Take 1 tablet by mouth 2 (two) times daily.   01/29/2022 at 0800  ? divalproex (DEPAKOTE ER) 500 MG 24 hr tablet Take 3 tablets (1,500 mg total) by mouth at bedtime. 90 tablet 1 01/28/2022 at 2100  ? donepezil (ARICEPT) 5 MG tablet Take 10 mg by mouth at bedtime.   01/28/2022 at 2100  ? fluticasone furoate-vilanterol (BREO ELLIPTA) 100-25 MCG/INH AEPB Inhale 1 puff into the lungs daily.   01/29/2022 at 0800  ? hydrochlorothiazide (HYDRODIURIL) 25 MG tablet Take 1 tablet by mouth.   01/29/2022  ? lisinopril (ZESTRIL) 5 MG tablet Take 1 tablet by mouth daily.   01/29/2022 at 0800  ? montelukast (SINGULAIR) 10 MG tablet Take 10 mg by mouth at bedtime.   01/28/2022 at 2100  ? OLANZapine (ZYPREXA) 5 MG tablet Take 5 mg by mouth at bedtime.   01/28/2022 at 2100  ? timolol (TIMOPTIC) 0.5 % ophthalmic solution 1 drop 2 (two) times daily.   01/29/2022 at 0800  ? tiotropium (SPIRIVA) 18 MCG inhalation capsule Place 18 mcg into inhaler and inhale daily.   01/29/2022 at 0800  ? vitamin B-12 1000 MCG tablet Take 1 tablet (1,000 mcg total) by mouth daily. 30 tablet 1 01/29/2022 at 0800  ? fluticasone (FLONASE) 50 MCG/ACT nasal spray Place 1 spray into both nostrils daily.   prn at prn  ?  ? ? ?Current Facility-Administered Medications:  ?   stroke: early stages of recovery book, , Does not apply, Once, Mansy, Jan A, MD ?  acetaminophen (TYLENOL) tablet 650 mg, 650 mg, Oral, Q4H PRN **OR** acetaminophen (TYLENOL) 160 MG/5ML solution 650 mg, 650 mg, Per Tube, Q4H PRN **OR** acetaminophen (TYLENOL) suppository 650 mg, 650 mg, Rectal, Q4H PRN, Mansy, Jan A, MD ?  albuterol (PROVENTIL) (2.5 MG/3ML) 0.083% nebulizer solution 2.5 mg, 2.5 mg, Inhalation, Q4H PRN, Mansy, Jan A, MD ?  aspirin EC tablet 81 mg, 81 mg, Oral,  Daily, Mansy, Jan A, MD, 81 mg at 02/02/22 7353 ?  atenolol (TENORMIN) tablet 12.5 mg, 12.5 mg, Oral, Daily, Richarda Osmond, MD ?  atorvastatin (LIPITOR) tablet 80 mg, 80 mg, Oral, Daily, Richarda Osmond, MD, 80 mg at 02/02/22 0901 ?  carbamazepine (TEGRETOL) tablet 200 mg, 200 mg, Oral, BID, Mansy, Jan A, MD, 200 mg at 02/02/22 2105 ?  clopidogrel (PLAVIX) tablet 75 mg, 75 mg,  Oral, Daily, Mansy, Jan A, MD, 75 mg at 02/02/22 0973 ?  divalproex (DEPAKOTE ER) 24 hr tablet 1,500 mg, 1,500 mg, Oral, QHS, Mansy, Jan A, MD, 1,500 mg at 02/02/22 2105 ?  donepezil (ARICEPT) tablet 5 mg, 5 mg, Oral, QHS, Mansy, Jan A, MD, 5 mg at 02/02/22 2105 ?  enoxaparin (LOVENOX) injection 45 mg, 0.5 mg/kg, Subcutaneous, Q24H, Mansy, Jan A, MD, 45 mg at 02/02/22 2105 ?  fluticasone (FLONASE) 50 MCG/ACT nasal spray 1 spray, 1 spray, Each Nare, Daily PRN, Mansy, Jan A, MD ?  lisinopril (ZESTRIL) tablet 5 mg, 5 mg, Oral, Daily, Mansy, Jan A, MD, 5 mg at 02/02/22 5329 ?  OLANZapine (ZYPREXA) tablet 5 mg, 5 mg, Oral, QHS, Mansy, Jan A, MD, 5 mg at 02/02/22 2105 ?  vitamin B-12 (CYANOCOBALAMIN) tablet 1,000 mcg, 1,000 mcg, Oral, Daily, Mansy, Jan A, MD, 1,000 mcg at 02/02/22 0902 ? ?Vitals  ? ?Vitals:  ? 02/02/22 2004 02/03/22 9242 02/03/22 0728 02/03/22 0740  ?BP: 100/60 (!) 102/56 (!) 94/48 122/73  ?Pulse: 67 (!) 56 (!) 102 (!) 109  ?Resp: '19 16 18   '$ ?Temp: 97.6 ?F (36.4 ?C) (!) 97.4 ?F (36.3 ?C) 98 ?F (36.7 ?C)   ?TempSrc:      ?SpO2: 95% 97% 96%   ?Weight:      ?Height:      ?  ? ?Body mass index is 32.6 kg/m?. ? ?Physical Exam  ? ?Physical Exam ?Gen: A&O x4, NAD ?HEENT: Atraumatic, normocephalic;mucous membranes moist; oropharynx clear, tongue without atrophy or fasciculations. ?Neck: Supple, trachea midline. ?Resp: CTAB, no w/r/r ?CV: RRR, no m/g/r; nml S1 and S2. 2+ symmetric peripheral pulses. ?Abd: soft/NT/ND; nabs x 4 quad ?Extrem: Nml bulk; no cyanosis, clubbing, or edema. ? ?Neuro: ?*MS: A&O x4. Follows multi-step  commands.  ?*Speech: quiet but fluid, no dysarthria or aphasia ?*CN:  ?  I: Deferred ?  II,III: PERRLA, VFF by confrontation, optic discs unable to be visualized 2/2 pupillary constriction ?  III,IV,VI: EOMI w/o nysta

## 2022-02-04 DIAGNOSIS — R600 Localized edema: Secondary | ICD-10-CM | POA: Diagnosis not present

## 2022-02-04 DIAGNOSIS — G6289 Other specified polyneuropathies: Secondary | ICD-10-CM | POA: Diagnosis not present

## 2022-02-04 DIAGNOSIS — Z20822 Contact with and (suspected) exposure to covid-19: Secondary | ICD-10-CM | POA: Diagnosis not present

## 2022-02-04 DIAGNOSIS — I2510 Atherosclerotic heart disease of native coronary artery without angina pectoris: Secondary | ICD-10-CM | POA: Diagnosis not present

## 2022-02-04 DIAGNOSIS — R259 Unspecified abnormal involuntary movements: Secondary | ICD-10-CM | POA: Diagnosis not present

## 2022-02-04 DIAGNOSIS — G3184 Mild cognitive impairment, so stated: Secondary | ICD-10-CM | POA: Diagnosis not present

## 2022-02-04 DIAGNOSIS — G459 Transient cerebral ischemic attack, unspecified: Secondary | ICD-10-CM | POA: Diagnosis not present

## 2022-02-04 DIAGNOSIS — R531 Weakness: Secondary | ICD-10-CM | POA: Diagnosis not present

## 2022-02-04 DIAGNOSIS — Z79899 Other long term (current) drug therapy: Secondary | ICD-10-CM | POA: Diagnosis not present

## 2022-02-04 DIAGNOSIS — R569 Unspecified convulsions: Secondary | ICD-10-CM | POA: Diagnosis not present

## 2022-02-04 DIAGNOSIS — Z7982 Long term (current) use of aspirin: Secondary | ICD-10-CM | POA: Diagnosis not present

## 2022-02-04 DIAGNOSIS — E038 Other specified hypothyroidism: Secondary | ICD-10-CM | POA: Diagnosis not present

## 2022-02-04 DIAGNOSIS — I10 Essential (primary) hypertension: Secondary | ICD-10-CM | POA: Diagnosis not present

## 2022-02-04 DIAGNOSIS — J45909 Unspecified asthma, uncomplicated: Secondary | ICD-10-CM | POA: Diagnosis not present

## 2022-02-04 DIAGNOSIS — I639 Cerebral infarction, unspecified: Secondary | ICD-10-CM | POA: Diagnosis not present

## 2022-02-04 DIAGNOSIS — J449 Chronic obstructive pulmonary disease, unspecified: Secondary | ICD-10-CM | POA: Diagnosis not present

## 2022-02-04 DIAGNOSIS — G4733 Obstructive sleep apnea (adult) (pediatric): Secondary | ICD-10-CM | POA: Diagnosis not present

## 2022-02-04 DIAGNOSIS — J438 Other emphysema: Secondary | ICD-10-CM | POA: Diagnosis not present

## 2022-02-04 DIAGNOSIS — E559 Vitamin D deficiency, unspecified: Secondary | ICD-10-CM | POA: Diagnosis not present

## 2022-02-04 DIAGNOSIS — F319 Bipolar disorder, unspecified: Secondary | ICD-10-CM | POA: Diagnosis not present

## 2022-02-04 DIAGNOSIS — Z87891 Personal history of nicotine dependence: Secondary | ICD-10-CM | POA: Diagnosis not present

## 2022-02-04 DIAGNOSIS — I679 Cerebrovascular disease, unspecified: Secondary | ICD-10-CM | POA: Diagnosis not present

## 2022-02-04 DIAGNOSIS — M6281 Muscle weakness (generalized): Secondary | ICD-10-CM | POA: Diagnosis not present

## 2022-02-04 DIAGNOSIS — Z743 Need for continuous supervision: Secondary | ICD-10-CM | POA: Diagnosis not present

## 2022-02-04 LAB — BASIC METABOLIC PANEL
Anion gap: 7 (ref 5–15)
BUN: 38 mg/dL — ABNORMAL HIGH (ref 8–23)
CO2: 34 mmol/L — ABNORMAL HIGH (ref 22–32)
Calcium: 8.4 mg/dL — ABNORMAL LOW (ref 8.9–10.3)
Chloride: 96 mmol/L — ABNORMAL LOW (ref 98–111)
Creatinine, Ser: 1.41 mg/dL — ABNORMAL HIGH (ref 0.61–1.24)
GFR, Estimated: 53 mL/min — ABNORMAL LOW (ref >60)
Glucose, Bld: 84 mg/dL (ref 70–99)
Potassium: 4 mmol/L (ref 3.5–5.1)
Sodium: 137 mmol/L (ref 135–145)

## 2022-02-04 LAB — CBC
HCT: 41.7 % (ref 39.0–52.0)
Hemoglobin: 13.4 g/dL (ref 13.0–17.0)
MCH: 29.1 pg (ref 26.0–34.0)
MCHC: 32.1 g/dL (ref 30.0–36.0)
MCV: 90.7 fL (ref 80.0–100.0)
Platelets: 144 10*3/uL — ABNORMAL LOW (ref 150–400)
RBC: 4.6 MIL/uL (ref 4.22–5.81)
RDW: 13.6 % (ref 11.5–15.5)
WBC: 8.8 10*3/uL (ref 4.0–10.5)
nRBC: 0 % (ref 0.0–0.2)

## 2022-02-04 MED ORDER — LISINOPRIL 2.5 MG PO TABS: 2.5000 mg | ORAL_TABLET | Freq: Every day | ORAL | Status: DC

## 2022-02-04 MED ORDER — TIMOLOL MALEATE 0.5 % OP SOLN
1.0000 [drp] | Freq: Two times a day (BID) | OPHTHALMIC | Status: DC
  Administered 2022-02-04: 1 [drp] via OPHTHALMIC
  Filled 2022-02-04: qty 5

## 2022-02-04 MED ORDER — ATENOLOL 25 MG PO TABS: 12.5000 mg | ORAL_TABLET | Freq: Every day | ORAL | Status: DC

## 2022-02-04 MED ORDER — ATORVASTATIN CALCIUM 80 MG PO TABS: 80.0000 mg | ORAL_TABLET | Freq: Every day | ORAL | 0 refills | Status: DC

## 2022-02-04 MED ORDER — CLOPIDOGREL BISULFATE 75 MG PO TABS: 75.0000 mg | ORAL_TABLET | Freq: Every day | ORAL | 0 refills | Status: AC

## 2022-02-04 NOTE — TOC Transition Note (Signed)
Transition of Care (TOC) - CM/SW Discharge Note ? ? ?Patient Details  ?Name: Bradley Bowers ?MRN: [id number] ?Date of Birth: [birth year] ? ?Transition of Care (TOC) CM/SW Contact:  ?Jakell Trusty A Aliayah Tyer, LCSW ?Phone Number: ?02/04/2022, 10:37 AM ? ? ?Clinical Narrative:   Clinical Social Worker facilitated patient discharge including contacting patient family and facility to confirm patient discharge plans.  Clinical information faxed to facility and family agreeable with plan.  CSW arranged ambulance transport via [facility] to [facility] room 704.  RN to call [phone] for report prior to discharge. ? ? ? ?Final next level of care: [place] ?Barriers to Discharge: No Barriers Identified ? ? ?Patient Goals and CMS Choice ?Patient states their goals for this hospitalization and ongoing recovery are:: patient agrees to home health ?CMS Medicare.gov Compare Post Acute Care list provided to:: Patient ?Choice offered to / list presented to : Patient ? ?Discharge Placement ?  ?           ?Patient chooses bed at:  ([facility]) ?Patient to be transferred to facility by: [facility] ?  ?Patient and family notified of of transfer: 02/04/22 ? ?Discharge Plan and Services ?  ?Discharge Planning Services: CM Consult ?           ?DME Arranged: N/A ?DME Agency: NA ?  ?  ?  ?HH Arranged: PT, OT ?[place] Agency: [facility] ?Date HH Agency Contacted: 01/30/22 ?Time [place]: 1506 ?Representative spoke with at [place]: Merleen Nicely ? ?Social Determinants of Health (SDOH) Interventions ?  ? ? ?Readmission Risk Interventions ?   ? View : No data to display.  ?  ?  ?  ? ? ? ? ? ?

## 2022-02-04 NOTE — TOC Progression Note (Signed)
Transition of Care (TOC) - Progression Note  ? ? ?Patient Details  ?Name: Bradley Bowers ?MRN: [id number] ?Date of Birth: [birth year] ? ?Transition of Care (TOC) CM/SW Contact  ?Kadrian Partch A Zylon Creamer, LCSW ?Phone Number: ?02/04/2022, 9:07 AM ? ?Clinical Narrative:   CSW spoke with Tammy at [facility] and pt can admit today if medically stable. ? ? ? ?Expected Discharge Plan: [place] ?Barriers to Discharge: Barriers Resolved ? ?Expected Discharge Plan and Services ?Expected Discharge Plan: [place] ?  ?Discharge Planning Services: CM Consult ?  ?Living arrangements for the past 2 months: [place] ?Expected Discharge Date: 01/30/22               ?DME Arranged: N/A ?DME Agency: NA ?  ?  ?  ?HH Arranged: PT, OT ?[place] Agency: [facility] ?Date HH Agency Contacted: 01/30/22 ?Time [place]: 1506 ?Representative spoke with at [place]: Merleen Nicely ? ? ?Social Determinants of Health (SDOH) Interventions ?  ? ?Readmission Risk Interventions ?   ? View : No data to display.  ?  ?  ?  ? ? ?

## 2022-02-05 DIAGNOSIS — I679 Cerebrovascular disease, unspecified: Secondary | ICD-10-CM | POA: Diagnosis not present

## 2022-02-05 DIAGNOSIS — G3184 Mild cognitive impairment, so stated: Secondary | ICD-10-CM | POA: Diagnosis not present

## 2022-02-05 DIAGNOSIS — J449 Chronic obstructive pulmonary disease, unspecified: Secondary | ICD-10-CM | POA: Diagnosis not present

## 2022-02-05 DIAGNOSIS — I10 Essential (primary) hypertension: Secondary | ICD-10-CM | POA: Diagnosis not present

## 2022-02-08 DIAGNOSIS — J449 Chronic obstructive pulmonary disease, unspecified: Secondary | ICD-10-CM | POA: Diagnosis not present

## 2022-02-08 DIAGNOSIS — Z79899 Other long term (current) drug therapy: Secondary | ICD-10-CM | POA: Diagnosis not present

## 2022-02-08 DIAGNOSIS — I10 Essential (primary) hypertension: Secondary | ICD-10-CM | POA: Diagnosis not present

## 2022-02-08 DIAGNOSIS — I679 Cerebrovascular disease, unspecified: Secondary | ICD-10-CM | POA: Diagnosis not present

## 2022-02-12 DIAGNOSIS — J449 Chronic obstructive pulmonary disease, unspecified: Secondary | ICD-10-CM | POA: Diagnosis not present

## 2022-02-12 DIAGNOSIS — I10 Essential (primary) hypertension: Secondary | ICD-10-CM | POA: Diagnosis not present

## 2022-02-12 DIAGNOSIS — E038 Other specified hypothyroidism: Secondary | ICD-10-CM | POA: Diagnosis not present

## 2022-02-12 DIAGNOSIS — E559 Vitamin D deficiency, unspecified: Secondary | ICD-10-CM | POA: Diagnosis not present

## 2022-02-12 DIAGNOSIS — I679 Cerebrovascular disease, unspecified: Secondary | ICD-10-CM | POA: Diagnosis not present

## 2022-02-12 DIAGNOSIS — R569 Unspecified convulsions: Secondary | ICD-10-CM | POA: Diagnosis not present

## 2022-02-15 DIAGNOSIS — I10 Essential (primary) hypertension: Secondary | ICD-10-CM | POA: Diagnosis not present

## 2022-02-15 DIAGNOSIS — J449 Chronic obstructive pulmonary disease, unspecified: Secondary | ICD-10-CM | POA: Diagnosis not present

## 2022-02-19 DIAGNOSIS — I679 Cerebrovascular disease, unspecified: Secondary | ICD-10-CM | POA: Diagnosis not present

## 2022-02-19 DIAGNOSIS — I10 Essential (primary) hypertension: Secondary | ICD-10-CM | POA: Diagnosis not present

## 2022-02-19 DIAGNOSIS — J449 Chronic obstructive pulmonary disease, unspecified: Secondary | ICD-10-CM | POA: Diagnosis not present

## 2022-02-19 DIAGNOSIS — M6281 Muscle weakness (generalized): Secondary | ICD-10-CM | POA: Diagnosis not present

## 2022-02-22 ENCOUNTER — Other Ambulatory Visit: Payer: Self-pay | Admitting: Student in an Organized Health Care Education/Training Program

## 2022-02-25 DIAGNOSIS — R531 Weakness: Secondary | ICD-10-CM | POA: Diagnosis not present

## 2022-02-25 DIAGNOSIS — Z8673 Personal history of transient ischemic attack (TIA), and cerebral infarction without residual deficits: Secondary | ICD-10-CM | POA: Diagnosis not present

## 2022-02-25 DIAGNOSIS — E559 Vitamin D deficiency, unspecified: Secondary | ICD-10-CM | POA: Diagnosis not present

## 2022-02-25 DIAGNOSIS — J449 Chronic obstructive pulmonary disease, unspecified: Secondary | ICD-10-CM | POA: Diagnosis not present

## 2022-02-25 DIAGNOSIS — N183 Chronic kidney disease, stage 3 unspecified: Secondary | ICD-10-CM | POA: Diagnosis not present

## 2022-02-28 DIAGNOSIS — G20 Parkinson's disease: Secondary | ICD-10-CM | POA: Diagnosis not present

## 2022-02-28 DIAGNOSIS — F319 Bipolar disorder, unspecified: Secondary | ICD-10-CM | POA: Diagnosis not present

## 2022-02-28 DIAGNOSIS — R55 Syncope and collapse: Secondary | ICD-10-CM | POA: Diagnosis not present

## 2022-02-28 DIAGNOSIS — I639 Cerebral infarction, unspecified: Secondary | ICD-10-CM | POA: Diagnosis not present

## 2022-03-01 ENCOUNTER — Other Ambulatory Visit: Payer: Self-pay | Admitting: Student in an Organized Health Care Education/Training Program

## 2022-03-07 DIAGNOSIS — J449 Chronic obstructive pulmonary disease, unspecified: Secondary | ICD-10-CM | POA: Diagnosis not present

## 2022-03-07 DIAGNOSIS — G4733 Obstructive sleep apnea (adult) (pediatric): Secondary | ICD-10-CM | POA: Diagnosis not present

## 2022-03-25 DIAGNOSIS — J449 Chronic obstructive pulmonary disease, unspecified: Secondary | ICD-10-CM | POA: Diagnosis not present

## 2022-03-26 DIAGNOSIS — I129 Hypertensive chronic kidney disease with stage 1 through stage 4 chronic kidney disease, or unspecified chronic kidney disease: Secondary | ICD-10-CM | POA: Diagnosis not present

## 2022-03-26 DIAGNOSIS — J432 Centrilobular emphysema: Secondary | ICD-10-CM | POA: Diagnosis not present

## 2022-03-26 DIAGNOSIS — N182 Chronic kidney disease, stage 2 (mild): Secondary | ICD-10-CM | POA: Diagnosis not present

## 2022-03-26 DIAGNOSIS — G629 Polyneuropathy, unspecified: Secondary | ICD-10-CM | POA: Diagnosis not present

## 2022-03-26 DIAGNOSIS — I2510 Atherosclerotic heart disease of native coronary artery without angina pectoris: Secondary | ICD-10-CM | POA: Diagnosis not present

## 2022-03-26 DIAGNOSIS — I69351 Hemiplegia and hemiparesis following cerebral infarction affecting right dominant side: Secondary | ICD-10-CM | POA: Diagnosis not present

## 2022-03-26 DIAGNOSIS — I739 Peripheral vascular disease, unspecified: Secondary | ICD-10-CM | POA: Diagnosis not present

## 2022-04-05 ENCOUNTER — Other Ambulatory Visit: Payer: Self-pay | Admitting: Student in an Organized Health Care Education/Training Program

## 2022-04-06 DIAGNOSIS — G4733 Obstructive sleep apnea (adult) (pediatric): Secondary | ICD-10-CM | POA: Diagnosis not present

## 2022-04-06 DIAGNOSIS — J449 Chronic obstructive pulmonary disease, unspecified: Secondary | ICD-10-CM | POA: Diagnosis not present

## 2022-05-25 DIAGNOSIS — J449 Chronic obstructive pulmonary disease, unspecified: Secondary | ICD-10-CM | POA: Diagnosis not present

## 2022-05-25 DIAGNOSIS — G4733 Obstructive sleep apnea (adult) (pediatric): Secondary | ICD-10-CM | POA: Diagnosis not present

## 2022-06-07 DIAGNOSIS — G4733 Obstructive sleep apnea (adult) (pediatric): Secondary | ICD-10-CM | POA: Diagnosis not present

## 2022-06-07 DIAGNOSIS — J449 Chronic obstructive pulmonary disease, unspecified: Secondary | ICD-10-CM | POA: Diagnosis not present

## 2022-07-02 DIAGNOSIS — I252 Old myocardial infarction: Secondary | ICD-10-CM | POA: Diagnosis not present

## 2022-07-02 DIAGNOSIS — R55 Syncope and collapse: Secondary | ICD-10-CM | POA: Diagnosis not present

## 2022-07-02 DIAGNOSIS — G20 Parkinson's disease: Secondary | ICD-10-CM | POA: Diagnosis not present

## 2022-07-02 DIAGNOSIS — J449 Chronic obstructive pulmonary disease, unspecified: Secondary | ICD-10-CM | POA: Diagnosis not present

## 2022-07-02 DIAGNOSIS — Z8673 Personal history of transient ischemic attack (TIA), and cerebral infarction without residual deficits: Secondary | ICD-10-CM | POA: Diagnosis not present

## 2022-07-02 DIAGNOSIS — E782 Mixed hyperlipidemia: Secondary | ICD-10-CM | POA: Diagnosis not present

## 2022-07-02 DIAGNOSIS — F319 Bipolar disorder, unspecified: Secondary | ICD-10-CM | POA: Diagnosis not present

## 2022-07-11 DIAGNOSIS — J449 Chronic obstructive pulmonary disease, unspecified: Secondary | ICD-10-CM | POA: Diagnosis not present

## 2022-07-11 DIAGNOSIS — Z125 Encounter for screening for malignant neoplasm of prostate: Secondary | ICD-10-CM | POA: Diagnosis not present

## 2022-07-11 DIAGNOSIS — F02A0 Dementia in other diseases classified elsewhere, mild, without behavioral disturbance, psychotic disturbance, mood disturbance, and anxiety: Secondary | ICD-10-CM | POA: Diagnosis not present

## 2022-07-11 DIAGNOSIS — I2510 Atherosclerotic heart disease of native coronary artery without angina pectoris: Secondary | ICD-10-CM | POA: Diagnosis not present

## 2022-07-11 DIAGNOSIS — J9610 Chronic respiratory failure, unspecified whether with hypoxia or hypercapnia: Secondary | ICD-10-CM | POA: Diagnosis not present

## 2022-08-07 DIAGNOSIS — Z23 Encounter for immunization: Secondary | ICD-10-CM | POA: Diagnosis not present

## 2022-08-07 DIAGNOSIS — N182 Chronic kidney disease, stage 2 (mild): Secondary | ICD-10-CM | POA: Diagnosis not present

## 2022-08-15 DIAGNOSIS — D72829 Elevated white blood cell count, unspecified: Secondary | ICD-10-CM | POA: Diagnosis not present

## 2022-08-15 DIAGNOSIS — G20C Parkinsonism, unspecified: Secondary | ICD-10-CM | POA: Diagnosis not present

## 2022-08-15 DIAGNOSIS — R55 Syncope and collapse: Secondary | ICD-10-CM | POA: Diagnosis not present

## 2022-08-15 DIAGNOSIS — I252 Old myocardial infarction: Secondary | ICD-10-CM | POA: Diagnosis not present

## 2022-08-15 DIAGNOSIS — J449 Chronic obstructive pulmonary disease, unspecified: Secondary | ICD-10-CM | POA: Diagnosis not present

## 2022-08-15 DIAGNOSIS — F319 Bipolar disorder, unspecified: Secondary | ICD-10-CM | POA: Diagnosis not present

## 2022-08-15 DIAGNOSIS — Z8673 Personal history of transient ischemic attack (TIA), and cerebral infarction without residual deficits: Secondary | ICD-10-CM | POA: Diagnosis not present

## 2022-08-25 ENCOUNTER — Emergency Department: Payer: Medicare HMO

## 2022-08-25 ENCOUNTER — Inpatient Hospital Stay: Payer: Medicare HMO

## 2022-08-25 ENCOUNTER — Other Ambulatory Visit: Payer: Self-pay

## 2022-08-25 ENCOUNTER — Inpatient Hospital Stay
Admission: EM | Admit: 2022-08-25 | Discharge: 2022-08-27 | DRG: 689 | Disposition: A | Discharge status: Home Health | Payer: Medicare HMO | Attending: Internal Medicine | Admitting: Internal Medicine

## 2022-08-25 ENCOUNTER — Encounter: Payer: Self-pay | Admitting: Internal Medicine

## 2022-08-25 DIAGNOSIS — Z87891 Personal history of nicotine dependence: Secondary | ICD-10-CM | POA: Diagnosis not present

## 2022-08-25 DIAGNOSIS — A419 Sepsis, unspecified organism: Secondary | ICD-10-CM | POA: Diagnosis present

## 2022-08-25 DIAGNOSIS — R059 Cough, unspecified: Secondary | ICD-10-CM | POA: Diagnosis not present

## 2022-08-25 DIAGNOSIS — N179 Acute kidney failure, unspecified: Secondary | ICD-10-CM | POA: Diagnosis not present

## 2022-08-25 DIAGNOSIS — G20A1 Parkinson's disease without dyskinesia, without mention of fluctuations: Secondary | ICD-10-CM | POA: Diagnosis present

## 2022-08-25 DIAGNOSIS — Z79899 Other long term (current) drug therapy: Secondary | ICD-10-CM | POA: Diagnosis not present

## 2022-08-25 DIAGNOSIS — B952 Enterococcus as the cause of diseases classified elsewhere: Secondary | ICD-10-CM | POA: Diagnosis present

## 2022-08-25 DIAGNOSIS — R000 Tachycardia, unspecified: Secondary | ICD-10-CM | POA: Diagnosis not present

## 2022-08-25 DIAGNOSIS — Z7951 Long term (current) use of inhaled steroids: Secondary | ICD-10-CM

## 2022-08-25 DIAGNOSIS — E860 Dehydration: Secondary | ICD-10-CM | POA: Diagnosis not present

## 2022-08-25 DIAGNOSIS — I2510 Atherosclerotic heart disease of native coronary artery without angina pectoris: Secondary | ICD-10-CM | POA: Diagnosis present

## 2022-08-25 DIAGNOSIS — Z7982 Long term (current) use of aspirin: Secondary | ICD-10-CM | POA: Diagnosis not present

## 2022-08-25 DIAGNOSIS — Z20822 Contact with and (suspected) exposure to covid-19: Secondary | ICD-10-CM | POA: Diagnosis not present

## 2022-08-25 DIAGNOSIS — J432 Centrilobular emphysema: Secondary | ICD-10-CM | POA: Diagnosis not present

## 2022-08-25 DIAGNOSIS — F319 Bipolar disorder, unspecified: Secondary | ICD-10-CM | POA: Diagnosis not present

## 2022-08-25 DIAGNOSIS — G2581 Restless legs syndrome: Secondary | ICD-10-CM | POA: Diagnosis present

## 2022-08-25 DIAGNOSIS — G629 Polyneuropathy, unspecified: Secondary | ICD-10-CM | POA: Diagnosis present

## 2022-08-25 DIAGNOSIS — G9341 Metabolic encephalopathy: Secondary | ICD-10-CM | POA: Diagnosis present

## 2022-08-25 DIAGNOSIS — R0689 Other abnormalities of breathing: Secondary | ICD-10-CM | POA: Diagnosis not present

## 2022-08-25 DIAGNOSIS — I10 Essential (primary) hypertension: Secondary | ICD-10-CM | POA: Diagnosis not present

## 2022-08-25 DIAGNOSIS — K219 Gastro-esophageal reflux disease without esophagitis: Secondary | ICD-10-CM | POA: Diagnosis present

## 2022-08-25 DIAGNOSIS — Z809 Family history of malignant neoplasm, unspecified: Secondary | ICD-10-CM

## 2022-08-25 DIAGNOSIS — I252 Old myocardial infarction: Secondary | ICD-10-CM

## 2022-08-25 DIAGNOSIS — N390 Urinary tract infection, site not specified: Secondary | ICD-10-CM | POA: Diagnosis not present

## 2022-08-25 DIAGNOSIS — J449 Chronic obstructive pulmonary disease, unspecified: Secondary | ICD-10-CM | POA: Diagnosis present

## 2022-08-25 DIAGNOSIS — R531 Weakness: Secondary | ICD-10-CM | POA: Diagnosis not present

## 2022-08-25 DIAGNOSIS — R1010 Upper abdominal pain, unspecified: Secondary | ICD-10-CM | POA: Diagnosis not present

## 2022-08-25 DIAGNOSIS — E785 Hyperlipidemia, unspecified: Secondary | ICD-10-CM | POA: Diagnosis not present

## 2022-08-25 DIAGNOSIS — G319 Degenerative disease of nervous system, unspecified: Secondary | ICD-10-CM | POA: Diagnosis not present

## 2022-08-25 DIAGNOSIS — R4182 Altered mental status, unspecified: Secondary | ICD-10-CM | POA: Diagnosis not present

## 2022-08-25 DIAGNOSIS — R0902 Hypoxemia: Secondary | ICD-10-CM | POA: Diagnosis not present

## 2022-08-25 DIAGNOSIS — R29818 Other symptoms and signs involving the nervous system: Secondary | ICD-10-CM | POA: Diagnosis present

## 2022-08-25 DIAGNOSIS — K8020 Calculus of gallbladder without cholecystitis without obstruction: Secondary | ICD-10-CM | POA: Diagnosis not present

## 2022-08-25 DIAGNOSIS — Z8673 Personal history of transient ischemic attack (TIA), and cerebral infarction without residual deficits: Secondary | ICD-10-CM

## 2022-08-25 DIAGNOSIS — G459 Transient cerebral ischemic attack, unspecified: Secondary | ICD-10-CM | POA: Diagnosis present

## 2022-08-25 DIAGNOSIS — N281 Cyst of kidney, acquired: Secondary | ICD-10-CM | POA: Diagnosis not present

## 2022-08-25 LAB — COMPREHENSIVE METABOLIC PANEL
ALT: 24 U/L (ref 0–44)
AST: 26 U/L (ref 15–41)
Albumin: 4.5 g/dL (ref 3.5–5.0)
Alkaline Phosphatase: 91 U/L (ref 38–126)
Anion gap: 16 — ABNORMAL HIGH (ref 5–15)
BUN: 79 mg/dL — ABNORMAL HIGH (ref 8–23)
CO2: 32 mmol/L (ref 22–32)
Calcium: 10 mg/dL (ref 8.9–10.3)
Chloride: 87 mmol/L — ABNORMAL LOW (ref 98–111)
Creatinine, Ser: 2.38 mg/dL — ABNORMAL HIGH (ref 0.61–1.24)
GFR, Estimated: 28 mL/min — ABNORMAL LOW (ref >60)
Glucose, Bld: 153 mg/dL — ABNORMAL HIGH (ref 70–99)
Potassium: 4.2 mmol/L (ref 3.5–5.1)
Sodium: 135 mmol/L (ref 135–145)
Total Bilirubin: 1 mg/dL (ref 0.3–1.2)
Total Protein: 9 g/dL — ABNORMAL HIGH (ref 6.5–8.1)

## 2022-08-25 LAB — URINALYSIS, ROUTINE W REFLEX MICROSCOPIC
Bilirubin Urine: NEGATIVE
Glucose, UA: NEGATIVE mg/dL
Ketones, ur: NEGATIVE mg/dL
Nitrite: NEGATIVE
Protein, ur: 30 mg/dL — AB
Specific Gravity, Urine: 1.018 (ref 1.005–1.030)
WBC, UA: >50 WBC/hpf — ABNORMAL HIGH (ref 0–5)
pH: 5 (ref 5.0–8.0)

## 2022-08-25 LAB — LACTIC ACID, PLASMA
Lactic Acid, Venous: 1.6 mmol/L (ref 0.5–1.9)
Lactic Acid, Venous: 2.8 mmol/L (ref 0.5–1.9)

## 2022-08-25 LAB — CBC
HCT: 50.5 % (ref 39.0–52.0)
Hemoglobin: 17.4 g/dL — ABNORMAL HIGH (ref 13.0–17.0)
MCH: 30.1 pg (ref 26.0–34.0)
MCHC: 34.5 g/dL (ref 30.0–36.0)
MCV: 87.4 fL (ref 80.0–100.0)
Platelets: 227 10*3/uL (ref 150–400)
RBC: 5.78 MIL/uL (ref 4.22–5.81)
RDW: 11.5 % (ref 11.5–15.5)
WBC: 15.2 10*3/uL — ABNORMAL HIGH (ref 4.0–10.5)
nRBC: 0 % (ref 0.0–0.2)

## 2022-08-25 LAB — PROCALCITONIN: Procalcitonin: <0.1 ng/mL

## 2022-08-25 LAB — RESP PANEL BY RT-PCR (FLU A&B, COVID) ARPGX2
Influenza A by PCR: NEGATIVE
Influenza B by PCR: NEGATIVE
SARS Coronavirus 2 by RT PCR: NEGATIVE

## 2022-08-25 LAB — LIPASE, BLOOD: Lipase: 51 U/L (ref 11–51)

## 2022-08-25 MED ORDER — ONDANSETRON HCL 4 MG PO TABS: 4.0000 mg | ORAL_TABLET | Freq: Four times a day (QID) | ORAL | Status: DC | PRN

## 2022-08-25 MED ORDER — SODIUM CHLORIDE 0.9 % IV BOLUS
1000.0000 mL | Freq: Once | INTRAVENOUS | Status: AC
  Administered 2022-08-25: 1000 mL via INTRAVENOUS

## 2022-08-25 MED ORDER — SODIUM CHLORIDE 0.9 % IV SOLN: 2.0000 g | INTRAVENOUS | Status: DC

## 2022-08-25 MED ORDER — SODIUM CHLORIDE 0.9 % IV SOLN
1.0000 g | Freq: Once | INTRAVENOUS | Status: AC
  Administered 2022-08-25: 1 g via INTRAVENOUS
  Filled 2022-08-25: qty 10

## 2022-08-25 MED ORDER — ATORVASTATIN CALCIUM 20 MG PO TABS
80.0000 mg | ORAL_TABLET | Freq: Every day | ORAL | Status: DC
  Administered 2022-08-25 – 2022-08-26 (×2): 80 mg via ORAL
  Filled 2022-08-25 (×2): qty 4

## 2022-08-25 MED ORDER — PANTOPRAZOLE SODIUM 40 MG PO TBEC
40.0000 mg | DELAYED_RELEASE_TABLET | Freq: Two times a day (BID) | ORAL | Status: DC
  Administered 2022-08-25 – 2022-08-27 (×5): 40 mg via ORAL
  Filled 2022-08-25 (×5): qty 1

## 2022-08-25 MED ORDER — DIVALPROEX SODIUM ER 500 MG PO TB24
500.0000 mg | ORAL_TABLET | Freq: Every day | ORAL | Status: DC
  Administered 2022-08-25 – 2022-08-27 (×3): 500 mg via ORAL
  Filled 2022-08-25 (×2): qty 1
  Filled 2022-08-25: qty 2

## 2022-08-25 MED ORDER — VITAMIN B-12 1000 MCG PO TABS
1000.0000 ug | ORAL_TABLET | Freq: Every day | ORAL | Status: DC
  Administered 2022-08-25 – 2022-08-27 (×3): 1000 ug via ORAL
  Filled 2022-08-25: qty 1
  Filled 2022-08-25: qty 2
  Filled 2022-08-25: qty 1

## 2022-08-25 MED ORDER — HEPARIN SODIUM (PORCINE) 5000 UNIT/ML IJ SOLN
5000.0000 [IU] | Freq: Three times a day (TID) | INTRAMUSCULAR | Status: DC
  Administered 2022-08-25 – 2022-08-27 (×6): 5000 [IU] via SUBCUTANEOUS
  Filled 2022-08-25 (×6): qty 1

## 2022-08-25 MED ORDER — SPIRONOLACTONE 25 MG PO TABS
25.0000 mg | ORAL_TABLET | Freq: Every day | ORAL | Status: DC
  Administered 2022-08-25 – 2022-08-27 (×3): 25 mg via ORAL
  Filled 2022-08-25 (×3): qty 1

## 2022-08-25 MED ORDER — VANCOMYCIN HCL 500 MG/100ML IV SOLN: 500.0000 mg | INTRAVENOUS | Status: DC

## 2022-08-25 MED ORDER — DILTIAZEM HCL ER COATED BEADS 180 MG PO CP24
180.0000 mg | ORAL_CAPSULE | Freq: Every day | ORAL | Status: DC
  Administered 2022-08-25 – 2022-08-27 (×3): 180 mg via ORAL
  Filled 2022-08-25 (×3): qty 1

## 2022-08-25 MED ORDER — CARBAMAZEPINE 200 MG PO TABS
200.0000 mg | ORAL_TABLET | Freq: Two times a day (BID) | ORAL | Status: DC
  Administered 2022-08-25 – 2022-08-27 (×5): 200 mg via ORAL
  Filled 2022-08-25 (×5): qty 1

## 2022-08-25 MED ORDER — IPRATROPIUM-ALBUTEROL 0.5-2.5 (3) MG/3ML IN SOLN: 3.0000 mL | Freq: Four times a day (QID) | RESPIRATORY_TRACT | Status: DC | PRN

## 2022-08-25 MED ORDER — SODIUM CHLORIDE 0.9 % IV BOLUS
500.0000 mL | Freq: Once | INTRAVENOUS | Status: AC
  Administered 2022-08-25: 500 mL via INTRAVENOUS

## 2022-08-25 MED ORDER — ASPIRIN 81 MG PO TBEC
81.0000 mg | DELAYED_RELEASE_TABLET | Freq: Every day | ORAL | Status: DC
  Administered 2022-08-25 – 2022-08-27 (×3): 81 mg via ORAL
  Filled 2022-08-25 (×3): qty 1

## 2022-08-25 MED ORDER — MOMETASONE FURO-FORMOTEROL FUM 100-5 MCG/ACT IN AERO
2.0000 | INHALATION_SPRAY | Freq: Two times a day (BID) | RESPIRATORY_TRACT | Status: DC
  Administered 2022-08-26 – 2022-08-27 (×3): 2 via RESPIRATORY_TRACT
  Filled 2022-08-25 (×2): qty 8.8

## 2022-08-25 MED ORDER — MONTELUKAST SODIUM 10 MG PO TABS
10.0000 mg | ORAL_TABLET | Freq: Every day | ORAL | Status: DC
  Administered 2022-08-25 – 2022-08-26 (×2): 10 mg via ORAL
  Filled 2022-08-25 (×2): qty 1

## 2022-08-25 MED ORDER — ONDANSETRON HCL 4 MG/2ML IJ SOLN: 4.0000 mg | Freq: Four times a day (QID) | INTRAMUSCULAR | Status: DC | PRN

## 2022-08-25 MED ORDER — VANCOMYCIN HCL 1500 MG/300ML IV SOLN
1500.0000 mg | Freq: Once | INTRAVENOUS | Status: AC
  Administered 2022-08-26: 1500 mg via INTRAVENOUS
  Filled 2022-08-25: qty 300

## 2022-08-25 MED ORDER — ACETAMINOPHEN 650 MG RE SUPP: 650.0000 mg | Freq: Four times a day (QID) | RECTAL | Status: DC | PRN

## 2022-08-25 MED ORDER — SODIUM CHLORIDE 0.9 % IV SOLN
2.0000 g | INTRAVENOUS | Status: DC
  Administered 2022-08-25: 2 g via INTRAVENOUS
  Filled 2022-08-25: qty 2

## 2022-08-25 MED ORDER — DONEPEZIL HCL 5 MG PO TABS
10.0000 mg | ORAL_TABLET | Freq: Every day | ORAL | Status: DC
  Administered 2022-08-25 – 2022-08-26 (×2): 10 mg via ORAL
  Filled 2022-08-25 (×2): qty 2

## 2022-08-25 MED ORDER — ACETAMINOPHEN 325 MG PO TABS: 650.0000 mg | ORAL_TABLET | Freq: Four times a day (QID) | ORAL | Status: DC | PRN

## 2022-08-25 MED ORDER — ALBUTEROL SULFATE (2.5 MG/3ML) 0.083% IN NEBU: 2.5000 mg | INHALATION_SOLUTION | RESPIRATORY_TRACT | Status: DC | PRN

## 2022-08-25 MED ORDER — SODIUM CHLORIDE 0.9 % IV SOLN: Freq: Once | INTRAVENOUS | Status: AC

## 2022-08-25 MED ORDER — TIMOLOL MALEATE 0.5 % OP SOLN
1.0000 [drp] | Freq: Two times a day (BID) | OPHTHALMIC | Status: DC
  Administered 2022-08-26 – 2022-08-27 (×2): 1 [drp] via OPHTHALMIC
  Filled 2022-08-25 (×2): qty 5

## 2022-08-25 NOTE — Assessment & Plan Note (Signed)
Stable, no active CP. Continue ASA.  Appears no longer on Plavix per med history.

## 2022-08-25 NOTE — Hospital Course (Signed)
Mr. Michoel Kunin is a 76 year old male with history of CVA, hypertension, Parkinson's, hyperlipidemia, who presents emergency department for chief concerns of altered mental status, difficulty walking.  Initial vitals in the emergency department showed temperature of 97.7, respiration rate of 16, heart rate 77, blood pressure 149/84, SPO2 94% on 2 L nasal cannula.  Serum sodium 135, potassium 4.2, chloride of 87, bicarb 32, BUN of 79, serum creatinine of 2.38, nonfasting blood glucose 153, WBC 15.2, hemoglobin 17.4, platelets of 227.  eGFR of 28.  UA was positive for moderate leukocytes.  CT abdomen pelvis without contrast: Was read as no acute abdominal etiology for pain identified.  Mildly complicated appearing cluster of hypodense masses in the superior pole of the right kidney which began approximately 12 mm x 16 mm.  May reflect mildly complicated cyst.  ED treatment: Ceftriaxone 1 g IV.

## 2022-08-25 NOTE — Assessment & Plan Note (Addendum)
Continue ASA, Lipitor

## 2022-08-25 NOTE — Consult Note (Signed)
Pharmacy Antibiotic Note  Bradley Bowers is a 76 y.o. male admitted on 08/25/2022 with sepsis.  Pharmacy has been consulted for vancomycin and cefepime dosing.  Plan: Vancomcyin 1500 mg IV loading dose, followed by vancomycin 500 mg IV q24h Goal AUC 400-550  Est AUC: 532.2 Est Cmax: 28.1 Est Cmin: 17.3 Calculated with SCr 2.38  Cefepime 2 g IV q24h per indication and renal function  Height: '5\' 6"'$  (167.6 cm) Weight: 91.6 kg (201 lb 15.1 oz) IBW/kg (Calculated) : 63.8  Temp (24hrs), Avg:97.8 F (36.6 C), Min:97.7 F (36.5 C), Max:98 F (36.7 C)  Recent Labs  Lab 08/25/22 1019 08/25/22 1459 08/25/22 1837  WBC 15.2*  --   --   CREATININE 2.38*  --   --   LATICACIDVEN  --  1.6 2.8*    Estimated Creatinine Clearance: 28.8 mL/min (A) (by C-G formula based on SCr of 2.38 mg/dL (H)).    No Known Allergies  Antimicrobials this admission: Ceftriaxone x1 in the ED Vancomycin >>  Cefepime >>   Dose adjustments this admission:   Microbiology results: 11/19 BCx: pending 11/19 UCx: pending   Thank you for allowing pharmacy to be a part of this patient's care.  Darnelle Bos, PharmD 08/25/2022 9:39 PM

## 2022-08-25 NOTE — Assessment & Plan Note (Signed)
-  Continue Lipitor °

## 2022-08-25 NOTE — ED Triage Notes (Signed)
C/O 'staggering' and losing balance when walking, even with walker x 3-4 days.  And generalized abdominal pain.  STates vomited x 1 on Friday and also productive cough.  AAOx3.  Skin warm and dry. NAD.  No SOB/ DOe

## 2022-08-25 NOTE — Assessment & Plan Note (Signed)
Cr on admission was 2.38 >> improved to 1.65. No known hx of CKD.  Baseline Cr six months ago was 1.1-1.3. --Cr normalized with IV fluids --Monitor BMP at follow up

## 2022-08-25 NOTE — H&P (Addendum)
History and Physical   Bradley Bowers UVO:[id number] DOB: 06-09-1946 DOA: 08/25/2022  PCP: Bradley Aus, MD  Outpatient Specialists: Dr. Jennings Bowers, neurology Patient coming from: Home via EMS  I have personally briefly reviewed patient's old medical records in [place].  Chief Concern: Abdominal pain and difficulty walking  HPI: Bradley Bowers is a 76 year old male with history of CVA, hypertension, Parkinson's, hyperlipidemia, who presents emergency department for chief concerns of altered mental status, difficulty walking.  Initial vitals in the emergency department showed temperature of 97.7, respiration rate of 16, heart rate 77, blood pressure 149/84, SPO2 94% on 2 L nasal cannula.  Serum sodium 135, potassium 4.2, chloride of 87, bicarb 32, BUN of 79, serum creatinine of 2.38, nonfasting blood glucose 153, WBC 15.2, hemoglobin 17.4, platelets of 227.  eGFR of 28.  UA was positive for moderate leukocytes.  CT abdomen pelvis without contrast: Was read as no acute abdominal etiology for pain identified.  Mildly complicated appearing cluster of hypodense masses in the superior pole of the right kidney which began approximately 12 mm x 16 mm.  May reflect mildly complicated cyst.  ED treatment: Ceftriaxone 1 g IV.  At bedside, he was able to tell me his name, age, current location, current calendar year.   He reports his abdomen has been hurting him for about 3-4 days. He endorses lost of appetite. He denies chest pain, shortness of breath, fever, cough, dysuria, hematuria, diarrhea. He endorses baseline swelling of lower extremities that resolve with sleep.   He endorses decreased urine output.  He denies diarrhea, syncope, loss of consciousness, trauma to his person.  Social history: he lives at home with his wife. He denies tobacco, etoh, recreational drug use. He is retired and formerly worked in a hosiery.   ROS: Constitutional: no weight change, no  fever ENT/Mouth: no sore throat, no rhinorrhea Eyes: no eye pain, no vision changes Cardiovascular: no chest pain, no dyspnea,  no edema, no palpitations Respiratory: no cough, no sputum, no wheezing Gastrointestinal: no nausea, no vomiting, no diarrhea, no constipation Genitourinary: no urinary incontinence, no dysuria, no hematuria, abdominal tenderness Musculoskeletal: no arthralgias, no myalgias Skin: no skin lesions, no pruritus, Neuro: + weakness, no loss of consciousness, no syncope Psych: no anxiety, no depression, + decrease appetite Heme/Lymph: no bruising, no bleeding  ED Course: Discussed with emergency medicine provider, patient requiring hospitalization for chief concerns of altered mental status secondary to UTI.  Assessment/Plan  Principal Problem:   Sepsis secondary to UTI [facility]) Active Problems:   TIA (transient ischemic attack)   Bipolar 1 disorder (HCC)   Chronic obstructive pulmonary disease (COPD) (HCC)   Gastro-esophageal reflux disease without esophagitis   Restless leg   Coronary artery disease   Hyperlipemia   Essential hypertension   Peripheral neuropathy   Parkinsonian features   AKI (acute kidney injury) ([place])   Assessment and Plan:  * Sepsis secondary to UTI ([place]) - With metabolic encephalopathy - Patient may meet criteria for severe sepsis, patient has leukocytosis, source identified, and renal involvement - Leukocytosis is 15.2, source is urine, checking lactic acid and procalcitonin - Blood cultures x2 will be ordered if lactic acid are elevated - Sodium chloride 1 L bolus - Added urine culture - Ceftriaxone 2 g IV daily starting on 08/26/2022 - Admit to telemetry medical, inpatient  TIA (transient ischemic attack) - Resumed aspirin 81 mg daily, atorvastatin 80 mg nightly  Bipolar 1 disorder (HCC) - Depakote 500 mg daily  resumed  Chronic obstructive pulmonary disease (COPD) ([place]) - Not in acute exacerbation - Resumed home maintenance  inhaler equivalent with Dulera 2 puffs inhalation twice daily, DuoNebs every 6 hours.  For wheezing and shortness of breath ordered  AKI (acute kidney injury) ([place]) - With no prior CKD  Parkinsonian features - Resumed home donepezil 10 mg nightly, Depakote 500 mg daily, carbamazepine 200 mg p.o. twice daily  Essential hypertension - Resumed home spironolactone 25 mg daily, diltiazem 180 mg daily - Holding home hydrochlorothiazide 25 mg daily due to acute kidney injury, a.m. team to resume when appropriate  Hyperlipemia - Atorvastatin 80 mg nightly resumed  Coronary artery disease - Resumed aspirin 81 mg daily - Per med reconciliation, patient has discontinued Plavix due to provider  Gastro-esophageal reflux disease without esophagitis - PPI resumed  Chart reviewed.   DVT prophylaxis: Heparin 5000 units subcutaneous every 8 hours Code Status: full code  Diet: Heart healthy Family Communication: No Disposition Plan: Pending clinical course Consults called: None at this time Admission status: Telemetry medical, inpatient  Past Medical History:  Diagnosis Date   Arterial vascular disease 10/24/2014   Overview:  MI 1994, small-vessel disease requiring coumadin    Asthma    Centrilobular emphysema ([place]) 10/24/2014   Overview:  2L O2 prn    Centrilobular emphysema (HCC)    Chronic obstructive pulmonary disease ([place]) 11/06/2015   Coronary artery disease    GERD (gastroesophageal reflux disease)    Glaucoma    Heart attack ([place]) 2/17, 1994   Heart disease    Hyperlipemia    Hypertension    Restless leg 10/24/2014   Sleep apnea    Past Surgical History:  Procedure Laterality Date   CATARACT EXTRACTION W/ INTRAOCULAR LENS  IMPLANT, BILATERAL     COLONOSCOPY WITH PROPOFOL N/A 02/19/2018   Procedure: COLONOSCOPY WITH PROPOFOL;  Surgeon: Lollie Sails, MD;  Location[facility] ENDOSCOPY;  Service: Endoscopy;  Laterality: N/A;   ESOPHAGOGASTRODUODENOSCOPY (EGD) WITH PROPOFOL N/A  02/19/2018   Procedure: ESOPHAGOGASTRODUODENOSCOPY (EGD) WITH PROPOFOL;  Surgeon: Lollie Sails, MD;  Location: [facility] ENDOSCOPY;  Service: Endoscopy;  Laterality: N/A;   EYE SURGERY     Social History:  reports that he quit smoking about 28 years ago. His smoking use included cigarettes. He has a 30.00 pack-year smoking history. He has never used smokeless tobacco. He reports that he does not drink alcohol and does not use drugs.  No Known Allergies Family History  Problem Relation Age of Onset   Cancer Mother    Nephrolithiasis Father    Prostate cancer Neg Hx    Bladder Cancer Neg Hx    Kidney cancer Neg Hx    Family history: Family history reviewed and not pertinent  Prior to Admission medications   Medication Sig Start Date End Date Taking? Authorizing Provider  ADVAIR DISKUS 100-50 MCG/ACT AEPB Inhale 1 puff into the lungs 2 (two) times daily. 01/10/21   [provider]  albuterol (VENTOLIN HFA) 108 (90 Base) MCG/ACT inhaler Inhale 1-2 puffs into the lungs every 4 (four) hours as needed.    [provider]  aspirin 81 MG EC tablet Take 81 mg by mouth daily. 11/14/15   [provider]  atenolol (TENORMIN) 25 MG tablet Take 0.5 tablets (12.5 mg total) by mouth daily. 02/05/22   Richarda Osmond, MD  atorvastatin (LIPITOR) 80 MG tablet Take 1 tablet (80 mg total) by mouth daily. 02/04/22 03/06/22  Richarda Osmond, MD  carbamazepine (TEGRETOL)  200 MG tablet Take 1 tablet by mouth 2 (two) times daily. 01/12/21 01/29/22  [provider]  divalproex (DEPAKOTE ER) 500 MG 24 hr tablet Take 3 tablets (1,500 mg total) by mouth at bedtime. 10/06/16   Pucilowska, Jolanta B, MD  donepezil (ARICEPT) 5 MG tablet Take 10 mg by mouth at bedtime. 02/28/21 02/28/22  [provider]  fluticasone (FLONASE) 50 MCG/ACT nasal spray Place 1 spray into both nostrils daily.    [provider]  fluticasone furoate-vilanterol (BREO ELLIPTA) 100-25 MCG/INH AEPB  Inhale 1 puff into the lungs daily. 02/28/21   [provider]  lisinopril (ZESTRIL) 2.5 MG tablet Take 1 tablet (2.5 mg total) by mouth daily. 02/04/22 03/06/22  Richarda Osmond, MD  montelukast (SINGULAIR) 10 MG tablet Take 10 mg by mouth at bedtime.    [provider]  OLANZapine (ZYPREXA) 5 MG tablet Take 5 mg by mouth at bedtime. 11/22/20   [provider]  timolol (TIMOPTIC) 0.5 % ophthalmic solution 1 drop 2 (two) times daily.    [provider]  tiotropium (SPIRIVA) 18 MCG inhalation capsule Place 18 mcg into inhaler and inhale daily.    [provider]  vitamin B-12 1000 MCG tablet Take 1 tablet (1,000 mcg total) by mouth daily. 10/07/16   Clovis Fredrickson, MD   Physical Exam: Vitals:   08/25/22 1200 08/25/22 1230 08/25/22 1300 08/25/22 1454  BP: (!) 149/75 (!) 132/108 (!) 151/79 (!) 150/102  Pulse: 67 66 67 72  Resp: _0 Temp:      TempSrc:      SpO2: 97% 97% 97% 98%  Weight:      Height:       Constitutional: appears age-appropriate, NAD, calm, comfortable Eyes: PERRL, lids and conjunctivae normal ENMT: Mucous membranes are moist. Posterior pharynx clear of any exudate or lesions. Age-appropriate dentition. Hearing appropriate Neck: normal, supple, no masses, no thyromegaly Respiratory: clear to auscultation bilaterally, no wheezing, no crackles. Normal respiratory effort. No accessory muscle use.  Cardiovascular: Regular rate and rhythm, no murmurs / rubs / gallops. No extremity edema. 2+ pedal pulses. No carotid bruits.  Abdomen: Obese back to mid, mild/minimal tenderness with deep palpation, no masses palpated, no hepatosplenomegaly. Bowel sounds positive.  Musculoskeletal: no clubbing / cyanosis. No joint deformity upper and lower extremities. Good ROM, no contractures, no atrophy. Normal muscle tone.  Skin: no rashes, lesions, ulcers. No induration Neurologic: Sensation intact. Strength 5/5 in all 4.  Psychiatric:  Normal judgment and insight. Alert and oriented x 3. Normal mood.   EKG: independently reviewed, showing sinus rhythm with rate of 74, QTc 430  Chest x-ray on Admission: I personally reviewed and I agree with radiologist reading as below.  CT ABDOMEN PELVIS WO CONTRAST  Result Date: 08/25/2022 CLINICAL DATA:  Abdominal pain, acute, nonlocalized EXAM: CT ABDOMEN AND PELVIS WITHOUT CONTRAST TECHNIQUE: Multidetector CT imaging of the abdomen and pelvis was performed following the standard protocol without IV contrast. RADIATION DOSE REDUCTION: This exam was performed according to the departmental dose-optimization program which includes automated exposure control, adjustment of the mA and/or kV according to patient size and/or use of iterative reconstruction technique. COMPARISON:  June 22, 2021 FINDINGS: Evaluation is limited by lack of IV contrast. Lower chest: Evaluation is limited by motion. There is a 7 mm x 4 mm LEFT lower lobe pulmonary nodule, new since prior (series 4, image 7). There is some faint additional RIGHT lower lobe 3 mm pulmonary nodules, incompletely  assessed due to motion. Hepatobiliary: Cholelithiasis within the gallbladder neck. No evidence of acute cholecystitis. Unremarkable noncontrast appearance of the liver. Pancreas: No peripancreatic fat stranding. Spleen: Unremarkable. Adrenals/Urinary Tract: Adrenal glands are unremarkable. No hydronephrosis. No obstructing nephrolithiasis. There is a low-density mass of the inferior pole the RIGHT kidney measuring 6 cm, previously characterized sonographically as a benign cyst (for which no dedicated imaging follow-up is recommended) . There is a mildly complicated appearing cluster of hypodense masses of the superior pole of the RIGHT kidney which span approximately 12 mm by 16 mm (series 2, image 22; series 6, image 44). Bladder is decompressed. Stomach/Bowel: No evidence of bowel obstruction. Sigmoid diverticulosis without evidence of  acute diverticulitis. There is a prominent appearance of the mucosa of the sigmoid colon likely reflecting a degree of chronic diverticulitis. Appendix is normal. Stomach is decompressed. Vascular/Lymphatic: Atherosclerotic calcifications of the nonaneurysmal abdominal aorta. No suspicious lymphadenopathy. Retroaortic LEFT renal vein. Reproductive: Prostate is present. Other: No free air or free fluid. Musculoskeletal: Moderate degenerative changes of the lower lumbar spine. IMPRESSION: 1. No CT etiology for acute abdominal pain is identified. 2. There is a mildly complicated appearing cluster of hypodense masses of the superior pole of the RIGHT kidney which span approximately 12 mm by 16 mm. This likely reflects mildly complicated cyst. Consider follow-up renal protocol MRI with and without contrast for definitive characterization versus follow-up nonemergent renal ultrasound. 3. There is a 7 mm x 4 mm LEFT lower lobe pulmonary nodule, new since prior. Per Fleischner Society Guidelines, recommend a non-contrast Chest CT at 3-6 months, then consider another non-contrast Chest CT at 18-24 months. If patient is low risk for malignancy, non-contrast Chest CT at 18-24 months is optional. These guidelines do not apply to immunocompromised patients and patients with cancer. Follow up in patients with significant comorbidities as clinically warranted. For lung cancer screening, adhere to Lung-RADS guidelines. Reference: Radiology. 2017; 284(1):228-43. 4. Cholelithiasis without evidence of acute cholecystitis. 5. Sigmoid diverticulosis without evidence of acute diverticulitis. There is a prominent appearance of the mucosa of the sigmoid colon likely reflecting a degree of chronic diverticulitis. Recommend correlation with colonoscopy screening history. Aortic Atherosclerosis (ICD10-I70.0). Electronically Signed   By: Valentino Saxon M.D.   On: 08/25/2022 13:50   DG Chest Port 1 View  Result Date:  08/25/2022 CLINICAL DATA:  76 year old male with history of cough and weakness. EXAM: PORTABLE CHEST 1 VIEW COMPARISON:  Chest x-ray 05/22/2021. FINDINGS: Lung volumes are normal. No consolidative airspace disease. No pleural effusions. No pneumothorax. No pulmonary nodule or mass noted. Pulmonary vasculature and the cardiomediastinal silhouette are within normal limits. IMPRESSION: No radiographic evidence of acute cardiopulmonary disease. Electronically Signed   By: Vinnie Langton M.D.   On: 08/25/2022 11:35    Labs on Admission: I have personally reviewed following labs  CBC: Recent Labs  Lab 08/25/22 1019  WBC 15.2*  HGB 17.4*  HCT 50.5  MCV 87.4  PLT 016   Basic Metabolic Panel: Recent Labs  Lab 08/25/22 1019  NA 135  K 4.2  CL 87*  CO2 32  GLUCOSE 153*  BUN 79*  CREATININE 2.38*  CALCIUM 10.0   GFR: Estimated Creatinine Clearance: 28.8 mL/min (A) (by C-G formula based on SCr of 2.38 mg/dL (H)).  Liver Function Tests: Recent Labs  Lab 08/25/22 1019  AST 26  ALT 24  ALKPHOS 91  BILITOT 1.0  PROT 9.0*  ALBUMIN 4.5   Recent Labs  Lab 08/25/22 1019  LIPASE 51  Urine analysis:    Component Value Date/Time   COLORURINE YELLOW (A) 08/25/2022 1306   APPEARANCEUR HAZY (A) 08/25/2022 1306   APPEARANCEUR Clear 02/20/2016 1102   LABSPEC 1.018 08/25/2022 1306   PHURINE 5.0 08/25/2022 1306   GLUCOSEU NEGATIVE 08/25/2022 1306   HGBUR MODERATE (A) 08/25/2022 1306   BILIRUBINUR NEGATIVE 08/25/2022 1306   BILIRUBINUR Negative 02/20/2016 [place] 08/25/2022 1306   PROTEINUR 30 (A) 08/25/2022 1306   NITRITE NEGATIVE 08/25/2022 1306   LEUKOCYTESUR MODERATE (A) 08/25/2022 1306   Dr. Tobie Poet [facility]  If 7PM-7AM, please contact overnight-coverage provider If 7AM-7PM, please contact day coverage provider [email or web address]  08/25/2022, 3:20 PM

## 2022-08-25 NOTE — Assessment & Plan Note (Signed)
-   Resumed home donepezil 10 mg nightly, Depakote 500 mg daily, carbamazepine 200 mg p.o. twice daily

## 2022-08-25 NOTE — Assessment & Plan Note (Signed)
Continue Depakote

## 2022-08-25 NOTE — ED Notes (Signed)
Advised nurse that patient has ready bed 

## 2022-08-25 NOTE — Assessment & Plan Note (Signed)
Continue PPI ?

## 2022-08-25 NOTE — Assessment & Plan Note (Addendum)
POA.  Continue Rocephin empirically. Urine culture grew enterococcus faecalsi. Discharge on Augmentin to complete course.  Sepsis ruled out - pt had leukocytosis but no other SIRS criteria.

## 2022-08-25 NOTE — Assessment & Plan Note (Addendum)
--   Continue spironolactone, diltiazem  - Hold HCTZ with AKI, resume at d/c

## 2022-08-25 NOTE — Assessment & Plan Note (Signed)
Stable, no wheezing to suggest acute exacerbation.  Follows with Dr. Lanney Gins. Reports chronic 2 L/min O2 use at baseline. - Resumed home regimen --Monitor --Supplement O2 to keep sats 88-94% --Pulmonology follow up

## 2022-08-25 NOTE — ED Provider Notes (Signed)
[facility] Provider Note    Event Date/Time   First MD Initiated Contact with Patient 08/25/22 1117     (approximate)   History   Weakness   HPI  Bradley Bowers is a 76 y.o. male who presents with complaints of generalized weakness, nausea, vomiting, abdominal discomfort over the last several days.  He states that he feels quite weak and has been having to use a walker again.  No fevers reported.  No diarrhea.     Physical Exam   Triage Vital Signs: ED Triage Vitals  Enc Vitals Group     BP 08/25/22 1015 (!) 149/84     Pulse Rate 08/25/22 1015 77     Resp 08/25/22 1015 16     Temp 08/25/22 1015 97.7 F (36.5 C)     Temp Source 08/25/22 1015 Oral     SpO2 08/25/22 1015 94 %     Weight 08/25/22 1013 91.6 kg (201 lb 15.1 oz)     Height 08/25/22 1013 1.676 m ('5\' 6"'$ )     Head Circumference --      Peak Flow --      Pain Score 08/25/22 1012 7     Pain Loc --      Pain Edu? --      Excl. in [place]? --     Most recent vital signs: Vitals:   08/25/22 1230 08/25/22 1300  BP: (!) 132/108 (!) 151/79  Pulse: 66 67  Resp: 15 11  Temp:    SpO2: 97% 97%     General: Awake, no distress.  CV:  Good peripheral perfusion.  Resp:  Normal effort.  Abd:  No distention.  Mild lower abdominal tenderness palpation Other:  ENT: Dry mucous membranes   ED Results / Procedures / Treatments   Labs (all labs ordered are listed, but only abnormal results are displayed) Labs Reviewed  COMPREHENSIVE METABOLIC PANEL - Abnormal; Notable for the following components:      Result Value   Chloride 87 (*)    Glucose, Bld 153 (*)    BUN 79 (*)    Creatinine, Ser 2.38 (*)    Total Protein 9.0 (*)    GFR, Estimated 28 (*)    Anion gap 16 (*)    All other components within normal limits  CBC - Abnormal; Notable for the following components:   WBC 15.2 (*)    Hemoglobin 17.4 (*)    All other components within normal limits  URINALYSIS, ROUTINE W REFLEX  MICROSCOPIC - Abnormal; Notable for the following components:   Color, Urine YELLOW (*)    APPearance HAZY (*)    Hgb urine dipstick MODERATE (*)    Protein, ur 30 (*)    Leukocytes,Ua MODERATE (*)    WBC, UA >50 (*)    Bacteria, UA RARE (*)    All other components within normal limits  RESP PANEL BY RT-PCR (FLU A&B, COVID) ARPGX2  LIPASE, BLOOD     EKG  ED ECG REPORT I, Lavonia Drafts, the attending physician, personally viewed and interpreted this ECG.  Date: 08/25/2022  Rhythm: normal sinus rhythm QRS Axis: normal Intervals: normal ST/T Wave abnormalities: normal Narrative Interpretation: no evidence of acute ischemia    RADIOLOGY CT abdomen pelvis viewed interpreted by me, no acute abnormality    PROCEDURES:  Critical Care performed:   Procedures   MEDICATIONS ORDERED IN ED: Medications  cefTRIAXone (ROCEPHIN) 1 g in sodium chloride 0.9 % 100  mL IVPB (has no administration in time range)  0.9 %  sodium chloride infusion ( Intravenous New Bag/Given 08/25/22 1233)     IMPRESSION / MDM / ASSESSMENT AND PLAN / ED COURSE  I reviewed the triage vital signs and the nursing notes. Patient's presentation is most consistent with acute presentation with potential threat to life or bodily function.  Patient presents with multiple complaints as detailed above.  Differential includes infection such as pneumonia, UTI, electrolyte abnormality, dehydration, viral illness  We will treat with IV fluids, pending labs, chest x-ray, CT abdomen pelvis  Urinalysis is consistent with UTI  Patient has elevated white blood cell count.  He also has acute kidney injury, 6 months ago his creatinine level is normal, today is 2.38 with elevated BUN likely related to dehydration  CT scan is without acute abnormality.  However given AKI, UTI, generalized weakness will start IV Rocephin and admit the patient to the hospitalist service        FINAL CLINICAL IMPRESSION(S) / ED  DIAGNOSES   Final diagnoses:  AKI (acute kidney injury) ([place])  Lower urinary tract infectious disease  Dehydration     Rx / DC Orders   ED Discharge Orders     None        Note:  This document was prepared using Dragon voice recognition software and may include unintentional dictation errors.   Lavonia Drafts, MD 08/25/22 [phone]

## 2022-08-25 NOTE — Progress Notes (Addendum)
       CROSS COVER NOTE  NAME: SHOJI PERTUIT MRN: [id number] DOB : 02-15-1946 ATTENDING PHYSICIAN: Cox, Amy N, DO    Date of Service   08/25/2022   HPI/Events of Note   Notified of elevated Lactic 2.8 up from 1.6.   Mr. Kasten Leveque is a 76 year old male with history of CVA, hypertension, Parkinson's, hyperlipidemia, who presents emergency department for chief concerns of altered mental status, difficulty walking. He is currently being treated for Sepsis, source thought to be urine. He received 2L NS bolus in [facility] ED.  Interventions   Assessment/Plan:  500 mL NS bolus Blood Culture x2 Repeat Lactic at midnight-->1.5      This document was prepared using Dragon voice recognition software and may include unintentional dictation errors.  Neomia Glass DNP, MBA, FNP-BC Nurse Practitioner [facility] Pager [phone]

## 2022-08-26 DIAGNOSIS — N390 Urinary tract infection, site not specified: Secondary | ICD-10-CM | POA: Diagnosis not present

## 2022-08-26 LAB — CBC
HCT: 44.5 % (ref 39.0–52.0)
Hemoglobin: 15 g/dL (ref 13.0–17.0)
MCH: 30.2 pg (ref 26.0–34.0)
MCHC: 33.7 g/dL (ref 30.0–36.0)
MCV: 89.7 fL (ref 80.0–100.0)
Platelets: 159 10*3/uL (ref 150–400)
RBC: 4.96 MIL/uL (ref 4.22–5.81)
RDW: 11.7 % (ref 11.5–15.5)
WBC: 12.1 10*3/uL — ABNORMAL HIGH (ref 4.0–10.5)
nRBC: 0 % (ref 0.0–0.2)

## 2022-08-26 LAB — BASIC METABOLIC PANEL
Anion gap: 11 (ref 5–15)
BUN: 62 mg/dL — ABNORMAL HIGH (ref 8–23)
CO2: 27 mmol/L (ref 22–32)
Calcium: 9.1 mg/dL (ref 8.9–10.3)
Chloride: 100 mmol/L (ref 98–111)
Creatinine, Ser: 1.65 mg/dL — ABNORMAL HIGH (ref 0.61–1.24)
GFR, Estimated: 43 mL/min — ABNORMAL LOW (ref >60)
Glucose, Bld: 93 mg/dL (ref 70–99)
Potassium: 4.3 mmol/L (ref 3.5–5.1)
Sodium: 138 mmol/L (ref 135–145)

## 2022-08-26 LAB — MAGNESIUM: Magnesium: 3.3 mg/dL — ABNORMAL HIGH (ref 1.7–2.4)

## 2022-08-26 LAB — LACTIC ACID, PLASMA: Lactic Acid, Venous: 1.5 mmol/L (ref 0.5–1.9)

## 2022-08-26 LAB — PHOSPHORUS: Phosphorus: 4.9 mg/dL — ABNORMAL HIGH (ref 2.5–4.6)

## 2022-08-26 MED ORDER — SODIUM CHLORIDE 0.9 % IV SOLN: INTRAVENOUS | Status: DC

## 2022-08-26 MED ORDER — SODIUM CHLORIDE 0.9 % IV SOLN
1.0000 g | INTRAVENOUS | Status: DC
  Administered 2022-08-26: 1 g via INTRAVENOUS
  Filled 2022-08-26: qty 1
  Filled 2022-08-26: qty 10

## 2022-08-26 MED ORDER — SODIUM CHLORIDE 0.9 % IV SOLN
2.0000 g | Freq: Two times a day (BID) | INTRAVENOUS | Status: DC
  Filled 2022-08-26: qty 12.5

## 2022-08-26 NOTE — Evaluation (Signed)
Physical Therapy Evaluation Patient Details Name: Bradley Bowers Bradley Bowers MRN: [id number] DOB: [birth year] Today's Date: 08/26/2022  History of Present Illness  Bradley Bowers is a 76 year old male with history of CVA, hypertension, Parkinson's, hyperlipidemia, who presents emergency department for chief concerns of altered mental status, difficulty walking.   Clinical Impression  Pt admitted with above diagnosis. Pt received supine in bed agreeable to PT with spouse present. Reports at baseline indep to mod-I with gait needing 3WW prior to admission due to progressive weakness. At baseline pt is indep with ADL's/IADL's but relies on spouse for driving.   To date pt resting on 2L/min [place]. Sats > 95% at rest and with mobility. Pt able to exit the bed with bed features, increased time. Pt mildly impulsive with standing without AD and with gait with RW requiring frequent VC's for re-direction to manage lines/leads with fair success. Pt able to amb to <> from bathroom at supervision, perform seated toileting independently, and return to supine in bed declining further gait bouts due to fatigue.  Pt interested in improving balance as strength isn't a current issue for him. Pt educated on benefits of OPPT after [facility] PT is complete to address dynamic gait/balance impairments especially with documented baseline parkinsonianism impairments. Pt and spouse understanding. Encouraged to continue OOB mobility with nursing and use of AD to improve functional mobility to PLOF. Pt understanding and with all needs in reach. Pt currently with functional limitations due to the deficits listed below (see PT Problem List). Pt will benefit from skilled PT to increase their independence and safety with mobility to allow discharge to the venue listed below.           Recommendations for follow up therapy are one component of a multi-disciplinary discharge planning process, led by the attending physician.  Recommendations may be  updated based on patient status, additional functional criteria and insurance authorization.  Follow Up Recommendations Home health PT      Assistance Recommended at Discharge Frequent or constant Supervision/Assistance  Patient can return home with the following  A little help with walking and/or transfers;Assistance with cooking/housework;Assist for transportation;Help with stairs or ramp for entrance;A little help with bathing/dressing/bathroom    Equipment Recommendations None recommended by PT  Recommendations for Other Services       Functional Status Assessment Patient has had a recent decline in their functional status and demonstrates the ability to make significant improvements in function in a reasonable and predictable amount of time.     Precautions / Restrictions Precautions Precautions: Fall Restrictions Weight Bearing Restrictions: No      Mobility  Bed Mobility Overal bed mobility: Needs Assistance Bed Mobility: Supine to Sit, Sit to Supine     Supine to sit: Supervision, HOB elevated Sit to supine: Supervision, HOB elevated   General bed mobility comments: increased time, bed features. Patient Response: Cooperative  Transfers Overall transfer level: Needs assistance Equipment used: None Transfers: Sit to/from Stand Sit to Stand: Min guard           General transfer comment: Able to stand at bedside without AD.    Ambulation/Gait Ambulation/Gait assistance: Min guard Gait Distance (Feet): 40 Feet   Gait Pattern/deviations: Step-through pattern, Decreased step length - right, Decreased step length - left       General Gait Details: MIldly impulsive with gait moving quickly with author needing VC's to slow down for lines/lead management on IV pole. Safe use of RW  Stairs  Wheelchair Mobility    Modified Rankin (Stroke Patients Only)       Balance Overall balance assessment: Needs assistance Sitting-balance support:  Bilateral upper extremity supported, Feet supported Sitting balance-[name]: Good     Standing balance support: No upper extremity supported, During functional activity Standing balance-[name]: Fair Standing balance comment: can stand statically without AD                             Pertinent Vitals/Pain Pain Assessment Pain Assessment: No/denies pain    Home Living Family/patient expects to be discharged to:: Private residence Living Arrangements: Spouse/significant other Available Help at Discharge: Family;Available 24 hours/day Type of Home: Mobile home Home Access: Stairs to enter Entrance Stairs-Rails: Right Entrance Stairs-Number of Steps: 1   Home Layout: One level Home Equipment: Tub bench;Cane - quad [phone])      Prior Function Prior Level of Function : Independent/Modified Independent             Mobility Comments: No AD at baseline ADLs Comments: does not drive, wife assists.     Hand Dominance        Extremity/Trunk Assessment   Upper Extremity Assessment Upper Extremity Assessment: Overall WFL for tasks assessed    Lower Extremity Assessment Lower Extremity Assessment: Generalized weakness       Communication   Communication: No difficulties  Cognition Arousal/Alertness: Awake/alert Behavior During Therapy: WFL for tasks assessed/performed Overall Cognitive Status: Within Functional Limits for tasks assessed                                 General Comments: Mildly impulsive with mobility requiring VC's to slow and redirect with good carryover.        General Comments General comments (skin integrity, edema, etc.): SPO2 > 95% during mobility on 2L/min. RN notified.    Exercises Other Exercises Other Exercises: Role of PT in acute setting, d/c recs, benefits of HH PT versus OP PT.   Assessment/Plan    PT Assessment Patient needs continued PT services  PT Problem List Decreased strength;Decreased  knowledge of use of DME;Decreased activity tolerance;Decreased balance;Decreased mobility       PT Treatment Interventions DME instruction;Balance training;Gait training;Neuromuscular re-education;Stair training;Functional mobility training;Patient/family education;Therapeutic activities;Therapeutic exercise    PT Goals (Current goals can be found in the Care Plan section)  Acute Rehab PT Goals Patient Stated Goal: go back home, improve balance. PT Goal Formulation: With patient/family Time For Goal Achievement: 09/09/22 Potential to Achieve Goals: Good    Frequency Min 2X/week     Co-evaluation               AM-PAC PT "6 Clicks" Mobility  Outcome Measure Help needed turning from your back to your side while in a flat bed without using bedrails?: A Little Help needed moving from lying on your back to sitting on the side of a flat bed without using bedrails?: A Little Help needed moving to and from a bed to a chair (including a wheelchair)?: A Little Help needed standing up from a chair using your arms (e.g., wheelchair or bedside chair)?: A Little Help needed to walk in hospital room?: A Little Help needed climbing 3-5 steps with a railing? : A Lot 6 Click Score: 17    End of Session Equipment Utilized During Treatment: Gait belt Activity Tolerance: Patient tolerated treatment well Patient left: in  bed;with call bell/phone within reach;with bed alarm set;with family/visitor present Nurse Communication: Mobility status PT Visit Diagnosis: Muscle weakness (generalized) (M62.81);History of falling (Z91.81);Difficulty in walking, not elsewhere classified (R26.2)    Time: 9324-1991 PT Time Calculation (min) (ACUTE ONLY): 18 min   Charges:   PT Evaluation $PT Eval Low Complexity: Bradley Bowers IV, PT, DPT Physical Therapist- [facility]  08/26/2022, 2:59 PM

## 2022-08-26 NOTE — Progress Notes (Signed)
Progress Note   Patient: Bradley Bowers DQQ:[id number] DOB: [birth year] DOA: 08/25/2022     1 DOS: the patient was seen and examined on 08/26/2022   Brief hospital course: [name] is a 76 year old male with history of CVA, hypertension, Parkinson's, hyperlipidemia, who presented to the ED on 08/25/2022 for evaluation of lower abdominal pain x 3-4 days, decreased urine output, and difficulty walking.  No fever chills or dysuria, but some degree of urinary frequency.    Evaluation in the ED included UA that was positive for moderate leukocytes, consistent with UTI.    CT abdomen pelvis without contrast showed no acute abdominal findings to explain his symptoms.  It did show a suspect complex cyst of the right kidney.    Patient was admitted to medicine service and started on empiric IV Rocephin pending urine cultures.   Assessment and Plan: * UTI (urinary tract infection) POA.  Continue Rocephin empirically. Follow urine culture and de-escalate antibiotics accordingly.  Sepsis ruled out - pt had leukocytosis but no other SIRS criteria.  TIA (transient ischemic attack) Continue ASA, Lipitor  Bipolar 1 disorder (HCC) Continue Depakote   Chronic obstructive pulmonary disease (COPD) (HCC) Stable, no wheezing to suggest acute exacerbation.  Follows with Dr. Lanney Bowers. Reports chronic 2 L/min O2 use at baseline. - Resumed home maintenance inhaler equivalent with Dulera 2 puffs inhalation twice daily, DuoNebs every 6 hours --Monitor --Supplement O2 to keep sats 88-94%  AKI (acute kidney injury) ([place]) Cr on admission was 2.38 >> improved to 1.65. No known hx of CKD.  Baseline Cr six months ago was 1.1-1.3. --Continue IV fluids --Monitor BMP --Avoid nephrotoxins and renally dose meds  Parkinsonian features - Continue home donepezil, Depakote, carbamazepine  -- Follow up with Neurology  Peripheral neuropathy Appears not on gabapentin or Lyrica. Carbamazepine  continued.  Essential hypertension -- Continue spironolactone, diltiazem  - Hold HCTZ with AKI  Hyperlipemia Continue Lipitor   Coronary artery disease Stable, no active CP. Continue ASA.  Appears no longer on Plavix per med history.  Gastro-esophageal reflux disease without esophagitis Continue PPI        Subjective: Pt awake resting in bed when seen today.  A friend was visiting and excused himself for encounter.  Pt reported feeling better.  Has not been up to attempt walking yet, but admits to having difficulty ambulating since getting sick, agreeable to work with PT.  No fever/chills, improved abdominal pain.  No N/V.   Physical Exam: Vitals:   08/26/22 0026 08/26/22 0758 08/26/22 1113 08/26/22 1649  BP: 132/66 (!) 145/69 (!) 157/74 (!) 141/59  Pulse: 64 86 88 65  Resp: '18 20 20 18  '$ Temp: 98.4 F (36.9 C) (!) 97.4 F (36.3 C) 97.8 F (36.6 C) 98.1 F (36.7 C)  TempSrc: Oral Oral    SpO2: 93% 95% 96% 95%  Weight:      Height:       General exam: awake, alert, no acute distress HEENT: atraumatic, clear conjunctiva, anicteric sclera, moist mucus membranes, hearing grossly normal  Respiratory system: CTAB but diminished throughout, no wheezes, normal respiratory effort at rest on 2 L/min o2. Cardiovascular system: normal S1/S2, RRR, no JVD, murmurs, rubs, gallops, no pedal edema.   Gastrointestinal system: soft, NT, ND, no HSM felt, +bowel sounds. Central nervous system: A&O x4. no gross focal neurologic deficits, normal speech Extremities: moves all, tremor of outstretched hands noted, no edema, normal tone Skin: dry, intact, normal temperature Psychiatry: normal mood, congruent affect, judgement and  insight appear normal   Data Reviewed:  Notable labs --- Cr improved 1.65 from 2.38, BUN 62, WBC improved 12.1, lactic acid normalized 1.5  Family Communication: None at bedside during encounter.  Disposition: Status is: Inpatient Remains inpatient appropriate  because: Remains on IV antibiotics pending urine culture results    Planned Discharge Destination: Home    Time spent: 40 minutes  Author: Ezekiel Slocumb, DO 08/26/2022 5:41 PM  For on call review [email or web address].

## 2022-08-26 NOTE — Assessment & Plan Note (Signed)
Appears not on gabapentin or Lyrica. Carbamazepine continued.

## 2022-08-27 LAB — CBC
HCT: 42.1 % (ref 39.0–52.0)
Hemoglobin: 14.4 g/dL (ref 13.0–17.0)
MCH: 30.8 pg (ref 26.0–34.0)
MCHC: 34.2 g/dL (ref 30.0–36.0)
MCV: 90 fL (ref 80.0–100.0)
Platelets: 152 10*3/uL (ref 150–400)
RBC: 4.68 MIL/uL (ref 4.22–5.81)
RDW: 11.5 % (ref 11.5–15.5)
WBC: 8.6 10*3/uL (ref 4.0–10.5)
nRBC: 0 % (ref 0.0–0.2)

## 2022-08-27 LAB — BASIC METABOLIC PANEL
Anion gap: 7 (ref 5–15)
BUN: 39 mg/dL — ABNORMAL HIGH (ref 8–23)
CO2: 28 mmol/L (ref 22–32)
Calcium: 9.1 mg/dL (ref 8.9–10.3)
Chloride: 103 mmol/L (ref 98–111)
Creatinine, Ser: 1.18 mg/dL (ref 0.61–1.24)
GFR, Estimated: >60 mL/min (ref >60)
Glucose, Bld: 115 mg/dL — ABNORMAL HIGH (ref 70–99)
Potassium: 4.3 mmol/L (ref 3.5–5.1)
Sodium: 138 mmol/L (ref 135–145)

## 2022-08-27 MED ORDER — AMOXICILLIN-POT CLAVULANATE 875-125 MG PO TABS: 1.0000 | ORAL_TABLET | Freq: Two times a day (BID) | ORAL | 0 refills | Status: AC

## 2022-08-27 MED ORDER — AMOXICILLIN-POT CLAVULANATE 875-125 MG PO TABS
1.0000 | ORAL_TABLET | Freq: Two times a day (BID) | ORAL | Status: DC
  Administered 2022-08-27: 1 via ORAL
  Filled 2022-08-27: qty 1

## 2022-08-27 NOTE — Progress Notes (Signed)
Physical Therapy Treatment Patient Details Name: Bradley Bowers MRN: [id number] DOB: 1945-12-29 Today's Date: 08/27/2022   History of Present Illness [name] is a 76 year old male with history of CVA, hypertension, Parkinson's, hyperlipidemia, who presents emergency department for chief concerns of altered mental status, difficulty walking.    PT Comments    Pt received upright in recliner. Agreeable to PT services. Pt less impulsive this date following all commands, is not quick to mobilize until PT is ready. Pt initially on 2L/min with SPO2 in upper 90's titrated to RA at rest maintaining 94-96%. Stands with supervision without AD and attempts 10'. Notable step to gait pattern needing minguard for safety with UE's in high guard position. Pt provided RW ambulating at supervision level and step through gait additional 90'. Pt declining further distances as pt endorsing LE weakness and fatigue. Pt returning to recliner with SPO2 at 86%. Education on PLB with good carryover with SPO2 returning to 93-94% after 2-3 minutes seated rest. Pt educated on use of 3WW at home for energy conservation, reduced falls risk. Continuing to educate on OPPT referral after [facility] PT as pt has goals to improve dynamic gait/balance to reduce falls risk. Pt understanding of processes. Left on RA with attending MD aware and all needs in reach. Will continue d/c recs with HHPT at d/c to ensure safe transition home.    Recommendations for follow up therapy are one component of a multi-disciplinary discharge planning process, led by the attending physician.  Recommendations may be updated based on patient status, additional functional criteria and insurance authorization.  Follow Up Recommendations  Home health PT     Assistance Recommended at Discharge Frequent or constant Supervision/Assistance  Patient can return home with the following A little help with walking and/or transfers;Assistance with  cooking/housework;Assist for transportation;Help with stairs or ramp for entrance;A little help with bathing/dressing/bathroom   Equipment Recommendations  None recommended by PT    Recommendations for Other Services       Precautions / Restrictions Precautions Precautions: Fall Restrictions Weight Bearing Restrictions: No     Mobility  Bed Mobility               General bed mobility comments: NT. In recliner pre and post session Patient Response: Cooperative  Transfers Overall transfer level: Needs assistance Equipment used: None Transfers: Sit to/from Stand Sit to Stand: Supervision           General transfer comment: use of hands on arm rests    Ambulation/Gait Ambulation/Gait assistance: Min guard, Supervision Gait Distance (Feet): 100 Feet (90 with RW, 10 without AD)   Gait Pattern/deviations: Step-through pattern, Decreased step length - right, Decreased step length - left, Step-to pattern       General Gait Details: Trialed gait withot AD today. Pt performing 10' with UE's in highguard,  very limited gait distance with step to pattern, clearly uncomfortable without AD. With RW pt ambulating step through with consistent gait cadence.   Stairs             Wheelchair Mobility    Modified Rankin (Stroke Patients Only)       Balance Overall balance assessment: Needs assistance Sitting-balance support: Bilateral upper extremity supported, Feet supported Sitting balance-[name]: Good     Standing balance support: No upper extremity supported, During functional activity Standing balance-[name]: Fair Standing balance comment: can stand statically without AD  Cognition Arousal/Alertness: Awake/alert Behavior During Therapy: WFL for tasks assessed/performed Overall Cognitive Status: Within Functional Limits for tasks assessed                                 General Comments: No  impulsivity this date.        Exercises General Exercises - Lower Extremity Long Arc Quad: AROM, Strengthening, Both, 10 reps, Seated Hip Flexion/Marching: AROM, Seated, Strengthening, Both, 5 reps Other Exercises Other Exercises: benefits of AD for energy conservation and reduced falls risk. Benefits of OPPT nafter HH PT for gait/balance training.    General Comments General comments (skin integrity, edema, etc.): SPO2 on RA to 86% post gait. WIth seated rest and PLB returns to 93-94%. Left on RA with attending MD aware.      Pertinent Vitals/Pain Pain Assessment Pain Assessment: No/denies pain    Home Living                          Prior Function            PT Goals (current goals can now be found in the care plan section) Acute Rehab PT Goals Patient Stated Goal: go back home, improve balance. PT Goal Formulation: With patient/family Time For Goal Achievement: 09/09/22 Potential to Achieve Goals: Good Progress towards PT goals: Progressing toward goals    Frequency    Min 2X/week      PT Plan Current plan remains appropriate    Co-evaluation              AM-PAC PT "6 Clicks" Mobility   Outcome Measure  Help needed turning from your back to your side while in a flat bed without using bedrails?: A Little Help needed moving from lying on your back to sitting on the side of a flat bed without using bedrails?: A Little Help needed moving to and from a bed to a chair (including a wheelchair)?: A Little Help needed standing up from a chair using your arms (e.g., wheelchair or bedside chair)?: A Little Help needed to walk in hospital room?: A Little Help needed climbing 3-5 steps with a railing? : A Lot 6 Click Score: 17    End of Session Equipment Utilized During Treatment: Gait belt Activity Tolerance: Patient tolerated treatment well Patient left: in chair;with call bell/phone within reach;with chair alarm set Nurse Communication: Mobility  status PT Visit Diagnosis: Muscle weakness (generalized) (M62.81);History of falling (Z91.81);Difficulty in walking, not elsewhere classified (R26.2)     Time: 7048-8891 PT Time Calculation (min) (ACUTE ONLY): 19 min  Charges:  $Therapeutic Exercise: 8-22 mins                    Bradley Bowers. Fairly IV, PT, DPT Physical Therapist- [facility]  08/27/2022, 11:29 AM

## 2022-08-27 NOTE — Plan of Care (Signed)

## 2022-08-27 NOTE — TOC Initial Note (Addendum)
Transition of Care [facility]) - Initial/Assessment Note    Patient Details  Name: Bradley Bowers MRN: [id number] Date of Birth: 1946-03-07  Transition of Care [facility] CM/SW Contact:    [name], [place] Phone Number: 08/27/2022, 2:59 PM  Clinical Narrative:                  This patient will need HHPT. Patient has [employer]. Patient sees Dr. Sabra Heck at [facility]. The [facility] [place]. He gets rides to appointments from friends and family.   TOC reached out to home health agencies.  Expected Discharge Plan: [place]     Patient Goals and CMS Choice        Expected Discharge Plan and Services Expected Discharge Plan: [place]         Expected Discharge Date: 08/27/22                                    Prior Living Arrangements/Services                       Activities of Daily Living Home Assistive Devices/Equipment: Kasandra Knudsen (specify quad or straight), Walker (specify type) ADL Screening (condition at time of admission) Patient's cognitive ability adequate to safely complete daily activities?: Yes Is the patient deaf or have difficulty hearing?: No Does the patient have difficulty seeing, even when wearing glasses/contacts?: No Does the patient have difficulty concentrating, remembering, or making decisions?: No Patient able to express need for assistance with ADLs?: Yes Does the patient have difficulty dressing or bathing?: No Independently performs ADLs?: Yes (appropriate for developmental age) Does the patient have difficulty walking or climbing stairs?: Yes Weakness of Legs: None Weakness of Arms/Hands: None  Permission Sought/Granted                  Emotional Assessment Appearance:: Appears stated age            Admission diagnosis:  Dehydration [E86.0] Lower urinary tract infectious disease [N39.0] AKI (acute kidney injury) ([place]) [N17.9] Sepsis secondary to UTI ([place])  [A41.9, N39.0] Patient Active Problem List   Diagnosis Date Noted   Urinary tract infection in male 08/25/2022   AKI (acute kidney injury) ([place]) 08/25/2022   Parkinsonian features    CVA (cerebral vascular accident) ([place]) 01/31/2022   Weakness    Essential hypertension 01/30/2022   Peripheral neuropathy 01/30/2022   TIA (transient ischemic attack) 01/29/2022   Acute respiratory failure with hypoxia ([place]) 05/22/2021   COPD with acute exacerbation ([place]) 05/22/2021   Hyperlipemia 05/22/2021   Bipolar 1 disorder ([place]) 05/22/2021   Renal insufficiency 05/22/2021   Leg edema 05/22/2021   Coronary artery disease 09/24/2016   Glaucoma 09/24/2016   Bipolar affective disorder, current episode manic with psychotic symptoms ([place]) 09/23/2016   Chronic obstructive pulmonary disease (COPD) ([place]) 11/06/2015   Gastro-esophageal reflux disease without esophagitis 10/24/2014   Obstructive apnea 10/24/2014   Restless leg 10/24/2014   PCP:  Rusty Aus, MD Pharmacy:   [id number] [place] [facility] [id number]: [phone]: [phone] [id number] [id number]: [phone]: [phone]   Social Determinants of Health (SDOH) Interventions    Readmission  Risk Interventions     No data to display

## 2022-08-27 NOTE — Progress Notes (Signed)
Patient being discharged home. PIV removed. Went over discharge instructions and medications with patient. Patient stated that he understood. Patient going home POV with wife.

## 2022-08-27 NOTE — Discharge Summary (Addendum)
Physician Discharge Summary   Patient: Bradley Bowers MRN: [id number] DOB: 1946-03-12  Admit date:     08/25/2022  Discharge date: 09/04/22  Discharge Physician: Ezekiel Slocumb   PCP: Rusty Aus, MD   Recommendations at discharge:    Follow up with Primary Care in 1-2 weeks Follow up with Pulmonology as scheduled Follow up with Neurology as scheduled Repeat BMP, CBC, Mg in 1-2 weeks Follow up on resolution of UTI after completion of antibiotics Inhaler regimen on patient's medication history list had duplicated therapies (Advair, Dulera and Breo).  Based on last pulmonology office visit note, I will continue Advair and discharge and remove the others to reduce confusion in the future.  Please re-address inhaler regimen at follow up.   Discharge Diagnoses: Principal Problem:   Urinary tract infection in male Active Problems:   TIA (transient ischemic attack)   Bipolar 1 disorder (HCC)   Chronic obstructive pulmonary disease (COPD) (HCC)   Gastro-esophageal reflux disease without esophagitis   Restless leg   Coronary artery disease   Hyperlipemia   Essential hypertension   Peripheral neuropathy   Parkinsonian features   AKI (acute kidney injury) ([place])   Acute metabolic encephalopathy   Hospital Course: Bradley Bowers is a 76 year old male with history of CVA, hypertension, Parkinson's, hyperlipidemia, who presented to the ED on 08/25/2022 for evaluation of lower abdominal pain x 3-4 days, decreased urine output, and difficulty walking.  No fever chills or dysuria, but some degree of urinary frequency.    Evaluation in the ED included UA that was positive for moderate leukocytes, consistent with UTI.    CT abdomen pelvis without contrast showed no acute abdominal findings to explain his symptoms.  It did show a suspect complex cyst of the right kidney.    Patient was admitted to medicine service and started on empiric IV Rocephin pending urine cultures.    Assessment and Plan: * Urinary tract infection in male POA.  Continue Rocephin empirically. Urine culture grew enterococcus faecalsi. Discharge on Augmentin to complete course.  Sepsis ruled out - pt had leukocytosis but no other SIRS criteria.  TIA (transient ischemic attack) Continue ASA, Lipitor  Bipolar 1 disorder (HCC) Continue Depakote   Chronic obstructive pulmonary disease (COPD) (HCC) Stable, no wheezing to suggest acute exacerbation.  Follows with Dr. Lanney Gins. Reports chronic 2 L/min O2 use at baseline. - Resumed home regimen --Monitor --Supplement O2 to keep sats 88-94% --Pulmonology follow up  Acute metabolic encephalopathy POA, secondary to UTI.  Resolved.  AKI (acute kidney injury) ([place]) Cr on admission was 2.38 >> improved to 1.65. No known hx of CKD.  Baseline Cr six months ago was 1.1-1.3. --Cr normalized with IV fluids --Monitor BMP at follow up  Parkinsonian features - Continue home donepezil, Depakote, carbamazepine  -- Follow up with Neurology  Peripheral neuropathy Appears not on gabapentin or Lyrica. Carbamazepine continued.  Essential hypertension -- Continue spironolactone, diltiazem  - Hold HCTZ with AKI, resume at d/c  Hyperlipemia Continue Lipitor   Coronary artery disease Stable, no active CP. Continue ASA.  Appears no longer on Plavix per med history.  Restless leg .  Gastro-esophageal reflux disease without esophagitis Continue PPI         Consultants: None Procedures performed: None  Disposition: Home health Diet recommendation:  Cardiac diet DISCHARGE MEDICATION: Allergies as of 08/27/2022   No Known Allergies      Medication List     STOP taking these medications    atenolol  25 MG tablet Commonly known as: TENORMIN   atenolol 50 MG tablet Commonly known as: TENORMIN   clopidogrel 75 MG tablet Commonly known as: PLAVIX   fluticasone furoate-vilanterol 100-25 MCG/INH Aepb Commonly known as:  BREO ELLIPTA   furosemide 20 MG tablet Commonly known as: LASIX   OLANZapine 5 MG tablet Commonly known as: ZYPREXA   Symbicort 80-4.5 MCG/ACT inhaler Generic drug: budesonide-formoterol       TAKE these medications    Advair Diskus 100-50 MCG/ACT Aepb Generic drug: fluticasone-salmeterol Inhale 1 puff into the lungs 2 (two) times daily.   albuterol 108 (90 Base) MCG/ACT inhaler Commonly known as: VENTOLIN HFA Inhale 1-2 puffs into the lungs every 4 (four) hours as needed.   aspirin EC 81 MG tablet Take 81 mg by mouth daily.   atorvastatin 80 MG tablet Commonly known as: LIPITOR Take 1 tablet (80 mg total) by mouth daily.   carbamazepine 200 MG tablet Commonly known as: TEGRETOL Take 1 tablet by mouth 2 (two) times daily.   cyanocobalamin 1000 MCG tablet Take 1 tablet (1,000 mcg total) by mouth daily.   diltiazem 180 MG 24 hr capsule Commonly known as: CARDIZEM CD Take 180 mg by mouth daily.   divalproex 250 MG 24 hr tablet Commonly known as: DEPAKOTE ER Take 500 mg by mouth daily. What changed: Another medication with the same name was removed. Continue taking this medication, and follow the directions you see here.   donepezil 10 MG tablet Commonly known as: ARICEPT Take 1 tablet by mouth at bedtime. What changed: Another medication with the same name was removed. Continue taking this medication, and follow the directions you see here.   fluticasone 50 MCG/ACT nasal spray Commonly known as: FLONASE Place 1 spray into both nostrils daily.   hydrochlorothiazide 25 MG tablet Commonly known as: HYDRODIURIL Take 25 mg by mouth daily.   lisinopril 2.5 MG tablet Commonly known as: ZESTRIL Take 1 tablet (2.5 mg total) by mouth daily.   montelukast 10 MG tablet Commonly known as: SINGULAIR Take 10 mg by mouth at bedtime.   pantoprazole 40 MG tablet Commonly known as: PROTONIX Take 40 mg by mouth 2 (two) times daily.   spironolactone 25 MG  tablet Commonly known as: ALDACTONE Take 1 tablet by mouth daily.   timolol 0.5 % ophthalmic solution Commonly known as: TIMOPTIC 1 drop 2 (two) times daily.   tiotropium 18 MCG inhalation capsule Commonly known as: SPIRIVA Place 18 mcg into inhaler and inhale daily.   torsemide 20 MG tablet Commonly known as: DEMADEX Take 20 mg by mouth daily.       ASK your doctor about these medications    amoxicillin-clavulanate 875-125 MG tablet Commonly known as: AUGMENTIN Take 1 tablet by mouth every 12 (twelve) hours for 5 days. Ask about: Should I take this medication?        Discharge Exam: Filed Weights   08/25/22 1013  Weight: 91.6 kg   General exam: awake, alert, no acute distress HEENT: atraumatic, clear conjunctiva, anicteric sclera, moist mucus membranes, hearing grossly normal  Respiratory system: CTAB, no wheezes, rales or rhonchi, normal respiratory effort. Cardiovascular system: normal S1/S2, RRR, no JVD, murmurs, rubs, gallops, no pedal edema.   Gastrointestinal system: soft, NT, ND, no HSM felt, +bowel sounds. Central nervous system: A&O x4. no gross focal neurologic deficits, normal speech Extremities: moves all, no edema, normal tone Skin: dry, intact, normal temperature, normal color, No rashes, lesions or ulcers Psychiatry: normal mood, congruent affect,  judgement and insight appear normal   Condition at discharge: stable  The results of significant diagnostics from this hospitalization (including imaging, microbiology, ancillary and laboratory) are listed below for reference.   Imaging Studies: CT HEAD WO CONTRAST (5MM)  Result Date: 08/25/2022 CLINICAL DATA:  Neuro deficit, acute, stroke suspected Mental status change, unknown cause EXAM: CT HEAD WITHOUT CONTRAST TECHNIQUE: Contiguous axial images were obtained from the base of the skull through the vertex without intravenous contrast. RADIATION DOSE REDUCTION: This exam was performed according to the  departmental dose-optimization program which includes automated exposure control, adjustment of the mA and/or kV according to patient size and/or use of iterative reconstruction technique. COMPARISON:  January 29, 2022 FINDINGS: Brain: No evidence of acute infarction, hemorrhage, hydrocephalus, extra-axial collection or mass lesion/mass effect. Periventricular white matter hypodensities consistent with sequela of chronic microvascular ischemic disease. Global parenchymal volume loss, mildly age advanced. Vascular: Vascular calcifications. Skull: Normal. Negative for fracture or focal lesion. Sinuses/Orbits: No acute finding. Other: None. IMPRESSION: No acute intracranial abnormality. Electronically Signed   By: Valentino Saxon M.D.   On: 08/25/2022 18:35   CT ABDOMEN PELVIS WO CONTRAST  Result Date: 08/25/2022 CLINICAL DATA:  Abdominal pain, acute, nonlocalized EXAM: CT ABDOMEN AND PELVIS WITHOUT CONTRAST TECHNIQUE: Multidetector CT imaging of the abdomen and pelvis was performed following the standard protocol without IV contrast. RADIATION DOSE REDUCTION: This exam was performed according to the departmental dose-optimization program which includes automated exposure control, adjustment of the mA and/or kV according to patient size and/or use of iterative reconstruction technique. COMPARISON:  June 22, 2021 FINDINGS: Evaluation is limited by lack of IV contrast. Lower chest: Evaluation is limited by motion. There is a 7 mm x 4 mm LEFT lower lobe pulmonary nodule, new since prior (series 4, image 7). There is some faint additional RIGHT lower lobe 3 mm pulmonary nodules, incompletely assessed due to motion. Hepatobiliary: Cholelithiasis within the gallbladder neck. No evidence of acute cholecystitis. Unremarkable noncontrast appearance of the liver. Pancreas: No peripancreatic fat stranding. Spleen: Unremarkable. Adrenals/Urinary Tract: Adrenal glands are unremarkable. No hydronephrosis. No obstructing  nephrolithiasis. There is a low-density mass of the inferior pole the RIGHT kidney measuring 6 cm, previously characterized sonographically as a benign cyst (for which no dedicated imaging follow-up is recommended) . There is a mildly complicated appearing cluster of hypodense masses of the superior pole of the RIGHT kidney which span approximately 12 mm by 16 mm (series 2, image 22; series 6, image 44). Bladder is decompressed. Stomach/Bowel: No evidence of bowel obstruction. Sigmoid diverticulosis without evidence of acute diverticulitis. There is a prominent appearance of the mucosa of the sigmoid colon likely reflecting a degree of chronic diverticulitis. Appendix is normal. Stomach is decompressed. Vascular/Lymphatic: Atherosclerotic calcifications of the nonaneurysmal abdominal aorta. No suspicious lymphadenopathy. Retroaortic LEFT renal vein. Reproductive: Prostate is present. Other: No free air or free fluid. Musculoskeletal: Moderate degenerative changes of the lower lumbar spine. IMPRESSION: 1. No CT etiology for acute abdominal pain is identified. 2. There is a mildly complicated appearing cluster of hypodense masses of the superior pole of the RIGHT kidney which span approximately 12 mm by 16 mm. This likely reflects mildly complicated cyst. Consider follow-up renal protocol MRI with and without contrast for definitive characterization versus follow-up nonemergent renal ultrasound. 3. There is a 7 mm x 4 mm LEFT lower lobe pulmonary nodule, new since prior. Per Fleischner Society Guidelines, recommend a non-contrast Chest CT at 3-6 months, then consider another non-contrast Chest CT at 18-24  months. If patient is low risk for malignancy, non-contrast Chest CT at 18-24 months is optional. These guidelines do not apply to immunocompromised patients and patients with cancer. Follow up in patients with significant comorbidities as clinically warranted. For lung cancer screening, adhere to Lung-RADS  guidelines. Reference: Radiology. 2017; 284(1):228-43. 4. Cholelithiasis without evidence of acute cholecystitis. 5. Sigmoid diverticulosis without evidence of acute diverticulitis. There is a prominent appearance of the mucosa of the sigmoid colon likely reflecting a degree of chronic diverticulitis. Recommend correlation with colonoscopy screening history. Aortic Atherosclerosis (ICD10-I70.0). Electronically Signed   By: Valentino Saxon M.D.   On: 08/25/2022 13:50   DG Chest Port 1 View  Result Date: 08/25/2022 CLINICAL DATA:  76 year old male with history of cough and weakness. EXAM: PORTABLE CHEST 1 VIEW COMPARISON:  Chest x-ray 05/22/2021. FINDINGS: Lung volumes are normal. No consolidative airspace disease. No pleural effusions. No pneumothorax. No pulmonary nodule or mass noted. Pulmonary vasculature and the cardiomediastinal silhouette are within normal limits. IMPRESSION: No radiographic evidence of acute cardiopulmonary disease. Electronically Signed   By: Vinnie Langton M.D.   On: 08/25/2022 11:35    Microbiology: Results for orders placed or performed during the hospital encounter of 08/25/22  Resp Panel by RT-PCR (Flu A&B, Covid) Anterior Nasal Swab     Status: None   Collection Time: 08/25/22 10:19 AM   Specimen: Anterior Nasal Swab  Result Value Ref Range Status   SARS Coronavirus 2 by RT PCR NEGATIVE NEGATIVE Final    Comment: (NOTE) SARS-CoV-2 target nucleic acids are NOT DETECTED.  The SARS-CoV-2 RNA is generally detectable in upper respiratory specimens during the acute phase of infection. The lowest concentration of SARS-CoV-2 viral copies this assay can detect is 138 copies/mL. A negative result does not preclude SARS-Cov-2 infection and should not be used as the sole basis for treatment or other patient management decisions. A negative result may occur with  improper specimen collection/handling, submission of specimen other than nasopharyngeal swab, presence of  viral mutation(s) within the areas targeted by this assay, and inadequate number of viral copies(<138 copies/mL). A negative result must be combined with clinical observations, patient history, and epidemiological information. The expected result is Negative.  Fact Sheet for Patients:  EntrepreneurPulse.com.au  Fact Sheet for Healthcare Providers:  IncredibleEmployment.be  This test is no t yet approved or cleared by the [place] FDA and  has been authorized for detection and/or diagnosis of SARS-CoV-2 by FDA under an Emergency Use Authorization (EUA). This EUA will remain  in effect (meaning this test can be used) for the duration of the COVID-19 declaration under Section 564(b)(1) of the Act, 21 U.S.C.section 360bbb-3(b)(1), unless the authorization is terminated  or revoked sooner.       Influenza A by PCR NEGATIVE NEGATIVE Final   Influenza B by PCR NEGATIVE NEGATIVE Final    Comment: (NOTE) The Xpert Xpress SARS-CoV-2/FLU/RSV plus assay is intended as an aid in the diagnosis of influenza from Nasopharyngeal swab specimens and should not be used as a sole basis for treatment. Nasal washings and aspirates are unacceptable for Xpert Xpress SARS-CoV-2/FLU/RSV testing.  Fact Sheet for Patients: EntrepreneurPulse.com.au  Fact Sheet for Healthcare Providers: IncredibleEmployment.be  This test is not yet approved or cleared by the [place] FDA and has been authorized for detection and/or diagnosis of SARS-CoV-2 by FDA under an Emergency Use Authorization (EUA). This EUA will remain in effect (meaning this test can be used) for the duration of the COVID-19 declaration under Section 564(b)(1)  of the Act, 21 U.S.C. section 360bbb-3(b)(1), unless the authorization is terminated or revoked.  Performed at [facility], [address] [place], [place] [address]   Urine Culture     Status:  Abnormal   Collection Time: 08/25/22  1:06 PM   Specimen: Urine, Clean Catch  Result Value Ref Range Status   Specimen Description   Final    URINE, CLEAN CATCH Performed at [facility], [address] [place], [place] [address]    Special Requests   Final    NONE Performed at [facility], [place], [place] [address]    Culture 10,000 COLONIES/mL ENTEROCOCCUS FAECALIS (A)  Final   Report Status 08/28/2022 FINAL  Final   Organism ID, Bacteria ENTEROCOCCUS FAECALIS (A)  Final      Susceptibility   Enterococcus faecalis - MIC*    AMPICILLIN <=2 SENSITIVE Sensitive     NITROFURANTOIN <=16 SENSITIVE Sensitive     VANCOMYCIN 1 SENSITIVE Sensitive     * 10,000 COLONIES/mL ENTEROCOCCUS FAECALIS  Culture, blood (Routine X 2) w Reflex to ID Panel     Status: None   Collection Time: 08/25/22  8:38 PM   Specimen: BLOOD  Result Value Ref Range Status   Specimen Description BLOOD BLOOD RIGHT ARM  Final   Special Requests   Final    BOTTLES DRAWN AEROBIC AND ANAEROBIC Blood Culture adequate volume   Culture   Final    NO GROWTH 5 DAYS Performed at [facility], [address] [place], [place] [address]    Report Status 08/30/2022 FINAL  Final  Culture, blood (Routine X 2) w Reflex to ID Panel     Status: None   Collection Time: 08/25/22  8:48 PM   Specimen: BLOOD  Result Value Ref Range Status   Specimen Description BLOOD BLOOD RIGHT HAND  Final   Special Requests   Final    BOTTLES DRAWN AEROBIC AND ANAEROBIC Blood Culture adequate volume   Culture   Final    NO GROWTH 5 DAYS Performed at [facility], [address], [place], [place] [address]    Report Status 08/30/2022 FINAL  Final    Labs: CBC: No results for input(s): "WBC", "NEUTROABS", "HGB", "HCT", "MCV", "PLT" in the last 168 hours.  Basic Metabolic Panel: No results for input(s): "NA", "K", "CL", "CO2", "GLUCOSE", "BUN", "CREATININE", "CALCIUM", "MG", "PHOS" in  the last 168 hours.  Liver Function Tests: No results for input(s): "AST", "ALT", "ALKPHOS", "BILITOT", "PROT", "ALBUMIN" in the last 168 hours.  CBG: No results for input(s): "GLUCAP" in the last 168 hours.  Discharge time spent: less than 30 minutes.  Signed: Ezekiel Slocumb, DO [facility] 09/04/2022

## 2022-08-27 NOTE — TOC Transition Note (Signed)
Transition of Care [facility]) - CM/SW Discharge Note   Patient Details  Name: Bradley Bowers MRN: [id number] Date of Birth: [birth year]  [facility] CM/SW Contact:  [name], [place] Phone Number: 08/27/2022, 3:15 PM   Clinical Narrative:     Donella Stade contacted Tommi Rumps at [facility] regarding [place]. Patient has been seen by [facility] in the past.  Final next level of care: [place]     Patient Goals and CMS Choice        Discharge Placement             HHPT          Discharge Plan and Services                          HH Arranged: PT [facility] Agency: [place] Date [place]: 08/27/22 Time [place] Agency Contacted: [id number] Representative spoke with at [place]: Tommi Rumps at [place] ([place]) Interventions     Readmission Risk Interventions     No data to display

## 2022-08-28 LAB — URINE CULTURE: Culture: 10000 — AB

## 2022-08-30 LAB — CULTURE, BLOOD (ROUTINE X 2)
Culture: NO GROWTH
Culture: NO GROWTH
Special Requests: ADEQUATE
Special Requests: ADEQUATE

## 2022-09-03 DIAGNOSIS — A419 Sepsis, unspecified organism: Secondary | ICD-10-CM | POA: Diagnosis not present

## 2022-09-03 DIAGNOSIS — I2781 Cor pulmonale (chronic): Secondary | ICD-10-CM | POA: Diagnosis not present

## 2022-09-03 DIAGNOSIS — N281 Cyst of kidney, acquired: Secondary | ICD-10-CM | POA: Diagnosis not present

## 2022-09-03 DIAGNOSIS — R911 Solitary pulmonary nodule: Secondary | ICD-10-CM | POA: Diagnosis not present

## 2022-09-04 DIAGNOSIS — G9341 Metabolic encephalopathy: Secondary | ICD-10-CM | POA: Diagnosis present

## 2022-09-04 NOTE — Assessment & Plan Note (Signed)
POA, secondary to UTI.  Resolved.

## 2022-09-05 ENCOUNTER — Other Ambulatory Visit: Payer: Self-pay | Admitting: Internal Medicine

## 2022-09-05 DIAGNOSIS — R1900 Intra-abdominal and pelvic swelling, mass and lump, unspecified site: Secondary | ICD-10-CM

## 2022-09-05 DIAGNOSIS — A419 Sepsis, unspecified organism: Secondary | ICD-10-CM

## 2022-09-09 DIAGNOSIS — G9341 Metabolic encephalopathy: Secondary | ICD-10-CM | POA: Diagnosis not present

## 2022-09-09 DIAGNOSIS — G20A1 Parkinson's disease without dyskinesia, without mention of fluctuations: Secondary | ICD-10-CM | POA: Diagnosis not present

## 2022-09-09 DIAGNOSIS — G629 Polyneuropathy, unspecified: Secondary | ICD-10-CM | POA: Diagnosis not present

## 2022-09-09 DIAGNOSIS — N179 Acute kidney failure, unspecified: Secondary | ICD-10-CM | POA: Diagnosis not present

## 2022-09-09 DIAGNOSIS — N390 Urinary tract infection, site not specified: Secondary | ICD-10-CM | POA: Diagnosis not present

## 2022-09-09 DIAGNOSIS — J4489 Other specified chronic obstructive pulmonary disease: Secondary | ICD-10-CM | POA: Diagnosis not present

## 2022-09-09 DIAGNOSIS — J432 Centrilobular emphysema: Secondary | ICD-10-CM | POA: Diagnosis not present

## 2022-09-13 ENCOUNTER — Ambulatory Visit
Admission: RE | Admit: 2022-09-13 | Discharge: 2022-09-13 | Disposition: A | Discharge status: Home / Self Care | Payer: Medicare HMO | Source: Ambulatory Visit | Attending: Internal Medicine | Admitting: Internal Medicine

## 2022-09-13 DIAGNOSIS — R1900 Intra-abdominal and pelvic swelling, mass and lump, unspecified site: Secondary | ICD-10-CM | POA: Insufficient documentation

## 2022-09-13 DIAGNOSIS — K8020 Calculus of gallbladder without cholecystitis without obstruction: Secondary | ICD-10-CM | POA: Diagnosis not present

## 2022-09-13 DIAGNOSIS — N390 Urinary tract infection, site not specified: Secondary | ICD-10-CM | POA: Diagnosis not present

## 2022-09-13 DIAGNOSIS — D1800 Hemangioma unspecified site: Secondary | ICD-10-CM | POA: Diagnosis not present

## 2022-09-13 DIAGNOSIS — A419 Sepsis, unspecified organism: Secondary | ICD-10-CM

## 2022-09-13 DIAGNOSIS — N281 Cyst of kidney, acquired: Secondary | ICD-10-CM | POA: Diagnosis not present

## 2022-09-13 MED ORDER — GADOBUTROL 1 MMOL/ML IV SOLN
9.0000 mL | Freq: Once | INTRAVENOUS | Status: AC | PRN
  Administered 2022-09-13: 9 mL via INTRAVENOUS

## 2022-09-17 DIAGNOSIS — A419 Sepsis, unspecified organism: Secondary | ICD-10-CM | POA: Diagnosis not present

## 2022-09-17 DIAGNOSIS — N390 Urinary tract infection, site not specified: Secondary | ICD-10-CM | POA: Diagnosis not present

## 2022-09-23 ENCOUNTER — Emergency Department: Payer: Medicare HMO

## 2022-09-23 ENCOUNTER — Inpatient Hospital Stay
Admission: EM | Admit: 2022-09-23 | Discharge: 2022-10-08 | DRG: 178 | Disposition: A | Discharge status: Skilled Nursing Facility | Payer: Medicare HMO | Attending: Internal Medicine | Admitting: Internal Medicine

## 2022-09-23 ENCOUNTER — Other Ambulatory Visit: Payer: Self-pay

## 2022-09-23 DIAGNOSIS — M6281 Muscle weakness (generalized): Secondary | ICD-10-CM | POA: Diagnosis not present

## 2022-09-23 DIAGNOSIS — R296 Repeated falls: Secondary | ICD-10-CM | POA: Diagnosis present

## 2022-09-23 DIAGNOSIS — J9611 Chronic respiratory failure with hypoxia: Secondary | ICD-10-CM | POA: Diagnosis not present

## 2022-09-23 DIAGNOSIS — E785 Hyperlipidemia, unspecified: Secondary | ICD-10-CM | POA: Diagnosis not present

## 2022-09-23 DIAGNOSIS — Z9981 Dependence on supplemental oxygen: Secondary | ICD-10-CM

## 2022-09-23 DIAGNOSIS — K219 Gastro-esophageal reflux disease without esophagitis: Secondary | ICD-10-CM | POA: Diagnosis present

## 2022-09-23 DIAGNOSIS — F312 Bipolar disorder, current episode manic severe with psychotic features: Secondary | ICD-10-CM | POA: Diagnosis not present

## 2022-09-23 DIAGNOSIS — G20B2 Parkinson's disease with dyskinesia, with fluctuations: Secondary | ICD-10-CM | POA: Diagnosis not present

## 2022-09-23 DIAGNOSIS — G319 Degenerative disease of nervous system, unspecified: Secondary | ICD-10-CM | POA: Diagnosis not present

## 2022-09-23 DIAGNOSIS — Z9841 Cataract extraction status, right eye: Secondary | ICD-10-CM | POA: Diagnosis not present

## 2022-09-23 DIAGNOSIS — E875 Hyperkalemia: Secondary | ICD-10-CM | POA: Diagnosis not present

## 2022-09-23 DIAGNOSIS — R29818 Other symptoms and signs involving the nervous system: Secondary | ICD-10-CM | POA: Diagnosis present

## 2022-09-23 DIAGNOSIS — Z7401 Bed confinement status: Secondary | ICD-10-CM | POA: Diagnosis not present

## 2022-09-23 DIAGNOSIS — U071 COVID-19: Principal | ICD-10-CM

## 2022-09-23 DIAGNOSIS — R531 Weakness: Secondary | ICD-10-CM

## 2022-09-23 DIAGNOSIS — J4489 Other specified chronic obstructive pulmonary disease: Secondary | ICD-10-CM | POA: Diagnosis present

## 2022-09-23 DIAGNOSIS — I252 Old myocardial infarction: Secondary | ICD-10-CM

## 2022-09-23 DIAGNOSIS — E860 Dehydration: Secondary | ICD-10-CM | POA: Diagnosis present

## 2022-09-23 DIAGNOSIS — Z961 Presence of intraocular lens: Secondary | ICD-10-CM | POA: Diagnosis present

## 2022-09-23 DIAGNOSIS — R110 Nausea: Secondary | ICD-10-CM | POA: Diagnosis not present

## 2022-09-23 DIAGNOSIS — J432 Centrilobular emphysema: Secondary | ICD-10-CM | POA: Diagnosis not present

## 2022-09-23 DIAGNOSIS — Z8673 Personal history of transient ischemic attack (TIA), and cerebral infarction without residual deficits: Secondary | ICD-10-CM

## 2022-09-23 DIAGNOSIS — J449 Chronic obstructive pulmonary disease, unspecified: Secondary | ICD-10-CM | POA: Diagnosis not present

## 2022-09-23 DIAGNOSIS — Z79899 Other long term (current) drug therapy: Secondary | ICD-10-CM

## 2022-09-23 DIAGNOSIS — J438 Other emphysema: Secondary | ICD-10-CM | POA: Diagnosis not present

## 2022-09-23 DIAGNOSIS — N179 Acute kidney failure, unspecified: Secondary | ICD-10-CM | POA: Diagnosis not present

## 2022-09-23 DIAGNOSIS — Z7982 Long term (current) use of aspirin: Secondary | ICD-10-CM

## 2022-09-23 DIAGNOSIS — Z9842 Cataract extraction status, left eye: Secondary | ICD-10-CM | POA: Diagnosis not present

## 2022-09-23 DIAGNOSIS — F319 Bipolar disorder, unspecified: Secondary | ICD-10-CM | POA: Diagnosis not present

## 2022-09-23 DIAGNOSIS — Z0389 Encounter for observation for other suspected diseases and conditions ruled out: Secondary | ICD-10-CM | POA: Diagnosis not present

## 2022-09-23 DIAGNOSIS — G20A1 Parkinson's disease without dyskinesia, without mention of fluctuations: Secondary | ICD-10-CM | POA: Diagnosis present

## 2022-09-23 DIAGNOSIS — G629 Polyneuropathy, unspecified: Secondary | ICD-10-CM | POA: Diagnosis present

## 2022-09-23 DIAGNOSIS — H409 Unspecified glaucoma: Secondary | ICD-10-CM | POA: Diagnosis present

## 2022-09-23 DIAGNOSIS — Z7951 Long term (current) use of inhaled steroids: Secondary | ICD-10-CM

## 2022-09-23 DIAGNOSIS — S0990XA Unspecified injury of head, initial encounter: Secondary | ICD-10-CM | POA: Diagnosis not present

## 2022-09-23 DIAGNOSIS — Z87891 Personal history of nicotine dependence: Secondary | ICD-10-CM

## 2022-09-23 DIAGNOSIS — I6782 Cerebral ischemia: Secondary | ICD-10-CM | POA: Diagnosis not present

## 2022-09-23 DIAGNOSIS — R0602 Shortness of breath: Secondary | ICD-10-CM | POA: Diagnosis not present

## 2022-09-23 DIAGNOSIS — R1111 Vomiting without nausea: Secondary | ICD-10-CM | POA: Diagnosis not present

## 2022-09-23 DIAGNOSIS — G4730 Sleep apnea, unspecified: Secondary | ICD-10-CM | POA: Diagnosis present

## 2022-09-23 DIAGNOSIS — G2581 Restless legs syndrome: Secondary | ICD-10-CM | POA: Diagnosis present

## 2022-09-23 DIAGNOSIS — I10 Essential (primary) hypertension: Secondary | ICD-10-CM | POA: Diagnosis present

## 2022-09-23 DIAGNOSIS — I2510 Atherosclerotic heart disease of native coronary artery without angina pectoris: Secondary | ICD-10-CM | POA: Diagnosis not present

## 2022-09-23 DIAGNOSIS — R0902 Hypoxemia: Secondary | ICD-10-CM | POA: Diagnosis not present

## 2022-09-23 DIAGNOSIS — W19XXXA Unspecified fall, initial encounter: Secondary | ICD-10-CM | POA: Diagnosis not present

## 2022-09-23 LAB — URINALYSIS, ROUTINE W REFLEX MICROSCOPIC
Bilirubin Urine: NEGATIVE
Glucose, UA: NEGATIVE mg/dL
Ketones, ur: NEGATIVE mg/dL
Leukocytes,Ua: NEGATIVE
Nitrite: NEGATIVE
Protein, ur: NEGATIVE mg/dL
Specific Gravity, Urine: 1.011 (ref 1.005–1.030)
pH: 5 (ref 5.0–8.0)

## 2022-09-23 LAB — CBC
HCT: 42.9 % (ref 39.0–52.0)
Hemoglobin: 14.6 g/dL (ref 13.0–17.0)
MCH: 30.7 pg (ref 26.0–34.0)
MCHC: 34 g/dL (ref 30.0–36.0)
MCV: 90.3 fL (ref 80.0–100.0)
Platelets: 153 10*3/uL (ref 150–400)
RBC: 4.75 MIL/uL (ref 4.22–5.81)
RDW: 12.5 % (ref 11.5–15.5)
WBC: 8 10*3/uL (ref 4.0–10.5)
nRBC: 0 % (ref 0.0–0.2)

## 2022-09-23 LAB — RESP PANEL BY RT-PCR (RSV, FLU A&B, COVID)  RVPGX2
Influenza A by PCR: NEGATIVE
Influenza B by PCR: NEGATIVE
Resp Syncytial Virus by PCR: NEGATIVE
SARS Coronavirus 2 by RT PCR: POSITIVE — AB

## 2022-09-23 LAB — TROPONIN I (HIGH SENSITIVITY): Troponin I (High Sensitivity): 24 ng/L — ABNORMAL HIGH (ref <18)

## 2022-09-23 LAB — LACTIC ACID, PLASMA: Lactic Acid, Venous: 1.6 mmol/L (ref 0.5–1.9)

## 2022-09-23 NOTE — ED Triage Notes (Signed)
Pt to ED via [facility] from home---CC of multiple falls throughout the day with concurrent generalized weakness and vomiting. Pt states "sick feeling" has been going on for two days. Pt denies any fevers or pain associated with falls. Given 4 mg Zofran by EMS. VS stable.

## 2022-09-23 NOTE — ED Provider Notes (Signed)
[facility] Provider Note    Event Date/Time   First MD Initiated Contact with Patient 09/23/22 2116     (approximate)   History   Fall and Weakness   HPI  Bradley Bowers is a 76 y.o. male   Past medical history of COPD on 2 L chronically, CAD, GERD, Parkinson's, prior CVA, hyperlipidemia and hypertension who presents to the emergency department with generalized weakness and a fall from home no head strike or loss of consciousness no blood thinners.  Has been feeling fatigued over the last 2 days, dry cough, sore throat.  No fever or chills.  Has had some nausea.  No abdominal pain or urinary symptoms.  No bleeding.  History was obtained via the patient. I also reviewed external medical notes including a discharge summary dated 08/27/2022 when he was admitted for a urinary tract infection.     Physical Exam   Triage Vital Signs: ED Triage Vitals [09/23/22 2054]  Enc Vitals Group     BP      Pulse      Resp      Temp      Temp src      SpO2      Weight      Height      Head Circumference      Peak Flow      Pain Score 0     Pain Loc      Pain Edu?      Excl. in [place]?     Most recent vital signs: Vitals:   09/23/22 2128  BP: (!) 177/80  Pulse: 91  Resp: 14  Temp: 98.9 F (37.2 C)  SpO2: 95%    General: Awake, no distress.  CV:  Good peripheral perfusion.  Resp:  Normal effort.  Abd:  No distention.  Other:  Awake alert and pleasant.  He is on 3 L nasal cannula with oxygen saturation 95% no respiratory distress lungs clear to auscultation without wheezing or focality, abdomen soft nontender, skin appears warm well-perfused he is speaking in full sentences normal mentation alert and oriented.  He has no focal neurological deficits including facial asymmetry, dysarthria, motor or sensory deficits.   ED Results / Procedures / Treatments   Labs (all labs ordered are listed, but only abnormal results are displayed) Labs Reviewed   RESP PANEL BY RT-PCR (RSV, FLU A&B, COVID)  [id number] - Abnormal; Notable for the following components:      Result Value   SARS Coronavirus 2 by RT PCR POSITIVE (*)    All other components within normal limits  URINALYSIS, ROUTINE W REFLEX MICROSCOPIC - Abnormal; Notable for the following components:   Color, Urine YELLOW (*)    APPearance CLEAR (*)    Hgb urine dipstick SMALL (*)    Bacteria, UA RARE (*)    All other components within normal limits  TROPONIN I (HIGH SENSITIVITY) - Abnormal; Notable for the following components:   Troponin I (High Sensitivity) 24 (*)    All other components within normal limits  URINE CULTURE  CBC  LACTIC ACID, PLASMA  LACTIC ACID, PLASMA  COMPREHENSIVE METABOLIC PANEL  CBG MONITORING, ED  TROPONIN I (HIGH SENSITIVITY)     I reviewed labs and they are notable for positive covid troponin 24 which is consistent with his baseline  EKG  ED ECG REPORT I, Lucillie Garfinkel, the attending physician, personally viewed and interpreted this ECG.   Date: 09/23/2022  EKG  Time: 2249  Rate: 75  Rhythm: normal sinus rhythm  Axis: nl  Intervals:none  ST&T Change: No acute ischemic changes    RADIOLOGY I independently reviewed and interpreted CT scan of the head see no obvious bleeding or midline shift.   PROCEDURES:  Critical Care performed: No  Procedures   MEDICATIONS ORDERED IN ED: Medications - No data to display  Consultants:  I spoke with hospitalist for admission and regarding care plan for this patient.   IMPRESSION / MDM / ASSESSMENT AND PLAN / ED COURSE  I reviewed the triage vital signs and the nursing notes.                              Differential diagnosis includes, but is not limited to, viral infection, bacterial pneumonia, metabolic derangements, dehydration AKI, ACS, PE, CVA intracranial bleeding  MDM: Patient with COVID-positive and symptoms consistent with COVID infection.  He appears to be close to his baseline O2  requirement no respiratory distress, lab work unremarkable however the patient is profoundly weak and unable to get around his house had a fall earlier today usually ambulatory with a walker and able to perform activities of daily living.  Has no focal deficits at this time so doubt CVA.  Rare bacteria no urinary symptoms would not treat urinary tract infection.  Admission.  COVID vaccinated x 3.   Patient's presentation is most consistent with acute presentation with potential threat to life or bodily function.       FINAL CLINICAL IMPRESSION(S) / ED DIAGNOSES   Final diagnoses:  COVID  Generalized weakness     Rx / DC Orders   ED Discharge Orders     None        Note:  This document was prepared using Dragon voice recognition software and may include unintentional dictation errors.    Lucillie Garfinkel, MD 09/23/22 2252

## 2022-09-24 DIAGNOSIS — Z8673 Personal history of transient ischemic attack (TIA), and cerebral infarction without residual deficits: Secondary | ICD-10-CM

## 2022-09-24 DIAGNOSIS — R531 Weakness: Secondary | ICD-10-CM | POA: Diagnosis not present

## 2022-09-24 DIAGNOSIS — U071 COVID-19: Secondary | ICD-10-CM

## 2022-09-24 LAB — COMPREHENSIVE METABOLIC PANEL
ALT: 27 U/L (ref 0–44)
AST: 30 U/L (ref 15–41)
Albumin: 3.5 g/dL (ref 3.5–5.0)
Alkaline Phosphatase: 72 U/L (ref 38–126)
Anion gap: 12 (ref 5–15)
BUN: 39 mg/dL — ABNORMAL HIGH (ref 8–23)
CO2: 28 mmol/L (ref 22–32)
Calcium: 8.7 mg/dL — ABNORMAL LOW (ref 8.9–10.3)
Chloride: 97 mmol/L — ABNORMAL LOW (ref 98–111)
Creatinine, Ser: 1.64 mg/dL — ABNORMAL HIGH (ref 0.61–1.24)
GFR, Estimated: 44 mL/min — ABNORMAL LOW (ref >60)
Glucose, Bld: 94 mg/dL (ref 70–99)
Potassium: 3.9 mmol/L (ref 3.5–5.1)
Sodium: 137 mmol/L (ref 135–145)
Total Bilirubin: 0.7 mg/dL (ref 0.3–1.2)
Total Protein: 7.4 g/dL (ref 6.5–8.1)

## 2022-09-24 LAB — TROPONIN I (HIGH SENSITIVITY): Troponin I (High Sensitivity): 34 ng/L — ABNORMAL HIGH (ref <18)

## 2022-09-24 LAB — C-REACTIVE PROTEIN: CRP: 6.7 mg/dL — ABNORMAL HIGH (ref <1.0)

## 2022-09-24 LAB — LACTIC ACID, PLASMA: Lactic Acid, Venous: 1.1 mmol/L (ref 0.5–1.9)

## 2022-09-24 MED ORDER — TORSEMIDE 20 MG PO TABS
20.0000 mg | ORAL_TABLET | Freq: Every day | ORAL | Status: DC
  Administered 2022-09-24 – 2022-09-25 (×2): 20 mg via ORAL
  Filled 2022-09-24 (×2): qty 1

## 2022-09-24 MED ORDER — TIOTROPIUM BROMIDE MONOHYDRATE 18 MCG IN CAPS
18.0000 ug | ORAL_CAPSULE | Freq: Every day | RESPIRATORY_TRACT | Status: DC
  Administered 2022-09-24 – 2022-10-08 (×14): 18 ug via RESPIRATORY_TRACT
  Filled 2022-09-24 (×3): qty 5

## 2022-09-24 MED ORDER — ALBUTEROL SULFATE HFA 108 (90 BASE) MCG/ACT IN AERS: 1.0000 | INHALATION_SPRAY | RESPIRATORY_TRACT | Status: DC | PRN

## 2022-09-24 MED ORDER — HYDROCOD POLI-CHLORPHE POLI ER 10-8 MG/5ML PO SUER
5.0000 mL | Freq: Two times a day (BID) | ORAL | Status: DC | PRN
  Administered 2022-09-25 – 2022-10-05 (×2): 5 mL via ORAL
  Filled 2022-09-24 (×2): qty 5

## 2022-09-24 MED ORDER — ACETAMINOPHEN 325 MG PO TABS
650.0000 mg | ORAL_TABLET | Freq: Four times a day (QID) | ORAL | Status: DC | PRN
  Administered 2022-09-29: 650 mg via ORAL
  Filled 2022-09-24: qty 2

## 2022-09-24 MED ORDER — HYDROCHLOROTHIAZIDE 25 MG PO TABS
25.0000 mg | ORAL_TABLET | Freq: Every day | ORAL | Status: DC
  Administered 2022-09-24 – 2022-09-25 (×2): 25 mg via ORAL
  Filled 2022-09-24 (×2): qty 1

## 2022-09-24 MED ORDER — ASPIRIN 81 MG PO TBEC
81.0000 mg | DELAYED_RELEASE_TABLET | Freq: Every day | ORAL | Status: DC
  Administered 2022-09-24 – 2022-10-08 (×15): 81 mg via ORAL
  Filled 2022-09-24 (×15): qty 1

## 2022-09-24 MED ORDER — CARBAMAZEPINE 200 MG PO TABS
200.0000 mg | ORAL_TABLET | Freq: Two times a day (BID) | ORAL | Status: DC
  Administered 2022-09-24 – 2022-10-08 (×30): 200 mg via ORAL
  Filled 2022-09-24 (×30): qty 1

## 2022-09-24 MED ORDER — DILTIAZEM HCL ER COATED BEADS 180 MG PO CP24
180.0000 mg | ORAL_CAPSULE | Freq: Every day | ORAL | Status: DC
  Administered 2022-09-24 – 2022-10-08 (×15): 180 mg via ORAL
  Filled 2022-09-24 (×15): qty 1

## 2022-09-24 MED ORDER — HYDROCODONE-ACETAMINOPHEN 5-325 MG PO TABS: 1.0000 | ORAL_TABLET | ORAL | Status: DC | PRN

## 2022-09-24 MED ORDER — TIMOLOL MALEATE 0.5 % OP SOLN
1.0000 [drp] | Freq: Two times a day (BID) | OPHTHALMIC | Status: DC
  Administered 2022-09-24 – 2022-10-08 (×29): 1 [drp] via OPHTHALMIC
  Filled 2022-09-24: qty 5

## 2022-09-24 MED ORDER — PANTOPRAZOLE SODIUM 40 MG PO TBEC
40.0000 mg | DELAYED_RELEASE_TABLET | Freq: Two times a day (BID) | ORAL | Status: DC
  Administered 2022-09-24 – 2022-10-08 (×30): 40 mg via ORAL
  Filled 2022-09-24 (×30): qty 1

## 2022-09-24 MED ORDER — SPIRONOLACTONE 25 MG PO TABS
25.0000 mg | ORAL_TABLET | Freq: Every day | ORAL | Status: DC
  Administered 2022-09-24 – 2022-09-25 (×2): 25 mg via ORAL
  Filled 2022-09-24 (×2): qty 1

## 2022-09-24 MED ORDER — LISINOPRIL 5 MG PO TABS
2.5000 mg | ORAL_TABLET | Freq: Every day | ORAL | Status: DC
  Administered 2022-09-24 – 2022-09-25 (×2): 2.5 mg via ORAL
  Filled 2022-09-24 (×3): qty 1

## 2022-09-24 MED ORDER — DIVALPROEX SODIUM ER 500 MG PO TB24
500.0000 mg | ORAL_TABLET | Freq: Every day | ORAL | Status: DC
  Administered 2022-09-24 – 2022-10-08 (×15): 500 mg via ORAL
  Filled 2022-09-24 (×13): qty 1
  Filled 2022-09-24: qty 2
  Filled 2022-09-24: qty 1

## 2022-09-24 MED ORDER — GUAIFENESIN-DM 100-10 MG/5ML PO SYRP
10.0000 mL | ORAL_SOLUTION | ORAL | Status: DC | PRN
  Administered 2022-09-27 – 2022-10-03 (×10): 10 mL via ORAL
  Filled 2022-09-24 (×10): qty 10

## 2022-09-24 MED ORDER — ONDANSETRON HCL 4 MG/2ML IJ SOLN: 4.0000 mg | Freq: Four times a day (QID) | INTRAMUSCULAR | Status: DC | PRN

## 2022-09-24 MED ORDER — MONTELUKAST SODIUM 10 MG PO TABS
10.0000 mg | ORAL_TABLET | Freq: Every day | ORAL | Status: DC
  Administered 2022-09-24 – 2022-10-07 (×15): 10 mg via ORAL
  Filled 2022-09-24 (×15): qty 1

## 2022-09-24 MED ORDER — MOMETASONE FURO-FORMOTEROL FUM 100-5 MCG/ACT IN AERO
2.0000 | INHALATION_SPRAY | Freq: Two times a day (BID) | RESPIRATORY_TRACT | Status: DC
  Administered 2022-09-24 – 2022-10-08 (×29): 2 via RESPIRATORY_TRACT
  Filled 2022-09-24: qty 8.8

## 2022-09-24 MED ORDER — ONDANSETRON HCL 4 MG PO TABS
4.0000 mg | ORAL_TABLET | Freq: Four times a day (QID) | ORAL | Status: DC | PRN
  Administered 2022-10-06: 4 mg via ORAL
  Filled 2022-09-24: qty 1

## 2022-09-24 MED ORDER — ENOXAPARIN SODIUM 60 MG/0.6ML IJ SOSY
0.5000 mg/kg | PREFILLED_SYRINGE | INTRAMUSCULAR | Status: DC
  Administered 2022-09-24 – 2022-10-08 (×15): 45 mg via SUBCUTANEOUS
  Filled 2022-09-24 (×15): qty 0.6

## 2022-09-24 MED ORDER — ATORVASTATIN CALCIUM 20 MG PO TABS
80.0000 mg | ORAL_TABLET | Freq: Every day | ORAL | Status: DC
  Administered 2022-09-24 – 2022-09-25 (×2): 80 mg via ORAL
  Filled 2022-09-24 (×3): qty 4

## 2022-09-24 MED ORDER — SODIUM CHLORIDE 0.9 % IV BOLUS
1000.0000 mL | Freq: Once | INTRAVENOUS | Status: AC
  Administered 2022-09-24: 1000 mL via INTRAVENOUS

## 2022-09-24 MED ORDER — DONEPEZIL HCL 5 MG PO TABS
10.0000 mg | ORAL_TABLET | Freq: Every day | ORAL | Status: DC
  Administered 2022-09-24 – 2022-10-07 (×15): 10 mg via ORAL
  Filled 2022-09-24 (×16): qty 2

## 2022-09-24 NOTE — Assessment & Plan Note (Addendum)
Continue donepezil 

## 2022-09-24 NOTE — Care Management CC44 (Signed)
Condition Code 44 Documentation Completed  Patient Details  Name: Bradley Bowers MRN: [id number] Date of Birth: 1946/05/07   Condition Code 44 given:   Yes Patient signature on Condition Code 44 notice:  Yes verbal consent  Documentation of 2 MD's agreement:    Code 44 added to claim:       [name], [place] 09/24/2022, 4:48 PM

## 2022-09-24 NOTE — IPAL (Signed)
  Interdisciplinary Goals of Care Family Meeting   Date carried out: 09/24/2022  Location of the meeting: Bedside  Member's involved: Physician  Durable Power of Attorney or acting medical decision maker: patient    Discussion: We discussed goals of care for Bradley Bowers .    Code status:   Code Status: Full Code   Disposition: Continue current acute care  Time spent for the meeting: [place], MD  09/24/2022, 12:45 AM

## 2022-09-24 NOTE — TOC Initial Note (Incomplete)
Transition of Care [facility]) - Initial/Assessment Note    Patient Details  Name: Bradley Bowers MRN: [id number] Date of Birth: 15-Dec-1945  Transition of Care [facility]) CM/SW Contact:    Shelbie Hutching, RN Phone Number: 09/24/2022, 1:30 PM  Clinical Narrative:                 Patient admitted to the hospital with COVID infection and weakness.  Patient reports   Expected Discharge Plan: [place] Barriers to Discharge: Continued Medical Work up   Patient Goals and CMS Choice Patient states their goals for this hospitalization and ongoing recovery are:: Patient would like to go to rehab at [facility] CMS Medicare.gov Compare Post Acute Care list provided to:: Patient Choice offered to / list presented to : Patient  Expected Discharge Plan and Services Expected Discharge Plan: [place]   Discharge Planning Services: CM Consult Post Acute Care Choice: [place] Living arrangements for the past 2 months: Single Family Home                 DME Arranged: N/A DME Agency: NA       HH Arranged: NA HH Agency: NA        Prior Living Arrangements/Services Living arrangements for the past 2 months: Single Family Home Lives with:: Spouse Patient language and need for interpreter reviewed:: Yes Do you feel safe going back to the place where you live?: Yes      Need for Family Participation in Patient Care: Yes (Comment) Care giver support system in place?: Yes (comment) Current home services: DME (cane and RW) Criminal Activity/Legal Involvement Pertinent to Current Situation/Hospitalization: No - Comment as needed  Activities of Daily Living      Permission Sought/Granted Permission sought to share information with : Case Manager, Customer service manager, Family Supports Permission granted to share information with : Yes, Verbal Permission Granted  Share Information with NAME: Kenyon Eichelberger  Permission granted to share info w  AGENCY: [facility]  Permission granted to share info w Relationship: spouse  Permission granted to share info w Contact Information: [phone]  Emotional Assessment Appearance:: Appears stated age Attitude/Demeanor/Rapport: Engaged Affect (typically observed): Accepting Orientation: : Oriented to Self, Oriented to Place, Oriented to  Time, Oriented to Situation Alcohol / Substance Use: Not Applicable Psych Involvement: No (comment)  Admission diagnosis:  Generalized weakness [R53.1] Patient Active Problem List   Diagnosis Date Noted   COVID-19 virus infection 09/24/2022   History of TIA (transient ischemic attack) 09/24/2022   Generalized weakness 41/66/0630   Acute metabolic encephalopathy 16/10/930   Urinary tract infection in male 08/25/2022   AKI (acute kidney injury) ([place]) 08/25/2022   Parkinsonian features    CVA (cerebral vascular accident) ([place]) 01/31/2022   Weakness    Essential hypertension 01/30/2022   Peripheral neuropathy 01/30/2022   TIA (transient ischemic attack) 01/29/2022   Acute respiratory failure with hypoxia ([place]) 05/22/2021   COPD with acute exacerbation ([place]) 05/22/2021   Hyperlipemia 05/22/2021   Bipolar 1 disorder ([place]) 05/22/2021   Renal insufficiency 05/22/2021   Leg edema 05/22/2021   Coronary artery disease 09/24/2016   Glaucoma 09/24/2016   Bipolar affective disorder, current episode manic with psychotic symptoms ([place]) 09/23/2016   Chronic obstructive pulmonary disease (COPD) ([place]) 11/06/2015   Gastro-esophageal reflux disease without esophagitis 10/24/2014   Obstructive apnea 10/24/2014   Restless leg 10/24/2014   PCP:  Rusty Aus, MD Pharmacy:   [id number] - [place], [place] -  [address] [address] Phone: [phone] Fax: [phone]  [id number] [id number]: [phone]: [phone]    Social  Determinants of Health (SDOH) Interventions    Readmission Risk Interventions     No data to display

## 2022-09-24 NOTE — ED Notes (Signed)
This RN entered the pt's room and noticed the bed was wet from pt's IV fluids. The connection was loose between the iv line and the pt's catheter. Linens were changed, pt was assisted to use the urinal bedside, and assisted pt back into the bed.

## 2022-09-24 NOTE — Assessment & Plan Note (Signed)
Patient with 1 day of cough without hypoxia and chest x-ray clear Supportive care Airborne precautions

## 2022-09-24 NOTE — H&P (Signed)
History and Physical    Patient: Bradley Bowers DOB: 11/06/1945 DOA: 09/23/2022 DOS: the patient was seen and examined on 09/24/2022 PCP: Rusty Aus, MD  Patient coming from: Home  Chief Complaint:  Chief Complaint  Patient presents with   Fall   Weakness    HPI: Bradley Bowers is a 76 y.o. male with medical history significant for CAD, HTN, GERD, peripheral neuropathy, parkinsonism, prior stroke, bipolar 1 disorder, COPD, hospitalized recently from 11/19 to 11/29 with a UTI presenting with abdominal pain and difficulty ambulating who was brought to the ED with a 2-day history of generalized weakness resulting in 2 falls.  He states he has fallen before but this was different because he was unable to get up on his own or remain standing due to the weakness.  He started coughing a day ago and today had an episode of nonbloody nonbilious vomiting.  He denies chest pain, fever or chills or shortness of breath patient denies abdominal pain, dysuria, diarrhea. ED course and data review: BP 177/80 with otherwise normal vitals.  COVID positive, flu and RSV negative.  CBC unremarkable troponin 24, lactic acid 1.6, urinalysis unremarkable.  CMP pending EKG personally viewed and interpreted showing sinus at 75 with no acute ST-T wave changes.  Chest x-ray showed no acute disease.  Head CT unremarkable. Hospitalist consulted for admission due to weakness and falls..   Review of Systems: As mentioned in the history of present illness. All other systems reviewed and are negative.  Past Medical History:  Diagnosis Date   Arterial vascular disease 10/24/2014   Overview:  MI 1994, small-vessel disease requiring coumadin    Asthma    Centrilobular emphysema ([place]) 10/24/2014   Overview:  2L O2 prn    Centrilobular emphysema (HCC)    Chronic obstructive pulmonary disease ([place]) 11/06/2015   Coronary artery disease    GERD (gastroesophageal reflux disease)    Glaucoma    Heart  attack ([place]) 2/17, 1994   Heart disease    Hyperlipemia    Hypertension    Restless leg 10/24/2014   Sleep apnea    Past Surgical History:  Procedure Laterality Date   CATARACT EXTRACTION W/ INTRAOCULAR LENS  IMPLANT, BILATERAL     COLONOSCOPY WITH PROPOFOL N/A 02/19/2018   Procedure: COLONOSCOPY WITH PROPOFOL;  Surgeon: Lollie Sails, MD;  Location[facility] ENDOSCOPY;  Service: Endoscopy;  Laterality: N/A;   ESOPHAGOGASTRODUODENOSCOPY (EGD) WITH PROPOFOL N/A 02/19/2018   Procedure: ESOPHAGOGASTRODUODENOSCOPY (EGD) WITH PROPOFOL;  Surgeon: Lollie Sails, MD;  Location: [facility] ENDOSCOPY;  Service: Endoscopy;  Laterality: N/A;   EYE SURGERY     Social History:  reports that he quit smoking about 28 years ago. His smoking use included cigarettes. He has a 30.00 pack-year smoking history. He has never used smokeless tobacco. He reports that he does not drink alcohol and does not use drugs.  No Known Allergies  Family History  Problem Relation Age of Onset   Cancer Mother    Nephrolithiasis Father    Prostate cancer Neg Hx    Bladder Cancer Neg Hx    Kidney cancer Neg Hx     Prior to Admission medications   Medication Sig Start Date End Date Taking? Authorizing Provider  ADVAIR DISKUS 100-50 MCG/ACT AEPB Inhale 1 puff into the lungs 2 (two) times daily. 01/10/21   [provider]  albuterol (VENTOLIN HFA) 108 (90 Base) MCG/ACT inhaler Inhale 1-2 puffs into the lungs every 4 (four) hours as  needed.    [provider]  aspirin 81 MG EC tablet Take 81 mg by mouth daily. 11/14/15   [provider]  atorvastatin (LIPITOR) 80 MG tablet Take 1 tablet (80 mg total) by mouth daily. 02/04/22 03/06/22  Richarda Osmond, MD  carbamazepine (TEGRETOL) 200 MG tablet Take 1 tablet by mouth 2 (two) times daily. 01/12/21 08/25/22  [provider]  diltiazem (CARDIZEM CD) 180 MG 24 hr capsule Take 180 mg by mouth daily. 08/09/22   [provider]  divalproex  (DEPAKOTE ER) 250 MG 24 hr tablet Take 500 mg by mouth daily. 07/02/22 06/27/23  [provider]  donepezil (ARICEPT) 10 MG tablet Take 1 tablet by mouth at bedtime. 07/11/22 07/11/23  [provider]  fluticasone (FLONASE) 50 MCG/ACT nasal spray Place 1 spray into both nostrils daily.    [provider]  hydrochlorothiazide (HYDRODIURIL) 25 MG tablet Take 25 mg by mouth daily. 04/28/22   [provider]  lisinopril (ZESTRIL) 2.5 MG tablet Take 1 tablet (2.5 mg total) by mouth daily. 02/04/22 03/06/22  Richarda Osmond, MD  montelukast (SINGULAIR) 10 MG tablet Take 10 mg by mouth at bedtime.    [provider]  pantoprazole (PROTONIX) 40 MG tablet Take 40 mg by mouth 2 (two) times daily. 07/11/22   [provider]  spironolactone (ALDACTONE) 25 MG tablet Take 1 tablet by mouth daily. 07/11/22   [provider]  timolol (TIMOPTIC) 0.5 % ophthalmic solution 1 drop 2 (two) times daily.    [provider]  tiotropium (SPIRIVA) 18 MCG inhalation capsule Place 18 mcg into inhaler and inhale daily.    [provider]  torsemide (DEMADEX) 20 MG tablet Take 20 mg by mouth daily. 08/07/22 08/07/23  [provider]  vitamin B-12 1000 MCG tablet Take 1 tablet (1,000 mcg total) by mouth daily. 10/07/16   Clovis Fredrickson, MD    Physical Exam: Vitals:   09/23/22 2128  BP: (!) 177/80  Pulse: 91  Resp: 14  Temp: 98.9 F (37.2 C)  TempSrc: Oral  SpO2: 95%   Physical Exam Vitals and nursing note reviewed.  Constitutional:      General: He is not in acute distress. HENT:     Head: Normocephalic and atraumatic.  Cardiovascular:     Rate and Rhythm: Normal rate and regular rhythm.     Heart sounds: Normal heart sounds.  Pulmonary:     Effort: Pulmonary effort is normal.     Breath sounds: Normal breath sounds.  Abdominal:     Palpations: Abdomen is soft.     Tenderness: There is no abdominal tenderness.   Neurological:     Mental Status: Mental status is at baseline.     Labs on Admission: I have personally reviewed following labs and imaging studies  CBC: Recent Labs  Lab 09/23/22 2100  WBC 8.0  HGB 14.6  HCT 42.9  MCV 90.3  PLT 161   Basic Metabolic Panel: No results for input(s): "NA", "K", "CL", "CO2", "GLUCOSE", "BUN", "CREATININE", "CALCIUM", "MG", "PHOS" in the last 168 hours. GFR: CrCl cannot be calculated (Patient's most recent lab result is older than the maximum 21 days allowed.). Liver Function Tests: No results for input(s): "AST", "ALT", "ALKPHOS", "BILITOT", "PROT", "ALBUMIN" in the last 168 hours. No results for input(s): "LIPASE", "AMYLASE" in the last 168 hours. No results for input(s): "AMMONIA" in the last 168 hours. Coagulation Profile: No results for input(s): "INR", "PROTIME" in the last  168 hours. Cardiac Enzymes: No results for input(s): "CKTOTAL", "CKMB", "CKMBINDEX", "TROPONINI" in the last 168 hours. BNP (last 3 results) No results for input(s): "PROBNP" in the last 8760 hours. HbA1C: No results for input(s): "HGBA1C" in the last 72 hours. CBG: No results for input(s): "GLUCAP" in the last 168 hours. Lipid Profile: No results for input(s): "CHOL", "HDL", "LDLCALC", "TRIG", "CHOLHDL", "LDLDIRECT" in the last 72 hours. Thyroid Function Tests: No results for input(s): "TSH", "T4TOTAL", "FREET4", "T3FREE", "THYROIDAB" in the last 72 hours. Anemia Panel: No results for input(s): "VITAMINB12", "FOLATE", "FERRITIN", "TIBC", "IRON", "RETICCTPCT" in the last 72 hours. Urine analysis:    Component Value Date/Time   COLORURINE YELLOW (A) 09/23/2022 2129   APPEARANCEUR CLEAR (A) 09/23/2022 2129   APPEARANCEUR Clear 02/20/2016 1102   LABSPEC 1.011 09/23/2022 2129   PHURINE 5.0 09/23/2022 2129   GLUCOSEU NEGATIVE 09/23/2022 2129   HGBUR SMALL (A) 09/23/2022 2129   BILIRUBINUR NEGATIVE 09/23/2022 2129   BILIRUBINUR Negative 02/20/2016 [place] NEGATIVE 09/23/2022 2129   PROTEINUR NEGATIVE 09/23/2022 2129   NITRITE NEGATIVE 09/23/2022 2129   LEUKOCYTESUR NEGATIVE 09/23/2022 2129    Radiological Exams on Admission: DG Chest Port 1 View  Result Date: 09/23/2022 CLINICAL DATA:  Questionable sepsis EXAM: PORTABLE CHEST 1 VIEW COMPARISON:  Chest x-ray 08/25/2022 FINDINGS: The heart size and mediastinal contours are within normal limits. Both lungs are clear. The visualized skeletal structures are unremarkable. IMPRESSION: No active disease. Electronically Signed   By: Ronney Asters M.D.   On: 09/23/2022 21:55   CT Head Wo Contrast  Result Date: 09/23/2022 CLINICAL DATA:  Head trauma, minor EXAM: CT HEAD WITHOUT CONTRAST TECHNIQUE: Contiguous axial images were obtained from the base of the skull through the vertex without intravenous contrast. RADIATION DOSE REDUCTION: This exam was performed according to the departmental dose-optimization program which includes automated exposure control, adjustment of the mA and/or kV according to patient size and/or use of iterative reconstruction technique. COMPARISON:  CT head dated August 25, 2022 FINDINGS: Brain: No evidence of acute infarction, hemorrhage, hydrocephalus, extra-axial collection or mass lesion/mass effect. Prominence of the ventricles and sulci secondary to moderate generalized cerebral volume loss, unchanged. Patchy area of low-attenuation of the periventricular white matter presumed chronic microvascular ischemic changes. Vascular: No hyperdense vessel or unexpected calcification. Skull: Normal. Negative for fracture or focal lesion. Sinuses/Orbits: No acute finding.  Bilateral cataract surgery. Other: None. IMPRESSION: 1. No acute intracranial abnormality. 2. Chronic microvascular ischemic changes of the periventricular white matter. 3. Moderate generalized cerebral volume loss, unchanged. Electronically Signed   By: Keane Police D.O.   On: 09/23/2022 21:40     Data  Reviewed: Relevant notes from primary care and specialist visits, past discharge summaries as available in EHR, including Care Everywhere. Prior diagnostic testing as pertinent to current admission diagnoses Updated medications and problem lists for reconciliation ED course, including vitals, labs, imaging, treatment and response to treatment Triage notes, nursing and pharmacy notes and ED provider's notes Notable results as noted in HPI   Assessment and Plan: * Generalized weakness Multifactorial, related to COVID infection superimposed on chronic comorbid conditions including peripheral neuropathy, parkinsonism, restless leg syndrome PT eval Will hydrate with 1 L monitor  COVID-19 virus infection Patient with 1 day of cough without hypoxia and chest x-ray clear Supportive care Airborne precautions  Chronic obstructive pulmonary disease (COPD) ([place]) Not acutely exacerbated Continue Advair with albuterol as needed  History of TIA (transient ischemic attack) Continue aspirin and statin  Parkinsonian  features Continue donepezil  Essential hypertension Continue spironolactone, diltiazem and HCTZ  Coronary artery disease No active chest pain, troponin slightly elevated at 24.  EKG nonacute Continue aspirin  Bipolar affective disorder, current episode manic with psychotic symptoms (HCC) Continue Depakote, carbamazepine        DVT prophylaxis: Lovenox  Consults: none  Advance Care Planning:   Code Status: Prior   Family Communication: none  Disposition Plan: Back to previous home environment  Severity of Illness: The appropriate patient status for this patient is INPATIENT. Inpatient status is judged to be reasonable and necessary in order to provide the required intensity of service to ensure the patient's safety. The patient's presenting symptoms, physical exam findings, and initial radiographic and laboratory data in the context of their chronic comorbidities is  felt to place them at high risk for further clinical deterioration. Furthermore, it is not anticipated that the patient will be medically stable for discharge from the hospital within 2 midnights of admission.   * I certify that at the point of admission it is my clinical judgment that the patient will require inpatient hospital care spanning beyond 2 midnights from the point of admission due to high intensity of service, high risk for further deterioration and high frequency of surveillance required.*  Author: Athena Masse, MD 09/24/2022 12:35 AM  For on call review [email or web address].

## 2022-09-24 NOTE — Assessment & Plan Note (Signed)
Continue Depakote, carbamazepine

## 2022-09-24 NOTE — Progress Notes (Signed)
Anticoagulation monitoring(Lovenox):  76 yo male ordered Lovenox 40 mg Q24h    Filed Weights   09/24/22 0316  Weight: 88.8 kg (195 lb 12.8 oz)   BMI 33   Lab Results  Component Value Date   CREATININE 1.64 (H) 09/24/2022   CREATININE 1.18 08/27/2022   CREATININE 1.65 (H) 08/26/2022   Estimated Creatinine Clearance: 41.3 mL/min (A) (by C-G formula based on SCr of 1.64 mg/dL (H)). Hemoglobin & Hematocrit     Component Value Date/Time   HGB 14.6 09/23/2022 2100   HCT 42.9 09/23/2022 2100     Per Protocol for Patient with estCrcl > 30 ml/min and BMI > 30, will transition to Lovenox 45 mg Q24h.

## 2022-09-24 NOTE — Progress Notes (Signed)
  PROGRESS NOTE    Bradley Bowers  WUJ:[id number] DOB: 25-Mar-1946 DOA: 09/23/2022 PCP: Rusty Aus, MD  115A/115A-AA  LOS: 1 day   Brief hospital course:   Assessment & Plan: [name] is a 76 y.o. male with medical history significant for CAD, HTN, GERD, peripheral neuropathy, parkinsonism, prior stroke, bipolar 1 disorder, COPD on 2L at baseline, hospitalized recently from 11/19 to 11/29 with a UTI presenting with abdominal pain and difficulty ambulating who was brought to the ED with a 2-day history of generalized weakness resulting in 2 falls.  He states he has fallen before but this was different because he was unable to get up on his own or remain standing due to the weakness.    * Generalized weakness and falls Multifactorial, related to COVID infection superimposed on chronic comorbid conditions including peripheral neuropathy, parkinsonism, restless leg syndrome --PT/OT, rec SNF rehab  COVID-19 virus infection Patient with 1 day of cough without hypoxia and chest x-ray clear Supportive care Airborne precautions  Chronic obstructive pulmonary disease (COPD) ([place]) Chronic hypoxemic respiratory failure on 2L O2 at baseline Not acutely exacerbated --cont Dulera and Spiriva --cont home 2L O2  History of TIA (transient ischemic attack) Continue aspirin and statin  Parkinsonian features Continue donepezil  Essential hypertension Continue spironolactone, diltiazem and HCTZ, lisinopril, torsemide  Coronary artery disease No active chest pain, troponin slightly elevated at 24.  EKG nonacute --cont ASA and statin  Bipolar affective disorder, current episode manic with psychotic symptoms (HCC) Continue Depakote, carbamazepine   DVT prophylaxis: Lovenox SQ Code Status: Full code  Family Communication:  Level of care: Med-Surg Dispo:   The patient is from: home Anticipated d/c is to: SNF rehab Anticipated d/c date is: whenever bed available   Subjective  and Interval History:  Pt reported sputum production with cough.  Main complaint was weakness and couldn't walk.  PT rec SNF rehab.   Objective: Vitals:   09/24/22 1300 09/24/22 1330 09/24/22 1359 09/24/22 1626  BP:   124/83 122/65  Pulse: 70 67 67 70  Resp: '12 14 19 17  '$ Temp:   97.9 F (36.6 C) (!) 97.4 F (36.3 C)  TempSrc:      SpO2: 97% 99% 99% 93%  Weight:      Height:        Intake/Output Summary (Last 24 hours) at 09/24/2022 1706 Last data filed at 09/24/2022 1535 Gross per 24 hour  Intake 1000 ml  Output 500 ml  Net 500 ml   Filed Weights   09/24/22 0316  Weight: 88.8 kg    Examination:   Constitutional: NAD, AAOx3 HEENT: conjunctivae and lids normal, EOMI CV: No cyanosis.   RESP: normal respiratory effort, on 3L Neuro: II - XII grossly intact.   Psych: Normal mood and affect.  Appropriate judgement and reason   Data Reviewed: I have personally reviewed labs and imaging studies  Time spent: 35 minutes  Enzo Bi, MD [facility] If 7PM-7AM, please contact night-coverage 09/24/2022, 5:06 PM

## 2022-09-24 NOTE — Assessment & Plan Note (Signed)
Continue aspirin and statin. 

## 2022-09-24 NOTE — Assessment & Plan Note (Signed)
Multifactorial, related to COVID infection superimposed on chronic comorbid conditions including peripheral neuropathy, parkinsonism, restless leg syndrome PT eval Will hydrate with 1 L monitor

## 2022-09-24 NOTE — Assessment & Plan Note (Signed)
Not acutely exacerbated Continue Advair with albuterol as needed

## 2022-09-24 NOTE — Assessment & Plan Note (Signed)
No active chest pain, troponin slightly elevated at 24.  EKG nonacute Continue aspirin

## 2022-09-24 NOTE — Evaluation (Signed)
Physical Therapy Evaluation Patient Details Name: Bradley Bowers MRN: [id number] DOB: 04/18/1946 Today's Date: 09/24/2022  History of Present Illness  76 y/o male presented to ED on 09/23/22 for weakness, falls, and "feeling sick". Found to be COVID+. PMH: bipolar 1 disorder, CVA, hypertension, Parkinson's, hyperlipidemia, COPD, peripheral neuropathy  Clinical Impression  Patient admitted with the above. PTA, patient lives with wife and reports recently using RW since last admission and has had 8 falls in past month since discharge. He presents with weakness, impaired balance, and decreased activity tolerance. Patient required min guard for bed mobility and sit to stand from elevated surface. Required minA for balance during sidesteps at EOB with HHAx1 due to R lateral lean and inability to correct without assistance. On 3L O2 initially, spO2 97% thus removed for mobility with drop to 84% with short mobility task. Cues for pursed lip breathing with no increase. Donned 3L O2 with slow increase to 94% and left patient on 3L at end of session. Patient will benefit from skilled PT services during acute stay to address listed deficits. Recommend SNF at discharge to maximize functional independence and safety as patient currently not feeling steady on his feet to return home with wife. Will continue to follow acutely.      Recommendations for follow up therapy are one component of a multi-disciplinary discharge planning process, led by the attending physician.  Recommendations may be updated based on patient status, additional functional criteria and insurance authorization.  Follow Up Recommendations Skilled nursing-short term rehab (<3 hours/day) Can patient physically be transported by private vehicle: Yes    Assistance Recommended at Discharge Frequent or constant Supervision/Assistance  Patient can return home with the following  A lot of help with walking and/or transfers;A little help with  bathing/dressing/bathroom;Assistance with cooking/housework;Assist for transportation;Help with stairs or ramp for entrance    Equipment Recommendations None recommended by PT  Recommendations for Other Services       Functional Status Assessment Patient has had a recent decline in their functional status and demonstrates the ability to make significant improvements in function in a reasonable and predictable amount of time.     Precautions / Restrictions Precautions Precautions: Fall Precaution Comments: watch O2 Restrictions Weight Bearing Restrictions: No      Mobility  Bed Mobility Overal bed mobility: Needs Assistance Bed Mobility: Supine to Sit, Sit to Supine     Supine to sit: Min guard Sit to supine: Min guard   General bed mobility comments: min guard for safety from high stretcher    Transfers Overall transfer level: Needs assistance Equipment used: 1 person hand held assist Transfers: Sit to/from Stand Sit to Stand: Min guard, From elevated surface           General transfer comment: min guard from elevated surface    Ambulation/Gait Ambulation/Gait assistance: Min assist Gait Distance (Feet): 4 Feet Assistive device: 1 person hand held assist Gait Pattern/deviations: Step-to pattern Gait velocity: decreased     General Gait Details: sidesteps and standing marching at bedside. MinA for balance with R lateral lean in standing. Completed on RA with spO2 drop to 84%. Cues for pursed lip breathing with no increase. Donned 3L O2   Stairs            Wheelchair Mobility    Modified Rankin (Stroke Patients Only)       Balance Overall balance assessment: Needs assistance, History of Falls Sitting-balance support: No upper extremity supported, Feet supported Sitting balance-[name]: Good  Postural control: Right lateral lean Standing balance support: Single extremity supported, During functional activity Standing balance-[name]:  Poor Standing balance comment: minA to maintain balance due to R lateral lean                             Pertinent Vitals/Pain Pain Assessment Pain Assessment: No/denies pain    Home Living Family/patient expects to be discharged to:: Private residence Living Arrangements: Spouse/significant other Available Help at Discharge: Family;Available 24 hours/day Type of Home: Mobile home Home Access: Stairs to enter Entrance Stairs-Rails: Right Entrance Stairs-Number of Steps: 1   Home Layout: One level Home Equipment: Tub bench;Summit (2 wheels);Other (comment) (3WW)      Prior Function Prior Level of Function : History of Falls (last six months);Needs assist             Mobility Comments: recently discharged from hospital, using RW for ambulation. Reports 8 falls since last admission 1 month ago ADLs Comments: does not drive, wife assists.     Hand Dominance        Extremity/Trunk Assessment   Upper Extremity Assessment Upper Extremity Assessment: Generalized weakness    Lower Extremity Assessment Lower Extremity Assessment: Generalized weakness    Cervical / Trunk Assessment Cervical / Trunk Assessment: Normal  Communication   Communication: No difficulties  Cognition Arousal/Alertness: Awake/alert Behavior During Therapy: WFL for tasks assessed/performed Overall Cognitive Status: Within Functional Limits for tasks assessed                                 General Comments: good insight into current deficits        General Comments General comments (skin integrity, edema, etc.): On 3L O2 on arrival, spO2 97%. Removed during mobility with drop to 84%. Cues for pursed lip breathing with no increase. Donned 3L O2 with increase to 94% and left on 3L O2 at end of session    Exercises     Assessment/Plan    PT Assessment Patient needs continued PT services  PT Problem List Decreased strength;Decreased activity  tolerance;Decreased balance;Decreased mobility;Decreased coordination;Cardiopulmonary status limiting activity       PT Treatment Interventions DME instruction;Gait training;Functional mobility training;Therapeutic activities;Therapeutic exercise;Balance training;Patient/family education    PT Goals (Current goals can be found in the Care Plan section)  Acute Rehab PT Goals Patient Stated Goal: to get stronger before going home PT Goal Formulation: With patient Time For Goal Achievement: 10/08/22 Potential to Achieve Goals: Good    Frequency Min 2X/week     Co-evaluation               AM-PAC PT "6 Clicks" Mobility  Outcome Measure Help needed turning from your back to your side while in a flat bed without using bedrails?: A Little Help needed moving from lying on your back to sitting on the side of a flat bed without using bedrails?: A Little Help needed moving to and from a bed to a chair (including a wheelchair)?: A Little Help needed standing up from a chair using your arms (e.g., wheelchair or bedside chair)?: A Little Help needed to walk in hospital room?: A Lot Help needed climbing 3-5 steps with a railing? : Total 6 Click Score: 15    End of Session Equipment Utilized During Treatment: Oxygen;Gait belt Activity Tolerance: Patient limited by fatigue Patient left: in bed;with call bell/phone  within reach Nurse Communication: Mobility status PT Visit Diagnosis: Muscle weakness (generalized) (M62.81);Unsteadiness on feet (R26.81);Repeated falls (R29.6);History of falling (Z91.81);Other abnormalities of gait and mobility (R26.89)    Time: 3606-7703 PT Time Calculation (min) (ACUTE ONLY): 24 min   Charges:   PT Evaluation $PT Eval Moderate Complexity: 1 Mod PT Treatments $Therapeutic Activity: 8-22 mins        Danny Zimny A. [name] PT, DPT [facility] Acute Rehabilitation Services   Jahquan Klugh A Lucresha Dismuke 09/24/2022, 12:00 PM

## 2022-09-24 NOTE — Assessment & Plan Note (Signed)
Continue spironolactone, diltiazem and HCTZ

## 2022-09-25 DIAGNOSIS — R531 Weakness: Secondary | ICD-10-CM | POA: Diagnosis not present

## 2022-09-25 LAB — BASIC METABOLIC PANEL
Anion gap: 10 (ref 5–15)
BUN: 48 mg/dL — ABNORMAL HIGH (ref 8–23)
CO2: 31 mmol/L (ref 22–32)
Calcium: 8.8 mg/dL — ABNORMAL LOW (ref 8.9–10.3)
Chloride: 96 mmol/L — ABNORMAL LOW (ref 98–111)
Creatinine, Ser: 2.06 mg/dL — ABNORMAL HIGH (ref 0.61–1.24)
GFR, Estimated: 33 mL/min — ABNORMAL LOW (ref >60)
Glucose, Bld: 101 mg/dL — ABNORMAL HIGH (ref 70–99)
Potassium: 3.7 mmol/L (ref 3.5–5.1)
Sodium: 137 mmol/L (ref 135–145)

## 2022-09-25 LAB — CBC
HCT: 41.8 % (ref 39.0–52.0)
Hemoglobin: 14.3 g/dL (ref 13.0–17.0)
MCH: 31 pg (ref 26.0–34.0)
MCHC: 34.2 g/dL (ref 30.0–36.0)
MCV: 90.5 fL (ref 80.0–100.0)
Platelets: 153 10*3/uL (ref 150–400)
RBC: 4.62 MIL/uL (ref 4.22–5.81)
RDW: 12.9 % (ref 11.5–15.5)
WBC: 9.1 10*3/uL (ref 4.0–10.5)
nRBC: 0 % (ref 0.0–0.2)

## 2022-09-25 LAB — MAGNESIUM: Magnesium: 2.2 mg/dL (ref 1.7–2.4)

## 2022-09-25 LAB — URINE CULTURE: Culture: NO GROWTH

## 2022-09-25 LAB — C DIFFICILE QUICK SCREEN W PCR REFLEX
C Diff antigen: NEGATIVE
C Diff interpretation: NOT DETECTED
C Diff toxin: NEGATIVE

## 2022-09-25 MED ORDER — SODIUM CHLORIDE 0.9 % IV SOLN: INTRAVENOUS | Status: DC

## 2022-09-25 MED ORDER — MOLNUPIRAVIR EUA 200MG CAPSULE
4.0000 | ORAL_CAPSULE | Freq: Two times a day (BID) | ORAL | Status: AC
  Administered 2022-09-25 – 2022-09-30 (×10): 800 mg via ORAL
  Filled 2022-09-25: qty 4

## 2022-09-25 NOTE — Care Management Obs Status (Addendum)
MEDICARE OBSERVATION STATUS NOTIFICATION   Patient Details  Name: Bradley Bowers MRN: [id number] Date of Birth: 09-20-1946   Medicare Observation Status Notification Given:  Yes    [name], [place] 09/25/2022, 2:01 PM

## 2022-09-25 NOTE — Progress Notes (Signed)
  PROGRESS NOTE    [name]  ZYY:[id number] DOB: Apr 06, 1946 DOA: 09/23/2022 PCP: Rusty Aus, MD  115A/115A-AA  LOS: 2 days   Brief hospital course:   Assessment & Plan: Bradley Bowers is a 76 y.o. male with medical history significant for CAD, HTN, GERD, peripheral neuropathy, parkinsonism, prior stroke, bipolar 1 disorder, COPD on 2L at baseline, hospitalized recently from 11/19 to 11/29 with a UTI presenting with abdominal pain and difficulty ambulating who was brought to the ED with a 2-day history of generalized weakness resulting in 2 falls.  He states he has fallen before but this was different because he was unable to get up on his own or remain standing due to the weakness.    * Generalized weakness and falls Multifactorial, related to COVID infection superimposed on chronic comorbid conditions including peripheral neuropathy, parkinsonism, restless leg syndrome --PT/OT, rec SNF rehab  COVID-19 virus infection Patient with 1 day of cough without hypoxia and chest x-ray clear Supportive care Airborne precautions  Chronic obstructive pulmonary disease (COPD) ([place]) Chronic hypoxemic respiratory failure on 2L O2 at baseline Not acutely exacerbated --cont Dulera and Spiriva --cont home 2L O2  History of TIA (transient ischemic attack) Continue aspirin and statin  Parkinsonian features Continue donepezil  Essential hypertension Continue spironolactone, diltiazem and HCTZ, lisinopril, torsemide  Coronary artery disease No active chest pain, troponin slightly elevated at 24.  EKG nonacute --cont ASA and statin  Bipolar affective disorder, current episode manic with psychotic symptoms (HCC) Continue Depakote, carbamazepine  AKI --Cr went from 1.64 to 2.06 this morning.  Likely due to reduced oral hydration. --MIVF'@75'$  for 12 hours   DVT prophylaxis: Lovenox SQ Code Status: Full code  Family Communication:  Level of care: Med-Surg Dispo:   The  patient is from: home Anticipated d/c is to: SNF rehab Anticipated d/c date is: whenever bed available   Subjective and Interval History:  Pt admitted to reduced oral intake.     Objective: Vitals:   09/24/22 2045 09/25/22 0503 09/25/22 0812 09/25/22 1647  BP: 108/61 110/61 (!) 103/55 109/72  Pulse: 75 88 65 70  Resp: '19 20 16 20  '$ Temp: 97.7 F (36.5 C) 97.7 F (36.5 C) 97.8 F (36.6 C) 99.1 F (37.3 C)  TempSrc: Oral Oral Oral   SpO2: 98% 97% 97% 99%  Weight:      Height:        Intake/Output Summary (Last 24 hours) at 09/25/2022 1855 Last data filed at 09/25/2022 1631 Gross per 24 hour  Intake 210 ml  Output 601 ml  Net -391 ml   Filed Weights   09/24/22 0316  Weight: 88.8 kg    Examination:   Constitutional: NAD, AAOx3 HEENT: conjunctivae and lids normal, EOMI CV: No cyanosis.   RESP: normal respiratory effort Neuro: II - XII grossly intact.   Psych: Normal mood and affect.  Appropriate judgement and reason   Data Reviewed: I have personally reviewed labs and imaging studies  Time spent: 35 minutes  Enzo Bi, MD [facility] If 7PM-7AM, please contact night-coverage 09/25/2022, 6:55 PM

## 2022-09-26 DIAGNOSIS — R531 Weakness: Secondary | ICD-10-CM | POA: Diagnosis not present

## 2022-09-26 LAB — CBC
HCT: 36.5 % — ABNORMAL LOW (ref 39.0–52.0)
Hemoglobin: 12.5 g/dL — ABNORMAL LOW (ref 13.0–17.0)
MCH: 30.5 pg (ref 26.0–34.0)
MCHC: 34.2 g/dL (ref 30.0–36.0)
MCV: 89 fL (ref 80.0–100.0)
Platelets: 152 10*3/uL (ref 150–400)
RBC: 4.1 MIL/uL — ABNORMAL LOW (ref 4.22–5.81)
RDW: 12.9 % (ref 11.5–15.5)
WBC: 7.8 10*3/uL (ref 4.0–10.5)
nRBC: 0 % (ref 0.0–0.2)

## 2022-09-26 LAB — BASIC METABOLIC PANEL
Anion gap: 10 (ref 5–15)
BUN: 54 mg/dL — ABNORMAL HIGH (ref 8–23)
CO2: 30 mmol/L (ref 22–32)
Calcium: 8.3 mg/dL — ABNORMAL LOW (ref 8.9–10.3)
Chloride: 96 mmol/L — ABNORMAL LOW (ref 98–111)
Creatinine, Ser: 2.11 mg/dL — ABNORMAL HIGH (ref 0.61–1.24)
GFR, Estimated: 32 mL/min — ABNORMAL LOW (ref >60)
Glucose, Bld: 99 mg/dL (ref 70–99)
Potassium: 3.6 mmol/L (ref 3.5–5.1)
Sodium: 136 mmol/L (ref 135–145)

## 2022-09-26 LAB — MAGNESIUM: Magnesium: 2.1 mg/dL (ref 1.7–2.4)

## 2022-09-26 MED ORDER — SODIUM CHLORIDE 0.9 % IV SOLN: INTRAVENOUS | Status: AC

## 2022-09-26 NOTE — NC FL2 (Addendum)
[place] LEVEL OF CARE FORM     IDENTIFICATION  Patient Name: Bradley Bowers Birthdate: [birth year] Sex: male Admission Date (Current Location): 09/23/2022  [facility] and [place] Number:  Engineering geologist and Address:  [facility], [address], [place], [place] [address]      Provider Number: [id number]  Attending Physician Name and Address:  Enzo Bi, MD  Relative Name and Phone Number:       Current Level of Care: Hospital Recommended Level of Care: [place] Prior Approval Number:    Date Approved/Denied:   PASRR Number: [id number]  Discharge Plan: SNF    Current Diagnoses: Patient Active Problem List   Diagnosis Date Noted   COVID-19 virus infection 09/24/2022   History of TIA (transient ischemic attack) 09/24/2022   Generalized weakness 78/67/6720   Acute metabolic encephalopathy 94/70/9628   Urinary tract infection in male 08/25/2022   AKI (acute kidney injury) ([place]) 08/25/2022   Parkinsonian features    CVA (cerebral vascular accident) ([place]) 01/31/2022   Weakness    Essential hypertension 01/30/2022   Peripheral neuropathy 01/30/2022   TIA (transient ischemic attack) 01/29/2022   Acute respiratory failure with hypoxia ([place]) 05/22/2021   COPD with acute exacerbation ([place]) 05/22/2021   Hyperlipemia 05/22/2021   Bipolar 1 disorder ([place]) 05/22/2021   Renal insufficiency 05/22/2021   Leg edema 05/22/2021   Coronary artery disease 09/24/2016   Glaucoma 09/24/2016   Bipolar affective disorder, current episode manic with psychotic symptoms ([place]) 09/23/2016   Chronic obstructive pulmonary disease (COPD) ([place]) 11/06/2015   Gastro-esophageal reflux disease without esophagitis 10/24/2014   Obstructive apnea 10/24/2014   Restless leg 10/24/2014    Orientation RESPIRATION BLADDER Height & Weight     Self  O2 (4 L O2) Incontinent Weight: 195 lb 12.8 oz (88.8 kg) Height:  '5\' 6"'$  (167.6 cm)   BEHAVIORAL SYMPTOMS/MOOD NEUROLOGICAL BOWEL NUTRITION STATUS      Continent Diet (Heart Healthy)  AMBULATORY STATUS COMMUNICATION OF NEEDS Skin   Extensive Assist Verbally Skin abrasions, Other (Comment) (appropriate for ethnicity, flaky/dry, abrasion; ecchymosis)                       Personal Care Assistance Level of Assistance  Bathing, Feeding, Dressing Bathing Assistance: Limited assistance Feeding assistance: Limited assistance Dressing Assistance: Limited assistance     Functional Limitations Info  Speech, Hearing, Sight Sight Info: Adequate Hearing Info: Adequate Speech Info: Adequate    SPECIAL CARE FACTORS FREQUENCY  OT (By licensed OT), PT (By licensed PT)     PT Frequency: 5x a week OT Frequency: 5x a week            Contractures Contractures Info: Not present    Additional Factors Info  Code Status Code Status Info: Full             Current Medications (09/26/2022):  This is the current hospital active medication list Current Facility-Administered Medications  Medication Dose Route Frequency Provider Last Rate Last Admin   0.9 %  sodium chloride infusion   Intravenous Continuous Enzo Bi, MD 50 mL/hr at 09/26/22 1522 Infusion Verify at 09/26/22 1522   acetaminophen (TYLENOL) tablet 650 mg  650 mg Oral Q6H PRN Athena Masse, MD       albuterol (VENTOLIN HFA) 108 (90 Base) MCG/ACT inhaler 1-2 puff  1-2 puff Inhalation Q4H PRN Athena Masse, MD       aspirin EC tablet 81 mg  81 mg Oral Daily Athena Masse, MD   81 mg at 09/26/22 0935   carbamazepine (TEGRETOL) tablet 200 mg  200 mg Oral BID Judd Gaudier V, MD   200 mg at 09/26/22 0935   chlorpheniramine-HYDROcodone (TUSSIONEX) 10-8 MG/5ML suspension 5 mL  5 mL Oral Q12H PRN Athena Masse, MD   5 mL at 09/25/22 2244   diltiazem (CARDIZEM CD) 24 hr capsule 180 mg  180 mg Oral Daily Judd Gaudier V, MD   180 mg at 09/26/22 0935   divalproex (DEPAKOTE ER) 24 hr tablet 500 mg  500 mg Oral Daily  Judd Gaudier V, MD   500 mg at 09/26/22 0936   donepezil (ARICEPT) tablet 10 mg  10 mg Oral QHS Judd Gaudier V, MD   10 mg at 09/25/22 2241   enoxaparin (LOVENOX) injection 45 mg  0.5 mg/kg Subcutaneous Q24H Judd Gaudier V, MD   45 mg at 09/26/22 0934   guaiFENesin-dextromethorphan (ROBITUSSIN DM) 100-10 MG/5ML syrup 10 mL  10 mL Oral Q4H PRN Athena Masse, MD       molnupiravir EUA (LAGEVRIO) capsule 800 mg  4 capsule Oral BID Enzo Bi, MD   800 mg at 09/26/22 0941   mometasone-formoterol (DULERA) 100-5 MCG/ACT inhaler 2 puff  2 puff Inhalation BID Athena Masse, MD   2 puff at 09/26/22 0943   montelukast (SINGULAIR) tablet 10 mg  10 mg Oral QHS Judd Gaudier V, MD   10 mg at 09/25/22 2242   ondansetron (ZOFRAN) tablet 4 mg  4 mg Oral Q6H PRN Athena Masse, MD       Or   ondansetron [facility]) injection 4 mg  4 mg Intravenous Q6H PRN Athena Masse, MD       pantoprazole (PROTONIX) EC tablet 40 mg  40 mg Oral BID Judd Gaudier V, MD   40 mg at 09/26/22 0934   timolol (TIMOPTIC) 0.5 % ophthalmic solution 1 drop  1 drop Both Eyes BID Athena Masse, MD   1 drop at 09/26/22 0944   tiotropium [facility]) inhalation capsule ([facility] use ONLY) 18 mcg  18 mcg Inhalation Daily Athena Masse, MD   18 mcg at 09/26/22 0737     Discharge Medications: Please see discharge summary for a list of discharge medications.  Relevant Imaging Results:  Relevant Lab Results:   Additional Information SSN: [id number]  [name], LCSWA

## 2022-09-26 NOTE — Progress Notes (Signed)
  PROGRESS NOTE    Bradley Bowers  EHM:[id number] DOB: 05/30/1946 DOA: 09/23/2022 PCP: Rusty Aus, MD  115A/115A-AA  LOS: 3 days   Brief hospital course:   Assessment & Plan: Bradley Bowers is a 76 y.o. male with medical history significant for CAD, HTN, GERD, peripheral neuropathy, parkinsonism, prior stroke, bipolar 1 disorder, COPD on 2L at baseline, hospitalized recently from 11/19 to 11/29 with a UTI presenting with abdominal pain and difficulty ambulating who was brought to the ED with a 2-day history of generalized weakness resulting in 2 falls.  He states he has fallen before but this was different because he was unable to get up on his own or remain standing due to the weakness.    * Generalized weakness and falls --related to COVID infection and/or deconditioning.   --PT/OT --SNF rehab  COVID-19 virus infection Patient with 1 day of cough without hypoxia and chest x-ray clear Supportive care Airborne precautions  Chronic obstructive pulmonary disease (COPD) ([place]) Chronic hypoxemic respiratory failure on 2L O2 at baseline Not acutely exacerbated --cont Dulera and Spiriva --cont home 2L O2  History of TIA (transient ischemic attack) Continue aspirin and statin  Parkinsonian features Continue donepezil  Essential hypertension --on home spironolactone, diltiazem and HCTZ, lisinopril, torsemide --cont home regimen  Coronary artery disease No active chest pain, troponin slightly elevated at 24.  EKG nonacute --cont ASA and statin  Bipolar affective disorder, current episode manic with psychotic symptoms (HCC) Continue Depakote, carbamazepine  AKI --Cr went from 1.64 to 2.06.  Likely due to reduced oral hydration. --cont MIVF'@50'$  for 20 more hours   DVT prophylaxis: Lovenox SQ Code Status: Full code  Family Communication: wife updated on the phone today Level of care: Med-Surg Dispo:   The patient is from: home Anticipated d/c is to: SNF  rehab Anticipated d/c date is: whenever bed available   Subjective and Interval History:  No new issue.  Cr hasn't improved but stable.   Objective: Vitals:   09/25/22 2226 09/26/22 0551 09/26/22 0758 09/26/22 1613  BP: 105/62 (!) 92/46 118/81 (!) 141/65  Pulse: 72 (!) 53 (!) 53 70  Resp:  18 16   Temp:  (!) 97.5 F (36.4 C) 97.7 F (36.5 C) 98.1 F (36.7 C)  TempSrc:   Oral Oral  SpO2: 99% 97% 97% 96%  Weight:      Height:        Intake/Output Summary (Last 24 hours) at 09/26/2022 1842 Last data filed at 09/26/2022 1522 Gross per 24 hour  Intake 38.57 ml  Output 700 ml  Net -661.43 ml   Filed Weights   09/24/22 0316  Weight: 88.8 kg    Examination:   Constitutional: NAD, AAOx3 HEENT: conjunctivae and lids normal, EOMI CV: No cyanosis.   RESP: normal respiratory effort, on RA Neuro: II - XII grossly intact.   Psych: Normal mood and affect.  Appropriate judgement and reason   Data Reviewed: I have personally reviewed labs and imaging studies  Time spent: 35 minutes  Enzo Bi, MD [facility] If 7PM-7AM, please contact night-coverage 09/26/2022, 6:42 PM

## 2022-09-26 NOTE — Progress Notes (Signed)
Physical Therapy Treatment Patient Details Name: Bradley Bowers MRN: [id number] DOB: Apr 18, 1946 Today's Date: 09/26/2022   History of Present Illness 76 y/o male presented to ED on 09/23/22 for weakness, falls, and "feeling sick". Found to be COVID+. PMH: bipolar 1 disorder, CVA, hypertension, Parkinson's, hyperlipidemia, COPD, peripheral neuropathy    PT Comments    Patient supine in bed on arrival and agreeable to therapy session after encouragement to participate. Completed bed mobility with supervision. Required min guard for sit to stand from low bed surface with cues for hand placement. Initiated gait training with fwd/bwd steps but when encouraged to ambulate in room, patient declines. Required minA for short mobility with RW for balance. Limited by potential fear of falling due to hx of numerous falls. Continue to recommend SNF for ongoing Physical Therapy.       Recommendations for follow up therapy are one component of a multi-disciplinary discharge planning process, led by the attending physician.  Recommendations may be updated based on patient status, additional functional criteria and insurance authorization.  Follow Up Recommendations  Skilled nursing-short term rehab (<3 hours/day) Can patient physically be transported by private vehicle: Yes   Assistance Recommended at Discharge Frequent or constant Supervision/Assistance  Patient can return home with the following A little help with bathing/dressing/bathroom;Assistance with cooking/housework;Assist for transportation;Help with stairs or ramp for entrance;A little help with walking and/or transfers   Equipment Recommendations  None recommended by PT    Recommendations for Other Services       Precautions / Restrictions Precautions Precautions: Fall Precaution Comments: watch O2 Restrictions Weight Bearing Restrictions: No     Mobility  Bed Mobility Overal bed mobility: Needs Assistance Bed Mobility: Supine  to Sit     Supine to sit: Supervision, HOB elevated     General bed mobility comments: supervision for safety    Transfers Overall transfer level: Needs assistance Equipment used: Rolling Bradley Bowers (2 wheels) Transfers: Sit to/from Stand Sit to Stand: Min guard           General transfer comment: min guard from low bed surface. Cues for hand placement    Ambulation/Gait Ambulation/Gait assistance: Min assist Gait Distance (Feet): 5 Feet Assistive device: Rolling Bradley Bowers (2 wheels) Gait Pattern/deviations: Step-to pattern, Decreased stride length Gait velocity: decreased     General Gait Details: took steps forward and backward at EOB then patient declining ambulating in room due to feeling unsteady and fear of falling. Assist for balance. Able to take pivotal steps towards recliner with RW   Stairs             Wheelchair Mobility    Modified Rankin (Stroke Patients Only)       Balance Overall balance assessment: Needs assistance, History of Falls Sitting-balance support: No upper extremity supported, Feet supported Sitting balance-Bradley Bowers: Good     Standing balance support: Bilateral upper extremity supported, Reliant on assistive device for balance Standing balance-Bradley Bowers: Poor                              Cognition Arousal/Alertness: Awake/alert Behavior During Therapy: WFL for tasks assessed/performed Overall Cognitive Status: Within Functional Limits for tasks assessed                                 General Comments: encouragement required to participate        Exercises  General Comments General comments (skin integrity, edema, etc.): On 3L O2, spO2 >90% throughout      Pertinent Vitals/Pain Pain Assessment Pain Assessment: No/denies pain    Home Living                          Prior Function            PT Goals (current goals can now be found in the care plan section) Acute Rehab  PT Goals PT Goal Formulation: With patient Time For Goal Achievement: 10/08/22 Potential to Achieve Goals: Good Progress towards PT goals: Progressing toward goals    Frequency    Min 2X/week      PT Plan Current plan remains appropriate    Co-evaluation              AM-PAC PT "6 Clicks" Mobility   Outcome Measure  Help needed turning from your back to your side while in a flat bed without using bedrails?: A Little Help needed moving from lying on your back to sitting on the side of a flat bed without using bedrails?: A Little Help needed moving to and from a bed to a chair (including a wheelchair)?: A Little Help needed standing up from a chair using your arms (e.g., wheelchair or bedside chair)?: A Little Help needed to walk in hospital room?: A Lot Help needed climbing 3-5 steps with a railing? : Total 6 Click Score: 15    End of Session Equipment Utilized During Treatment: Oxygen;Gait belt Activity Tolerance: Patient limited by fatigue Patient left: in chair;with call bell/phone within reach Nurse Communication: Mobility status PT Visit Diagnosis: Muscle weakness (generalized) (M62.81);Unsteadiness on feet (R26.81);Repeated falls (R29.6);History of falling (Z91.81);Other abnormalities of gait and mobility (R26.89)     Time: 6579-0383 PT Time Calculation (min) (ACUTE ONLY): 28 min  Charges:  $Therapeutic Activity: 23-37 mins                     Bradley Bowers A. Bradley Bowers PT, DPT [facility] - Acute Rehabilitation Services    Bradley Bowers A Bradley Bowers 09/26/2022, 4:13 PM

## 2022-09-26 NOTE — Care Management Important Message (Signed)
Important Message  Patient Details  Name: Bradley Bowers MRN: [id number] Date of Birth: [birth year]   Medicare Important Message Given:  N/A - LOS <3 / Initial given by admissions     [name] 09/26/2022, 9:01 AM

## 2022-09-26 NOTE — TOC Progression Note (Addendum)
Transition of Care [facility]) - Progression Note    Patient Details  Name: [name] MRN: [id number] Date of Birth: 02-23-1946  Transition of Care [facility]) CM/SW [place], [place] Phone Number: 09/26/2022, 11:28 AM  Clinical Narrative:     TOC spoke to the PT and the patient needs a workup for SNF. Patient is currently in an isolation room.  TOC calling patient's room phone at [phone].  Patient's family is also requesting a phone call with updates.  Referrals sent to [facility] area SNFs.   Barriers to Discharge: Continued Medical Work up  Expected Discharge Plan and Services   Discharge Planning Services: [place] Acute Care Choice: [place] arrangements for the past 2 months: Single Family Home                 DME Arranged: N/A DME Agency: NA       HH Arranged: NA [place] Agency: NA         Social Determinants of Health (SDOH) Interventions SDOH Screenings   Food Insecurity: No Food Insecurity (09/24/2022)  Housing: Low Risk  (09/24/2022)  Transportation Needs: No Transportation Needs (09/24/2022)  Utilities: Not At Risk (09/24/2022)  Alcohol Screen: Low Risk  (08/16/2017)  Depression (PHQ2-9): Low Risk  (04/05/2021)  Tobacco Use: Medium Risk (09/23/2022)    Readmission Risk Interventions     No data to display

## 2022-09-27 DIAGNOSIS — R531 Weakness: Secondary | ICD-10-CM | POA: Diagnosis not present

## 2022-09-27 LAB — BASIC METABOLIC PANEL
Anion gap: 6 (ref 5–15)
BUN: 36 mg/dL — ABNORMAL HIGH (ref 8–23)
CO2: 32 mmol/L (ref 22–32)
Calcium: 8.3 mg/dL — ABNORMAL LOW (ref 8.9–10.3)
Chloride: 98 mmol/L (ref 98–111)
Creatinine, Ser: 1.2 mg/dL (ref 0.61–1.24)
GFR, Estimated: >60 mL/min (ref >60)
Glucose, Bld: 94 mg/dL (ref 70–99)
Potassium: 4.1 mmol/L (ref 3.5–5.1)
Sodium: 136 mmol/L (ref 135–145)

## 2022-09-27 LAB — CBC
HCT: 36.2 % — ABNORMAL LOW (ref 39.0–52.0)
Hemoglobin: 12.4 g/dL — ABNORMAL LOW (ref 13.0–17.0)
MCH: 30.3 pg (ref 26.0–34.0)
MCHC: 34.3 g/dL (ref 30.0–36.0)
MCV: 88.5 fL (ref 80.0–100.0)
Platelets: 147 10*3/uL — ABNORMAL LOW (ref 150–400)
RBC: 4.09 MIL/uL — ABNORMAL LOW (ref 4.22–5.81)
RDW: 12.7 % (ref 11.5–15.5)
WBC: 6.8 10*3/uL (ref 4.0–10.5)
nRBC: 0 % (ref 0.0–0.2)

## 2022-09-27 LAB — MAGNESIUM: Magnesium: 2.1 mg/dL (ref 1.7–2.4)

## 2022-09-27 MED ORDER — ENSURE ENLIVE PO LIQD
237.0000 mL | Freq: Two times a day (BID) | ORAL | Status: DC
  Administered 2022-09-27 – 2022-10-08 (×19): 237 mL via ORAL

## 2022-09-27 NOTE — Progress Notes (Signed)
  PROGRESS NOTE    DRAGON THRUSH  AST:[id number] DOB: 02-10-1946 DOA: 09/23/2022 PCP: Rusty Aus, MD  115A/115A-AA  LOS: 4 days   Brief hospital course:   Assessment & Plan: Bradley Bowers is a 76 y.o. male with medical history significant for CAD, HTN, GERD, peripheral neuropathy, parkinsonism, prior stroke, bipolar 1 disorder, COPD on 2L at baseline, hospitalized recently from 11/19 to 11/29 with a UTI presenting with abdominal pain and difficulty ambulating who was brought to the ED with a 2-day history of generalized weakness resulting in 2 falls.  He states he has fallen before but this was different because he was unable to get up on his own or remain standing due to the weakness.    * Generalized weakness and falls --related to COVID infection and/or deconditioning.   --PT/OT --SNF rehab  COVID-19 virus infection Patient with 1 day of cough without hypoxia and chest x-ray clear Supportive care Airborne precautions  Chronic obstructive pulmonary disease (COPD) ([place]) Chronic hypoxemic respiratory failure on 2L O2 at baseline Not acutely exacerbated --cont Dulera and Spiriva --cont home 2L O2  History of TIA (transient ischemic attack) Continue aspirin and statin  Parkinsonian features Continue donepezil  Essential hypertension --on home spironolactone, diltiazem and HCTZ, lisinopril, torsemide --cont home regimen  Coronary artery disease No active chest pain, troponin slightly elevated at 24.  EKG nonacute --cont ASA and statin  Bipolar affective disorder, current episode manic with psychotic symptoms (HCC) Continue Depakote, carbamazepine  AKI --Cr went from 1.64 to 2.06.  Likely due to reduced oral hydration.  Cr back to baseline 1.2 today after IVF. --oral hydration now   DVT prophylaxis: Lovenox SQ Code Status: Full code  Family Communication:  Level of care: Med-Surg Dispo:   The patient is from: home Anticipated d/c is to: SNF  rehab Anticipated d/c date is: whenever bed available   Subjective and Interval History:  No new complaint today.  Cr improved with IVF.   Objective: Vitals:   09/26/22 2118 09/27/22 0536 09/27/22 0814 09/27/22 1548  BP: 127/74 127/70 129/63 137/67  Pulse: 79 69 81 82  Resp: '20 20 19 20  '$ Temp: 99.5 F (37.5 C) (!) 97.3 F (36.3 C) 98 F (36.7 C) 98.3 F (36.8 C)  TempSrc: Oral Oral Oral Axillary  SpO2: 96% 95% 91% 94%  Weight:      Height:        Intake/Output Summary (Last 24 hours) at 09/27/2022 1723 Last data filed at 09/27/2022 1341 Gross per 24 hour  Intake 738.13 ml  Output 1400 ml  Net -661.87 ml   Filed Weights   09/24/22 0316  Weight: 88.8 kg    Examination:   Constitutional: NAD, AAOx3 HEENT: conjunctivae and lids normal, EOMI CV: No cyanosis.   RESP: normal respiratory effort, on 3L Neuro: II - XII grossly intact.   Psych: Normal mood and affect.  Appropriate judgement and reason   Data Reviewed: I have personally reviewed labs and imaging studies  Time spent: 25 minutes  Enzo Bi, MD [facility] If 7PM-7AM, please contact night-coverage 09/27/2022, 5:23 PM

## 2022-09-27 NOTE — TOC CM/SW Note (Signed)
[place] Note   Patient Details  Name: Bradley Bowers Date of Birth: [birth year]   Transition of Care [facility]) CM/SW Contact:    [name], [place] Phone Number: 09/27/2022, 1:58 PM  To Whom It May Concern:  Please be advised that this patient will require a short-term nursing home stay - anticipated 30 days or less for rehabilitation and strengthening.   The plan is for return home.

## 2022-09-27 NOTE — TOC Progression Note (Addendum)
Transition of Care [facility]) - Progression Note    Patient Details  Name: Bradley Bowers MRN: [id number] Date of Birth: 1945/10/29  Transition of Care [facility]) CM/SW [place], [place] Phone Number: 09/27/2022, 2:15 PM  Clinical Narrative:     TOC uploaded the level II PASSR to [place].  PASSR is [id number]  Expected Discharge Plan: [place] Barriers to Discharge: Continued Medical Work up  Expected Discharge Plan and Services   Discharge Planning Services: CM Consult Post Acute Care Choice: [place] Living arrangements for the past 2 months: Single Family Home                 DME Arranged: N/A DME Agency: NA       HH Arranged: NA HH Agency: NA         Social Determinants of Health (SDOH) Interventions SDOH Screenings   Food Insecurity: No Food Insecurity (09/24/2022)  Housing: Low Risk  (09/24/2022)  Transportation Needs: No Transportation Needs (09/24/2022)  Utilities: Not At Risk (09/24/2022)  Alcohol Screen: Low Risk  (08/16/2017)  Depression (PHQ2-9): Low Risk  (04/05/2021)  Tobacco Use: Medium Risk (09/23/2022)    Readmission Risk Interventions     No data to display

## 2022-09-28 DIAGNOSIS — R531 Weakness: Secondary | ICD-10-CM | POA: Diagnosis not present

## 2022-09-28 NOTE — Progress Notes (Signed)
  PROGRESS NOTE    Bradley Bowers  HRC:[id number] DOB: March 17, 1946 DOA: 09/23/2022 PCP: Rusty Aus, MD  115A/115A-AA  LOS: 5 days   Brief hospital course:   Assessment & Plan: Bradley Bowers is a 76 y.o. male with medical history significant for CAD, HTN, GERD, peripheral neuropathy, parkinsonism, prior stroke, bipolar 1 disorder, COPD on 2L at baseline, hospitalized recently from 11/19 to 11/29 with a UTI presenting with abdominal pain and difficulty ambulating who was brought to the ED with a 2-day history of generalized weakness resulting in 2 falls.  He states he has fallen before but this was different because he was unable to get up on his own or remain standing due to the weakness.    * Generalized weakness and falls --related to COVID infection and/or deconditioning.   --PT/OT --SNF rehab  COVID-19 virus infection Patient with 1 day of cough without hypoxia and chest x-ray clear Supportive care Airborne precautions  Chronic obstructive pulmonary disease (COPD) ([place]) Chronic hypoxemic respiratory failure on 2L O2 at baseline Not acutely exacerbated --cont Dulera and Spiriva --cont home 2L O2  History of TIA (transient ischemic attack) Continue aspirin and statin  Parkinsonian features Continue donepezil  Essential hypertension --on home spironolactone, diltiazem and HCTZ, lisinopril, torsemide --cont home regimen  Coronary artery disease No active chest pain, troponin slightly elevated at 24.  EKG nonacute --cont ASA and statin  Bipolar affective disorder, current episode manic with psychotic symptoms (HCC) Continue Depakote, carbamazepine  AKI --Cr went from 1.64 to 2.06.  Likely due to reduced oral hydration.  Cr back to baseline 1.2 after IVF. --oral hydration now   DVT prophylaxis: Lovenox SQ Code Status: Full code  Family Communication:  Level of care: Med-Surg Dispo:   The patient is from: home Anticipated d/c is to: SNF  rehab Anticipated d/c date is: whenever bed available   Subjective and Interval History:  Pt reported feeling more congested today.   Objective: Vitals:   09/27/22 1937 09/28/22 0615 09/28/22 0821 09/28/22 1539  BP: (!) 158/96 133/81 (!) 142/65 (!) 156/70  Pulse: 88 71 75 90  Resp: '19 16 19 20  '$ Temp: 98.4 F (36.9 C) 98.7 F (37.1 C)  98.9 F (37.2 C)  TempSrc: Oral Oral    SpO2:  97% 93% 97%  Weight:      Height:        Intake/Output Summary (Last 24 hours) at 09/28/2022 1738 Last data filed at 09/27/2022 2206 Gross per 24 hour  Intake 200 ml  Output --  Net 200 ml   Filed Weights   09/24/22 0316  Weight: 88.8 kg    Examination:   Constitutional: NAD, AAOx3 HEENT: conjunctivae and lids normal, EOMI CV: No cyanosis.   RESP: normal respiratory effort, on 2L Neuro: II - XII grossly intact.   Psych: Normal mood and affect.  Appropriate judgement and reason   Data Reviewed: I have personally reviewed labs and imaging studies  Time spent: 25 minutes  Enzo Bi, MD [facility] If 7PM-7AM, please contact night-coverage 09/28/2022, 5:38 PM

## 2022-09-28 NOTE — TOC Progression Note (Signed)
Transition of Care [facility]) - Progression Note    Patient Details  Name: Bradley Bowers MRN: [id number] Date of Birth: Jan 05, 1946  [facility]) CM/SW Contact  Gerilyn Pilgrim, LCSW Phone Number: 09/28/2022, 3:06 PM  Clinical Narrative:   SW spoke with wife about accepting SNF's. [facility] states [facility] would [facility]. [facility]. Pt likely will not be able to admit until 10 days post his covid positive test on 12/18 but will call facility to confirm.     Expected Discharge Plan: [place] Barriers to Discharge: Continued Medical Work up  Expected Discharge Plan and Services   Discharge Planning Services: CM Consult Post Acute Care Choice: [place] Living arrangements for the past 2 months: Single Family Home                 DME Arranged: N/A DME Agency: NA       HH Arranged: NA HH Agency: NA         Social Determinants of Health (SDOH) Interventions SDOH Screenings   Food Insecurity: No Food Insecurity (09/24/2022)  Housing: Low Risk  (09/24/2022)  Transportation Needs: No Transportation Needs (09/24/2022)  Utilities: Not At Risk (09/24/2022)  Alcohol Screen: Low Risk  (08/16/2017)  Depression (PHQ2-9): Low Risk  (04/05/2021)  Tobacco Use: Medium Risk (09/23/2022)    Readmission Risk Interventions     No data to display

## 2022-09-29 DIAGNOSIS — R531 Weakness: Secondary | ICD-10-CM | POA: Diagnosis not present

## 2022-09-29 MED ORDER — ATORVASTATIN CALCIUM 20 MG PO TABS
80.0000 mg | ORAL_TABLET | Freq: Every day | ORAL | Status: DC
  Administered 2022-09-29 – 2022-10-08 (×10): 80 mg via ORAL
  Filled 2022-09-29 (×10): qty 4

## 2022-09-29 NOTE — Progress Notes (Signed)
  PROGRESS NOTE    ACHERON SUGG  HQI:[id number] DOB: 06-25-1946 DOA: 09/23/2022 PCP: Rusty Aus, MD  115A/115A-AA  LOS: 6 days   Brief hospital course:   Assessment & Plan: MAKELL CYR is a 76 y.o. male with medical history significant for CAD, HTN, GERD, peripheral neuropathy, parkinsonism, prior stroke, bipolar 1 disorder, COPD on 2L at baseline, hospitalized recently from 11/19 to 11/29 with a UTI presenting with abdominal pain and difficulty ambulating who was brought to the ED with a 2-day history of generalized weakness resulting in 2 falls.  He states he has fallen before but this was different because he was unable to get up on his own or remain standing due to the weakness.    * Generalized weakness and falls --related to COVID infection and/or deconditioning.   --PT/OT --SNF rehab  COVID-19 virus infection Patient with 1 day of cough without hypoxia and chest x-ray clear Supportive care Airborne precautions  Chronic obstructive pulmonary disease (COPD) ([place]) Chronic hypoxemic respiratory failure on 2L O2 at baseline Not acutely exacerbated --cont Dulera and Spiriva --cont home 2L O2  History of TIA (transient ischemic attack) Continue aspirin and statin  Parkinsonian features Continue donepezil  Essential hypertension --on home spironolactone, diltiazem and HCTZ, lisinopril, torsemide --cont home regimen  Coronary artery disease No active chest pain, troponin slightly elevated at 24.  EKG nonacute --cont ASA and statin  Bipolar affective disorder, current episode manic with psychotic symptoms (HCC) Continue Depakote, carbamazepine  AKI --Cr went from 1.64 to 2.06.  Likely due to reduced oral hydration.  Cr back to baseline 1.2 after IVF. --oral hydration now   DVT prophylaxis: Lovenox SQ Code Status: Full code  Family Communication:  Level of care: Med-Surg Dispo:   The patient is from: home Anticipated d/c is to: SNF  rehab Anticipated d/c date is: whenever bed available   Subjective and Interval History:  No new complaint today.   Objective: Vitals:   09/28/22 1539 09/28/22 2128 09/29/22 0809 09/29/22 1547  BP: (!) 156/70 (!) 161/71 (!) 146/84 127/66  Pulse: 90 85 72 70  Resp: '20 19 20 20  '$ Temp: 98.9 F (37.2 C) 98.7 F (37.1 C) 98.5 F (36.9 C) 98.2 F (36.8 C)  TempSrc:  Oral    SpO2: 97% 98% 98% 100%  Weight:      Height:        Intake/Output Summary (Last 24 hours) at 09/29/2022 1733 Last data filed at 09/28/2022 2300 Gross per 24 hour  Intake 200 ml  Output 600 ml  Net -400 ml   Filed Weights   09/24/22 0316  Weight: 88.8 kg    Examination:   Constitutional: NAD, AAOx3 HEENT: conjunctivae and lids normal, EOMI CV: No cyanosis.   RESP: normal respiratory effort, on 2L Neuro: II - XII grossly intact.   Psych: Normal mood and affect.  Appropriate judgement and reason   Data Reviewed: I have personally reviewed labs and imaging studies  Time spent: 25 minutes  Enzo Bi, MD [facility] If 7PM-7AM, please contact night-coverage 09/29/2022, 5:33 PM

## 2022-09-29 NOTE — Progress Notes (Signed)
Physical Therapy Treatment Patient Details Name: Bradley Bowers MRN: [id number] DOB: [birth year] Today's Date: 09/29/2022   History of Present Illness 76 y/o male presented to ED on 09/23/22 for weakness, falls, and "feeling sick". Found to be COVID+. PMH: bipolar 1 disorder, CVA, hypertension, Parkinson's, hyperlipidemia, COPD, peripheral neuropathy    PT Comments    Pt in bed.  Stated he has been up earlier today but willing to walk.  He is able to get to EOB with ming guard.  Stand and walk 20' x 2 with RW and min guard. Short shuffling steps but overall does fairly well.  Fatigued with gait and does require a seated rest on bed before second walk.  Opts to return to bed.  Discussed importance of continued OOB and gait daily with staff.  Voiced understanding.    Pt stated he is not at his baseline and wishes to go to rehab upon discharge.  If pt can make daily progress in mobility it may be reasonable to transition back home vs SNF.  Will continue to encourage mobility.   Recommendations for follow up therapy are one component of a multi-disciplinary discharge planning process, led by the attending physician.  Recommendations may be updated based on patient status, additional functional criteria and insurance authorization.  Follow Up Recommendations  Skilled nursing-short term rehab (<3 hours/day)     Assistance Recommended at Discharge Frequent or constant Supervision/Assistance  Patient can return home with the following A little help with bathing/dressing/bathroom;Assistance with cooking/housework;Assist for transportation;Help with stairs or ramp for entrance;A little help with walking and/or transfers   Equipment Recommendations  None recommended by PT    Recommendations for Other Services       Precautions / Restrictions Precautions Precautions: Fall Precaution Comments: watch O2 Restrictions Weight Bearing Restrictions: No     Mobility  Bed Mobility Overal bed  mobility: Needs Assistance Bed Mobility: Supine to Sit, Sit to Supine     Supine to sit: Min guard Sit to supine: Min guard        Transfers Overall transfer level: Needs assistance Equipment used: Rolling walker (2 wheels) Transfers: Sit to/from Stand Sit to Stand: Min guard                Ambulation/Gait Ambulation/Gait assistance: Herbalist (Feet): 20 Feet Assistive device: Rolling walker (2 wheels) Gait Pattern/deviations: Step-through pattern Gait velocity: decreased     General Gait Details: 20' x 2   Stairs             Wheelchair Mobility    Modified Rankin (Stroke Patients Only)       Balance Overall balance assessment: Needs assistance, History of Falls Sitting-balance support: No upper extremity supported, Feet supported Sitting balance-[name]: Good     Standing balance support: Bilateral upper extremity supported, Reliant on assistive device for balance Standing balance-[name]: Poor Standing balance comment: +1 for mobility.  hands on assist but overall does well.                            Cognition Arousal/Alertness: Awake/alert Behavior During Therapy: WFL for tasks assessed/performed Overall Cognitive Status: Within Functional Limits for tasks assessed                                          Exercises  General Comments        Pertinent Vitals/Pain Pain Assessment Pain Assessment: No/denies pain    Home Living                          Prior Function            PT Goals (current goals can now be found in the care plan section) Progress towards PT goals: Progressing toward goals    Frequency    Min 2X/week      PT Plan Current plan remains appropriate    Co-evaluation              AM-PAC PT "6 Clicks" Mobility   Outcome Measure  Help needed turning from your back to your side while in a flat bed without using bedrails?: A  Little Help needed moving from lying on your back to sitting on the side of a flat bed without using bedrails?: A Little Help needed moving to and from a bed to a chair (including a wheelchair)?: A Little Help needed standing up from a chair using your arms (e.g., wheelchair or bedside chair)?: A Little Help needed to walk in hospital room?: A Little Help needed climbing 3-5 steps with a railing? : A Lot 6 Click Score: 17    End of Session Equipment Utilized During Treatment: Gait belt;Oxygen Activity Tolerance: Patient tolerated treatment well;Patient limited by fatigue Patient left: in bed;with call bell/phone within reach;with bed alarm set Nurse Communication: Mobility status PT Visit Diagnosis: Muscle weakness (generalized) (M62.81);Unsteadiness on feet (R26.81);Repeated falls (R29.6);History of falling (Z91.81);Other abnormalities of gait and mobility (R26.89)     Time: 1610-9604 PT Time Calculation (min) (ACUTE ONLY): 19 min  Charges:  $Gait Training: 8-22 mins                   Chesley Noon, PTA 09/29/22, 3:37 PM

## 2022-09-30 DIAGNOSIS — R531 Weakness: Secondary | ICD-10-CM | POA: Diagnosis not present

## 2022-09-30 NOTE — Progress Notes (Signed)
  PROGRESS NOTE    Bradley Bowers  [id number] DOB: 03/03/1946 DOA: 09/23/2022 PCP: Rusty Aus, MD  115A/115A-AA  LOS: 7 days   Brief hospital course:   Assessment & Plan: Bradley Bowers is a 76 y.o. male with medical history significant for CAD, HTN, GERD, peripheral neuropathy, parkinsonism, prior stroke, bipolar 1 disorder, COPD on 2L at baseline, hospitalized recently from 11/19 to 11/29 with a UTI presenting with abdominal pain and difficulty ambulating who was brought to the ED with a 2-day history of generalized weakness resulting in 2 falls.  He states he has fallen before but this was different because he was unable to get up on his own or remain standing due to the weakness.    * Generalized weakness and falls --related to COVID infection and/or deconditioning.   --PT/OT --SNF rehab  COVID-19 virus infection Patient with 1 day of cough without hypoxia and chest x-ray clear Supportive care Airborne precautions  Chronic obstructive pulmonary disease (COPD) ([place]) Chronic hypoxemic respiratory failure on 2L O2 at baseline Not acutely exacerbated --cont Dulera and Spiriva --cont home 2L O2  History of TIA (transient ischemic attack) Continue aspirin and statin  Parkinsonian features Continue donepezil  Essential hypertension --on home spironolactone, diltiazem and HCTZ, lisinopril, torsemide --cont home regimen  Coronary artery disease No active chest pain, troponin slightly elevated at 24.  EKG nonacute --cont ASA and statin  Bipolar affective disorder, current episode manic with psychotic symptoms (HCC) Continue Depakote, carbamazepine  AKI --Cr went from 1.64 to 2.06.  Likely due to reduced oral hydration.  Cr back to baseline 1.2 after IVF. --oral hydration now   DVT prophylaxis: Lovenox SQ Code Status: Full code  Family Communication:  Level of care: Med-Surg Dispo:   The patient is from: home Anticipated d/c is to: SNF  rehab Anticipated d/c date is: whenever bed available   Subjective and Interval History:  No new complaints or issues today.   Objective: Vitals:   09/29/22 2214 09/30/22 0631 09/30/22 0716 09/30/22 1739  BP: (!) 142/67 (!) 148/66 129/69 (!) 159/81  Pulse: 73 61 65 89  Resp: '18 18 19 20  '$ Temp: 98.5 F (36.9 C) 97.8 F (36.6 C) 98.7 F (37.1 C) 97.9 F (36.6 C)  TempSrc: Oral Oral Oral   SpO2: 96% 99% 98% 97%  Weight:      Height:        Intake/Output Summary (Last 24 hours) at 09/30/2022 1919 Last data filed at 09/30/2022 0850 Gross per 24 hour  Intake 240 ml  Output 650 ml  Net -410 ml   Filed Weights   09/24/22 0316  Weight: 88.8 kg    Examination:   Constitutional: NAD, AAOx3, sitting in recliner HEENT: conjunctivae and lids normal, EOMI CV: No cyanosis.   RESP: normal respiratory effort Neuro: II - XII grossly intact.   Psych: Normal mood and affect.  Appropriate judgement and reason    Data Reviewed: I have personally reviewed labs and imaging studies  Time spent: 25 minutes  Enzo Bi, MD [facility] If 7PM-7AM, please contact night-coverage 09/30/2022, 7:19 PM

## 2022-10-01 DIAGNOSIS — R531 Weakness: Secondary | ICD-10-CM | POA: Diagnosis not present

## 2022-10-01 LAB — BASIC METABOLIC PANEL
Anion gap: 7 (ref 5–15)
BUN: 22 mg/dL (ref 8–23)
CO2: 33 mmol/L — ABNORMAL HIGH (ref 22–32)
Calcium: 9.5 mg/dL (ref 8.9–10.3)
Chloride: 99 mmol/L (ref 98–111)
Creatinine, Ser: 1.04 mg/dL (ref 0.61–1.24)
GFR, Estimated: >60 mL/min (ref >60)
Glucose, Bld: 126 mg/dL — ABNORMAL HIGH (ref 70–99)
Potassium: 5.4 mmol/L — ABNORMAL HIGH (ref 3.5–5.1)
Sodium: 139 mmol/L (ref 135–145)

## 2022-10-01 LAB — CBC
HCT: 41.2 % (ref 39.0–52.0)
Hemoglobin: 13.8 g/dL (ref 13.0–17.0)
MCH: 30.8 pg (ref 26.0–34.0)
MCHC: 33.5 g/dL (ref 30.0–36.0)
MCV: 92 fL (ref 80.0–100.0)
Platelets: 249 10*3/uL (ref 150–400)
RBC: 4.48 MIL/uL (ref 4.22–5.81)
RDW: 12.7 % (ref 11.5–15.5)
WBC: 10.1 10*3/uL (ref 4.0–10.5)
nRBC: 0 % (ref 0.0–0.2)

## 2022-10-01 LAB — MAGNESIUM: Magnesium: 2.4 mg/dL (ref 1.7–2.4)

## 2022-10-01 MED ORDER — POLYETHYLENE GLYCOL 3350 17 G PO PACK: 17.0000 g | PACK | Freq: Every day | ORAL | Status: DC

## 2022-10-01 MED ORDER — POLYETHYLENE GLYCOL 3350 17 G PO PACK
17.0000 g | PACK | Freq: Every day | ORAL | Status: DC
  Administered 2022-10-01 – 2022-10-02 (×2): 17 g via ORAL
  Filled 2022-10-01 (×7): qty 1

## 2022-10-01 MED ORDER — SODIUM ZIRCONIUM CYCLOSILICATE 10 G PO PACK
10.0000 g | PACK | Freq: Two times a day (BID) | ORAL | Status: DC
  Filled 2022-10-01: qty 1

## 2022-10-01 MED ORDER — SODIUM ZIRCONIUM CYCLOSILICATE 10 G PO PACK
10.0000 g | PACK | Freq: Every day | ORAL | Status: AC
  Administered 2022-10-01: 10 g via ORAL
  Filled 2022-10-01: qty 1

## 2022-10-01 NOTE — TOC Progression Note (Signed)
Transition of Care [facility]) - Progression Note    Patient Details  Name: Bradley Bowers MRN: [id number] Date of Birth: 27-Jul-1946  Transition of Care [facility]) CM/SW [place], [place] Phone Number: 10/01/2022, 3:39 PM  Clinical Narrative:     TOC started authorization for the SNF. TOC needs to complete release and rename the authorization. TOC needs to start [facility] portal steps.  Expected Discharge Plan: [place] Barriers to Discharge: Continued Medical Work up  Expected Discharge Plan and Services   Discharge Planning Services: CM Consult Post Acute Care Choice: [place] Living arrangements for the past 2 months: Single Family Home                 DME Arranged: N/A DME Agency: NA       HH Arranged: NA HH Agency: NA         Social Determinants of Health (SDOH) Interventions SDOH Screenings   Food Insecurity: No Food Insecurity (09/24/2022)  Housing: Low Risk  (09/24/2022)  Transportation Needs: No Transportation Needs (09/24/2022)  Utilities: Not At Risk (09/24/2022)  Alcohol Screen: Low Risk  (08/16/2017)  Depression (PHQ2-9): Low Risk  (04/05/2021)  Tobacco Use: Medium Risk (09/23/2022)    Readmission Risk Interventions     No data to display

## 2022-10-01 NOTE — Progress Notes (Signed)
  PROGRESS NOTE    Bradley Bowers  [id number] DOB: [birth year] DOA: 09/23/2022 PCP: Rusty Aus, MD  115A/115A-AA  LOS: 8 days   Brief hospital course:   Assessment & Plan: Bradley Bowers is a 76 y.o. male with medical history significant for CAD, HTN, peripheral neuropathy, parkinsonism, prior stroke, bipolar 1 disorder, COPD on 2L at baseline, hospitalized recently from 11/19 to 11/29 with a UTI presenting with abdominal pain and difficulty ambulating who was brought to the ED with a 2-day history of generalized weakness resulting in 2 falls.  He states he has fallen before but this was different because he was unable to get up on his own or remain standing due to the weakness.    * Generalized weakness and falls --related to COVID infection and/or deconditioning.   --PT/OT --SNF rehab  COVID-19 virus infection Patient with 1 day of cough without hypoxia and chest x-ray clear Supportive care Airborne precautions  Chronic obstructive pulmonary disease (COPD) ([place]) Chronic hypoxemic respiratory failure on 2L O2 at baseline Not acutely exacerbated --cont Dulera and Spiriva --cont home 2L O2  History of TIA (transient ischemic attack) Continue aspirin and statin  Parkinsonian features Continue donepezil  Essential hypertension --hold HCTZ, Lisinopril, spironolactone, and torsemide due to low BP. --cont diltiazem  Coronary artery disease No active chest pain, troponin slightly elevated at 24.  EKG nonacute --cont ASA and statin  Bipolar affective disorder, current episode manic with psychotic symptoms (HCC) Continue Depakote, carbamazepine  AKI --Cr went from 1.64 to 2.06.  Likely due to reduced oral hydration.  Cr back to baseline 1.2 after IVF. --oral hydration now  Hyperkalemia --K+ 5.4. --lokelma 10g x1 today   DVT prophylaxis: Lovenox SQ Code Status: Full code  Family Communication:  Level of care: Med-Surg Dispo:   The patient is from:  home Anticipated d/c is to: SNF rehab Anticipated d/c date is: 10/03/22, after covid isolation   Subjective and Interval History:  Potassium mildly elevated today.   Objective: Vitals:   10/01/22 0559 10/01/22 0813 10/01/22 1811 10/01/22 2021  BP: (!) 147/75 136/72 (!) 113/58 137/61  Pulse: 73 76 67 68  Resp: '18 18 18 14  '$ Temp: 98.7 F (37.1 C) 98.2 F (36.8 C) 98.5 F (36.9 C) 98.1 F (36.7 C)  TempSrc: Oral     SpO2: 99% 97% 98% 96%  Weight:      Height:        Intake/Output Summary (Last 24 hours) at 10/01/2022 2254 Last data filed at 10/01/2022 0617 Gross per 24 hour  Intake 60 ml  Output 800 ml  Net -740 ml   Filed Weights   09/24/22 0316  Weight: 88.8 kg    Examination:   Constitutional: NAD, AAOx3 HEENT: conjunctivae and lids normal, EOMI CV: No cyanosis.   RESP: normal respiratory effort, on 2L Neuro: II - XII grossly intact.   Psych: Normal mood and affect.  Appropriate judgement and reason   Data Reviewed: I have personally reviewed labs and imaging studies  Time spent: 35 minutes  Enzo Bi, MD [facility] If 7PM-7AM, please contact night-coverage 10/01/2022, 10:54 PM

## 2022-10-02 DIAGNOSIS — R531 Weakness: Secondary | ICD-10-CM | POA: Diagnosis not present

## 2022-10-02 LAB — BASIC METABOLIC PANEL
Anion gap: 7 (ref 5–15)
BUN: 21 mg/dL (ref 8–23)
CO2: 31 mmol/L (ref 22–32)
Calcium: 9 mg/dL (ref 8.9–10.3)
Chloride: 101 mmol/L (ref 98–111)
Creatinine, Ser: 0.89 mg/dL (ref 0.61–1.24)
GFR, Estimated: >60 mL/min (ref >60)
Glucose, Bld: 95 mg/dL (ref 70–99)
Potassium: 4.5 mmol/L (ref 3.5–5.1)
Sodium: 139 mmol/L (ref 135–145)

## 2022-10-02 LAB — CBC
HCT: 36.9 % — ABNORMAL LOW (ref 39.0–52.0)
Hemoglobin: 12.2 g/dL — ABNORMAL LOW (ref 13.0–17.0)
MCH: 29.9 pg (ref 26.0–34.0)
MCHC: 33.1 g/dL (ref 30.0–36.0)
MCV: 90.4 fL (ref 80.0–100.0)
Platelets: 221 10*3/uL (ref 150–400)
RBC: 4.08 MIL/uL — ABNORMAL LOW (ref 4.22–5.81)
RDW: 12.6 % (ref 11.5–15.5)
WBC: 10.7 10*3/uL — ABNORMAL HIGH (ref 4.0–10.5)
nRBC: 0 % (ref 0.0–0.2)

## 2022-10-02 LAB — MAGNESIUM: Magnesium: 2.3 mg/dL (ref 1.7–2.4)

## 2022-10-02 MED ORDER — FLUTICASONE PROPIONATE 50 MCG/ACT NA SUSP
1.0000 | Freq: Every day | NASAL | Status: DC
  Administered 2022-10-02 – 2022-10-08 (×7): 1 via NASAL
  Filled 2022-10-02: qty 16

## 2022-10-02 NOTE — Care Management Important Message (Signed)
Important Message  Patient Details  Name: Bradley Bowers MRN: [id number] Date of Birth: 29-Dec-1945   Medicare Important Message Given:  Yes  Patient is in an isolation room so I called and reviewed his Important Message from Medicare with him by phone [phone]). He stated he understood his rights and I wished him well and thanked him for his time.   Juliann Pulse A [place] 10/02/2022, 3:20 PM

## 2022-10-02 NOTE — TOC Progression Note (Signed)
Transition of Care [facility]) - Progression Note    Patient Details  Name: Bradley Bowers MRN: [id number] Date of Birth: 02/26/1946  Transition of Care [facility]) CM/SW [place], [place] Phone Number: 10/02/2022, 4:02 PM  Clinical Narrative:     [facility] submited authorization on the [facility] portal. [place] ID is [id number] for dates of 10/03/2022. The status is pending. Plan auth ID is pending.  Expected Discharge Plan: [place] Barriers to Discharge: Continued Medical Work up  Expected Discharge Plan and Services   Discharge Planning Services: CM Consult Post Acute Care Choice: [place] Living arrangements for the past 2 months: Single Family Home                 DME Arranged: N/A DME Agency: NA       HH Arranged: NA HH Agency: NA         Social Determinants of Health (SDOH) Interventions SDOH Screenings   Food Insecurity: No Food Insecurity (09/24/2022)  Housing: Low Risk  (09/24/2022)  Transportation Needs: No Transportation Needs (09/24/2022)  Utilities: Not At Risk (09/24/2022)  Alcohol Screen: Low Risk  (08/16/2017)  Depression (PHQ2-9): Low Risk  (04/05/2021)  Tobacco Use: Medium Risk (09/23/2022)    Readmission Risk Interventions     No data to display

## 2022-10-02 NOTE — TOC Progression Note (Signed)
Transition of Care [facility]) - Progression Note    Patient Details  Name: Bradley Bowers MRN: [id number] Date of Birth: 03/15/1946  Transition of Care [facility]) CM/SW [place], [place] Phone Number: 10/02/2022, 3:15 PM  Clinical Narrative:     TOC has uploaded the notes to [employer] portal. TOC is waiting on the authorization number.  Expected Discharge Plan: [place] Barriers to Discharge: Continued Medical Work up  Expected Discharge Plan and Services   Discharge Planning Services: CM Consult Post Acute Care Choice: [place] Living arrangements for the past 2 months: Single Family Home                 DME Arranged: N/A DME Agency: NA       HH Arranged: NA HH Agency: NA         Social Determinants of Health (SDOH) Interventions SDOH Screenings   Food Insecurity: No Food Insecurity (09/24/2022)  Housing: Low Risk  (09/24/2022)  Transportation Needs: No Transportation Needs (09/24/2022)  Utilities: Not At Risk (09/24/2022)  Alcohol Screen: Low Risk  (08/16/2017)  Depression (PHQ2-9): Low Risk  (04/05/2021)  Tobacco Use: Medium Risk (09/23/2022)    Readmission Risk Interventions     No data to display

## 2022-10-02 NOTE — Progress Notes (Signed)
  Progress Note   Patient: Bradley Bowers ZOX:[id number] DOB: 1946-07-22 DOA: 09/23/2022     9 DOS: the patient was seen and examined on 10/02/2022   Brief hospital course: BRYTEN MAHER is a 76 y.o. male with medical history significant for CAD, HTN, peripheral neuropathy, parkinsonism, prior stroke, bipolar 1 disorder, COPD on 2L at baseline, hospitalized recently from 11/19 to 11/29 with a UTI presenting with abdominal pain and difficulty ambulating who was brought to the ED with a 2-day history of generalized weakness resulting in 2 falls.  Patient was found to be COVID-19 positive.   Assessment and Plan:  Generalized weakness and recurrent falls: Thought to be likely multifactorial etiology secondary to COVID infection.  PT and OT has been working with patient.  Recommendations include skilled nursing facility with acute rehab placement.  Anticipated discharge is on 10/03/2022.  COVID-19 infection: Present on admission.  Felt to be likely contributed to patient generalized weakness.  Patient has completed his isolation protocol with plans for possible discharge on 10/03/2022.  He is back on his baseline 2 L/min of oxygen.  COPD: Chronic hypoxic respiratory failure on 2 L/min of oxygen.  Continue with home medication.  Bronchodilators as needed.  Known history of TIA/CVA: On aspirin and statins.  No new focal deficits appreciated on this admission.  Essential hypertension: Continue with current regimen.  Blood pressure at goal.  Bipolar disorder: On Depakote and, Mazepine.  Mood stable.  Will continue with same upon discharge.  Acute kidney injury: Present on admission: Resolved.  Most likely due to dehydration.  Coronary artery disease: Asymptomatic on this presentation.  Will continue with cardioprotective medications to include aspirin and statins.  Hyperkalemia: Was given 1 dose of Lokelma x 1.  Resolved on labs today.     Subjective: Patient feels clinically better.   Still feels slightly congested.  Trial of Flonase will be offered for symptoms  Physical Exam: Vitals:   10/01/22 1811 10/01/22 2021 10/02/22 0830 10/02/22 1601  BP: (!) 113/58 137/61 (!) 147/61 (!) 134/115  Pulse: 67 68 82 79  Resp: '18 14 16 16  '$ Temp: 98.5 F (36.9 C) 98.1 F (36.7 C) 97.8 F (36.6 C) 98 F (36.7 C)  TempSrc:      SpO2: 98% 96% 95% 93%  Weight:      Height:       Patient looks clinically well.  He was seen sitting up comfortably in chair at bedside.  Not in any acute distress.  He looks well-hydrated.  Neck supple.  Chest clinically diminished.  No rales or rhonchi however appreciated.  He has 2 L of nasal cannula in place.  Abdomen rotund soft nontender.  Extremities with trace edema.  Gait could not be assessed at this time.  Mentation intact. Data Reviewed:  There are no new results to review at this time.  Family Communication: Discussed with case management regarding disposition.  Awaiting authorization for possible discharge to SNF/rehab.  Patient this ready for discharge as appropriate.  Disposition: Status is: Inpatient Remains inpatient appropriate because: Pending bed availability at acute rehab center and shortness authorization  Planned Discharge Destination: Skilled nursing facility    Time spent: 35 minutes  Author: Artist Beach, MD 10/02/2022 5:13 PM  For on call review [email or web address].

## 2022-10-02 NOTE — Progress Notes (Signed)
Physical Therapy Treatment Patient Details Name: Bradley Bowers MRN: [id number] DOB: 03/11/1946 Today's Date: 10/02/2022   History of Present Illness 76 y/o male presented to ED on 09/23/22 for weakness, falls, and "feeling sick". Found to be COVID+. PMH: bipolar 1 disorder, CVA, hypertension, Parkinson's, hyperlipidemia, COPD, peripheral neuropathy    PT Comments    Patient supine in bed on arrival and agreeable to PT tx session. On 2L O2, VSS throughout mobility. Able to ambulate in room 40' with RW and min guard and an additional 20' after extended seated rest break. Encouraged continued mobility with nursing staff and mobility specialists for improved activity tolerance and strength. Discussed SNF vs home with HHPT with patient but patient requesting to go to rehab prior to returning home. Continue to recommend SNF for ongoing Physical Therapy.       Recommendations for follow up therapy are one component of a multi-disciplinary discharge planning process, led by the attending physician.  Recommendations may be updated based on patient status, additional functional criteria and insurance authorization.  Follow Up Recommendations  Skilled nursing-short term rehab (<3 hours/day) Can patient physically be transported by private vehicle: Yes   Assistance Recommended at Discharge Frequent or constant Supervision/Assistance  Patient can return home with the following A little help with bathing/dressing/bathroom;Assistance with cooking/housework;Assist for transportation;Help with stairs or ramp for entrance;A little help with walking and/or transfers   Equipment Recommendations  None recommended by PT    Recommendations for Other Services       Precautions / Restrictions Precautions Precautions: Fall Precaution Comments: watch O2 Restrictions Weight Bearing Restrictions: No     Mobility  Bed Mobility Overal bed mobility: Needs Assistance Bed Mobility: Supine to Sit      Supine to sit: Supervision     General bed mobility comments: supervision for safety    Transfers Overall transfer level: Needs assistance Equipment used: Rolling Aldena Worm (2 wheels) Transfers: Sit to/from Stand Sit to Stand: Min guard           General transfer comment: min guard for safety    Ambulation/Gait Ambulation/Gait assistance: Min guard Gait Distance (Feet): 40 Feet (+20') Assistive device: Rolling Babe Clenney (2 wheels) Gait Pattern/deviations: Step-through pattern Gait velocity: decreased     General Gait Details: min guard for safety and line management. No overt LOB noted. Short step through pattern. VSS on 2L O2. Seated rest break between bouts   Stairs             Wheelchair Mobility    Modified Rankin (Stroke Patients Only)       Balance Overall balance assessment: Needs assistance, History of Falls Sitting-balance support: No upper extremity supported, Feet supported Sitting balance-[name]: Good     Standing balance support: Bilateral upper extremity supported, Reliant on assistive device for balance Standing balance-[name]: Fair                              Cognition Arousal/Alertness: Awake/alert Behavior During Therapy: WFL for tasks assessed/performed Overall Cognitive Status: Within Functional Limits for tasks assessed                                          Exercises      General Comments General comments (skin integrity, edema, etc.): VSS on 2L      Pertinent Vitals/Pain Pain Assessment  Pain Assessment: No/denies pain    Home Living                          Prior Function            PT Goals (current goals can now be found in the care plan section) Acute Rehab PT Goals Patient Stated Goal: to go to rehab PT Goal Formulation: With patient Time For Goal Achievement: 10/08/22 Potential to Achieve Goals: Good Progress towards PT goals: Progressing toward goals     Frequency    Min 2X/week      PT Plan Current plan remains appropriate    Co-evaluation              AM-PAC PT "6 Clicks" Mobility   Outcome Measure  Help needed turning from your back to your side while in a flat bed without using bedrails?: A Little Help needed moving from lying on your back to sitting on the side of a flat bed without using bedrails?: A Little Help needed moving to and from a bed to a chair (including a wheelchair)?: A Little Help needed standing up from a chair using your arms (e.g., wheelchair or bedside chair)?: A Little Help needed to walk in hospital room?: A Little Help needed climbing 3-5 steps with a railing? : A Lot 6 Click Score: 17    End of Session Equipment Utilized During Treatment: Gait belt;Oxygen Activity Tolerance: Patient tolerated treatment well;Patient limited by fatigue Patient left: in chair;with call bell/phone within reach;with chair alarm set Nurse Communication: Mobility status PT Visit Diagnosis: Muscle weakness (generalized) (M62.81);Unsteadiness on feet (R26.81);Repeated falls (R29.6);History of falling (Z91.81);Other abnormalities of gait and mobility (R26.89)     Time: 8088-1103 PT Time Calculation (min) (ACUTE ONLY): 28 min  Charges:  $Gait Training: 8-22 mins $Therapeutic Activity: 8-22 mins                     Maika Kaczmarek A. [name] PT, DPT [facility] - Acute Rehabilitation Services    Rosalind Guido A Leopold Smyers 10/02/2022, 4:03 PM

## 2022-10-03 DIAGNOSIS — J438 Other emphysema: Secondary | ICD-10-CM

## 2022-10-03 DIAGNOSIS — I10 Essential (primary) hypertension: Secondary | ICD-10-CM

## 2022-10-03 DIAGNOSIS — U071 COVID-19: Secondary | ICD-10-CM | POA: Diagnosis not present

## 2022-10-03 DIAGNOSIS — F312 Bipolar disorder, current episode manic severe with psychotic features: Secondary | ICD-10-CM

## 2022-10-03 DIAGNOSIS — R531 Weakness: Secondary | ICD-10-CM | POA: Diagnosis not present

## 2022-10-03 NOTE — Progress Notes (Signed)
[facility]                                                                               Yuma Pacella, is a 76 y.o. male, DOB - 03-30-1946, TKZ:[id number] Admit date - 09/23/2022    Outpatient Primary MD for the patient is Rusty Aus, MD  LOS - 10  days    Brief summary   Bradley Bowers is a 76 y.o. male with medical history significant for CAD, HTN, peripheral neuropathy, parkinsonism, prior stroke, bipolar 1 disorder, COPD on 2L at baseline, hospitalized recently from 11/19 to 11/29 with a UTI presenting with abdominal pain and difficulty ambulating who was brought to the ED with a 2-day history of generalized weakness resulting in 2 falls.  Patient was found to be COVID-19 positive.     Assessment & Plan    Assessment and Plan: * Generalized weakness Multifactorial, related to COVID infection superimposed on chronic comorbid conditions including peripheral neuropathy, parkinsonism, restless leg syndrome Therapy evaluation recommending SNF.    COVID-19 virus infection Probably contributed to patient's generalized weakness. Completed isolation by 10/03/2022. Patient is currently on 2 L of nasal cannula oxygen which is his baseline.  Chronic obstructive pulmonary disease (COPD) (HCC)/chronic respiratory failure on 2 L of nasal cannula oxygen at home No wheezing heard on exam at this time. Continue Advair with albuterol as needed  History of TIA (transient ischemic attack) Continue aspirin and statin. Generalized weakness.  Parkinsonian features Continue donepezil  Essential hypertension Blood pressure parameters are optimal  Coronary artery disease Patient currently denies any chest pain. EKG does not show any ischemic changes.  Continue with aspirin.  Bipolar affective disorder, current episode manic with psychotic symptoms ([place]) Received home Depakote and carbamazepine.   Hyperkalemia Resolved   AKI Secondary to  dehydration Creatinine at baseline today.   Estimated body mass index is 31.6 kg/m as calculated from the following:   Height as of this encounter: '5\' 6"'$  (1.676 m).   Weight as of this encounter: 88.8 kg.  Code Status: Full code DVT Prophylaxis:  SCDs/Lovenox   Level of Care: Level of care: Med-Surg Family Communication: None at bedside  Disposition Plan:     Remains inpatient appropriate: Pending insurance Auth possibly discharge in the next 24 hours once Auth is obtained  Procedures:  None.   Consultants:   None.   Antimicrobials:   Anti-infectives (From admission, onward)    Start     Dose/Rate Route Frequency Ordered Stop   09/25/22 2200  molnupiravir EUA (LAGEVRIO) capsule 800 mg        4 capsule Oral 2 times daily 09/25/22 1742 09/30/22 0928        Medications  Scheduled Meds:  aspirin EC  81 mg Oral Daily   atorvastatin  80 mg Oral Daily   carbamazepine  200 mg Oral BID   diltiazem  180 mg Oral Daily   divalproex  500 mg Oral Daily   donepezil  10 mg Oral QHS   enoxaparin (LOVENOX) injection  0.5 mg/kg Subcutaneous Q24H   feeding supplement  237 mL Oral BID BM   fluticasone  1 spray Each Nare Daily  mometasone-formoterol  2 puff Inhalation BID   montelukast  10 mg Oral QHS   pantoprazole  40 mg Oral BID   polyethylene glycol  17 g Oral QHS   timolol  1 drop Both Eyes BID   tiotropium  18 mcg Inhalation Daily   Continuous Infusions: PRN Meds:.acetaminophen, albuterol, chlorpheniramine-HYDROcodone, guaiFENesin-dextromethorphan, ondansetron **OR** ondansetron (ZOFRAN) IV    Subjective:   Bradley Bowers was seen and examined today.  No new complaints.   Objective:   Vitals:   10/02/22 0830 10/02/22 1601 10/02/22 2137 10/03/22 0932  BP: (!) 147/61 (!) 134/115 126/68 (!) 148/59  Pulse: 82 79 76 83  Resp: '16 16 19 20  '$ Temp: 97.8 F (36.6 C) 98 F (36.7 C) 98.6 F (37 C) 98.7 F (37.1 C)  TempSrc:      SpO2: 95% 93% 96% 93%  Weight:       Height:        Intake/Output Summary (Last 24 hours) at 10/03/2022 1259 Last data filed at 10/03/2022 0600 Gross per 24 hour  Intake 260 ml  Output 550 ml  Net -290 ml   Filed Weights   09/24/22 0316  Weight: 88.8 kg     Exam General exam: Elderly gentleman not in any kind of distress on 2 L of nasal cannula oxygen Respiratory system: Clear to auscultation. Respiratory effort normal. Cardiovascular system: S1 & S2 heard, RRR. No JVD, murmurs,  GI  Abdomen is nondistended, soft and nontender.  Central nervous system: Alert and oriented. No focal neurological deficits. Extremities: Symmetric 5 x 5 power. Skin: No rashes, lesions or ulcers Psychiatry:. Mood & affect appropriate.     Data Reviewed:  I have personally reviewed following labs and imaging studies   CBC Lab Results  Component Value Date   WBC 10.7 (H) 10/02/2022   RBC 4.08 (L) 10/02/2022   HGB 12.2 (L) 10/02/2022   HCT 36.9 (L) 10/02/2022   MCV 90.4 10/02/2022   MCH 29.9 10/02/2022   PLT 221 10/02/2022   MCHC 33.1 10/02/2022   RDW 12.6 10/02/2022   LYMPHSABS 2.0 01/29/2022   MONOABS 0.7 01/29/2022   EOSABS 0.4 01/29/2022   BASOSABS 0.0 72/53/6644     Last metabolic panel Lab Results  Component Value Date   NA 139 10/02/2022   K 4.5 10/02/2022   CL 101 10/02/2022   CO2 31 10/02/2022   BUN 21 10/02/2022   CREATININE 0.89 10/02/2022   GLUCOSE 95 10/02/2022   GFRNONAA >60 10/02/2022   GFRAA >60 09/23/2016   CALCIUM 9.0 10/02/2022   PHOS 4.9 (H) 08/25/2022   PROT 7.4 09/24/2022   ALBUMIN 3.5 09/24/2022   BILITOT 0.7 09/24/2022   ALKPHOS 72 09/24/2022   AST 30 09/24/2022   ALT 27 09/24/2022   ANIONGAP 7 10/02/2022    CBG (last 3)  No results for input(s): "GLUCAP" in the last 72 hours.    Coagulation Profile: No results for input(s): "INR", "PROTIME" in the last 168 hours.   Radiology Studies: No results found.     Hosie Poisson M.D. [facility] 10/03/2022, 12:59  PM  Available via Epic secure chat 7am-7pm After 7 pm, please refer to night coverage provider listed on amion.

## 2022-10-04 ENCOUNTER — Inpatient Hospital Stay: Payer: Medicare HMO

## 2022-10-04 DIAGNOSIS — I2510 Atherosclerotic heart disease of native coronary artery without angina pectoris: Secondary | ICD-10-CM

## 2022-10-04 DIAGNOSIS — I10 Essential (primary) hypertension: Secondary | ICD-10-CM | POA: Diagnosis not present

## 2022-10-04 DIAGNOSIS — U071 COVID-19: Secondary | ICD-10-CM | POA: Diagnosis not present

## 2022-10-04 DIAGNOSIS — J438 Other emphysema: Secondary | ICD-10-CM | POA: Diagnosis not present

## 2022-10-04 DIAGNOSIS — R29818 Other symptoms and signs involving the nervous system: Secondary | ICD-10-CM

## 2022-10-04 DIAGNOSIS — R531 Weakness: Secondary | ICD-10-CM | POA: Diagnosis not present

## 2022-10-04 LAB — CBC
HCT: 37.2 % — ABNORMAL LOW (ref 39.0–52.0)
Hemoglobin: 12.7 g/dL — ABNORMAL LOW (ref 13.0–17.0)
MCH: 30.7 pg (ref 26.0–34.0)
MCHC: 34.1 g/dL (ref 30.0–36.0)
MCV: 89.9 fL (ref 80.0–100.0)
Platelets: 285 10*3/uL (ref 150–400)
RBC: 4.14 MIL/uL — ABNORMAL LOW (ref 4.22–5.81)
RDW: 12.8 % (ref 11.5–15.5)
WBC: 11.8 10*3/uL — ABNORMAL HIGH (ref 4.0–10.5)
nRBC: 0 % (ref 0.0–0.2)

## 2022-10-04 LAB — BASIC METABOLIC PANEL
Anion gap: 9 (ref 5–15)
BUN: 20 mg/dL (ref 8–23)
CO2: 27 mmol/L (ref 22–32)
Calcium: 8.7 mg/dL — ABNORMAL LOW (ref 8.9–10.3)
Chloride: 102 mmol/L (ref 98–111)
Creatinine, Ser: 0.97 mg/dL (ref 0.61–1.24)
GFR, Estimated: >60 mL/min (ref >60)
Glucose, Bld: 95 mg/dL (ref 70–99)
Potassium: 4.4 mmol/L (ref 3.5–5.1)
Sodium: 138 mmol/L (ref 135–145)

## 2022-10-04 LAB — MAGNESIUM: Magnesium: 2.3 mg/dL (ref 1.7–2.4)

## 2022-10-04 MED ORDER — TORSEMIDE 20 MG PO TABS: 20.0000 mg | ORAL_TABLET | Freq: Every day | ORAL | Status: DC

## 2022-10-04 MED ORDER — ENSURE ENLIVE PO LIQD: 237.0000 mL | Freq: Two times a day (BID) | ORAL | 12 refills | Status: AC

## 2022-10-04 MED ORDER — MOMETASONE FURO-FORMOTEROL FUM 100-5 MCG/ACT IN AERO: 2.0000 | INHALATION_SPRAY | Freq: Two times a day (BID) | RESPIRATORY_TRACT | Status: DC

## 2022-10-04 MED ORDER — CLOPIDOGREL BISULFATE 75 MG PO TABS
75.0000 mg | ORAL_TABLET | Freq: Every day | ORAL | Status: DC
  Administered 2022-10-04 – 2022-10-08 (×5): 75 mg via ORAL
  Filled 2022-10-04 (×5): qty 1

## 2022-10-04 MED ORDER — POLYETHYLENE GLYCOL 3350 17 G PO PACK: 17.0000 g | PACK | Freq: Every day | ORAL | 0 refills | Status: DC | PRN

## 2022-10-04 NOTE — TOC Progression Note (Addendum)
Transition of Care [facility]) - Progression Note    Patient Details  Name: Bradley Bowers MRN: [id number] Date of Birth: 21-Mar-1946  Transition of Care [facility]) CM/SW [place], [place] Phone Number: 10/04/2022, 2:24 PM  Clinical Narrative:     [facility] uploaded PT notes to the [facility] is still pending.  Expected Discharge Plan: [place] Barriers to Discharge: Continued Medical Work up  Expected Discharge Plan and Services   Discharge Planning Services: CM Consult Post Acute Care Choice: [place] Living arrangements for the past 2 months: Single Family Home                 DME Arranged: N/A DME Agency: NA       HH Arranged: NA HH Agency: NA         Social Determinants of Health (SDOH) Interventions SDOH Screenings   Food Insecurity: No Food Insecurity (09/24/2022)  Housing: Low Risk  (09/24/2022)  Transportation Needs: No Transportation Needs (09/24/2022)  Utilities: Not At Risk (09/24/2022)  Alcohol Screen: Low Risk  (08/16/2017)  Depression (PHQ2-9): Low Risk  (04/05/2021)  Tobacco Use: Medium Risk (09/23/2022)    Readmission Risk Interventions     No data to display

## 2022-10-04 NOTE — Discharge Summary (Signed)
Physician Discharge Summary   Patient: Bradley Bowers MRN: [id number] DOB: 09-26-1946  Admit date:     09/23/2022  Discharge date: 10/05/22  Discharge Physician: Hosie Poisson   PCP: Rusty Aus, MD   Recommendations at discharge:  Please follow up with PCp in one week.  Please check cbc and bmp in one week.    Discharge Diagnoses: Principal Problem:   Generalized weakness Active Problems:   COVID-19 virus infection   Chronic obstructive pulmonary disease (COPD) (HCC)   Bipolar affective disorder, current episode manic with psychotic symptoms (HCC)   Coronary artery disease   Essential hypertension   Weakness   Parkinsonian features   History of TIA (transient ischemic attack)    Hospital Course:   Bradley Bowers is a 76 y.o. male with medical history significant for CAD, HTN, peripheral neuropathy, parkinsonism, prior stroke, bipolar 1 disorder, COPD on 2L at baseline, hospitalized recently from 11/19 to 11/29 with a UTI presenting with abdominal pain and difficulty ambulating who was brought to the ED with a 2-day history of generalized weakness resulting in 2 falls.  Patient was found to be COVID-19 positive.     Assessment and Plan:  Generalized weakness Multifactorial, related to COVID infection superimposed on chronic comorbid conditions including peripheral neuropathy, parkinsonism, restless leg syndrome Therapy evaluation recommending SNF.      COVID-19 virus infection Probably contributed to patient's generalized weakness. Completed isolation by 10/03/2022. Patient is currently on 2 L of nasal cannula oxygen which is his baseline.   Chronic obstructive pulmonary disease (COPD) (HCC)/chronic respiratory failure on 2 L of nasal cannula oxygen at home No wheezing heard on exam at this time. Continue Advair with albuterol as needed   History of TIA (transient ischemic attack) Continue aspirin and statin. Generalized weakness.   Parkinsonian  features Continue donepezil   Essential hypertension Blood pressure parameters are optimal.    Coronary artery disease Patient currently denies any chest pain. EKG does not show any ischemic changes.  Continue with aspirin, plavix and statin.    Bipolar affective disorder, current episode manic with psychotic symptoms ([place]) Received home Depakote and carbamazepine.     Hyperkalemia Resolved     AKI Secondary to dehydration Creatinine at baseline today.      Estimated body mass index is 31.6 kg/m as calculated from the following:   Height as of this encounter: '5\' 6"'$  (1.676 m).   Weight as of this encounter: 88.8 kg.       Consultants: none.  Procedures performed: none.   Disposition: Skilled nursing facility Diet recommendation:  Cardiac diet DISCHARGE MEDICATION: Allergies as of 10/04/2022   No Known Allergies      Medication List     STOP taking these medications    Advair Diskus 100-50 MCG/ACT Aepb Generic drug: fluticasone-salmeterol Replaced by: mometasone-formoterol 100-5 MCG/ACT Aero   hydrochlorothiazide 12.5 MG capsule Commonly known as: MICROZIDE   lisinopril 2.5 MG tablet Commonly known as: ZESTRIL   temazepam 15 MG capsule Commonly known as: RESTORIL       TAKE these medications    albuterol 108 (90 Base) MCG/ACT inhaler Commonly known as: VENTOLIN HFA Inhale 1-2 puffs into the lungs every 4 (four) hours as needed.   aspirin EC 81 MG tablet Take 81 mg by mouth daily.   atorvastatin 80 MG tablet Commonly known as: LIPITOR Take 80 mg by mouth daily.   carbamazepine 200 MG tablet Commonly known as: TEGRETOL Take 1 tablet by mouth 2 (two)  times daily.   cholecalciferol 25 MCG (1000 UNIT) tablet Commonly known as: VITAMIN D3 Take 1,000 Units by mouth daily.   clopidogrel 75 MG tablet Commonly known as: PLAVIX Take 75 mg by mouth daily.   cyanocobalamin 1000 MCG tablet Commonly known as: VITAMIN B12 Take 1,000 mcg by  mouth daily.   diltiazem 180 MG 24 hr capsule Commonly known as: CARDIZEM CD Take 180 mg by mouth daily.   divalproex 500 MG 24 hr tablet Commonly known as: DEPAKOTE ER Take 500 mg by mouth daily.   donepezil 10 MG tablet Commonly known as: ARICEPT Take 1 tablet by mouth at bedtime.   feeding supplement Liqd Take 237 mLs by mouth 2 (two) times daily between meals. Start taking on: October 05, 2022   fluticasone 50 MCG/ACT nasal spray Commonly known as: FLONASE Place 1 spray into both nostrils daily.   mometasone-formoterol 100-5 MCG/ACT Aero Commonly known as: DULERA Inhale 2 puffs into the lungs 2 (two) times daily. Replaces: Advair Diskus 100-50 MCG/ACT Aepb   montelukast 10 MG tablet Commonly known as: SINGULAIR Take 10 mg by mouth at bedtime.   pantoprazole 40 MG tablet Commonly known as: PROTONIX Take 40 mg by mouth 2 (two) times daily.   polyethylene glycol 17 g packet Commonly known as: MIRALAX / GLYCOLAX Take 17 g by mouth daily as needed.   spironolactone 25 MG tablet Commonly known as: ALDACTONE Take 1 tablet by mouth daily.   timolol 0.5 % ophthalmic solution Commonly known as: TIMOPTIC 1 drop 2 (two) times daily.   tiotropium 18 MCG inhalation capsule Commonly known as: SPIRIVA Place 18 mcg into inhaler and inhale daily.   torsemide 20 MG tablet Commonly known as: DEMADEX Take 20 mg by mouth daily.   Travatan Z 0.004 % Soln ophthalmic solution Generic drug: Travoprost (BAK Free) Place 1 drop into both eyes at bedtime.        Contact information for after-discharge care     Destination     [facility] [place] [facility] .   Service: Skilled Nursing Contact information: [address] [place] [phone]                    Discharge Exam: Danley Danker Weights   09/24/22 0316  Weight: 88.8 kg   General exam: Appears calm and comfortable  Respiratory system: Clear to auscultation.  Respiratory effort normal. Cardiovascular system: S1 & S2 heard, RRR. No JVD, murmurs, rubs, gallops or clicks. No pedal edema. Gastrointestinal system: Abdomen is nondistended, soft and nontender. No organomegaly or masses felt. Normal bowel sounds heard. Central nervous system: Alert and oriented. No focal neurological deficits. Extremities: Symmetric 5 x 5 power. Skin: No rashes, lesions or ulcers Psychiatry: Judgement and insight appear normal. Mood & affect appropriate.    Condition at discharge: fair  The results of significant diagnostics from this hospitalization (including imaging, microbiology, ancillary and laboratory) are listed below for reference.   Imaging Studies: DG Chest Port 1 View  Result Date: 09/23/2022 CLINICAL DATA:  Questionable sepsis EXAM: PORTABLE CHEST 1 VIEW COMPARISON:  Chest x-ray 08/25/2022 FINDINGS: The heart size and mediastinal contours are within normal limits. Both lungs are clear. The visualized skeletal structures are unremarkable. IMPRESSION: No active disease. Electronically Signed   By: Ronney Asters M.D.   On: 09/23/2022 21:55   CT Head Wo Contrast  Result Date: 09/23/2022 CLINICAL DATA:  Head trauma, minor EXAM: CT HEAD WITHOUT CONTRAST TECHNIQUE: Contiguous axial images  were obtained from the base of the skull through the vertex without intravenous contrast. RADIATION DOSE REDUCTION: This exam was performed according to the departmental dose-optimization program which includes automated exposure control, adjustment of the mA and/or kV according to patient size and/or use of iterative reconstruction technique. COMPARISON:  CT head dated August 25, 2022 FINDINGS: Brain: No evidence of acute infarction, hemorrhage, hydrocephalus, extra-axial collection or mass lesion/mass effect. Prominence of the ventricles and sulci secondary to moderate generalized cerebral volume loss, unchanged. Patchy area of low-attenuation of the periventricular white matter  presumed chronic microvascular ischemic changes. Vascular: No hyperdense vessel or unexpected calcification. Skull: Normal. Negative for fracture or focal lesion. Sinuses/Orbits: No acute finding.  Bilateral cataract surgery. Other: None. IMPRESSION: 1. No acute intracranial abnormality. 2. Chronic microvascular ischemic changes of the periventricular white matter. 3. Moderate generalized cerebral volume loss, unchanged. Electronically Signed   By: Keane Police D.O.   On: 09/23/2022 21:40   MR ABDOMEN WWO CONTRAST  Result Date: 09/15/2022 CLINICAL DATA:  No clinical history provided. Upper pole right renal cyst noted on prior CT scan. EXAM: MRI ABDOMEN WITHOUT AND WITH CONTRAST TECHNIQUE: Multiplanar multisequence MR imaging of the abdomen was performed both before and after the administration of intravenous contrast. CONTRAST:  51m GADAVIST GADOBUTROL 1 MMOL/ML IV SOLN COMPARISON:  CT scan 08/25/2022 FINDINGS: Lower chest: The lung bases are clear acute process. No pulmonary lesions, pleural or pericardial effusion. Hepatobiliary: No hepatic lesions or intrahepatic biliary dilatation. Single gallstone noted in the gallbladder. No findings for acute cholecystitis. Normal caliber and course of the common bile duct. Pancreas:  No mass, inflammation or ductal dilatation. Spleen:  Normal size. No focal lesions. Adrenals/Urinary Tract:  The adrenal glands are normal. Multiple bilateral Bosniak 1 and Bosniak 2 cysts. Small bilateral hemorrhagic cysts are noted along with some small septated cysts. Cluster of exophytic cystic lesions involving the upper pole region right kidney posteriorly do not show any contrast enhancement. I suspect these are collapsed/ruptured cysts. No worrisome renal lesions requiring follow-up. Stomach/Bowel: The stomach, duodenum, visualized small bowel and visualized colon are unremarkable. Vascular/Lymphatic: The aorta and branch vessels are patent. The major venous structures are patent.  No mesenteric or retroperitoneal mass or adenopathy. Other:  No ascites or abdominal wall hernia. Musculoskeletal: No significant bony findings. Scattered spinal hemangiomas. IMPRESSION: 1. Multiple bilateral Bosniak 1 and Bosniak 2 cysts. Cluster of small exophytic cystic lesions involving the upper pole region right kidney posteriorly do not show any contrast enhancement and are likely collapsed/ruptured cysts. No worrisome renal lesions requiring follow-up. 2. Single gallstone in the gallbladder. No findings for acute cholecystitis. 3. No acute abdominal findings, mass lesions or adenopathy. Electronically Signed   By: PMarijo SanesM.D.   On: 09/15/2022 11:44    Microbiology: Results for orders placed or performed during the hospital encounter of 09/23/22  Resp panel by RT-PCR (RSV, Flu A&B, Covid)     Status: Abnormal   Collection Time: 09/23/22  9:00 PM   Specimen: Nasal Swab  Result Value Ref Range Status   SARS Coronavirus 2 by RT PCR POSITIVE (A) NEGATIVE Final    Comment: (NOTE) SARS-CoV-2 target nucleic acids are DETECTED.  The SARS-CoV-2 RNA is generally detectable in upper respiratory specimens during the acute phase of infection. Positive results are indicative of the presence of the identified virus, but do not rule out bacterial infection or co-infection with other pathogens not detected by the test. Clinical correlation with patient history and other diagnostic information  is necessary to determine patient infection status. The expected result is Negative.  Fact Sheet for Patients: EntrepreneurPulse.com.au  Fact Sheet for Healthcare Providers: IncredibleEmployment.be  This test is not yet approved or cleared by the [place] FDA and  has been authorized for detection and/or diagnosis of SARS-CoV-2 by FDA under an Emergency Use Authorization (EUA).  This EUA will remain in effect (meaning this test can be used) for the duration of   the COVID-19 declaration under Section 564(b)(1) of the A ct, 21 U.S.C. section 360bbb-3(b)(1), unless the authorization is terminated or revoked sooner.     Influenza A by PCR NEGATIVE NEGATIVE Final   Influenza B by PCR NEGATIVE NEGATIVE Final    Comment: (NOTE) The Xpert Xpress SARS-CoV-2/FLU/RSV plus assay is intended as an aid in the diagnosis of influenza from Nasopharyngeal swab specimens and should not be used as a sole basis for treatment. Nasal washings and aspirates are unacceptable for Xpert Xpress SARS-CoV-2/FLU/RSV testing.  Fact Sheet for Patients: EntrepreneurPulse.com.au  Fact Sheet for Healthcare Providers: IncredibleEmployment.be  This test is not yet approved or cleared by the [place] FDA and has been authorized for detection and/or diagnosis of SARS-CoV-2 by FDA under an Emergency Use Authorization (EUA). This EUA will remain in effect (meaning this test can be used) for the duration of the COVID-19 declaration under Section 564(b)(1) of the Act, 21 U.S.C. section 360bbb-3(b)(1), unless the authorization is terminated or revoked.     Resp Syncytial Virus by PCR NEGATIVE NEGATIVE Final    Comment: (NOTE) Fact Sheet for Patients: EntrepreneurPulse.com.au  Fact Sheet for Healthcare Providers: IncredibleEmployment.be  This test is not yet approved or cleared by the [place] FDA and has been authorized for detection and/or diagnosis of SARS-CoV-2 by FDA under an Emergency Use Authorization (EUA). This EUA will remain in effect (meaning this test can be used) for the duration of the COVID-19 declaration under Section 564(b)(1) of the Act, 21 U.S.C. section 360bbb-3(b)(1), unless the authorization is terminated or revoked.  Performed at [facility], [address], [place], [place] [address]   Urine Culture     Status: None   Collection Time: 09/23/22  9:29 PM    Specimen: Urine, Random  Result Value Ref Range Status   Specimen Description   Final    URINE, RANDOM Performed at [facility], [address] [place], [place] [address]    Special Requests   Final    NONE Performed at [facility], [address], [place], [place] [address]    Culture   Final    NO GROWTH Performed at [facility], [place] [address] [place], [place] [address]    Report Status 09/25/2022 FINAL  Final  C Difficile Quick Screen w PCR reflex     Status: None   Collection Time: 09/25/22 12:44 PM   Specimen: STOOL  Result Value Ref Range Status   C Diff antigen NEGATIVE NEGATIVE Final   C Diff toxin NEGATIVE NEGATIVE Final   C Diff interpretation No C. difficile detected.  Final    Comment: Performed at [facility], [address] [place], [place] [address]    Labs: CBC: Recent Labs  Lab 10/01/22 1234 10/02/22 0545 10/04/22 0403  WBC 10.1 10.7* 11.8*  HGB 13.8 12.2* 12.7*  HCT 41.2 36.9* 37.2*  MCV 92.0 90.4 89.9  PLT 249 221 169   Basic Metabolic Panel: Recent Labs  Lab 10/01/22 1234 10/02/22 0545 10/04/22 0403  NA  139 139 138  K 5.4* 4.5 4.4  CL 99 101 102  CO2 33* 31 27  GLUCOSE 126* 95 95  BUN '22 21 20  '$ CREATININE 1.04 0.89 0.97  CALCIUM 9.5 9.0 8.7*  MG 2.4 2.3 2.3   Liver Function Tests: No results for input(s): "AST", "ALT", "ALKPHOS", "BILITOT", "PROT", "ALBUMIN" in the last 168 hours. CBG: No results for input(s): "GLUCAP" in the last 168 hours.  Discharge time spent: 39 minutes.   Signed: Hosie Poisson, MD [facility] 10/04/2022

## 2022-10-04 NOTE — Progress Notes (Signed)
Physical Therapy Treatment Patient Details Name: Bradley Bowers MRN: [id number] DOB: 03/02/1946 Today's Date: 10/04/2022   History of Present Illness 76 y/o male presented to ED on 09/23/22 for weakness, falls, and "feeling sick". Found to be COVID+. PMH: bipolar 1 disorder, CVA, hypertension, Parkinson's, hyperlipidemia, COPD, peripheral neuropathy    PT Comments    Pt received supine in bed agreeable to PT. Performs bed mobility at supervision level.  Pt able to tolerate 2 x40' bouts with seated rest b/t with SPO2 > 90% with mobility throughout. HR also WNL with physical activity with no signs of imbalance or LE weakness. Pt slightly self limiting this date declining further gait efforts returning to supine in bed. Educated pt on consistent OOB mobility daily to improve functional LE strength to return to PLOF. Pt making steady progress towards goals. Pt appears close to baseline but due to recurrent falls at home after last admission will still plan to rec STR at discharge.    Recommendations for follow up therapy are one component of a multi-disciplinary discharge planning process, led by the attending physician.  Recommendations may be updated based on patient status, additional functional criteria and insurance authorization.  Follow Up Recommendations  Skilled nursing-short term rehab (<3 hours/day) Can patient physically be transported by private vehicle: Yes   Assistance Recommended at Discharge Frequent or constant Supervision/Assistance  Patient can return home with the following A little help with bathing/dressing/bathroom;Assistance with cooking/housework;Assist for transportation;Help with stairs or ramp for entrance;A little help with walking and/or transfers   Equipment Recommendations  None recommended by PT    Recommendations for Other Services       Precautions / Restrictions Precautions Precautions: Fall Precaution Comments: watch O2 Restrictions Weight Bearing  Restrictions: No     Mobility  Bed Mobility Overal bed mobility: Needs Assistance Bed Mobility: Supine to Sit, Sit to Supine     Supine to sit: Supervision Sit to supine: Supervision     Patient Response: Cooperative  Transfers Overall transfer level: Needs assistance Equipment used: Rolling walker (2 wheels) Transfers: Sit to/from Stand Sit to Stand: Supervision           General transfer comment: Safe hand placement    Ambulation/Gait Ambulation/Gait assistance: Min guard Gait Distance (Feet): 80 Feet (80' total. 2x 40' bouts with seated rest.) Assistive device: Rolling walker (2 wheels) Gait Pattern/deviations: Step-through pattern       General Gait Details: ambulating at supervision level with safe RW turns.   Stairs             Wheelchair Mobility    Modified Rankin (Stroke Patients Only)       Balance Overall balance assessment: Needs assistance, History of Falls Sitting-balance support: No upper extremity supported, Feet supported Sitting balance-[name]: Good     Standing balance support: Bilateral upper extremity supported, Reliant on assistive device for balance Standing balance-[name]: Fair                              Cognition Arousal/Alertness: Awake/alert Behavior During Therapy: WFL for tasks assessed/performed Overall Cognitive Status: Within Functional Limits for tasks assessed                                          Exercises Other Exercises Other Exercises: Need to participate in more OOb mobility to  improve functional strength    General Comments General comments (skin integrity, edema, etc.): VSS throughout mobility on 2 L/min      Pertinent Vitals/Pain Pain Assessment Pain Assessment: No/denies pain    Home Living                          Prior Function            PT Goals (current goals can now be found in the care plan section) Acute Rehab PT  Goals Patient Stated Goal: to go to rehab PT Goal Formulation: With patient Time For Goal Achievement: 10/08/22 Potential to Achieve Goals: Good Progress towards PT goals: Progressing toward goals    Frequency    Min 2X/week      PT Plan Current plan remains appropriate    Co-evaluation              AM-PAC PT "6 Clicks" Mobility   Outcome Measure  Help needed turning from your back to your side while in a flat bed without using bedrails?: A Little Help needed moving from lying on your back to sitting on the side of a flat bed without using bedrails?: A Little Help needed moving to and from a bed to a chair (including a wheelchair)?: A Little Help needed standing up from a chair using your arms (e.g., wheelchair or bedside chair)?: A Little Help needed to walk in hospital room?: A Little Help needed climbing 3-5 steps with a railing? : A Lot 6 Click Score: 17    End of Session Equipment Utilized During Treatment: Gait belt;Oxygen Activity Tolerance: Patient tolerated treatment well Patient left: in bed;with call bell/phone within reach;with bed alarm set Nurse Communication: Mobility status PT Visit Diagnosis: Muscle weakness (generalized) (M62.81);Unsteadiness on feet (R26.81);Repeated falls (R29.6);History of falling (Z91.81);Other abnormalities of gait and mobility (R26.89)     Time: 1355-1415 PT Time Calculation (min) (ACUTE ONLY): 20 min  Charges:  $Therapeutic Exercise: 8-22 mins                    Salem Caster. Fairly IV, PT, DPT Physical Therapist- [facility]  10/04/2022, 2:53 PM

## 2022-10-05 LAB — GLUCOSE, CAPILLARY: Glucose-Capillary: 128 mg/dL — ABNORMAL HIGH (ref 70–99)

## 2022-10-05 NOTE — Progress Notes (Addendum)
[facility]                                                                               Bradley Bowers, is a 76 y.o. male, DOB - April 05, 1946, [id number] Admit date - 09/23/2022    Outpatient Primary MD for the patient is Rusty Aus, MD  LOS - 12  days    Brief summary   75 y.o. male with medical history significant for CAD, HTN, peripheral neuropathy, parkinsonism, prior stroke, bipolar 1 disorder, COPD on 2L nasal cannula, with recent hospitalization from 11/19 to 11/29 from urinary tract infection who presented to the emergency room on 5/18 with 2-day history of weakness and falls and found to be COVID-positive.    Assessment & Plan    Assessment and Plan: * Generalized weakness Multifactorial, related to COVID infection superimposed on chronic comorbid conditions including peripheral neuropathy, parkinsonism, restless leg syndrome Therapy evaluation recommending SNF.  For transfer when insurance approves skilled nursing bed.   COVID-19 virus infection-completed isolation/resolved Probably contributed to patient's generalized weakness. Completed isolation by 10/03/2022. Patient is currently on 2 L of nasal cannula oxygen which is his baseline.  Chronic obstructive pulmonary disease (COPD) (HCC)/chronic respiratory failure on 2 L of nasal cannula oxygen at home No wheezing heard on exam at this time. Continue Advair with albuterol as needed  History of TIA (transient ischemic attack) Continue aspirin and statin. Generalized weakness.  Parkinsonian features Continue donepezil  Essential hypertension Blood pressure parameters are optimal  Coronary artery disease Patient currently denies any chest pain. EKG does not show any ischemic changes.  Continue with aspirin.  Bipolar affective disorder, current episode manic with psychotic symptoms ([place]) Received home Depakote and carbamazepine.  Hyperkalemia-resolved Second to dehydration and  AKI  AKI-resolved Secondary to dehydration   Estimated body mass index is 31.6 kg/m as calculated from the following:   Height as of this encounter: '5\' 6"'$  (1.676 m).   Weight as of this encounter: 88.8 kg.  Code Status: Full code DVT Prophylaxis:  SCDs/Lovenox   Level of Care: Level of care: Med-Surg Family Communication: None at bedside  Disposition Plan:     Awaiting insurance approval for skilled nursing  Procedures:  None.   Consultants:   None.   Antimicrobials:   Anti-infectives (From admission, onward)    Start     Dose/Rate Route Frequency Ordered Stop   09/25/22 2200  molnupiravir EUA (LAGEVRIO) capsule 800 mg        4 capsule Oral 2 times daily 09/25/22 1742 09/30/22 0928        Medications  Scheduled Meds:  aspirin EC  81 mg Oral Daily   atorvastatin  80 mg Oral Daily   carbamazepine  200 mg Oral BID   clopidogrel  75 mg Oral Daily   diltiazem  180 mg Oral Daily   divalproex  500 mg Oral Daily   donepezil  10 mg Oral QHS   enoxaparin (LOVENOX) injection  0.5 mg/kg Subcutaneous Q24H   feeding supplement  237 mL Oral BID BM   fluticasone  1 spray Each Nare Daily   mometasone-formoterol  2 puff Inhalation BID   montelukast  10 mg Oral QHS   pantoprazole  40 mg Oral BID   polyethylene glycol  17 g Oral QHS   timolol  1 drop Both Eyes BID   tiotropium  18 mcg Inhalation Daily   Continuous Infusions: PRN Meds:.acetaminophen, albuterol, chlorpheniramine-HYDROcodone, guaiFENesin-dextromethorphan, ondansetron **OR** ondansetron (ZOFRAN) IV    Subjective:   Patient with no complaints  Objective:   Vitals:   10/04/22 2025 10/05/22 0512 10/05/22 0821 10/05/22 1654  BP: 117/63 132/68 125/66 (!) 144/67  Pulse: 74 83 82 88  Resp: '14 16 18   '$ Temp: (!) 97.4 F (36.3 C) 97.7 F (36.5 C) (!) 97.4 F (36.3 C) (!) 97.5 F (36.4 C)  TempSrc:    Oral  SpO2: 96% 96% 93% 94%  Weight:      Height:        Intake/Output Summary (Last 24 hours) at  10/05/2022 1721 Last data filed at 10/04/2022 1734 Gross per 24 hour  Intake --  Output 300 ml  Net -300 ml    Filed Weights   09/24/22 0316  Weight: 88.8 kg     Exam General exam: Oriented x 2, no acute distress Respiratory system: Clear to auscultation bilaterally Cardiovascular system: Regular rate and rhythm, S1-S2 GI: soft, nontender, nondistended, positive bowel sounds Extremities: No clubbing or cyanosis or edema    Data Reviewed: No labs today  Radiology Studies: DG Chest Port 1 View  Result Date: 10/04/2022 CLINICAL DATA:  Tested positive for COVID 09/23/2022. Shortness of breath. Follow-up. EXAM: PORTABLE CHEST 1 VIEW COMPARISON:  AP chest 09/23/2022 FINDINGS: Cardiac silhouette and mediastinal contours are within normal limits. Mild chronic interstitial thickening is unchanged. No focal airspace opacity. No pleural effusion or pneumothorax. No acute skeletal abnormality. IMPRESSION: 1. No acute cardiopulmonary process. 2. Mild chronic interstitial thickening is unchanged. Electronically Signed   By: Yvonne Kendall M.D.   On: 10/04/2022 18:14     No charge for this visit  Annita Brod M.D. [facility] 10/05/2022, 5:21 PM  Available via Epic secure chat 7am-7pm After 7 pm, please refer to night coverage provider listed on amion.

## 2022-10-05 NOTE — TOC Progression Note (Signed)
Transition of Care [facility]) - Progression Note    Patient Details  Name: Bradley Bowers MRN: [id number] Date of Birth: Feb 14, 1946  [facility] CM/SW [place], [place] Phone Number: 10/05/2022, 12:23 PM  Clinical Narrative:   CSW spoke with Debbie in admissions at [facility] to make her aware patients authorization was approved. Debbie told CSW she was not aware of this patient and did not have him on her pending list. Jackelyn Poling said she can start the process on her side but the earliest patient can come is Tuesday 10/08/21. TOC will need to complete a new authorization.     Expected Discharge Plan: [place] Barriers to Discharge: Continued Medical Work up  Expected Discharge Plan and Services   Discharge Planning Services: CM Consult Post Acute Care Choice: [place] Living arrangements for the past 2 months: Single Family Home                 DME Arranged: N/A DME Agency: NA       HH Arranged: NA HH Agency: NA         Social Determinants of Health (SDOH) Interventions SDOH Screenings   Food Insecurity: No Food Insecurity (09/24/2022)  Housing: Low Risk  (09/24/2022)  Transportation Needs: No Transportation Needs (09/24/2022)  Utilities: Not At Risk (09/24/2022)  Alcohol Screen: Low Risk  (08/16/2017)  Depression (PHQ2-9): Low Risk  (04/05/2021)  Tobacco Use: Medium Risk (09/23/2022)    Readmission Risk Interventions     No data to display

## 2022-10-06 NOTE — Progress Notes (Signed)
[facility]                                                                               Bradley Bowers, is a 76 y.o. male, DOB - [birth year], [id number] Admit date - 09/23/2022    Outpatient Primary MD for the patient is Rusty Aus, MD  LOS - 13  days    Brief summary   76 y.o. male with medical history significant for CAD, HTN, peripheral neuropathy, parkinsonism, prior stroke, bipolar 1 disorder, COPD on 2L nasal cannula, with recent hospitalization from 11/19 to 11/29 from urinary tract infection who presented to the emergency room on 5/18 with 2-day history of weakness and falls and found to be COVID-positive.    Assessment & Plan    Assessment and Plan: * Generalized weakness Multifactorial, related to COVID infection superimposed on chronic comorbid conditions including peripheral neuropathy, parkinsonism, restless leg syndrome Therapy evaluation recommending SNF.  For transfer when insurance approves skilled nursing bed.   COVID-19 virus infection-completed isolation/resolved Probably contributed to patient's generalized weakness. Completed isolation by 10/03/2022. Patient is currently on 2 L of nasal cannula oxygen which is his baseline.  Chronic obstructive pulmonary disease (COPD) (HCC)/chronic respiratory failure on 2 L of nasal cannula oxygen at home No wheezing heard on exam at this time. Continue Advair with albuterol as needed  History of TIA (transient ischemic attack) Continue aspirin and statin. Generalized weakness.  Parkinsonian features Continue donepezil  Essential hypertension Blood pressure parameters are optimal  Coronary artery disease Patient currently denies any chest pain. EKG does not show any ischemic changes.  Continue with aspirin.  Bipolar affective disorder, current episode manic with psychotic symptoms ([place]) Received home Depakote and carbamazepine.  Hyperkalemia-resolved Second to dehydration and  AKI  AKI-resolved Secondary to dehydration   Estimated body mass index is 31.6 kg/m as calculated from the following:   Height as of this encounter: '5\' 6"'$  (1.676 m).   Weight as of this encounter: 88.8 kg.  Code Status: Full code DVT Prophylaxis:  SCDs/Lovenox   Level of Care: Level of care: Med-Surg Family Communication: None at bedside  Disposition Plan:     Awaiting insurance approval for skilled nursing  Procedures:  None.   Consultants:   None.   Antimicrobials:   Anti-infectives (From admission, onward)    Start     Dose/Rate Route Frequency Ordered Stop   09/25/22 2200  molnupiravir EUA (LAGEVRIO) capsule 800 mg        4 capsule Oral 2 times daily 09/25/22 1742 09/30/22 0928        Medications  Scheduled Meds:  aspirin EC  81 mg Oral Daily   atorvastatin  80 mg Oral Daily   carbamazepine  200 mg Oral BID   clopidogrel  75 mg Oral Daily   diltiazem  180 mg Oral Daily   divalproex  500 mg Oral Daily   donepezil  10 mg Oral QHS   enoxaparin (LOVENOX) injection  0.5 mg/kg Subcutaneous Q24H   feeding supplement  237 mL Oral BID BM   fluticasone  1 spray Each Nare Daily   mometasone-formoterol  2 puff Inhalation BID   montelukast  10 mg Oral QHS   pantoprazole  40 mg Oral BID   polyethylene glycol  17 g Oral QHS   timolol  1 drop Both Eyes BID   tiotropium  18 mcg Inhalation Daily   Continuous Infusions: PRN Meds:.acetaminophen, albuterol, chlorpheniramine-HYDROcodone, guaiFENesin-dextromethorphan, ondansetron **OR** ondansetron (ZOFRAN) IV    Subjective:   Patient asleep, no complaints  Objective:   Vitals:   10/05/22 1654 10/05/22 2026 10/06/22 0550 10/06/22 0733  BP: (!) 144/67 116/72 109/64 (!) 133/49  Pulse: 88 84 79 97  Resp:  '17 16 16  '$ Temp: (!) 97.5 F (36.4 C) 98.4 F (36.9 C) 98.5 F (36.9 C) 97.8 F (36.6 C)  TempSrc: Oral   Oral  SpO2: 94% 95% 98% 96%  Weight:      Height:        Intake/Output Summary (Last 24 hours) at  10/06/2022 1317 Last data filed at 10/05/2022 2119 Gross per 24 hour  Intake --  Output 501 ml  Net -501 ml    Filed Weights   09/24/22 0316  Weight: 88.8 kg     Exam General exam: Sleeping, no acute distress Respiratory system: Clear to auscultation bilaterally Cardiovascular system: Regular rate and rhythm, S1-S2 GI: soft, nontender, nondistended, positive bowel sounds Extremities: No clubbing or cyanosis or edema    Data Reviewed: No labs today  Radiology Studies: DG Chest Port 1 View  Result Date: 10/04/2022 CLINICAL DATA:  Tested positive for COVID 09/23/2022. Shortness of breath. Follow-up. EXAM: PORTABLE CHEST 1 VIEW COMPARISON:  AP chest 09/23/2022 FINDINGS: Cardiac silhouette and mediastinal contours are within normal limits. Mild chronic interstitial thickening is unchanged. No focal airspace opacity. No pleural effusion or pneumothorax. No acute skeletal abnormality. IMPRESSION: 1. No acute cardiopulmonary process. 2. Mild chronic interstitial thickening is unchanged. Electronically Signed   By: Yvonne Kendall M.D.   On: 10/04/2022 18:14     No charge for this visit  Annita Brod M.D. [facility] 10/06/2022, 1:17 PM  Available via Epic secure chat 7am-7pm After 7 pm, please refer to night coverage provider listed on amion.

## 2022-10-06 NOTE — Progress Notes (Signed)
Mobility Specialist - Progress Note   10/06/22 1247  Mobility  Activity Ambulated with assistance in hallway;Stood at bedside;Dangled on edge of bed  Level of Assistance Standby assist, set-up cues, supervision of patient - no hands on  Assistive Device Front wheel walker  Distance Ambulated (ft) 60 ft  Activity Response Tolerated well  Mobility Referral Yes  $Mobility charge 1 Mobility   Pt asleep on 2L upon arrival. Pt awakens to voice and motivated for OOB mobility. Pt completes bed mobility, STS, and ambulates to door and back X3. Pt is left in bed with needs in reach and bed alarm set.   Gretchen Short  Mobility Specialist  10/06/22 12:50 PM

## 2022-10-07 LAB — BASIC METABOLIC PANEL
Anion gap: 8 (ref 5–15)
BUN: 23 mg/dL (ref 8–23)
CO2: 30 mmol/L (ref 22–32)
Calcium: 8.7 mg/dL — ABNORMAL LOW (ref 8.9–10.3)
Chloride: 100 mmol/L (ref 98–111)
Creatinine, Ser: 1.06 mg/dL (ref 0.61–1.24)
GFR, Estimated: >60 mL/min (ref >60)
Glucose, Bld: 93 mg/dL (ref 70–99)
Potassium: 4.4 mmol/L (ref 3.5–5.1)
Sodium: 138 mmol/L (ref 135–145)

## 2022-10-07 LAB — CBC
HCT: 34.8 % — ABNORMAL LOW (ref 39.0–52.0)
Hemoglobin: 11.7 g/dL — ABNORMAL LOW (ref 13.0–17.0)
MCH: 30.8 pg (ref 26.0–34.0)
MCHC: 33.6 g/dL (ref 30.0–36.0)
MCV: 91.6 fL (ref 80.0–100.0)
Platelets: 286 10*3/uL (ref 150–400)
RBC: 3.8 MIL/uL — ABNORMAL LOW (ref 4.22–5.81)
RDW: 13 % (ref 11.5–15.5)
WBC: 12.3 10*3/uL — ABNORMAL HIGH (ref 4.0–10.5)
nRBC: 0 % (ref 0.0–0.2)

## 2022-10-07 LAB — MAGNESIUM: Magnesium: 2.2 mg/dL (ref 1.7–2.4)

## 2022-10-07 NOTE — Progress Notes (Signed)
[facility]                                                                               Bradley Bowers, is a 77 y.o. male, DOB - 1946/08/24, [id number] Admit date - 09/23/2022    Outpatient Primary MD for the patient is Rusty Aus, MD  LOS - 14  days    Brief summary   77 y.o. male with medical history significant for CAD, HTN, peripheral neuropathy, parkinsonism, prior stroke, bipolar 1 disorder, COPD on 2L nasal cannula, with recent hospitalization from 11/19 to 11/29 from urinary tract infection who presented to the emergency room on 5/18 with 2-day history of weakness and falls and found to be COVID-positive.    Assessment & Plan    Assessment and Plan: * Generalized weakness Multifactorial, related to COVID infection superimposed on chronic comorbid conditions including peripheral neuropathy, parkinsonism, restless leg syndrome Therapy evaluation recommending SNF.  For transfer when insurance approves skilled nursing bed.   COVID-19 virus infection-completed isolation/resolved Probably contributed to patient's generalized weakness. Completed isolation by 10/03/2022. Patient is currently on 2 L of nasal cannula oxygen which is his baseline.  Chronic obstructive pulmonary disease (COPD) (HCC)/chronic respiratory failure on 2 L of nasal cannula oxygen at home Stable at this time. Continue Advair with albuterol as needed  History of TIA (transient ischemic attack) Continue aspirin and statin. Generalized weakness.  Parkinsonian features Continue donepezil  Essential hypertension Blood pressure parameters are optimal  Coronary artery disease Patient currently denies any chest pain. EKG does not show any ischemic changes.  Continue with aspirin.  Bipolar affective disorder, current episode manic with psychotic symptoms ([place]) Received home Depakote and carbamazepine.  Hyperkalemia-resolved Second to dehydration and  AKI  AKI-resolved Secondary to dehydration   Estimated body mass index is 31.6 kg/m as calculated from the following:   Height as of this encounter: '5\' 6"'$  (1.676 m).   Weight as of this encounter: 88.8 kg.  Code Status: Full code DVT Prophylaxis:  SCDs/Lovenox   Level of Care: Level of care: Med-Surg Family Communication: Wife at bedside  Disposition Plan:     Awaiting insurance approval for skilled nursing  Procedures:  None.   Consultants:   None.   Antimicrobials:   Anti-infectives (From admission, onward)    Start     Dose/Rate Route Frequency Ordered Stop   09/25/22 2200  molnupiravir EUA (LAGEVRIO) capsule 800 mg        4 capsule Oral 2 times daily 09/25/22 1742 09/30/22 0928        Medications  Scheduled Meds:  aspirin EC  81 mg Oral Daily   atorvastatin  80 mg Oral Daily   carbamazepine  200 mg Oral BID   clopidogrel  75 mg Oral Daily   diltiazem  180 mg Oral Daily   divalproex  500 mg Oral Daily   donepezil  10 mg Oral QHS   enoxaparin (LOVENOX) injection  0.5 mg/kg Subcutaneous Q24H   feeding supplement  237 mL Oral BID BM   fluticasone  1 spray Each Nare Daily   mometasone-formoterol  2 puff Inhalation BID   montelukast  10 mg Oral QHS  pantoprazole  40 mg Oral BID   polyethylene glycol  17 g Oral QHS   timolol  1 drop Both Eyes BID   tiotropium  18 mcg Inhalation Daily   Continuous Infusions: PRN Meds:.acetaminophen, albuterol, chlorpheniramine-HYDROcodone, guaiFENesin-dextromethorphan, ondansetron **OR** ondansetron (ZOFRAN) IV    Subjective:   Patient with no complaints, feels weak in general  Objective:   Vitals:   10/06/22 1603 10/06/22 2023 10/07/22 0600 10/07/22 0819  BP: 132/62 (!) 127/57 (!) 114/30 124/66  Pulse: 86 82 89 89  Resp: '16 18 18 19  '$ Temp: 99.6 F (37.6 C) 97.7 F (36.5 C) 98.1 F (36.7 C) 97.6 F (36.4 C)  TempSrc:  Oral Oral Oral  SpO2: 90% 94%  93%  Weight:      Height:        Intake/Output Summary  (Last 24 hours) at 10/07/2022 1542 Last data filed at 10/07/2022 1426 Gross per 24 hour  Intake 474 ml  Output 300 ml  Net 174 ml    Filed Weights   09/24/22 0316  Weight: 88.8 kg     Exam General exam: Oriented to 2-3, no acute distress Respiratory system: Clear to auscultation bilaterally Cardiovascular system: Regular rate and rhythm, S1-S2 GI: soft, nontender, nondistended, positive bowel sounds Extremities: No clubbing or cyanosis or edema    Data Reviewed: No labs today  Radiology Studies: No results found.   No charge for this visit  Annita Brod M.D. [facility] 10/07/2022, 3:42 PM  Available via Epic secure chat 7am-7pm After 7 pm, please refer to night coverage provider listed on amion.

## 2022-10-07 NOTE — TOC Progression Note (Signed)
Transition of Care [facility]) - Progression Note    Patient Details  Name: Bradley Bowers MRN: [id number] Date of Birth: [birth year]  [facility] of [facility]) CM/SW Contact  Gerilyn Pilgrim, LCSW Phone Number: 10/07/2022, 9:13 AM  Clinical Narrative:   [employer] closed today. Pt auth expires 10/07/22. However, pt may be eligible for [employer] 24 hour grace period. TOC will call on 10/08/2022 to see if this applies to pt admitting to [facility] on 10/08/2022. If this does not apply, a new auth would need to be obtained.    Expected Discharge Plan: [place] Barriers to Discharge: Continued Medical Work up  Expected Discharge Plan and Services   Discharge Planning Services: CM Consult Post Acute Care Choice: [place] Living arrangements for the past 2 months: Single Family Home                 DME Arranged: N/A DME Agency: NA       HH Arranged: NA HH Agency: NA         Social Determinants of Health (SDOH) Interventions SDOH Screenings   Food Insecurity: No Food Insecurity (09/24/2022)  Housing: Low Risk  (09/24/2022)  Transportation Needs: No Transportation Needs (09/24/2022)  Utilities: Not At Risk (09/24/2022)  Alcohol Screen: Low Risk  (08/16/2017)  Depression (PHQ2-9): Low Risk  (04/05/2021)  Tobacco Use: Medium Risk (09/23/2022)    Readmission Risk Interventions     No data to display

## 2022-10-08 DIAGNOSIS — M6281 Muscle weakness (generalized): Secondary | ICD-10-CM | POA: Diagnosis not present

## 2022-10-08 DIAGNOSIS — J449 Chronic obstructive pulmonary disease, unspecified: Secondary | ICD-10-CM | POA: Diagnosis not present

## 2022-10-08 DIAGNOSIS — R531 Weakness: Secondary | ICD-10-CM | POA: Diagnosis not present

## 2022-10-08 DIAGNOSIS — G20B2 Parkinson's disease with dyskinesia, with fluctuations: Secondary | ICD-10-CM | POA: Diagnosis not present

## 2022-10-08 DIAGNOSIS — G2581 Restless legs syndrome: Secondary | ICD-10-CM | POA: Diagnosis not present

## 2022-10-08 DIAGNOSIS — R051 Acute cough: Secondary | ICD-10-CM | POA: Diagnosis not present

## 2022-10-08 DIAGNOSIS — R29818 Other symptoms and signs involving the nervous system: Secondary | ICD-10-CM | POA: Diagnosis not present

## 2022-10-08 DIAGNOSIS — J432 Centrilobular emphysema: Secondary | ICD-10-CM | POA: Diagnosis not present

## 2022-10-08 DIAGNOSIS — W19XXXA Unspecified fall, initial encounter: Secondary | ICD-10-CM | POA: Diagnosis not present

## 2022-10-08 DIAGNOSIS — Z8673 Personal history of transient ischemic attack (TIA), and cerebral infarction without residual deficits: Secondary | ICD-10-CM | POA: Diagnosis not present

## 2022-10-08 DIAGNOSIS — F312 Bipolar disorder, current episode manic severe with psychotic features: Secondary | ICD-10-CM | POA: Diagnosis not present

## 2022-10-08 DIAGNOSIS — R0989 Other specified symptoms and signs involving the circulatory and respiratory systems: Secondary | ICD-10-CM | POA: Diagnosis not present

## 2022-10-08 DIAGNOSIS — I10 Essential (primary) hypertension: Secondary | ICD-10-CM | POA: Diagnosis not present

## 2022-10-08 DIAGNOSIS — U071 COVID-19: Secondary | ICD-10-CM | POA: Diagnosis not present

## 2022-10-08 DIAGNOSIS — Z7401 Bed confinement status: Secondary | ICD-10-CM | POA: Diagnosis not present

## 2022-10-08 DIAGNOSIS — F319 Bipolar disorder, unspecified: Secondary | ICD-10-CM | POA: Diagnosis not present

## 2022-10-08 DIAGNOSIS — I2510 Atherosclerotic heart disease of native coronary artery without angina pectoris: Secondary | ICD-10-CM | POA: Diagnosis not present

## 2022-10-08 DIAGNOSIS — J438 Other emphysema: Secondary | ICD-10-CM | POA: Diagnosis not present

## 2022-10-08 NOTE — Care Management Important Message (Signed)
Important Message  Patient Details  Name: Bradley Bowers MRN: [id number] Date of Birth: 1946/02/11   Medicare Important Message Given:  Yes     Juliann Pulse A Quincey Quesinberry 10/08/2022, 10:26 AM

## 2022-10-08 NOTE — Progress Notes (Signed)
Physical Therapy Treatment Patient Details Name: Bradley Bowers MRN: [id number] DOB: 1946/04/21 Today's Date: 10/08/2022   History of Present Illness 77 y/o male presented to ED on 09/23/22 for weakness, falls, and "feeling sick". Found to be COVID+. PMH: bipolar 1 disorder, CVA, hypertension, Parkinson's, hyperlipidemia, COPD, peripheral neuropathy    PT Comments    Pt received supine in bed agreeable to PT services. Remains on baseline 2 L/min without complaint with breathing. Focus of session on improving endurance with multiple, short distances walking bouts without seated rest breaks.  Pt performing 4x40' bouts with RW at supervision level but did have x2 minor, L lateral LOB into wall relying on PT and wall support to correct balance. Post gait, seated rest SPO2 reading 88%. Required seated rest and PLB to return to > 90% after ~ 1 minute. Pt performing x5 STS to improve LE strength with increased WOB performing in ~ 13 seconds which is WNL for pt demographic. Will plan for STR at d/c due to multiple, frequent falls at home and due to LOB during PT session. Pt making steady progress towards PLOF otherwise. All needs in reach.     Recommendations for follow up therapy are one component of a multi-disciplinary discharge planning process, led by the attending physician.  Recommendations may be updated based on patient status, additional functional criteria and insurance authorization.  Follow Up Recommendations  Skilled nursing-short term rehab (<3 hours/day) Can patient physically be transported by private vehicle: Yes   Assistance Recommended at Discharge Frequent or constant Supervision/Assistance  Patient can return home with the following A little help with bathing/dressing/bathroom;Assistance with cooking/housework;Assist for transportation;Help with stairs or ramp for entrance;A little help with walking and/or transfers   Equipment Recommendations  None recommended by PT     Recommendations for Other Services       Precautions / Restrictions Precautions Precaution Comments: watch O2 Restrictions Weight Bearing Restrictions: No     Mobility  Bed Mobility Overal bed mobility: Modified Independent               Patient Response: Cooperative  Transfers Overall transfer level: Needs assistance Equipment used: Rolling walker (2 wheels) Transfers: Sit to/from Stand Sit to Stand: Supervision           General transfer comment: Safe hand placement    Ambulation/Gait Ambulation/Gait assistance: Supervision Gait Distance (Feet): 160 Feet (4x40' bout with RW) Assistive device: Rolling walker (2 wheels) Gait Pattern/deviations: Step-through pattern       General Gait Details: ambulating at supervision level with safe RW turns. Pt did have x2 bouts of lateral LOB into wall needing PT and wall to correct LOB.   Stairs             Wheelchair Mobility    Modified Rankin (Stroke Patients Only)       Balance Overall balance assessment: Needs assistance, History of Falls Sitting-balance support: No upper extremity supported, Feet supported Sitting balance-[name]: Good     Standing balance support: Bilateral upper extremity supported, Reliant on assistive device for balance Standing balance-[name]: Fair                              Cognition Arousal/Alertness: Awake/alert Behavior During Therapy: WFL for tasks assessed/performed Overall Cognitive Status: Within Functional Limits for tasks assessed  Exercises Other Exercises Other Exercises: Benefits of OOB mobility to return to PLOF.    General Comments General comments (skin integrity, edema, etc.): desat to 88% post ambulation on 2 L/min via [place]      Pertinent Vitals/Pain Pain Assessment Pain Assessment: No/denies pain    Home Living                          Prior Function             PT Goals (current goals can now be found in the care plan section)      Frequency    Min 2X/week      PT Plan Current plan remains appropriate    Co-evaluation              AM-PAC PT "6 Clicks" Mobility   Outcome Measure  Help needed turning from your back to your side while in a flat bed without using bedrails?: A Little Help needed moving from lying on your back to sitting on the side of a flat bed without using bedrails?: A Little Help needed moving to and from a bed to a chair (including a wheelchair)?: A Little Help needed standing up from a chair using your arms (e.g., wheelchair or bedside chair)?: A Little Help needed to walk in hospital room?: A Little Help needed climbing 3-5 steps with a railing? : A Lot 6 Click Score: 17    End of Session Equipment Utilized During Treatment: Gait belt;Oxygen Activity Tolerance: Patient tolerated treatment well Patient left: in bed;with call bell/phone within reach;with bed alarm set Nurse Communication: Mobility status PT Visit Diagnosis: Muscle weakness (generalized) (M62.81);Unsteadiness on feet (R26.81);Repeated falls (R29.6);History of falling (Z91.81);Other abnormalities of gait and mobility (R26.89)     Time: 0946-1000 PT Time Calculation (min) (ACUTE ONLY): 14 min  Charges:  $Therapeutic Exercise: 8-22 mins                     Salem Caster. Fairly IV, PT, DPT Physical Therapist- [facility]  10/08/2022, 10:50 AM

## 2022-10-08 NOTE — Discharge Summary (Addendum)
Physician Discharge Summary   Patient: Bradley Bowers MRN: [id number] DOB: 1945-12-31  Admit date:     09/23/2022  Discharge date: 10/08/2021  Discharge Physician: Annita Brod   PCP: Rusty Aus, MD   Recommendations at discharge:  Please follow up with PCp in one week.  Please check cbc and bmp in one week.    Discharge Diagnoses: Principal Problem:   Generalized weakness Active Problems:   COVID-19 virus infection   Chronic obstructive pulmonary disease (COPD) (HCC)   Bipolar affective disorder, current episode manic with psychotic symptoms (HCC)   Coronary artery disease   Essential hypertension   Weakness   Parkinsonian features   History of TIA (transient ischemic attack)    Hospital Course:   Bradley Bowers is a 77 y.o. male with medical history significant for CAD, HTN, peripheral neuropathy, parkinsonism, prior stroke, bipolar 1 disorder, COPD on 2L at baseline, hospitalized recently from 11/19 to 11/29 with a UTI presenting with abdominal pain and difficulty ambulating who was brought to the ED with a 2-day history of generalized weakness resulting in 2 falls.  Patient was found to be COVID-19 positive.     Assessment and Plan:  Generalized weakness Multifactorial, related to COVID infection superimposed on chronic comorbid conditions including peripheral neuropathy, parkinsonism, restless leg syndrome Therapy evaluation recommending SNF.      COVID-19 virus infection-completed isolation/resolved Probably contributed to patient's generalized weakness. Completed isolation by 10/03/2022. Patient is currently on 2 L of nasal cannula oxygen which is his baseline.   Chronic obstructive pulmonary disease (COPD) (HCC)/chronic respiratory failure on 2 L of nasal cannula oxygen at home No wheezing heard on exam at this time. Continue Advair with albuterol as needed   History of TIA (transient ischemic attack) Continue aspirin and statin. Generalized  weakness.   Parkinsonian features Continue donepezil   Essential hypertension Blood pressure parameters are optimal.    Coronary artery disease Patient currently denies any chest pain. EKG does not show any ischemic changes.  Continue with aspirin, plavix and statin.    Bipolar affective disorder, current episode manic with psychotic symptoms ([place]) Received home Depakote and carbamazepine.   Hyperkalemia-resolved Second to dehydration and AKI   AKI-resolved Secondary to dehydration      Estimated body mass index is 31.6 kg/m as calculated from the following:   Height as of this encounter: '5\' 6"'$  (1.676 m).   Weight as of this encounter: 88.8 kg.       Consultants: none.  Procedures performed: none.   Disposition: Skilled nursing facility Diet recommendation:  Cardiac diet DISCHARGE MEDICATION: Allergies as of 10/08/2022   No Known Allergies      Medication List     STOP taking these medications    Advair Diskus 100-50 MCG/ACT Aepb Generic drug: fluticasone-salmeterol Replaced by: mometasone-formoterol 100-5 MCG/ACT Aero   hydrochlorothiazide 12.5 MG capsule Commonly known as: MICROZIDE   lisinopril 2.5 MG tablet Commonly known as: ZESTRIL   temazepam 15 MG capsule Commonly known as: RESTORIL       TAKE these medications    albuterol 108 (90 Base) MCG/ACT inhaler Commonly known as: VENTOLIN HFA Inhale 1-2 puffs into the lungs every 4 (four) hours as needed.   aspirin EC 81 MG tablet Take 81 mg by mouth daily.   atorvastatin 80 MG tablet Commonly known as: LIPITOR Take 80 mg by mouth daily.   carbamazepine 200 MG tablet Commonly known as: TEGRETOL Take 1 tablet by mouth 2 (two) times daily.  cholecalciferol 25 MCG (1000 UNIT) tablet Commonly known as: VITAMIN D3 Take 1,000 Units by mouth daily.   clopidogrel 75 MG tablet Commonly known as: PLAVIX Take 75 mg by mouth daily.   cyanocobalamin 1000 MCG tablet Commonly known as: VITAMIN  B12 Take 1,000 mcg by mouth daily.   diltiazem 180 MG 24 hr capsule Commonly known as: CARDIZEM CD Take 180 mg by mouth daily.   divalproex 500 MG 24 hr tablet Commonly known as: DEPAKOTE ER Take 500 mg by mouth daily.   donepezil 10 MG tablet Commonly known as: ARICEPT Take 1 tablet by mouth at bedtime.   feeding supplement Liqd Take 237 mLs by mouth 2 (two) times daily between meals.   fluticasone 50 MCG/ACT nasal spray Commonly known as: FLONASE Place 1 spray into both nostrils daily.   mometasone-formoterol 100-5 MCG/ACT Aero Commonly known as: DULERA Inhale 2 puffs into the lungs 2 (two) times daily. Replaces: Advair Diskus 100-50 MCG/ACT Aepb   montelukast 10 MG tablet Commonly known as: SINGULAIR Take 10 mg by mouth at bedtime.   pantoprazole 40 MG tablet Commonly known as: PROTONIX Take 40 mg by mouth 2 (two) times daily.   polyethylene glycol 17 g packet Commonly known as: MIRALAX / GLYCOLAX Take 17 g by mouth daily as needed.   spironolactone 25 MG tablet Commonly known as: ALDACTONE Take 1 tablet by mouth daily.   timolol 0.5 % ophthalmic solution Commonly known as: TIMOPTIC 1 drop 2 (two) times daily.   tiotropium 18 MCG inhalation capsule Commonly known as: SPIRIVA Place 18 mcg into inhaler and inhale daily.   torsemide 20 MG tablet Commonly known as: DEMADEX Take 20 mg by mouth daily.   Travatan Z 0.004 % Soln ophthalmic solution Generic drug: Travoprost (BAK Free) Place 1 drop into both eyes at bedtime.        Contact information for after-discharge care     Destination     [facility] [place] [facility] .   Service: Skilled Nursing Contact information: [address] [place] [phone]                    Discharge Exam: Danley Danker Weights   09/24/22 0316  Weight: 88.8 kg   General exam: Oriented x 2-3, no acute distress Cardiovascular: Regular rate and rhythm, S1-S2 Lungs:  Clear to auscultation bilaterally   Condition at discharge: fair  The results of significant diagnostics from this hospitalization (including imaging, microbiology, ancillary and laboratory) are listed below for reference.   Imaging Studies: DG Chest Port 1 View  Result Date: 10/04/2022 CLINICAL DATA:  Tested positive for COVID 09/23/2022. Shortness of breath. Follow-up. EXAM: PORTABLE CHEST 1 VIEW COMPARISON:  AP chest 09/23/2022 FINDINGS: Cardiac silhouette and mediastinal contours are within normal limits. Mild chronic interstitial thickening is unchanged. No focal airspace opacity. No pleural effusion or pneumothorax. No acute skeletal abnormality. IMPRESSION: 1. No acute cardiopulmonary process. 2. Mild chronic interstitial thickening is unchanged. Electronically Signed   By: Yvonne Kendall M.D.   On: 10/04/2022 18:14   DG Chest Port 1 View  Result Date: 09/23/2022 CLINICAL DATA:  Questionable sepsis EXAM: PORTABLE CHEST 1 VIEW COMPARISON:  Chest x-ray 08/25/2022 FINDINGS: The heart size and mediastinal contours are within normal limits. Both lungs are clear. The visualized skeletal structures are unremarkable. IMPRESSION: No active disease. Electronically Signed   By: Ronney Asters M.D.   On: 09/23/2022 21:55   CT Head Wo Contrast  Result Date:  09/23/2022 CLINICAL DATA:  Head trauma, minor EXAM: CT HEAD WITHOUT CONTRAST TECHNIQUE: Contiguous axial images were obtained from the base of the skull through the vertex without intravenous contrast. RADIATION DOSE REDUCTION: This exam was performed according to the departmental dose-optimization program which includes automated exposure control, adjustment of the mA and/or kV according to patient size and/or use of iterative reconstruction technique. COMPARISON:  CT head dated August 25, 2022 FINDINGS: Brain: No evidence of acute infarction, hemorrhage, hydrocephalus, extra-axial collection or mass lesion/mass effect. Prominence of the  ventricles and sulci secondary to moderate generalized cerebral volume loss, unchanged. Patchy area of low-attenuation of the periventricular white matter presumed chronic microvascular ischemic changes. Vascular: No hyperdense vessel or unexpected calcification. Skull: Normal. Negative for fracture or focal lesion. Sinuses/Orbits: No acute finding.  Bilateral cataract surgery. Other: None. IMPRESSION: 1. No acute intracranial abnormality. 2. Chronic microvascular ischemic changes of the periventricular white matter. 3. Moderate generalized cerebral volume loss, unchanged. Electronically Signed   By: Keane Police D.O.   On: 09/23/2022 21:40   MR ABDOMEN WWO CONTRAST  Result Date: 09/15/2022 CLINICAL DATA:  No clinical history provided. Upper pole right renal cyst noted on prior CT scan. EXAM: MRI ABDOMEN WITHOUT AND WITH CONTRAST TECHNIQUE: Multiplanar multisequence MR imaging of the abdomen was performed both before and after the administration of intravenous contrast. CONTRAST:  20m GADAVIST GADOBUTROL 1 MMOL/ML IV SOLN COMPARISON:  CT scan 08/25/2022 FINDINGS: Lower chest: The lung bases are clear acute process. No pulmonary lesions, pleural or pericardial effusion. Hepatobiliary: No hepatic lesions or intrahepatic biliary dilatation. Single gallstone noted in the gallbladder. No findings for acute cholecystitis. Normal caliber and course of the common bile duct. Pancreas:  No mass, inflammation or ductal dilatation. Spleen:  Normal size. No focal lesions. Adrenals/Urinary Tract:  The adrenal glands are normal. Multiple bilateral Bosniak 1 and Bosniak 2 cysts. Small bilateral hemorrhagic cysts are noted along with some small septated cysts. Cluster of exophytic cystic lesions involving the upper pole region right kidney posteriorly do not show any contrast enhancement. I suspect these are collapsed/ruptured cysts. No worrisome renal lesions requiring follow-up. Stomach/Bowel: The stomach, duodenum,  visualized small bowel and visualized colon are unremarkable. Vascular/Lymphatic: The aorta and branch vessels are patent. The major venous structures are patent. No mesenteric or retroperitoneal mass or adenopathy. Other:  No ascites or abdominal wall hernia. Musculoskeletal: No significant bony findings. Scattered spinal hemangiomas. IMPRESSION: 1. Multiple bilateral Bosniak 1 and Bosniak 2 cysts. Cluster of small exophytic cystic lesions involving the upper pole region right kidney posteriorly do not show any contrast enhancement and are likely collapsed/ruptured cysts. No worrisome renal lesions requiring follow-up. 2. Single gallstone in the gallbladder. No findings for acute cholecystitis. 3. No acute abdominal findings, mass lesions or adenopathy. Electronically Signed   By: PMarijo SanesM.D.   On: 09/15/2022 11:44    Microbiology: Results for orders placed or performed during the hospital encounter of 09/23/22  Resp panel by RT-PCR (RSV, Flu A&B, Covid)     Status: Abnormal   Collection Time: 09/23/22  9:00 PM   Specimen: Nasal Swab  Result Value Ref Range Status   SARS Coronavirus 2 by RT PCR POSITIVE (A) NEGATIVE Final    Comment: (NOTE) SARS-CoV-2 target nucleic acids are DETECTED.  The SARS-CoV-2 RNA is generally detectable in upper respiratory specimens during the acute phase of infection. Positive results are indicative of the presence of the identified virus, but do not rule out bacterial infection or co-infection with  other pathogens not detected by the test. Clinical correlation with patient history and other diagnostic information is necessary to determine patient infection status. The expected result is Negative.  Fact Sheet for Patients: EntrepreneurPulse.com.au  Fact Sheet for Healthcare Providers: IncredibleEmployment.be  This test is not yet approved or cleared by the [place] FDA and  has been authorized for detection and/or  diagnosis of SARS-CoV-2 by FDA under an Emergency Use Authorization (EUA).  This EUA will remain in effect (meaning this test can be used) for the duration of  the COVID-19 declaration under Section 564(b)(1) of the A ct, 21 U.S.C. section 360bbb-3(b)(1), unless the authorization is terminated or revoked sooner.     Influenza A by PCR NEGATIVE NEGATIVE Final   Influenza B by PCR NEGATIVE NEGATIVE Final    Comment: (NOTE) The Xpert Xpress SARS-CoV-2/FLU/RSV plus assay is intended as an aid in the diagnosis of influenza from Nasopharyngeal swab specimens and should not be used as a sole basis for treatment. Nasal washings and aspirates are unacceptable for Xpert Xpress SARS-CoV-2/FLU/RSV testing.  Fact Sheet for Patients: EntrepreneurPulse.com.au  Fact Sheet for Healthcare Providers: IncredibleEmployment.be  This test is not yet approved or cleared by the [place] FDA and has been authorized for detection and/or diagnosis of SARS-CoV-2 by FDA under an Emergency Use Authorization (EUA). This EUA will remain in effect (meaning this test can be used) for the duration of the COVID-19 declaration under Section 564(b)(1) of the Act, 21 U.S.C. section 360bbb-3(b)(1), unless the authorization is terminated or revoked.     Resp Syncytial Virus by PCR NEGATIVE NEGATIVE Final    Comment: (NOTE) Fact Sheet for Patients: EntrepreneurPulse.com.au  Fact Sheet for Healthcare Providers: IncredibleEmployment.be  This test is not yet approved or cleared by the [place] FDA and has been authorized for detection and/or diagnosis of SARS-CoV-2 by FDA under an Emergency Use Authorization (EUA). This EUA will remain in effect (meaning this test can be used) for the duration of the COVID-19 declaration under Section 564(b)(1) of the Act, 21 U.S.C. section 360bbb-3(b)(1), unless the authorization is terminated  or revoked.  Performed at [facility], [address], [place], [place] [address]   Urine Culture     Status: None   Collection Time: 09/23/22  9:29 PM   Specimen: Urine, Random  Result Value Ref Range Status   Specimen Description   Final    URINE, RANDOM Performed at [facility], [address] [place], [place] [address]    Special Requests   Final    NONE Performed at [facility], [address], [place], [place] [address]    Culture   Final    NO GROWTH Performed at [facility], [place] [address] [place], [place] [address]    Report Status 09/25/2022 FINAL  Final  C Difficile Quick Screen w PCR reflex     Status: None   Collection Time: 09/25/22 12:44 PM   Specimen: STOOL  Result Value Ref Range Status   C Diff antigen NEGATIVE NEGATIVE Final   C Diff toxin NEGATIVE NEGATIVE Final   C Diff interpretation No C. difficile detected.  Final    Comment: Performed at [facility], [address] [place], [place] [address]    Labs: CBC: Recent Labs  Lab 10/01/22 1234 10/02/22 0545 10/04/22 0403 10/07/22 0457  WBC 10.1 10.7* 11.8* 12.3*  HGB 13.8 12.2* 12.7* 11.7*  HCT 41.2 36.9* 37.2* 34.8*  MCV 92.0 90.4 89.9 91.6  PLT 249 221 285 292   Basic Metabolic Panel: Recent Labs  Lab 10/01/22 1234 10/02/22 0545 10/04/22 0403 10/07/22 0457  NA 139 139 138 138  K 5.4* 4.5 4.4 4.4  CL 99 101 102 100  CO2 33* '31 27 30  '$ GLUCOSE 126* 95 95 93  BUN '22 21 20 23  '$ CREATININE 1.04 0.89 0.97 1.06  CALCIUM 9.5 9.0 8.7* 8.7*  MG 2.4 2.3 2.3 2.2   Liver Function Tests: No results for input(s): "AST", "ALT", "ALKPHOS", "BILITOT", "PROT", "ALBUMIN" in the last 168 hours. CBG: Recent Labs  Lab 10/05/22 1721  GLUCAP 128*    Discharge time spent: Less than 30 minutes  Signed: Annita Brod, MD [facility] 10/08/2022

## 2022-10-08 NOTE — TOC Transition Note (Signed)
Transition of Care [facility]) - CM/SW Discharge Note   Patient Details  Name: Bradley Bowers MRN: [id number] Date of Birth: 1946/09/18  Transition of Care [facility]) CM/SW Contact:  Gerilyn Pilgrim, LCSW Phone Number: 10/08/2022, 10:42 AM   Clinical Narrative:   SW spoke with [facility] about patients auth expiring on 1/1. [employer] stated that they have a 6 day grace period and that the patients [name] is good until 1/2. CSW notified deborah at [facility]. RN given number for report. Pt reportedly could go by car but wife would like [facility] arranged. Wife informed she may be billed for this transport if pt is not found to have criteria for ambulance. Wife still states she would like to proceed with [facility]. Transport arranged there are 4 others ahead of this patient. RN notified.     Final next level of care: Skilled Nursing Facility Barriers to Discharge: Barriers Resolved   Patient Goals and CMS Choice CMS Medicare.gov Compare Post Acute Care list provided to:: Patient Choice offered to / list presented to : Patient  Discharge Placement                  Patient to be transferred to facility by: [facility] Name of family member notified: [name] Patient and family notified of of transfer: 10/08/22  Discharge Plan and Services Additional resources added to the After Visit Summary for     Discharge Planning Services: CM Consult Post Acute Care Choice: [place]          DME Arranged: N/A DME Agency: NA       HH Arranged: NA HH Agency: NA        Social Determinants of Health (SDOH) Interventions SDOH Screenings   Food Insecurity: No Food Insecurity (09/24/2022)  Housing: Low Risk  (09/24/2022)  Transportation Needs: No Transportation Needs (09/24/2022)  Utilities: Not At Risk (09/24/2022)  Alcohol Screen: Low Risk  (08/16/2017)  Depression (PHQ2-9): Low Risk  (04/05/2021)  Tobacco Use: Medium Risk (09/23/2022)     Readmission Risk Interventions     No data to  display

## 2022-10-09 DIAGNOSIS — F319 Bipolar disorder, unspecified: Secondary | ICD-10-CM | POA: Diagnosis not present

## 2022-10-09 DIAGNOSIS — J449 Chronic obstructive pulmonary disease, unspecified: Secondary | ICD-10-CM | POA: Diagnosis not present

## 2022-10-09 DIAGNOSIS — R29818 Other symptoms and signs involving the nervous system: Secondary | ICD-10-CM | POA: Diagnosis not present

## 2022-10-09 DIAGNOSIS — R531 Weakness: Secondary | ICD-10-CM | POA: Diagnosis not present

## 2022-10-09 DIAGNOSIS — I2510 Atherosclerotic heart disease of native coronary artery without angina pectoris: Secondary | ICD-10-CM | POA: Diagnosis not present

## 2022-10-09 DIAGNOSIS — U071 COVID-19: Secondary | ICD-10-CM | POA: Diagnosis not present

## 2022-10-09 DIAGNOSIS — G2581 Restless legs syndrome: Secondary | ICD-10-CM | POA: Diagnosis not present

## 2022-10-09 DIAGNOSIS — I10 Essential (primary) hypertension: Secondary | ICD-10-CM | POA: Diagnosis not present

## 2022-10-17 DIAGNOSIS — J449 Chronic obstructive pulmonary disease, unspecified: Secondary | ICD-10-CM | POA: Diagnosis not present

## 2022-10-17 DIAGNOSIS — R0989 Other specified symptoms and signs involving the circulatory and respiratory systems: Secondary | ICD-10-CM | POA: Diagnosis not present

## 2022-10-17 DIAGNOSIS — R531 Weakness: Secondary | ICD-10-CM | POA: Diagnosis not present

## 2022-10-21 DIAGNOSIS — I10 Essential (primary) hypertension: Secondary | ICD-10-CM | POA: Diagnosis not present

## 2022-10-21 DIAGNOSIS — J9690 Respiratory failure, unspecified, unspecified whether with hypoxia or hypercapnia: Secondary | ICD-10-CM | POA: Diagnosis not present

## 2022-10-21 DIAGNOSIS — I2510 Atherosclerotic heart disease of native coronary artery without angina pectoris: Secondary | ICD-10-CM | POA: Diagnosis not present

## 2022-10-21 DIAGNOSIS — S61411D Laceration without foreign body of right hand, subsequent encounter: Secondary | ICD-10-CM | POA: Diagnosis not present

## 2022-10-21 DIAGNOSIS — F319 Bipolar disorder, unspecified: Secondary | ICD-10-CM | POA: Diagnosis not present

## 2022-10-21 DIAGNOSIS — E785 Hyperlipidemia, unspecified: Secondary | ICD-10-CM | POA: Diagnosis not present

## 2022-10-21 DIAGNOSIS — J4489 Other specified chronic obstructive pulmonary disease: Secondary | ICD-10-CM | POA: Diagnosis not present

## 2022-10-21 DIAGNOSIS — K219 Gastro-esophageal reflux disease without esophagitis: Secondary | ICD-10-CM | POA: Diagnosis not present

## 2022-10-21 DIAGNOSIS — H409 Unspecified glaucoma: Secondary | ICD-10-CM | POA: Diagnosis not present

## 2022-10-22 DIAGNOSIS — I2510 Atherosclerotic heart disease of native coronary artery without angina pectoris: Secondary | ICD-10-CM | POA: Diagnosis not present

## 2022-10-22 DIAGNOSIS — J4489 Other specified chronic obstructive pulmonary disease: Secondary | ICD-10-CM | POA: Diagnosis not present

## 2022-10-22 DIAGNOSIS — K219 Gastro-esophageal reflux disease without esophagitis: Secondary | ICD-10-CM | POA: Diagnosis not present

## 2022-10-22 DIAGNOSIS — F319 Bipolar disorder, unspecified: Secondary | ICD-10-CM | POA: Diagnosis not present

## 2022-10-22 DIAGNOSIS — J9690 Respiratory failure, unspecified, unspecified whether with hypoxia or hypercapnia: Secondary | ICD-10-CM | POA: Diagnosis not present

## 2022-10-22 DIAGNOSIS — I10 Essential (primary) hypertension: Secondary | ICD-10-CM | POA: Diagnosis not present

## 2022-10-22 DIAGNOSIS — S61411D Laceration without foreign body of right hand, subsequent encounter: Secondary | ICD-10-CM | POA: Diagnosis not present

## 2022-10-22 DIAGNOSIS — E785 Hyperlipidemia, unspecified: Secondary | ICD-10-CM | POA: Diagnosis not present

## 2022-10-22 DIAGNOSIS — H409 Unspecified glaucoma: Secondary | ICD-10-CM | POA: Diagnosis not present

## 2022-10-23 DIAGNOSIS — J441 Chronic obstructive pulmonary disease with (acute) exacerbation: Secondary | ICD-10-CM | POA: Diagnosis not present

## 2022-10-23 DIAGNOSIS — J9601 Acute respiratory failure with hypoxia: Secondary | ICD-10-CM | POA: Diagnosis not present

## 2022-10-25 DIAGNOSIS — J9690 Respiratory failure, unspecified, unspecified whether with hypoxia or hypercapnia: Secondary | ICD-10-CM | POA: Diagnosis not present

## 2022-10-25 DIAGNOSIS — I2510 Atherosclerotic heart disease of native coronary artery without angina pectoris: Secondary | ICD-10-CM | POA: Diagnosis not present

## 2022-10-25 DIAGNOSIS — I10 Essential (primary) hypertension: Secondary | ICD-10-CM | POA: Diagnosis not present

## 2022-10-25 DIAGNOSIS — K219 Gastro-esophageal reflux disease without esophagitis: Secondary | ICD-10-CM | POA: Diagnosis not present

## 2022-10-25 DIAGNOSIS — H409 Unspecified glaucoma: Secondary | ICD-10-CM | POA: Diagnosis not present

## 2022-10-25 DIAGNOSIS — F319 Bipolar disorder, unspecified: Secondary | ICD-10-CM | POA: Diagnosis not present

## 2022-10-25 DIAGNOSIS — E785 Hyperlipidemia, unspecified: Secondary | ICD-10-CM | POA: Diagnosis not present

## 2022-10-25 DIAGNOSIS — J4489 Other specified chronic obstructive pulmonary disease: Secondary | ICD-10-CM | POA: Diagnosis not present

## 2022-10-25 DIAGNOSIS — S61411D Laceration without foreign body of right hand, subsequent encounter: Secondary | ICD-10-CM | POA: Diagnosis not present

## 2022-10-30 DIAGNOSIS — E785 Hyperlipidemia, unspecified: Secondary | ICD-10-CM | POA: Diagnosis not present

## 2022-10-30 DIAGNOSIS — J4489 Other specified chronic obstructive pulmonary disease: Secondary | ICD-10-CM | POA: Diagnosis not present

## 2022-10-30 DIAGNOSIS — J9690 Respiratory failure, unspecified, unspecified whether with hypoxia or hypercapnia: Secondary | ICD-10-CM | POA: Diagnosis not present

## 2022-10-30 DIAGNOSIS — I2510 Atherosclerotic heart disease of native coronary artery without angina pectoris: Secondary | ICD-10-CM | POA: Diagnosis not present

## 2022-10-30 DIAGNOSIS — K219 Gastro-esophageal reflux disease without esophagitis: Secondary | ICD-10-CM | POA: Diagnosis not present

## 2022-10-30 DIAGNOSIS — F319 Bipolar disorder, unspecified: Secondary | ICD-10-CM | POA: Diagnosis not present

## 2022-10-30 DIAGNOSIS — S61411D Laceration without foreign body of right hand, subsequent encounter: Secondary | ICD-10-CM | POA: Diagnosis not present

## 2022-10-30 DIAGNOSIS — H409 Unspecified glaucoma: Secondary | ICD-10-CM | POA: Diagnosis not present

## 2022-10-30 DIAGNOSIS — I10 Essential (primary) hypertension: Secondary | ICD-10-CM | POA: Diagnosis not present

## 2022-10-31 DIAGNOSIS — J9690 Respiratory failure, unspecified, unspecified whether with hypoxia or hypercapnia: Secondary | ICD-10-CM | POA: Diagnosis not present

## 2022-10-31 DIAGNOSIS — H409 Unspecified glaucoma: Secondary | ICD-10-CM | POA: Diagnosis not present

## 2022-10-31 DIAGNOSIS — I2510 Atherosclerotic heart disease of native coronary artery without angina pectoris: Secondary | ICD-10-CM | POA: Diagnosis not present

## 2022-10-31 DIAGNOSIS — I10 Essential (primary) hypertension: Secondary | ICD-10-CM | POA: Diagnosis not present

## 2022-10-31 DIAGNOSIS — S61411D Laceration without foreign body of right hand, subsequent encounter: Secondary | ICD-10-CM | POA: Diagnosis not present

## 2022-10-31 DIAGNOSIS — J4489 Other specified chronic obstructive pulmonary disease: Secondary | ICD-10-CM | POA: Diagnosis not present

## 2022-10-31 DIAGNOSIS — E785 Hyperlipidemia, unspecified: Secondary | ICD-10-CM | POA: Diagnosis not present

## 2022-10-31 DIAGNOSIS — F319 Bipolar disorder, unspecified: Secondary | ICD-10-CM | POA: Diagnosis not present

## 2022-10-31 DIAGNOSIS — K219 Gastro-esophageal reflux disease without esophagitis: Secondary | ICD-10-CM | POA: Diagnosis not present

## 2022-11-01 DIAGNOSIS — J4489 Other specified chronic obstructive pulmonary disease: Secondary | ICD-10-CM | POA: Diagnosis not present

## 2022-11-01 DIAGNOSIS — F319 Bipolar disorder, unspecified: Secondary | ICD-10-CM | POA: Diagnosis not present

## 2022-11-01 DIAGNOSIS — S61411D Laceration without foreign body of right hand, subsequent encounter: Secondary | ICD-10-CM | POA: Diagnosis not present

## 2022-11-01 DIAGNOSIS — H409 Unspecified glaucoma: Secondary | ICD-10-CM | POA: Diagnosis not present

## 2022-11-01 DIAGNOSIS — K219 Gastro-esophageal reflux disease without esophagitis: Secondary | ICD-10-CM | POA: Diagnosis not present

## 2022-11-01 DIAGNOSIS — I10 Essential (primary) hypertension: Secondary | ICD-10-CM | POA: Diagnosis not present

## 2022-11-01 DIAGNOSIS — E785 Hyperlipidemia, unspecified: Secondary | ICD-10-CM | POA: Diagnosis not present

## 2022-11-01 DIAGNOSIS — J9690 Respiratory failure, unspecified, unspecified whether with hypoxia or hypercapnia: Secondary | ICD-10-CM | POA: Diagnosis not present

## 2022-11-01 DIAGNOSIS — I2510 Atherosclerotic heart disease of native coronary artery without angina pectoris: Secondary | ICD-10-CM | POA: Diagnosis not present

## 2022-11-04 DIAGNOSIS — J449 Chronic obstructive pulmonary disease, unspecified: Secondary | ICD-10-CM | POA: Diagnosis not present

## 2022-11-05 DIAGNOSIS — E785 Hyperlipidemia, unspecified: Secondary | ICD-10-CM | POA: Diagnosis not present

## 2022-11-05 DIAGNOSIS — H409 Unspecified glaucoma: Secondary | ICD-10-CM | POA: Diagnosis not present

## 2022-11-05 DIAGNOSIS — K219 Gastro-esophageal reflux disease without esophagitis: Secondary | ICD-10-CM | POA: Diagnosis not present

## 2022-11-05 DIAGNOSIS — I2510 Atherosclerotic heart disease of native coronary artery without angina pectoris: Secondary | ICD-10-CM | POA: Diagnosis not present

## 2022-11-05 DIAGNOSIS — J9690 Respiratory failure, unspecified, unspecified whether with hypoxia or hypercapnia: Secondary | ICD-10-CM | POA: Diagnosis not present

## 2022-11-05 DIAGNOSIS — J4489 Other specified chronic obstructive pulmonary disease: Secondary | ICD-10-CM | POA: Diagnosis not present

## 2022-11-05 DIAGNOSIS — F319 Bipolar disorder, unspecified: Secondary | ICD-10-CM | POA: Diagnosis not present

## 2022-11-05 DIAGNOSIS — S61411D Laceration without foreign body of right hand, subsequent encounter: Secondary | ICD-10-CM | POA: Diagnosis not present

## 2022-11-05 DIAGNOSIS — I10 Essential (primary) hypertension: Secondary | ICD-10-CM | POA: Diagnosis not present

## 2022-11-08 DIAGNOSIS — K219 Gastro-esophageal reflux disease without esophagitis: Secondary | ICD-10-CM | POA: Diagnosis not present

## 2022-11-08 DIAGNOSIS — I2510 Atherosclerotic heart disease of native coronary artery without angina pectoris: Secondary | ICD-10-CM | POA: Diagnosis not present

## 2022-11-08 DIAGNOSIS — F319 Bipolar disorder, unspecified: Secondary | ICD-10-CM | POA: Diagnosis not present

## 2022-11-08 DIAGNOSIS — I10 Essential (primary) hypertension: Secondary | ICD-10-CM | POA: Diagnosis not present

## 2022-11-08 DIAGNOSIS — H409 Unspecified glaucoma: Secondary | ICD-10-CM | POA: Diagnosis not present

## 2022-11-08 DIAGNOSIS — J9690 Respiratory failure, unspecified, unspecified whether with hypoxia or hypercapnia: Secondary | ICD-10-CM | POA: Diagnosis not present

## 2022-11-08 DIAGNOSIS — S61411D Laceration without foreign body of right hand, subsequent encounter: Secondary | ICD-10-CM | POA: Diagnosis not present

## 2022-11-08 DIAGNOSIS — J4489 Other specified chronic obstructive pulmonary disease: Secondary | ICD-10-CM | POA: Diagnosis not present

## 2022-11-08 DIAGNOSIS — E785 Hyperlipidemia, unspecified: Secondary | ICD-10-CM | POA: Diagnosis not present

## 2022-11-12 DIAGNOSIS — K219 Gastro-esophageal reflux disease without esophagitis: Secondary | ICD-10-CM | POA: Diagnosis not present

## 2022-11-12 DIAGNOSIS — I2510 Atherosclerotic heart disease of native coronary artery without angina pectoris: Secondary | ICD-10-CM | POA: Diagnosis not present

## 2022-11-12 DIAGNOSIS — I10 Essential (primary) hypertension: Secondary | ICD-10-CM | POA: Diagnosis not present

## 2022-11-12 DIAGNOSIS — S61411D Laceration without foreign body of right hand, subsequent encounter: Secondary | ICD-10-CM | POA: Diagnosis not present

## 2022-11-12 DIAGNOSIS — J4489 Other specified chronic obstructive pulmonary disease: Secondary | ICD-10-CM | POA: Diagnosis not present

## 2022-11-12 DIAGNOSIS — J9690 Respiratory failure, unspecified, unspecified whether with hypoxia or hypercapnia: Secondary | ICD-10-CM | POA: Diagnosis not present

## 2022-11-12 DIAGNOSIS — E785 Hyperlipidemia, unspecified: Secondary | ICD-10-CM | POA: Diagnosis not present

## 2022-11-12 DIAGNOSIS — H409 Unspecified glaucoma: Secondary | ICD-10-CM | POA: Diagnosis not present

## 2022-11-12 DIAGNOSIS — F319 Bipolar disorder, unspecified: Secondary | ICD-10-CM | POA: Diagnosis not present

## 2022-11-18 DIAGNOSIS — F319 Bipolar disorder, unspecified: Secondary | ICD-10-CM | POA: Diagnosis not present

## 2022-11-18 DIAGNOSIS — I2510 Atherosclerotic heart disease of native coronary artery without angina pectoris: Secondary | ICD-10-CM | POA: Diagnosis not present

## 2022-11-18 DIAGNOSIS — K219 Gastro-esophageal reflux disease without esophagitis: Secondary | ICD-10-CM | POA: Diagnosis not present

## 2022-11-18 DIAGNOSIS — J4489 Other specified chronic obstructive pulmonary disease: Secondary | ICD-10-CM | POA: Diagnosis not present

## 2022-11-18 DIAGNOSIS — E785 Hyperlipidemia, unspecified: Secondary | ICD-10-CM | POA: Diagnosis not present

## 2022-11-18 DIAGNOSIS — J9690 Respiratory failure, unspecified, unspecified whether with hypoxia or hypercapnia: Secondary | ICD-10-CM | POA: Diagnosis not present

## 2022-11-18 DIAGNOSIS — S61411D Laceration without foreign body of right hand, subsequent encounter: Secondary | ICD-10-CM | POA: Diagnosis not present

## 2022-11-18 DIAGNOSIS — I10 Essential (primary) hypertension: Secondary | ICD-10-CM | POA: Diagnosis not present

## 2022-11-18 DIAGNOSIS — H409 Unspecified glaucoma: Secondary | ICD-10-CM | POA: Diagnosis not present

## 2022-11-19 DIAGNOSIS — K219 Gastro-esophageal reflux disease without esophagitis: Secondary | ICD-10-CM | POA: Diagnosis not present

## 2022-11-19 DIAGNOSIS — Z86010 Personal history of colonic polyps: Secondary | ICD-10-CM | POA: Diagnosis not present

## 2022-11-21 DIAGNOSIS — E785 Hyperlipidemia, unspecified: Secondary | ICD-10-CM | POA: Diagnosis not present

## 2022-11-21 DIAGNOSIS — S61411D Laceration without foreign body of right hand, subsequent encounter: Secondary | ICD-10-CM | POA: Diagnosis not present

## 2022-11-21 DIAGNOSIS — I2510 Atherosclerotic heart disease of native coronary artery without angina pectoris: Secondary | ICD-10-CM | POA: Diagnosis not present

## 2022-11-21 DIAGNOSIS — K219 Gastro-esophageal reflux disease without esophagitis: Secondary | ICD-10-CM | POA: Diagnosis not present

## 2022-11-21 DIAGNOSIS — J9690 Respiratory failure, unspecified, unspecified whether with hypoxia or hypercapnia: Secondary | ICD-10-CM | POA: Diagnosis not present

## 2022-11-21 DIAGNOSIS — H409 Unspecified glaucoma: Secondary | ICD-10-CM | POA: Diagnosis not present

## 2022-11-21 DIAGNOSIS — F319 Bipolar disorder, unspecified: Secondary | ICD-10-CM | POA: Diagnosis not present

## 2022-11-21 DIAGNOSIS — J4489 Other specified chronic obstructive pulmonary disease: Secondary | ICD-10-CM | POA: Diagnosis not present

## 2022-11-21 DIAGNOSIS — I10 Essential (primary) hypertension: Secondary | ICD-10-CM | POA: Diagnosis not present

## 2022-11-25 DIAGNOSIS — K219 Gastro-esophageal reflux disease without esophagitis: Secondary | ICD-10-CM | POA: Diagnosis not present

## 2022-11-25 DIAGNOSIS — H409 Unspecified glaucoma: Secondary | ICD-10-CM | POA: Diagnosis not present

## 2022-11-25 DIAGNOSIS — F319 Bipolar disorder, unspecified: Secondary | ICD-10-CM | POA: Diagnosis not present

## 2022-11-25 DIAGNOSIS — E785 Hyperlipidemia, unspecified: Secondary | ICD-10-CM | POA: Diagnosis not present

## 2022-11-25 DIAGNOSIS — J4489 Other specified chronic obstructive pulmonary disease: Secondary | ICD-10-CM | POA: Diagnosis not present

## 2022-11-25 DIAGNOSIS — J9690 Respiratory failure, unspecified, unspecified whether with hypoxia or hypercapnia: Secondary | ICD-10-CM | POA: Diagnosis not present

## 2022-11-25 DIAGNOSIS — S61411D Laceration without foreign body of right hand, subsequent encounter: Secondary | ICD-10-CM | POA: Diagnosis not present

## 2022-11-25 DIAGNOSIS — I2510 Atherosclerotic heart disease of native coronary artery without angina pectoris: Secondary | ICD-10-CM | POA: Diagnosis not present

## 2022-11-25 DIAGNOSIS — I10 Essential (primary) hypertension: Secondary | ICD-10-CM | POA: Diagnosis not present

## 2022-11-28 DIAGNOSIS — J9690 Respiratory failure, unspecified, unspecified whether with hypoxia or hypercapnia: Secondary | ICD-10-CM | POA: Diagnosis not present

## 2022-11-28 DIAGNOSIS — J4489 Other specified chronic obstructive pulmonary disease: Secondary | ICD-10-CM | POA: Diagnosis not present

## 2022-11-28 DIAGNOSIS — S61411D Laceration without foreign body of right hand, subsequent encounter: Secondary | ICD-10-CM | POA: Diagnosis not present

## 2022-11-28 DIAGNOSIS — I10 Essential (primary) hypertension: Secondary | ICD-10-CM | POA: Diagnosis not present

## 2022-11-28 DIAGNOSIS — F319 Bipolar disorder, unspecified: Secondary | ICD-10-CM | POA: Diagnosis not present

## 2022-11-28 DIAGNOSIS — I2510 Atherosclerotic heart disease of native coronary artery without angina pectoris: Secondary | ICD-10-CM | POA: Diagnosis not present

## 2022-11-28 DIAGNOSIS — E785 Hyperlipidemia, unspecified: Secondary | ICD-10-CM | POA: Diagnosis not present

## 2022-11-28 DIAGNOSIS — H409 Unspecified glaucoma: Secondary | ICD-10-CM | POA: Diagnosis not present

## 2022-11-28 DIAGNOSIS — K219 Gastro-esophageal reflux disease without esophagitis: Secondary | ICD-10-CM | POA: Diagnosis not present

## 2022-12-02 DIAGNOSIS — E785 Hyperlipidemia, unspecified: Secondary | ICD-10-CM | POA: Diagnosis not present

## 2022-12-02 DIAGNOSIS — H409 Unspecified glaucoma: Secondary | ICD-10-CM | POA: Diagnosis not present

## 2022-12-02 DIAGNOSIS — J4489 Other specified chronic obstructive pulmonary disease: Secondary | ICD-10-CM | POA: Diagnosis not present

## 2022-12-02 DIAGNOSIS — K219 Gastro-esophageal reflux disease without esophagitis: Secondary | ICD-10-CM | POA: Diagnosis not present

## 2022-12-02 DIAGNOSIS — I10 Essential (primary) hypertension: Secondary | ICD-10-CM | POA: Diagnosis not present

## 2022-12-02 DIAGNOSIS — F319 Bipolar disorder, unspecified: Secondary | ICD-10-CM | POA: Diagnosis not present

## 2022-12-02 DIAGNOSIS — I2510 Atherosclerotic heart disease of native coronary artery without angina pectoris: Secondary | ICD-10-CM | POA: Diagnosis not present

## 2022-12-02 DIAGNOSIS — S61411D Laceration without foreign body of right hand, subsequent encounter: Secondary | ICD-10-CM | POA: Diagnosis not present

## 2022-12-02 DIAGNOSIS — J9690 Respiratory failure, unspecified, unspecified whether with hypoxia or hypercapnia: Secondary | ICD-10-CM | POA: Diagnosis not present

## 2022-12-04 DIAGNOSIS — J9690 Respiratory failure, unspecified, unspecified whether with hypoxia or hypercapnia: Secondary | ICD-10-CM | POA: Diagnosis not present

## 2022-12-04 DIAGNOSIS — I10 Essential (primary) hypertension: Secondary | ICD-10-CM | POA: Diagnosis not present

## 2022-12-04 DIAGNOSIS — H409 Unspecified glaucoma: Secondary | ICD-10-CM | POA: Diagnosis not present

## 2022-12-04 DIAGNOSIS — F319 Bipolar disorder, unspecified: Secondary | ICD-10-CM | POA: Diagnosis not present

## 2022-12-04 DIAGNOSIS — J4489 Other specified chronic obstructive pulmonary disease: Secondary | ICD-10-CM | POA: Diagnosis not present

## 2022-12-04 DIAGNOSIS — I2510 Atherosclerotic heart disease of native coronary artery without angina pectoris: Secondary | ICD-10-CM | POA: Diagnosis not present

## 2022-12-04 DIAGNOSIS — K219 Gastro-esophageal reflux disease without esophagitis: Secondary | ICD-10-CM | POA: Diagnosis not present

## 2022-12-04 DIAGNOSIS — S61411D Laceration without foreign body of right hand, subsequent encounter: Secondary | ICD-10-CM | POA: Diagnosis not present

## 2022-12-04 DIAGNOSIS — E785 Hyperlipidemia, unspecified: Secondary | ICD-10-CM | POA: Diagnosis not present

## 2022-12-05 DIAGNOSIS — L03115 Cellulitis of right lower limb: Secondary | ICD-10-CM | POA: Diagnosis not present

## 2022-12-05 DIAGNOSIS — J449 Chronic obstructive pulmonary disease, unspecified: Secondary | ICD-10-CM | POA: Diagnosis not present

## 2022-12-10 DIAGNOSIS — K219 Gastro-esophageal reflux disease without esophagitis: Secondary | ICD-10-CM | POA: Diagnosis not present

## 2022-12-10 DIAGNOSIS — E785 Hyperlipidemia, unspecified: Secondary | ICD-10-CM | POA: Diagnosis not present

## 2022-12-10 DIAGNOSIS — J4489 Other specified chronic obstructive pulmonary disease: Secondary | ICD-10-CM | POA: Diagnosis not present

## 2022-12-10 DIAGNOSIS — I10 Essential (primary) hypertension: Secondary | ICD-10-CM | POA: Diagnosis not present

## 2022-12-10 DIAGNOSIS — I2510 Atherosclerotic heart disease of native coronary artery without angina pectoris: Secondary | ICD-10-CM | POA: Diagnosis not present

## 2022-12-10 DIAGNOSIS — F319 Bipolar disorder, unspecified: Secondary | ICD-10-CM | POA: Diagnosis not present

## 2022-12-10 DIAGNOSIS — S61411D Laceration without foreign body of right hand, subsequent encounter: Secondary | ICD-10-CM | POA: Diagnosis not present

## 2022-12-10 DIAGNOSIS — H409 Unspecified glaucoma: Secondary | ICD-10-CM | POA: Diagnosis not present

## 2022-12-10 DIAGNOSIS — J9690 Respiratory failure, unspecified, unspecified whether with hypoxia or hypercapnia: Secondary | ICD-10-CM | POA: Diagnosis not present

## 2022-12-13 DIAGNOSIS — E785 Hyperlipidemia, unspecified: Secondary | ICD-10-CM | POA: Diagnosis not present

## 2022-12-13 DIAGNOSIS — J4489 Other specified chronic obstructive pulmonary disease: Secondary | ICD-10-CM | POA: Diagnosis not present

## 2022-12-13 DIAGNOSIS — H409 Unspecified glaucoma: Secondary | ICD-10-CM | POA: Diagnosis not present

## 2022-12-13 DIAGNOSIS — I10 Essential (primary) hypertension: Secondary | ICD-10-CM | POA: Diagnosis not present

## 2022-12-13 DIAGNOSIS — S61411D Laceration without foreign body of right hand, subsequent encounter: Secondary | ICD-10-CM | POA: Diagnosis not present

## 2022-12-13 DIAGNOSIS — I2510 Atherosclerotic heart disease of native coronary artery without angina pectoris: Secondary | ICD-10-CM | POA: Diagnosis not present

## 2022-12-13 DIAGNOSIS — K219 Gastro-esophageal reflux disease without esophagitis: Secondary | ICD-10-CM | POA: Diagnosis not present

## 2022-12-13 DIAGNOSIS — J9690 Respiratory failure, unspecified, unspecified whether with hypoxia or hypercapnia: Secondary | ICD-10-CM | POA: Diagnosis not present

## 2022-12-13 DIAGNOSIS — F319 Bipolar disorder, unspecified: Secondary | ICD-10-CM | POA: Diagnosis not present

## 2022-12-16 DIAGNOSIS — I10 Essential (primary) hypertension: Secondary | ICD-10-CM | POA: Diagnosis not present

## 2022-12-16 DIAGNOSIS — I2510 Atherosclerotic heart disease of native coronary artery without angina pectoris: Secondary | ICD-10-CM | POA: Diagnosis not present

## 2022-12-16 DIAGNOSIS — H409 Unspecified glaucoma: Secondary | ICD-10-CM | POA: Diagnosis not present

## 2022-12-16 DIAGNOSIS — S61411D Laceration without foreign body of right hand, subsequent encounter: Secondary | ICD-10-CM | POA: Diagnosis not present

## 2022-12-16 DIAGNOSIS — E785 Hyperlipidemia, unspecified: Secondary | ICD-10-CM | POA: Diagnosis not present

## 2022-12-16 DIAGNOSIS — J4489 Other specified chronic obstructive pulmonary disease: Secondary | ICD-10-CM | POA: Diagnosis not present

## 2022-12-16 DIAGNOSIS — J9690 Respiratory failure, unspecified, unspecified whether with hypoxia or hypercapnia: Secondary | ICD-10-CM | POA: Diagnosis not present

## 2022-12-16 DIAGNOSIS — F319 Bipolar disorder, unspecified: Secondary | ICD-10-CM | POA: Diagnosis not present

## 2022-12-16 DIAGNOSIS — K219 Gastro-esophageal reflux disease without esophagitis: Secondary | ICD-10-CM | POA: Diagnosis not present

## 2022-12-25 DIAGNOSIS — S81801D Unspecified open wound, right lower leg, subsequent encounter: Secondary | ICD-10-CM | POA: Diagnosis not present

## 2022-12-25 DIAGNOSIS — J4489 Other specified chronic obstructive pulmonary disease: Secondary | ICD-10-CM | POA: Diagnosis not present

## 2022-12-25 DIAGNOSIS — F319 Bipolar disorder, unspecified: Secondary | ICD-10-CM | POA: Diagnosis not present

## 2022-12-25 DIAGNOSIS — K219 Gastro-esophageal reflux disease without esophagitis: Secondary | ICD-10-CM | POA: Diagnosis not present

## 2022-12-25 DIAGNOSIS — I10 Essential (primary) hypertension: Secondary | ICD-10-CM | POA: Diagnosis not present

## 2022-12-25 DIAGNOSIS — E785 Hyperlipidemia, unspecified: Secondary | ICD-10-CM | POA: Diagnosis not present

## 2022-12-25 DIAGNOSIS — J9690 Respiratory failure, unspecified, unspecified whether with hypoxia or hypercapnia: Secondary | ICD-10-CM | POA: Diagnosis not present

## 2022-12-25 DIAGNOSIS — I2510 Atherosclerotic heart disease of native coronary artery without angina pectoris: Secondary | ICD-10-CM | POA: Diagnosis not present

## 2022-12-25 DIAGNOSIS — H409 Unspecified glaucoma: Secondary | ICD-10-CM | POA: Diagnosis not present

## 2022-12-31 DIAGNOSIS — M7989 Other specified soft tissue disorders: Secondary | ICD-10-CM | POA: Diagnosis not present

## 2023-01-02 ENCOUNTER — Other Ambulatory Visit: Payer: Self-pay | Admitting: Pulmonary Disease

## 2023-01-02 DIAGNOSIS — M7989 Other specified soft tissue disorders: Secondary | ICD-10-CM

## 2023-01-06 ENCOUNTER — Ambulatory Visit
Admission: RE | Admit: 2023-01-06 | Discharge: 2023-01-06 | Disposition: A | Discharge status: Home / Self Care | Payer: Medicare HMO | Source: Ambulatory Visit | Attending: Pulmonary Disease | Admitting: Pulmonary Disease

## 2023-01-06 DIAGNOSIS — E785 Hyperlipidemia, unspecified: Secondary | ICD-10-CM | POA: Diagnosis not present

## 2023-01-06 DIAGNOSIS — K219 Gastro-esophageal reflux disease without esophagitis: Secondary | ICD-10-CM | POA: Diagnosis not present

## 2023-01-06 DIAGNOSIS — M7989 Other specified soft tissue disorders: Secondary | ICD-10-CM | POA: Insufficient documentation

## 2023-01-06 DIAGNOSIS — H409 Unspecified glaucoma: Secondary | ICD-10-CM | POA: Diagnosis not present

## 2023-01-06 DIAGNOSIS — S81801D Unspecified open wound, right lower leg, subsequent encounter: Secondary | ICD-10-CM | POA: Diagnosis not present

## 2023-01-06 DIAGNOSIS — F319 Bipolar disorder, unspecified: Secondary | ICD-10-CM | POA: Diagnosis not present

## 2023-01-06 DIAGNOSIS — I2510 Atherosclerotic heart disease of native coronary artery without angina pectoris: Secondary | ICD-10-CM | POA: Diagnosis not present

## 2023-01-06 DIAGNOSIS — I10 Essential (primary) hypertension: Secondary | ICD-10-CM | POA: Diagnosis not present

## 2023-01-06 DIAGNOSIS — J9690 Respiratory failure, unspecified, unspecified whether with hypoxia or hypercapnia: Secondary | ICD-10-CM | POA: Diagnosis not present

## 2023-01-06 DIAGNOSIS — J4489 Other specified chronic obstructive pulmonary disease: Secondary | ICD-10-CM | POA: Diagnosis not present

## 2023-01-08 DIAGNOSIS — Z79899 Other long term (current) drug therapy: Secondary | ICD-10-CM | POA: Diagnosis not present

## 2023-01-08 DIAGNOSIS — R739 Hyperglycemia, unspecified: Secondary | ICD-10-CM | POA: Diagnosis not present

## 2023-01-08 DIAGNOSIS — E782 Mixed hyperlipidemia: Secondary | ICD-10-CM | POA: Diagnosis not present

## 2023-01-08 DIAGNOSIS — Z125 Encounter for screening for malignant neoplasm of prostate: Secondary | ICD-10-CM | POA: Diagnosis not present

## 2023-01-16 DIAGNOSIS — I10 Essential (primary) hypertension: Secondary | ICD-10-CM | POA: Diagnosis not present

## 2023-01-16 DIAGNOSIS — H409 Unspecified glaucoma: Secondary | ICD-10-CM | POA: Diagnosis not present

## 2023-01-16 DIAGNOSIS — E785 Hyperlipidemia, unspecified: Secondary | ICD-10-CM | POA: Diagnosis not present

## 2023-01-16 DIAGNOSIS — I2510 Atherosclerotic heart disease of native coronary artery without angina pectoris: Secondary | ICD-10-CM | POA: Diagnosis not present

## 2023-01-16 DIAGNOSIS — J4489 Other specified chronic obstructive pulmonary disease: Secondary | ICD-10-CM | POA: Diagnosis not present

## 2023-01-16 DIAGNOSIS — F319 Bipolar disorder, unspecified: Secondary | ICD-10-CM | POA: Diagnosis not present

## 2023-01-16 DIAGNOSIS — J9690 Respiratory failure, unspecified, unspecified whether with hypoxia or hypercapnia: Secondary | ICD-10-CM | POA: Diagnosis not present

## 2023-01-16 DIAGNOSIS — S81801D Unspecified open wound, right lower leg, subsequent encounter: Secondary | ICD-10-CM | POA: Diagnosis not present

## 2023-01-16 DIAGNOSIS — K219 Gastro-esophageal reflux disease without esophagitis: Secondary | ICD-10-CM | POA: Diagnosis not present

## 2023-02-26 IMAGING — MR MR HEAD W/O CM
13 series · 40 of 48 positions shown · non-contrast
Comparison: 12/03/2021 MRI head, correlation is also made with CTA
head neck 01/29/2022

CLINICAL DATA: Stroke suspected, right leg weakness and gait
difficulty

EXAM:
MRI HEAD WITHOUT CONTRAST
TECHNIQUE: Multiplanar, multiecho pulse sequences of the brain and surrounding
structures were obtained without intravenous contrast.

[Series 5: ax dwi_tracew · axial · 3.0mm · 0.65mm/px · z∈[-128,+23]mm · 2 of 48 slices shown]
[im 1/48]
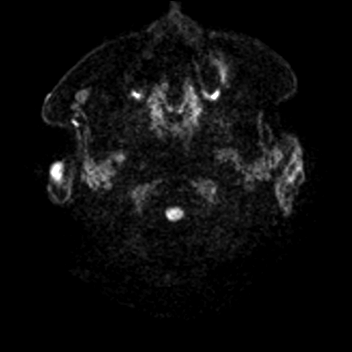
[im 48/48]
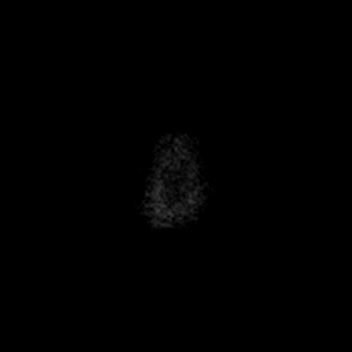

[Series 6: ax dwi_adc · axial · 3.0mm · 0.65mm/px · z∈[-128,+23]mm · 3 of 48 slices shown]
[im 1/48]
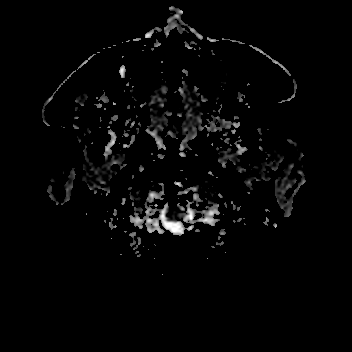
[im 24/48]
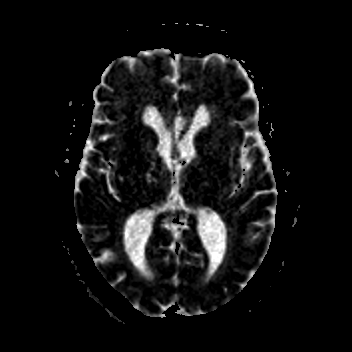
[im 48/48]
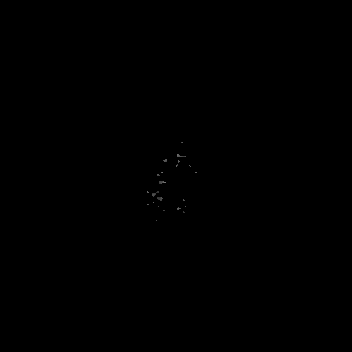

[Series 7: cor dwi_tracew · coronal · 5.0mm · 0.65mm/px · 3 of 40 slices shown]
[im 1/40]
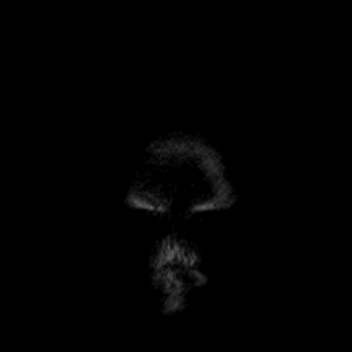
[im 20/40]
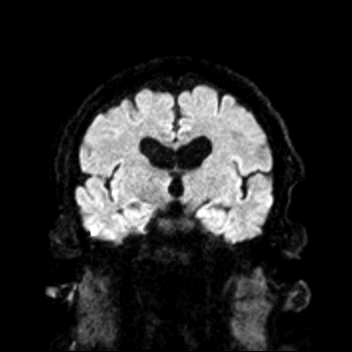
[im 40/40]
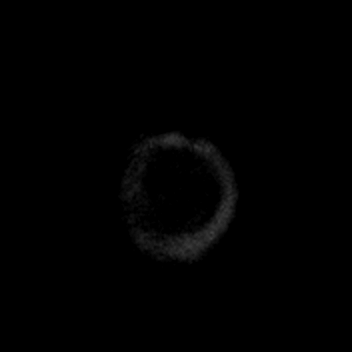

[Series 8: cor dwi_adc · coronal · 5.0mm · 0.65mm/px · 3 of 40 slices shown]
[im 1/40]
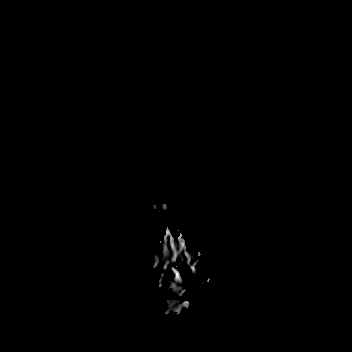
[im 20/40]
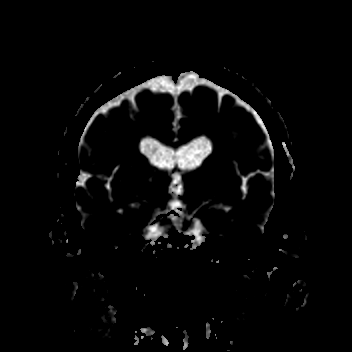
[im 40/40]
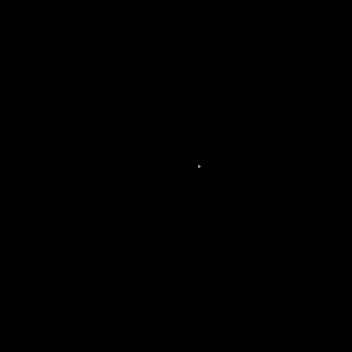

[Series 9: T1 · sagittal · 5.0mm · 0.62mm/px · 1 of 22 slices shown (1 of 2)]
[im 1/22]
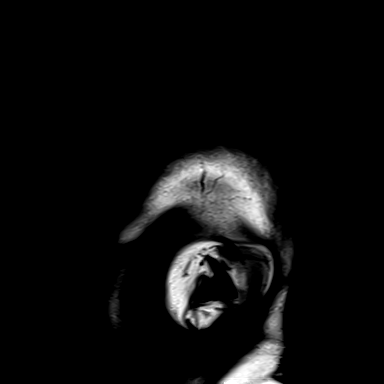

[Series 10: T2 · axial · 5.0mm · 0.53mm/px · z∈[-131,+28]mm · 2 of 28 slices shown (1 of 2)]
[im 1/28]
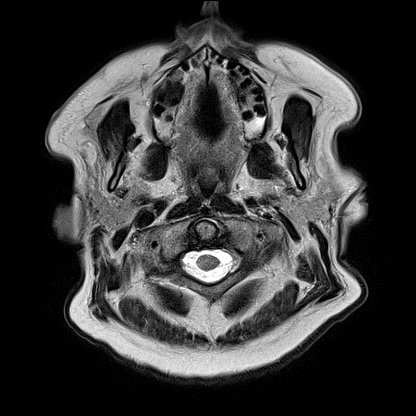
[im 28/28]
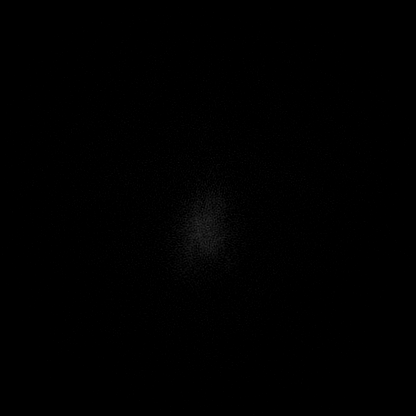

[Series 12: pha_images · axial · 3.0mm · 0.90mm/px · z∈[-139,+28]mm · 4 of 58 slices shown]
[im 1/58]
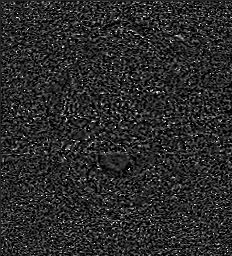
[im 20/58]
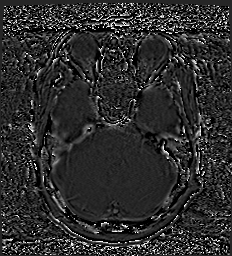
[im 39/58]
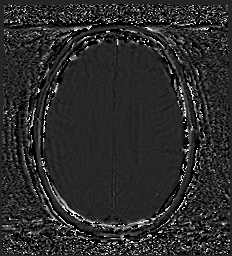
[im 58/58]
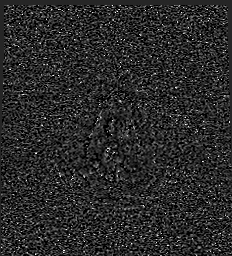

[Series 13: swi_images · axial · 3.0mm · 0.90mm/px · z∈[-139,+34]mm · 4 of 60 slices shown]
[im 1/60]
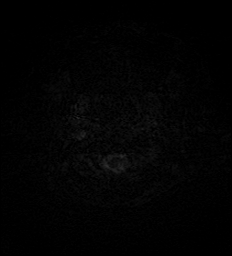
[im 20/60]
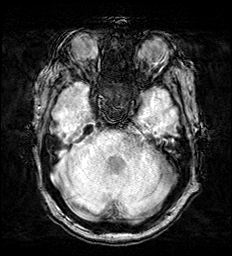
[im 40/60]
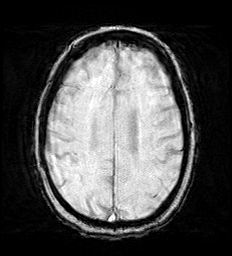
[im 60/60]
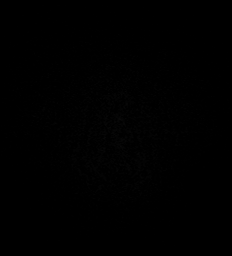

[Series 15: FLAIR · axial · 3.0mm · 0.53mm/px · z∈[-131,+28]mm · 4 of 55 slices shown (1 of 2)]
[im 1/55]
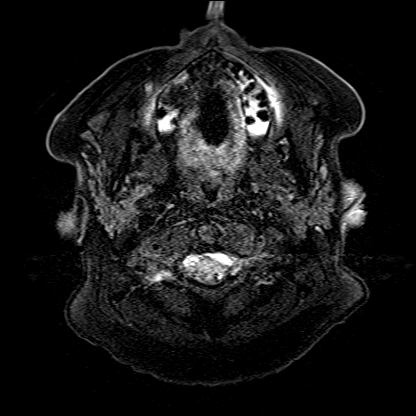
[im 19/55]
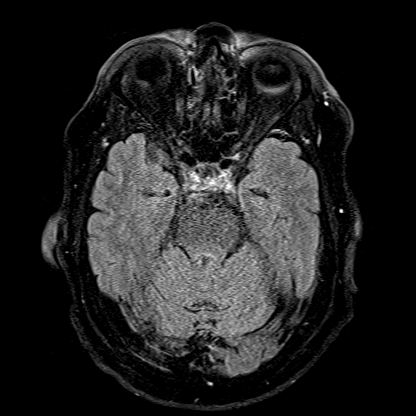
[im 37/55]
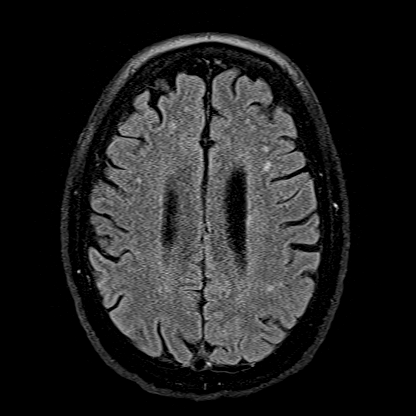
[im 55/55]
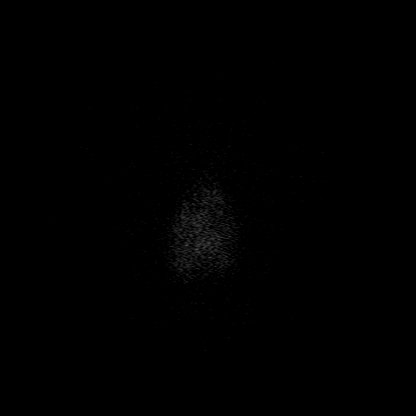

[Series 16: T1 · axial · 1.0mm · 0.98mm/px · z∈[-140,+31]mm · 8 of 176 slices shown (2 of 2)]
[im 1/176]
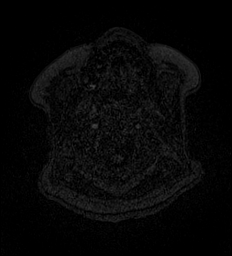
[im 36/176]
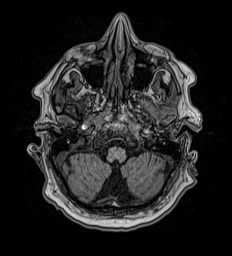
[im 53/176]
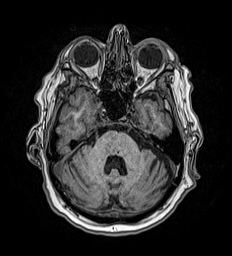
[im 71/176]
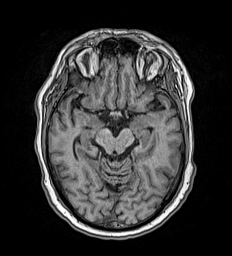
[im 106/176]
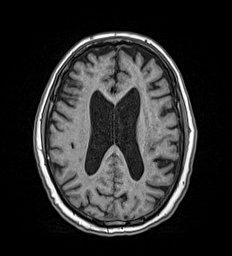
[im 123/176]
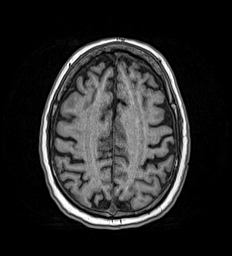
[im 141/176]
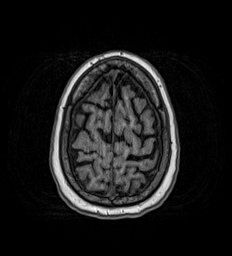
[im 176/176]
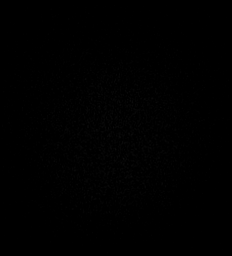

[Series 17: T2 · coronal · 3.0mm · 0.47mm/px · 2 of 35 slices shown (2 of 2)]
[im 1/35]
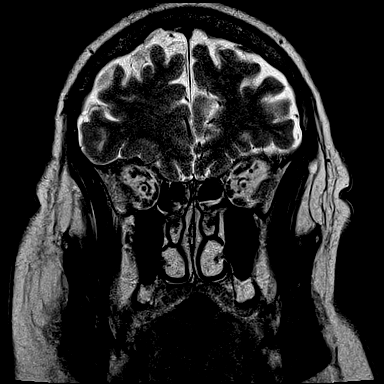
[im 35/35]
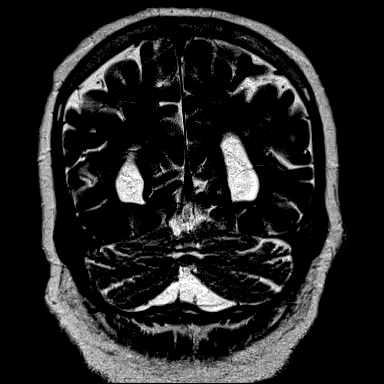

[Series 18: FLAIR · coronal · 3.0mm · 0.47mm/px · 2 of 35 slices shown (2 of 2)]
[im 1/35]
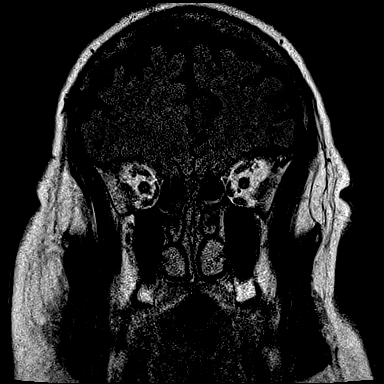
[im 35/35]
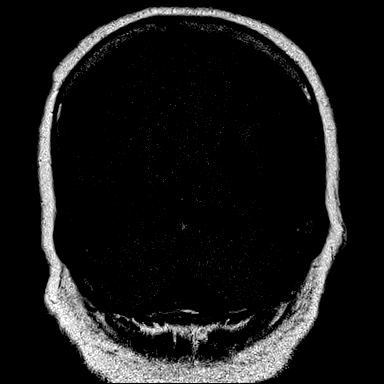

[Series 1020: cor thins rl · coronal · 3.0mm · 0.49mm/px · 2 of 107 slices shown]
[im 1/107]
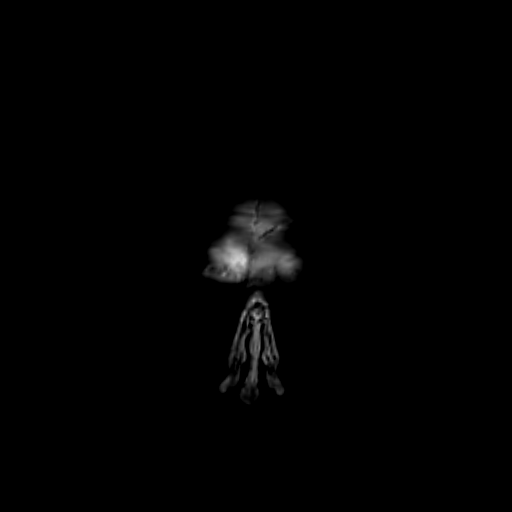
[im 18/107]
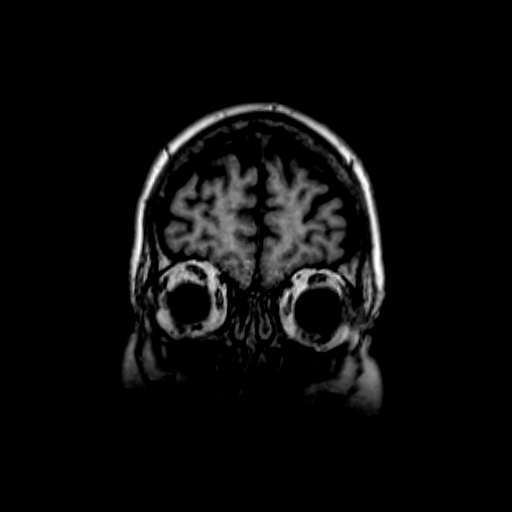

[40 of 48 positions shown; findings below may reference images not displayed]

FINDINGS: Brain: No restricted diffusion to suggest acute or subacute infarct.
No acute hemorrhage, mass, mass effect, or midline shift. No
hydrocephalus or extra-axial collection. Degree of cerebral volume
loss is advanced for age but unchanged compared to 12/03/2021.
Scattered T2 hyperintense signal in the periventricular white
matter, likely the sequela of mild-to-moderate chronic small vessel
ischemic disease. Redemonstrated remote right posterior temporal
lobe cortical infarct with hemosiderin deposition, likely associated
petechial hemorrhage. Punctate foci of hemosiderin deposition in the
right cerebellum, unchanged and likely the sequela of prior
hypertensive microhemorrhages.

Vascular: Normal flow voids.

Skull and upper cervical spine: Normal marrow signal.

Sinuses/Orbits: No acute finding. Status post bilateral lens
replacements.

Other: The mastoids are well aerated.
IMPRESSION: No acute intracranial process. No evidence of acute or subacute
infarct.

## 2023-03-10 ENCOUNTER — Ambulatory Visit: Admit: 2023-03-10 | Payer: Medicare HMO

## 2023-03-10 SURGERY — COLONOSCOPY WITH PROPOFOL
Anesthesia: General

## 2023-04-30 DIAGNOSIS — Z0000 Encounter for general adult medical examination without abnormal findings: Secondary | ICD-10-CM | POA: Diagnosis not present

## 2023-04-30 DIAGNOSIS — R972 Elevated prostate specific antigen [PSA]: Secondary | ICD-10-CM | POA: Diagnosis not present

## 2023-04-30 DIAGNOSIS — J9610 Chronic respiratory failure, unspecified whether with hypoxia or hypercapnia: Secondary | ICD-10-CM | POA: Diagnosis not present

## 2023-04-30 DIAGNOSIS — G309 Alzheimer's disease, unspecified: Secondary | ICD-10-CM | POA: Diagnosis not present

## 2023-04-30 DIAGNOSIS — J449 Chronic obstructive pulmonary disease, unspecified: Secondary | ICD-10-CM | POA: Diagnosis not present

## 2023-04-30 DIAGNOSIS — F0283 Dementia in other diseases classified elsewhere, unspecified severity, with mood disturbance: Secondary | ICD-10-CM | POA: Diagnosis not present

## 2023-04-30 DIAGNOSIS — I7789 Other specified disorders of arteries and arterioles: Secondary | ICD-10-CM | POA: Diagnosis not present

## 2023-04-30 DIAGNOSIS — Z1331 Encounter for screening for depression: Secondary | ICD-10-CM | POA: Diagnosis not present

## 2023-04-30 DIAGNOSIS — G40909 Epilepsy, unspecified, not intractable, without status epilepticus: Secondary | ICD-10-CM | POA: Diagnosis not present

## 2023-05-01 ENCOUNTER — Other Ambulatory Visit: Payer: Self-pay | Admitting: Internal Medicine

## 2023-05-01 DIAGNOSIS — R972 Elevated prostate specific antigen [PSA]: Secondary | ICD-10-CM

## 2023-05-04 ENCOUNTER — Ambulatory Visit
Admission: RE | Admit: 2023-05-04 | Discharge: 2023-05-04 | Disposition: A | Discharge status: Home / Self Care | Payer: Medicare HMO | Source: Ambulatory Visit | Attending: Internal Medicine | Admitting: Internal Medicine

## 2023-05-04 DIAGNOSIS — K5730 Diverticulosis of large intestine without perforation or abscess without bleeding: Secondary | ICD-10-CM | POA: Diagnosis not present

## 2023-05-04 DIAGNOSIS — N4289 Other specified disorders of prostate: Secondary | ICD-10-CM | POA: Diagnosis not present

## 2023-05-04 DIAGNOSIS — R972 Elevated prostate specific antigen [PSA]: Secondary | ICD-10-CM | POA: Diagnosis not present

## 2023-05-04 MED ORDER — GADOBUTROL 1 MMOL/ML IV SOLN
7.5000 mL | Freq: Once | INTRAVENOUS | Status: AC | PRN
  Administered 2023-05-04: 7.5 mL via INTRAVENOUS

## 2023-06-13 ENCOUNTER — Ambulatory Visit: Payer: Medicare HMO | Admitting: Urology

## 2023-06-13 ENCOUNTER — Encounter: Payer: Self-pay | Admitting: Urology

## 2023-06-13 VITALS — BP 138/70 | HR 84 | Ht 66.0 in | Wt 224.0 lb

## 2023-06-13 DIAGNOSIS — R3129 Other microscopic hematuria: Secondary | ICD-10-CM | POA: Diagnosis not present

## 2023-06-13 DIAGNOSIS — R972 Elevated prostate specific antigen [PSA]: Secondary | ICD-10-CM

## 2023-06-13 NOTE — Progress Notes (Signed)
I, Bradley Bowers, acting as a scribe for [name], MD., have documented all relevant documentation on the behalf of [name], MD, as directed by [name], MD while in the presence of [name], MD.  06/13/2023 1:27 PM   Bradley Bowers 08/29/1946 [id number]  Referring provider: Danella Penton, MD [phone] [facility] [address] [facility] [place],  [place] [address]  Chief Complaint  Patient presents with   Elevated PSA    HPI: Bradley Bowers is a 77 y.o. male referred for an evaluation of an elevated PSA.  PSA performed April 2024 slightly elevated at 5.53 Repeated 04/30/23 and had increased to 11.1 Baseline PSA between 2020 and 2023 was in the mid 2 upper 3 range. Urinalysis performed 01/07/25 did show 7 RBCs per/hpf  A prior urinalysis January 2024 showed 8 RBCs per/hpf He has urinary frequency Denies dysuria or gross hematuria. Prostate MRI was ordered which showed a 41 cc gland and PI-RADS 3 lesions x2.   PMH: Past Medical History:  Diagnosis Date   Arterial vascular disease 10/24/2014   Overview:  MI 1994, small-vessel disease requiring coumadin    Asthma    Centrilobular emphysema (HCC) 10/24/2014   Overview:  2L O2 prn    Centrilobular emphysema (HCC)    Chronic obstructive pulmonary disease (HCC) 11/06/2015   Coronary artery disease    GERD (gastroesophageal reflux disease)    Glaucoma    Heart attack (HCC) 2/17, 1994   Heart disease    Hyperlipemia    Hypertension    Restless leg 10/24/2014   Sleep apnea     Surgical History: Past Surgical History:  Procedure Laterality Date   CATARACT EXTRACTION W/ INTRAOCULAR LENS  IMPLANT, BILATERAL     COLONOSCOPY WITH PROPOFOL N/A 02/19/2018   Procedure: COLONOSCOPY WITH PROPOFOL;  Surgeon: Christena Deem, MD;  Location: [facility] ENDOSCOPY;  Service: Endoscopy;  Laterality: N/A;   ESOPHAGOGASTRODUODENOSCOPY (EGD) WITH PROPOFOL N/A 02/19/2018   Procedure:  ESOPHAGOGASTRODUODENOSCOPY (EGD) WITH PROPOFOL;  Surgeon: Christena Deem, MD;  Location: [facility] ENDOSCOPY;  Service: Endoscopy;  Laterality: N/A;   EYE SURGERY      Home Medications:  Allergies as of 06/13/2023   No Known Allergies      Medication List        Accurate as of June 13, 2023  1:27 PM. If you have any questions, ask your nurse or doctor.          albuterol 108 (90 Base) MCG/ACT inhaler Commonly known as: VENTOLIN HFA Inhale 1-2 puffs into the lungs every 4 (four) hours as needed.   aspirin EC 81 MG tablet Take 81 mg by mouth daily.   atorvastatin 80 MG tablet Commonly known as: LIPITOR Take 80 mg by mouth daily.   carbamazepine 200 MG tablet Commonly known as: TEGRETOL Take 1 tablet by mouth 2 (two) times daily.   cholecalciferol 25 MCG (1000 UNIT) tablet Commonly known as: VITAMIN D3 Take 1,000 Units by mouth daily.   clopidogrel 75 MG tablet Commonly known as: PLAVIX Take 75 mg by mouth daily.   cyanocobalamin 1000 MCG tablet Commonly known as: VITAMIN B12 Take 1,000 mcg by mouth daily.   diltiazem 180 MG 24 hr capsule Commonly known as: CARDIZEM CD Take 180 mg by mouth daily.   divalproex 500 MG 24 hr tablet Commonly known as: DEPAKOTE ER Take 500 mg by mouth daily.   donepezil 10 MG tablet Commonly known as:  ARICEPT Take 1 tablet by mouth at bedtime.   feeding supplement Liqd Take 237 mLs by mouth 2 (two) times daily between meals.   fluticasone 50 MCG/ACT nasal spray Commonly known as: FLONASE Place 1 spray into both nostrils daily.   mometasone-formoterol 100-5 MCG/ACT Aero Commonly known as: DULERA Inhale 2 puffs into the lungs 2 (two) times daily.   montelukast 10 MG tablet Commonly known as: SINGULAIR Take 10 mg by mouth at bedtime.   pantoprazole 40 MG tablet Commonly known as: PROTONIX Take 40 mg by mouth 2 (two) times daily.   polyethylene glycol 17 g packet Commonly known as: MIRALAX / GLYCOLAX Take 17 g by  mouth daily as needed.   spironolactone 25 MG tablet Commonly known as: ALDACTONE Take 1 tablet by mouth daily.   timolol 0.5 % ophthalmic solution Commonly known as: TIMOPTIC 1 drop 2 (two) times daily.   tiotropium 18 MCG inhalation capsule Commonly known as: SPIRIVA Place 18 mcg into inhaler and inhale daily.   torsemide 20 MG tablet Commonly known as: DEMADEX Take 20 mg by mouth daily.   Travatan Z 0.004 % Soln ophthalmic solution Generic drug: Travoprost (BAK Free) Place 1 drop into both eyes at bedtime.        Allergies: No Known Allergies  Family History: Family History  Problem Relation Age of Onset   Cancer Mother    Nephrolithiasis Father    Prostate cancer Neg Hx    Bladder Cancer Neg Hx    Kidney cancer Neg Hx     Social History:  reports that he quit smoking about 29 years ago. His smoking use included cigarettes. He started smoking about 59 years ago. He has a 30 pack-year smoking history. He has never used smokeless tobacco. He reports that he does not drink alcohol and does not use drugs.   Physical Exam: BP 138/70   Pulse 84   Ht 5\' 6"  (1.676 m)   Wt 224 lb (101.6 kg)   BMI 36.15 kg/m   Constitutional:  Alert and oriented, No acute distress. HEENT: [place] AT Respiratory: Normal respiratory effort, no increased work of breathing. GU: Prostate 40 grams, smooth without nodules. Psychiatric: Normal mood and affect.   Pertinent Imaging: MRI was personally reviewed and interpreted.   MRI EXAM: MR PROSTATE WITHOUT AND WITH CONTRAST   TECHNIQUE: Multiplanar multisequence MRI images were obtained of the pelvis centered about the prostate. Pre and post contrast images were obtained.   CONTRAST:  7.35mL GADAVIST GADOBUTROL 1 MMOL/ML IV SOLN   COMPARISON:  CT pelvis 08/25/2022   FINDINGS: Prostate:   Hazy low T2 signal in the peripheral zone is nonfocal, likely postinflammatory, and is considered PI-RADS category 2.   Region of interest #  1: PI-RADS category 3 lesion of the left posterolateral peripheral zone in the mid gland and apex, with focally reduced T2 signal (image 45, series 9) corresponding to reduced ADC map activity (image 15, series 7). This measures 0.35 cc (0.9 by 0.7 by 0.8 cm).   Region of interest # 2: PI-RADS category 3 lesion of the left anterior peripheral zone in the mid gland with focally reduced T2 signal (image 39, series 7) corresponding to reduced ADC map activity (image 13, series 7). This measures 0.30 cc (1.2 by 0.5 by 0.6 cm).   Volume: 3D volumetric analysis: Prostate volume 41.26 cc (4.8 by 3.8 by 4.6 cm).   Transcapsular spread:  Absent   Seminal vesicle involvement: Absent   Neurovascular bundle involvement:  Absent   Pelvic adenopathy: Absent   Bone metastasis: Absent   Other findings: Sigmoid colon diverticulosis.   IMPRESSION: 1. Two PI-RADS category 3 lesions are identified in the left peripheral zone. Targeting data sent to [facility]. 2. Sigmoid colon diverticulosis.     Electronically Signed   By: Gaylyn Rong M.D.   On: 05/12/2023 11:28   Assessment & Plan:    1. Elevated PSA Benign DRE He had a significant bump in his PSA between April and July and will repeat today as this may have been due to transient inflammation.  Prostate MRI showed no lesion suspicious for high-grade prostate cancer. We discussed PI-RADS 3 lesions are considered indeterminate. Further recommendations pending review of his repeat PSA.   2. Microhematuria He has had 2 abnormal UAs showing >3 RBCs. Would recommend further evaluation with CT urogram and cystoscopy, however will complete evaluation of his elevated PSA.  I have reviewed the above documentation for accuracy and completeness, and I agree with the above.   [name], MD  [facility] [address] [place], [place] [address] [phone]

## 2023-06-14 LAB — PSA: Prostate Specific Ag, Serum: 7.3 ng/mL — ABNORMAL HIGH (ref 0.0–4.0)

## 2023-06-15 ENCOUNTER — Encounter: Payer: Self-pay | Admitting: Urology

## 2023-06-16 ENCOUNTER — Telehealth: Payer: Self-pay

## 2023-06-16 NOTE — Telephone Encounter (Signed)
Pt's wife calls and states they missed a call from our office. Gave wife PSA results per DPR from Dr. Heywood Footman notes. Wife voiced understanding. Appt scheduled. Instructions mailed.

## 2023-07-07 ENCOUNTER — Ambulatory Visit: Payer: Medicare HMO | Admitting: Urology

## 2023-07-07 ENCOUNTER — Encounter: Payer: Self-pay | Admitting: Urology

## 2023-07-07 VITALS — BP 122/75 | HR 70 | Ht 66.0 in | Wt 224.0 lb

## 2023-07-07 DIAGNOSIS — R972 Elevated prostate specific antigen [PSA]: Secondary | ICD-10-CM

## 2023-07-07 DIAGNOSIS — Z2989 Encounter for other specified prophylactic measures: Secondary | ICD-10-CM

## 2023-07-07 DIAGNOSIS — C61 Malignant neoplasm of prostate: Secondary | ICD-10-CM

## 2023-07-07 DIAGNOSIS — N4232 Atypical small acinar proliferation of prostate: Secondary | ICD-10-CM | POA: Diagnosis not present

## 2023-07-07 MED ORDER — LEVOFLOXACIN 500 MG PO TABS
500.0000 mg | ORAL_TABLET | Freq: Once | ORAL | Status: AC
Start: 2023-07-07 — End: 2023-07-07
  Administered 2023-07-07: 500 mg via ORAL

## 2023-07-07 MED ORDER — GENTAMICIN SULFATE 40 MG/ML IJ SOLN
80.0000 mg | Freq: Once | INTRAMUSCULAR | Status: AC
Start: 2023-07-07 — End: 2023-07-07
  Administered 2023-07-07: 80 mg via INTRAMUSCULAR

## 2023-07-07 NOTE — Progress Notes (Signed)
   Prostate Biopsy Procedure   Informed consent was obtained after discussing risks/benefits of the procedure.  A time out was performed to ensure correct patient identity.  Pre-Procedure: - Last PSA Level: 11.11 04/2023; 7.3 06/2023 - MRI no high-grade lesions; PI-RADS 3 x 2 - Gentamicin given prophylactically - Levaquin 500 mg administered PO -Transrectal Ultrasound performed revealing a 28 gm prostate -Hypoechoic area posterior to lateral and anterior PZ mid gland  Procedure: - Prostate block performed using 10 cc 1% lidocaine and biopsies taken from sextant areas, a total of 12 under ultrasound guidance.  Cognitive biopsies performed the left mid, left lateral mid  Post-Procedure: - Patient tolerated the procedure well - He was counseled to seek immediate medical attention if experiences any severe pain, significant bleeding, or fevers - Return in one week to discuss biopsy results  Irineo Axon, MD

## 2023-07-30 ENCOUNTER — Ambulatory Visit: Payer: Medicare HMO | Admitting: Urology

## 2023-07-30 ENCOUNTER — Encounter: Payer: Self-pay | Admitting: Urology

## 2023-07-30 VITALS — BP 147/81 | HR 62 | Ht 66.0 in | Wt 242.0 lb

## 2023-07-30 DIAGNOSIS — C61 Malignant neoplasm of prostate: Secondary | ICD-10-CM

## 2023-07-30 NOTE — Progress Notes (Signed)
I, Bradley Bowers, acting as a scribe for Bradley Altes, MD., have documented all relevant documentation on the behalf of Bradley Altes, MD, as directed by Bradley Altes, MD while in the presence of Bradley Altes, MD.  07/30/2023 5:37 PM   Bradley Bowers 1945-12-17 [id number]  Referring provider: Danella Penton, MD [phone] [facility] [address] [facility] [place],  [place] [address]  Chief Complaint  Patient presents with   Results   Urologic history: 1. Elevated PSA He had a significant bump in his PSA between April and July Prostate MRI showed no lesions suspicious for high-grade prostate cancer. We discussed PI-RADS 3 lesions are considered indeterminate.  2. Microhematuria 2 abnormal UAs  HPI: Bradley Bowers is a 77 y.o. male presents for prostate biopsy follow-up  Prostate biopsy performed 07/07/2023 for a PSA of 11.1. and prostate MRI showing no high-grade lesions, but PI-RADS 3 lesions x2. Benign DRE. No evidence of transcapsular spread, seminal vesicle involvement, adenopathy, or bony mets on prostate MRI. Prostate volume by ultrasound was 28 grams. He had no post-biopsy complaint.  4/6 left-sided cores with Gleason 4+4/4+5 adenocarcinoma with tissue involvement ranging from 7-21%; all left-sided cores were benign.   PMH: Past Medical History:  Diagnosis Date   Arterial vascular disease 10/24/2014   Overview:  MI 1994, small-vessel disease requiring coumadin    Asthma    Centrilobular emphysema (HCC) 10/24/2014   Overview:  2L O2 prn    Centrilobular emphysema (HCC)    Chronic obstructive pulmonary disease (HCC) 11/06/2015   Coronary artery disease    GERD (gastroesophageal reflux disease)    Glaucoma    Heart attack (HCC) 2/17, 1994   Heart disease    Hyperlipemia    Hypertension    Restless leg 10/24/2014   Sleep apnea     Surgical History: Past Surgical History:  Procedure Laterality Date   CATARACT EXTRACTION W/  INTRAOCULAR LENS  IMPLANT, BILATERAL     COLONOSCOPY WITH PROPOFOL N/A 02/19/2018   Procedure: COLONOSCOPY WITH PROPOFOL;  Surgeon: Christena Deem, MD;  Location: [facility] ENDOSCOPY;  Service: Endoscopy;  Laterality: N/A;   ESOPHAGOGASTRODUODENOSCOPY (EGD) WITH PROPOFOL N/A 02/19/2018   Procedure: ESOPHAGOGASTRODUODENOSCOPY (EGD) WITH PROPOFOL;  Surgeon: Christena Deem, MD;  Location: [facility] ENDOSCOPY;  Service: Endoscopy;  Laterality: N/A;   EYE SURGERY      Home Medications:  Allergies as of 07/30/2023   No Known Allergies      Medication List        Accurate as of July 30, 2023  5:37 PM. If you have any questions, ask your nurse or doctor.          albuterol 108 (90 Base) MCG/ACT inhaler Commonly known as: VENTOLIN HFA Inhale 1-2 puffs into the lungs every 4 (four) hours as needed.   aspirin EC 81 MG tablet Take 81 mg by mouth daily.   atorvastatin 80 MG tablet Commonly known as: LIPITOR Take 80 mg by mouth daily.   carbamazepine 200 MG tablet Commonly known as: TEGRETOL Take 1 tablet by mouth 2 (two) times daily.   cholecalciferol 25 MCG (1000 UNIT) tablet Commonly known as: VITAMIN D3 Take 1,000 Units by mouth daily.   clopidogrel 75 MG tablet Commonly known as: PLAVIX Take 75 mg by mouth daily.   cyanocobalamin 1000 MCG tablet Commonly known as: VITAMIN B12 Take 1,000 mcg by mouth daily.   diltiazem 180 MG 24 hr capsule Commonly known as: CARDIZEM  CD Take 180 mg by mouth daily.   divalproex 500 MG 24 hr tablet Commonly known as: DEPAKOTE ER Take 500 mg by mouth daily.   donepezil 10 MG tablet Commonly known as: ARICEPT Take 1 tablet by mouth at bedtime.   feeding supplement Liqd Take 237 mLs by mouth 2 (two) times daily between meals.   fluticasone 50 MCG/ACT nasal spray Commonly known as: FLONASE Place 1 spray into both nostrils daily.   mometasone-formoterol 100-5 MCG/ACT Aero Commonly known as: DULERA Inhale 2 puffs into the lungs 2  (two) times daily.   montelukast 10 MG tablet Commonly known as: SINGULAIR Take 10 mg by mouth at bedtime.   pantoprazole 40 MG tablet Commonly known as: PROTONIX Take 40 mg by mouth 2 (two) times daily.   polyethylene glycol 17 g packet Commonly known as: MIRALAX / GLYCOLAX Take 17 g by mouth daily as needed.   spironolactone 25 MG tablet Commonly known as: ALDACTONE Take 1 tablet by mouth daily.   timolol 0.5 % ophthalmic solution Commonly known as: TIMOPTIC 1 drop 2 (two) times daily.   tiotropium 18 MCG inhalation capsule Commonly known as: SPIRIVA Place 18 mcg into inhaler and inhale daily.   Travatan Z 0.004 % Soln ophthalmic solution Generic drug: Travoprost (BAK Free) Place 1 drop into both eyes at bedtime.        Allergies: No Known Allergies  Family History: Family History  Problem Relation Age of Onset   Cancer Mother    Nephrolithiasis Father    Prostate cancer Neg Hx    Bladder Cancer Neg Hx    Kidney cancer Neg Hx     Social History:  reports that he quit smoking about 29 years ago. His smoking use included cigarettes. He started smoking about 59 years ago. He has a 30 pack-year smoking history. He has never used smokeless tobacco. He reports that he does not drink alcohol and does not use drugs.   Physical Exam: BP (!) 147/81   Pulse 62   Ht 5\' 6"  (1.676 m)   Wt 242 lb (109.8 kg)   BMI 39.06 kg/m   Constitutional:  Alert and oriented, No acute distress. HEENT: [place] AT Respiratory: Normal respiratory effort, no increased work of breathing. Psychiatric: Normal mood and affect.   Assessment & Plan:    1. T1c adenocarcinoma of the prostate NCCN risk stratification: high PSMA/PET for staging.  Based on age and comorbidities, if no evidence of metastatic disease, recommend radiation treatment. Will go ahead and place radiation oncology referral pending PSMA PET.  All questions were answered.   I have reviewed the above documentation for  accuracy and completeness, and I agree with the above.   Bradley Altes, MD  [facility] [address], [address] [place], [place] [address] [phone]

## 2023-07-31 ENCOUNTER — Encounter: Payer: Self-pay | Admitting: Urology

## 2023-08-13 ENCOUNTER — Ambulatory Visit
Admission: RE | Admit: 2023-08-13 | Discharge: 2023-08-13 | Disposition: A | Discharge status: Home / Self Care | Payer: Medicare HMO | Source: Ambulatory Visit | Attending: Radiation Oncology | Admitting: Radiation Oncology

## 2023-08-13 ENCOUNTER — Encounter: Payer: Self-pay | Admitting: Radiation Oncology

## 2023-08-13 VITALS — BP 130/77 | HR 62 | Temp 96.9°F | Resp 16 | Ht 66.0 in

## 2023-08-13 DIAGNOSIS — K219 Gastro-esophageal reflux disease without esophagitis: Secondary | ICD-10-CM | POA: Insufficient documentation

## 2023-08-13 DIAGNOSIS — Z87891 Personal history of nicotine dependence: Secondary | ICD-10-CM | POA: Diagnosis not present

## 2023-08-13 DIAGNOSIS — I10 Essential (primary) hypertension: Secondary | ICD-10-CM | POA: Diagnosis not present

## 2023-08-13 DIAGNOSIS — H409 Unspecified glaucoma: Secondary | ICD-10-CM | POA: Insufficient documentation

## 2023-08-13 DIAGNOSIS — C61 Malignant neoplasm of prostate: Secondary | ICD-10-CM | POA: Diagnosis not present

## 2023-08-13 DIAGNOSIS — E785 Hyperlipidemia, unspecified: Secondary | ICD-10-CM | POA: Diagnosis not present

## 2023-08-13 DIAGNOSIS — J432 Centrilobular emphysema: Secondary | ICD-10-CM | POA: Diagnosis not present

## 2023-08-13 DIAGNOSIS — Z7982 Long term (current) use of aspirin: Secondary | ICD-10-CM | POA: Diagnosis not present

## 2023-08-13 DIAGNOSIS — G4730 Sleep apnea, unspecified: Secondary | ICD-10-CM | POA: Insufficient documentation

## 2023-08-13 DIAGNOSIS — Z993 Dependence on wheelchair: Secondary | ICD-10-CM | POA: Insufficient documentation

## 2023-08-13 DIAGNOSIS — Z7902 Long term (current) use of antithrombotics/antiplatelets: Secondary | ICD-10-CM | POA: Diagnosis not present

## 2023-08-13 DIAGNOSIS — Z79899 Other long term (current) drug therapy: Secondary | ICD-10-CM | POA: Diagnosis not present

## 2023-08-13 DIAGNOSIS — Z9981 Dependence on supplemental oxygen: Secondary | ICD-10-CM | POA: Insufficient documentation

## 2023-08-13 DIAGNOSIS — I2510 Atherosclerotic heart disease of native coronary artery without angina pectoris: Secondary | ICD-10-CM | POA: Insufficient documentation

## 2023-08-13 DIAGNOSIS — Z7951 Long term (current) use of inhaled steroids: Secondary | ICD-10-CM | POA: Diagnosis not present

## 2023-08-13 DIAGNOSIS — I252 Old myocardial infarction: Secondary | ICD-10-CM | POA: Diagnosis not present

## 2023-08-13 DIAGNOSIS — G2581 Restless legs syndrome: Secondary | ICD-10-CM | POA: Diagnosis not present

## 2023-08-13 DIAGNOSIS — Z191 Hormone sensitive malignancy status: Secondary | ICD-10-CM | POA: Diagnosis not present

## 2023-08-13 NOTE — Consult Note (Signed)
NEW PATIENT EVALUATION  Name: Bradley Bowers  MRN: [id number]  Date:   08/13/2023     DOB: 1946-06-07   This 77 y.o. male patient presents to the clinic for initial evaluation of stage IIIc (cT1 cN0 M0) Gleason 9 (4+5) adenocarcinoma the prostate presenting with a PSA in the 11 range.  REFERRING PHYSICIAN: Danella Penton, MD  CHIEF COMPLAINT:  Chief Complaint  Patient presents with   Prostate Cancer    DIAGNOSIS: The encounter diagnosis was Malignant neoplasm of prostate (HCC).   PREVIOUS INVESTIGATIONS:  MRI of prostate reviewed PSMA PET scan pending Pathology reports reviewed Clinical notes reviewed  HPI: Patient is a 77 year old male who presented with a PSA that jumped to 11.1.  He was fairly asymptomatic.  MRI scan was ordered showing 2 PI-RADS category 3 lesions in the left peripheral zone.  There is no evidence of pelvic adenopathy bone metastasis seminal vesicle involvement or transscapular spread.  He went on to have biopsy of his prostate showing 4 out of 12 cores from the left side of his prostate mostly Gleason 9 (4+5) as well as Gleason 8 (4+4).  PSMA PET scan has been ordered.  Patient is from a urologic standpoint doing well specifically denies any urinary frequency urgency nocturia.  He is having no bowel problems.  He has no bone pain.  Patient is wheelchair-bound and oxygen dependent.  He is now referred to radiation oncology for consideration of treatment.  PLANNED TREATMENT REGIMEN: Possible IMRT image guided radiation therapy  PAST MEDICAL HISTORY:  has a past medical history of Arterial vascular disease (10/24/2014), Asthma, Centrilobular emphysema (HCC) (10/24/2014), Centrilobular emphysema (HCC), Chronic obstructive pulmonary disease (HCC) (11/06/2015), Coronary artery disease, GERD (gastroesophageal reflux disease), Glaucoma, Heart attack (HCC) (2/17, 1994), Heart disease, Hyperlipemia, Hypertension, Restless leg (10/24/2014), and Sleep apnea.    PAST SURGICAL  HISTORY:  Past Surgical History:  Procedure Laterality Date   CATARACT EXTRACTION W/ INTRAOCULAR LENS  IMPLANT, BILATERAL     COLONOSCOPY WITH PROPOFOL N/A 02/19/2018   Procedure: COLONOSCOPY WITH PROPOFOL;  Surgeon: Christena Deem, MD;  Location: [facility] ENDOSCOPY;  Service: Endoscopy;  Laterality: N/A;   ESOPHAGOGASTRODUODENOSCOPY (EGD) WITH PROPOFOL N/A 02/19/2018   Procedure: ESOPHAGOGASTRODUODENOSCOPY (EGD) WITH PROPOFOL;  Surgeon: Christena Deem, MD;  Location: [facility] ENDOSCOPY;  Service: Endoscopy;  Laterality: N/A;   EYE SURGERY      FAMILY HISTORY: family history includes Cancer in his mother; Nephrolithiasis in his father.  SOCIAL HISTORY:  reports that he quit smoking about 29 years ago. His smoking use included cigarettes. He started smoking about 59 years ago. He has a 30 pack-year smoking history. He has never used smokeless tobacco. He reports that he does not drink alcohol and does not use drugs.  ALLERGIES: Patient has no known allergies.  MEDICATIONS:  Current Outpatient Medications  Medication Sig Dispense Refill   albuterol (VENTOLIN HFA) 108 (90 Base) MCG/ACT inhaler Inhale 1-2 puffs into the lungs every 4 (four) hours as needed.     aspirin 81 MG EC tablet Take 81 mg by mouth daily.  11   atorvastatin (LIPITOR) 80 MG tablet Take 80 mg by mouth daily.     carbamazepine (TEGRETOL) 200 MG tablet Take 1 tablet by mouth 2 (two) times daily.     cholecalciferol (VITAMIN D3) 25 MCG (1000 UNIT) tablet Take 1,000 Units by mouth daily.     clopidogrel (PLAVIX) 75 MG tablet Take 75 mg by mouth daily.     cyanocobalamin (VITAMIN B12)  1000 MCG tablet Take 1,000 mcg by mouth daily.     diltiazem (CARDIZEM CD) 120 MG 24 hr capsule Take 120 mg by mouth daily.     divalproex (DEPAKOTE ER) 500 MG 24 hr tablet Take 500 mg by mouth daily.     donepezil (ARICEPT) 10 MG tablet Take 1 tablet by mouth at bedtime.     feeding supplement (ENSURE ENLIVE / ENSURE PLUS) LIQD Take 237 mLs by  mouth 2 (two) times daily between meals. 237 mL 12   fluticasone (FLONASE) 50 MCG/ACT nasal spray Place 1 spray into both nostrils daily.     mometasone-formoterol (DULERA) 100-5 MCG/ACT AERO Inhale 2 puffs into the lungs 2 (two) times daily. 1 each    montelukast (SINGULAIR) 10 MG tablet Take 10 mg by mouth at bedtime.     pantoprazole (PROTONIX) 40 MG tablet Take 40 mg by mouth 2 (two) times daily.     polyethylene glycol (MIRALAX / GLYCOLAX) 17 g packet Take 17 g by mouth daily as needed. 14 each 0   spironolactone (ALDACTONE) 25 MG tablet Take 1 tablet by mouth daily.     timolol (TIMOPTIC) 0.5 % ophthalmic solution 1 drop 2 (two) times daily.     tiotropium (SPIRIVA) 18 MCG inhalation capsule Place 18 mcg into inhaler and inhale daily.     TRAVATAN Z 0.004 % SOLN ophthalmic solution Place 1 drop into both eyes at bedtime.     No current facility-administered medications for this encounter.    ECOG PERFORMANCE STATUS:  0 - Asymptomatic  REVIEW OF SYSTEMS: Patient has arteriovascular disease asthma central lobar asthma Seema COPD coronary artery disease GERD previous heart attack and 89 Patient denies any weight loss, fatigue, weakness, fever, chills or night sweats. Patient denies any loss of vision, blurred vision. Patient denies any ringing  of the ears or hearing loss. No irregular heartbeat. Patient denies heart murmur or history of fainting. Patient denies any chest pain or pain radiating to her upper extremities. Patient denies any shortness of breath, difficulty breathing at night, cough or hemoptysis. Patient denies any swelling in the lower legs. Patient denies any nausea vomiting, vomiting of blood, or coffee ground material in the vomitus. Patient denies any stomach pain. Patient states has had normal bowel movements no significant constipation or diarrhea. Patient denies any dysuria, hematuria or significant nocturia. Patient denies any problems walking, swelling in the joints or  loss of balance. Patient denies any skin changes, loss of hair or loss of weight. Patient denies any excessive worrying or anxiety or significant depression. Patient denies any problems with insomnia. Patient denies excessive thirst, polyuria, polydipsia. Patient denies any swollen glands, patient denies easy bruising or easy bleeding. Patient denies any recent infections, allergies or URI. Patient "s visual fields have not changed significantly in recent time.   PHYSICAL EXAM: BP 130/77   Pulse 62   Temp (!) 96.9 F (36.1 C)   Resp 16   Ht 5\' 6"  (1.676 m)   BMI 39.06 kg/m  Wheelchair-bound male in NAD on nasal oxygen.  Well-developed well-nourished patient in NAD. HEENT reveals PERLA, EOMI, discs not visualized.  Oral cavity is clear. No oral mucosal lesions are identified. Neck is clear without evidence of cervical or supraclavicular adenopathy. Lungs are clear to A&P. Cardiac examination is essentially unremarkable with regular rate and rhythm without murmur rub or thrill. Abdomen is benign with no organomegaly or masses noted. Motor sensory and DTR levels are equal and symmetric in the  upper and lower extremities. Cranial nerves II through XII are grossly intact. Proprioception is intact. No peripheral adenopathy or edema is identified. No motor or sensory levels are noted. Crude visual fields are within normal range.  LABORATORY DATA: Pathology reports reviewed    RADIOLOGY RESULTS: MRI scan of prostate and pelvis reviewed compatible with above-stated findings. PSMA PET scan has been ordered   IMPRESSION: At least stage IIIc Gleason 9 (4+5) adenocarcinoma the prostate presenting with a PSA in the 11 range in 77 year old male  PLAN: At this time will await PSMA PET scan.  I have tentatively scheduled him to have fiducial markers placed by urology.  I have also asked them to start him on ADT therapy with Eligard.  I am hesitant to treat his pelvic lymph nodes based on his overall general  status although [facility] nomogram shows approximately 30% chance of nodal involvement.  Will make that decision final when I review his PSMA PET scan.  Will set up a simulation appointment after that is completed.  Risks and benefits of treatment occluding increased lower urinary tract symptoms diarrhea fatigue alteration blood counts skin reaction all were reviewed with the patient.  He comprehends my treatment plan well.  I would like to take this opportunity to thank you for allowing me to participate in the care of your patient.Carmina Miller, MD

## 2023-08-22 ENCOUNTER — Telehealth: Payer: Self-pay

## 2023-08-22 NOTE — Telephone Encounter (Signed)
-----   Message from Nurse Cala Bradford D sent at 08/13/2023  1:48 PM EST ----- Regarding: Eligard and Markers placed. Good Afternoon ,  This patient will need to be scheduled for Eligard and to have Markers placed.  Thank you, Ricki Rodriguez

## 2023-08-22 NOTE — Telephone Encounter (Signed)
No PA required for Eligard.  

## 2023-08-25 ENCOUNTER — Ambulatory Visit
Admission: RE | Admit: 2023-08-25 | Discharge: 2023-08-25 | Disposition: A | Discharge status: Home / Self Care | Payer: Medicare HMO | Source: Ambulatory Visit | Attending: Urology | Admitting: Urology

## 2023-08-25 DIAGNOSIS — C61 Malignant neoplasm of prostate: Secondary | ICD-10-CM | POA: Insufficient documentation

## 2023-08-25 MED ORDER — FLOTUFOLASTAT F 18 GALLIUM 296-5846 MBQ/ML IV SOLN
8.0000 | Freq: Once | INTRAVENOUS | Status: AC
  Administered 2023-08-25: 8.62 via INTRAVENOUS
  Filled 2023-08-25: qty 8

## 2023-09-01 ENCOUNTER — Encounter: Payer: Self-pay | Admitting: Urology

## 2023-09-01 ENCOUNTER — Ambulatory Visit: Payer: Medicare HMO | Admitting: Urology

## 2023-09-01 VITALS — BP 151/75 | HR 77 | Ht 66.0 in | Wt 183.0 lb

## 2023-09-01 DIAGNOSIS — C61 Malignant neoplasm of prostate: Secondary | ICD-10-CM | POA: Diagnosis not present

## 2023-09-01 DIAGNOSIS — Z2989 Encounter for other specified prophylactic measures: Secondary | ICD-10-CM

## 2023-09-01 MED ORDER — LEVOFLOXACIN 500 MG PO TABS
500.0000 mg | ORAL_TABLET | Freq: Once | ORAL | Status: AC
Start: 2023-09-01 — End: 2023-09-01
  Administered 2023-09-01: 500 mg via ORAL

## 2023-09-01 MED ORDER — GENTAMICIN SULFATE 40 MG/ML IJ SOLN
80.0000 mg | Freq: Once | INTRAMUSCULAR | Status: AC
Start: 2023-09-01 — End: 2023-09-01
  Administered 2023-09-01: 80 mg via INTRAMUSCULAR

## 2023-09-01 MED ORDER — LEUPROLIDE ACETATE (6 MONTH) 45 MG ~~LOC~~ KIT
45.0000 mg | PACK | Freq: Once | SUBCUTANEOUS | Status: AC
  Administered 2023-09-01: 45 mg via SUBCUTANEOUS

## 2023-09-01 NOTE — Progress Notes (Signed)
Eligard SubQ Injection   Due to Prostate Cancer patient is present today for a Eligard Injection.  Medication: Eligard 6 month Dose: 45 mg  Location: left  Lot: [id number] Exp: 12/29/2024  Patient tolerated well, no complications were noted  Performed by: Ples Specter CMA  Per Dr. Lonna Cobb  Dr . Aggie Cosier note is not finished This appointment was scheduled using wheel and given to patient today along with reminder continue on Vitamin D 800-1000iu and Calcium 1000-1200mg  daily while on Androgen Deprivation Therapy.  PA approval dates:

## 2023-09-01 NOTE — Progress Notes (Signed)
   09/01/23  CC: gold fiducial marker placement  HPI: 77 y.o. male with prostate cancer who presents today for placement of fiducial markers in anticipation of his upcoming IMRT with Dr. Rushie Chestnut.  Prostate Gold fiducial Marker Placement Procedure   Informed consent was obtained after discussing risks/benefits of the procedure.  A time out was performed to ensure correct patient identity.  Pre-Procedure: - Gentamicin given prophylactically - PO Levaquin 500 mg also given today  Procedure: - Rectal ultrasound probe was placed without difficulty and the prostate visualized - Prostatic block performed with 10 mL 1% Xylocaine - 3 fiducial gold seed markers placed, one at right base, one at left base, one at apex of prostate gland under transrectal ultrasound guidance  Post-Procedure: - Patient tolerated the procedure well - He was counseled to seek immediate medical attention if experiences any severe pain, significant bleeding, or fevers - ADT was also recommended by radiation oncology and received a 64-month leuprolide depot injection.  We discussed most common side effects of hot flashes, tiredness and fatigue    Irineo Axon, MD

## 2023-09-02 DIAGNOSIS — C61 Malignant neoplasm of prostate: Secondary | ICD-10-CM | POA: Diagnosis not present

## 2023-09-02 DIAGNOSIS — Z191 Hormone sensitive malignancy status: Secondary | ICD-10-CM | POA: Diagnosis not present

## 2023-09-03 ENCOUNTER — Inpatient Hospital Stay
Admission: EM | Admit: 2023-09-03 | Discharge: 2023-09-07 | DRG: 871 | Disposition: A | Discharge status: Home Health | Payer: Medicare HMO | Attending: Internal Medicine | Admitting: Internal Medicine

## 2023-09-03 ENCOUNTER — Other Ambulatory Visit: Payer: Self-pay | Admitting: Radiation Oncology

## 2023-09-03 ENCOUNTER — Other Ambulatory Visit: Payer: Self-pay

## 2023-09-03 ENCOUNTER — Inpatient Hospital Stay: Payer: Medicare HMO

## 2023-09-03 ENCOUNTER — Emergency Department: Payer: Medicare HMO

## 2023-09-03 DIAGNOSIS — Z7982 Long term (current) use of aspirin: Secondary | ICD-10-CM

## 2023-09-03 DIAGNOSIS — I10 Essential (primary) hypertension: Secondary | ICD-10-CM | POA: Diagnosis present

## 2023-09-03 DIAGNOSIS — G9341 Metabolic encephalopathy: Secondary | ICD-10-CM | POA: Diagnosis present

## 2023-09-03 DIAGNOSIS — Z8489 Family history of other specified conditions: Secondary | ICD-10-CM

## 2023-09-03 DIAGNOSIS — Z809 Family history of malignant neoplasm, unspecified: Secondary | ICD-10-CM

## 2023-09-03 DIAGNOSIS — R4701 Aphasia: Secondary | ICD-10-CM | POA: Insufficient documentation

## 2023-09-03 DIAGNOSIS — G629 Polyneuropathy, unspecified: Secondary | ICD-10-CM

## 2023-09-03 DIAGNOSIS — Z1152 Encounter for screening for COVID-19: Secondary | ICD-10-CM | POA: Diagnosis not present

## 2023-09-03 DIAGNOSIS — Z9981 Dependence on supplemental oxygen: Secondary | ICD-10-CM | POA: Diagnosis not present

## 2023-09-03 DIAGNOSIS — Z7951 Long term (current) use of inhaled steroids: Secondary | ICD-10-CM

## 2023-09-03 DIAGNOSIS — K219 Gastro-esophageal reflux disease without esophagitis: Secondary | ICD-10-CM | POA: Diagnosis present

## 2023-09-03 DIAGNOSIS — Z87891 Personal history of nicotine dependence: Secondary | ICD-10-CM | POA: Diagnosis not present

## 2023-09-03 DIAGNOSIS — A419 Sepsis, unspecified organism: Principal | ICD-10-CM | POA: Diagnosis present

## 2023-09-03 DIAGNOSIS — G4733 Obstructive sleep apnea (adult) (pediatric): Secondary | ICD-10-CM | POA: Diagnosis present

## 2023-09-03 DIAGNOSIS — J9611 Chronic respiratory failure with hypoxia: Secondary | ICD-10-CM | POA: Diagnosis present

## 2023-09-03 DIAGNOSIS — C61 Malignant neoplasm of prostate: Secondary | ICD-10-CM | POA: Diagnosis not present

## 2023-09-03 DIAGNOSIS — R6520 Severe sepsis without septic shock: Secondary | ICD-10-CM | POA: Diagnosis present

## 2023-09-03 DIAGNOSIS — I2510 Atherosclerotic heart disease of native coronary artery without angina pectoris: Secondary | ICD-10-CM | POA: Diagnosis present

## 2023-09-03 DIAGNOSIS — E785 Hyperlipidemia, unspecified: Secondary | ICD-10-CM | POA: Diagnosis present

## 2023-09-03 DIAGNOSIS — Z79899 Other long term (current) drug therapy: Secondary | ICD-10-CM

## 2023-09-03 DIAGNOSIS — J438 Other emphysema: Secondary | ICD-10-CM | POA: Diagnosis not present

## 2023-09-03 DIAGNOSIS — E669 Obesity, unspecified: Secondary | ICD-10-CM | POA: Diagnosis not present

## 2023-09-03 DIAGNOSIS — R54 Age-related physical debility: Secondary | ICD-10-CM | POA: Diagnosis present

## 2023-09-03 DIAGNOSIS — T8144XA Sepsis following a procedure, initial encounter: Secondary | ICD-10-CM | POA: Diagnosis present

## 2023-09-03 DIAGNOSIS — F312 Bipolar disorder, current episode manic severe with psychotic features: Secondary | ICD-10-CM | POA: Diagnosis present

## 2023-09-03 DIAGNOSIS — I959 Hypotension, unspecified: Secondary | ICD-10-CM | POA: Diagnosis not present

## 2023-09-03 DIAGNOSIS — Z8673 Personal history of transient ischemic attack (TIA), and cerebral infarction without residual deficits: Secondary | ICD-10-CM

## 2023-09-03 DIAGNOSIS — R29818 Other symptoms and signs involving the nervous system: Secondary | ICD-10-CM | POA: Diagnosis present

## 2023-09-03 DIAGNOSIS — G309 Alzheimer's disease, unspecified: Secondary | ICD-10-CM | POA: Diagnosis present

## 2023-09-03 DIAGNOSIS — F0283 Dementia in other diseases classified elsewhere, unspecified severity, with mood disturbance: Secondary | ICD-10-CM | POA: Diagnosis present

## 2023-09-03 DIAGNOSIS — R000 Tachycardia, unspecified: Secondary | ICD-10-CM | POA: Diagnosis not present

## 2023-09-03 DIAGNOSIS — N3001 Acute cystitis with hematuria: Secondary | ICD-10-CM | POA: Diagnosis present

## 2023-09-03 DIAGNOSIS — Z0389 Encounter for observation for other suspected diseases and conditions ruled out: Secondary | ICD-10-CM | POA: Diagnosis not present

## 2023-09-03 DIAGNOSIS — R0689 Other abnormalities of breathing: Secondary | ICD-10-CM | POA: Diagnosis not present

## 2023-09-03 DIAGNOSIS — I6782 Cerebral ischemia: Secondary | ICD-10-CM | POA: Diagnosis not present

## 2023-09-03 DIAGNOSIS — C801 Malignant (primary) neoplasm, unspecified: Secondary | ICD-10-CM

## 2023-09-03 DIAGNOSIS — G2581 Restless legs syndrome: Secondary | ICD-10-CM | POA: Diagnosis present

## 2023-09-03 DIAGNOSIS — J4489 Other specified chronic obstructive pulmonary disease: Secondary | ICD-10-CM | POA: Diagnosis present

## 2023-09-03 DIAGNOSIS — J9612 Chronic respiratory failure with hypercapnia: Secondary | ICD-10-CM | POA: Diagnosis present

## 2023-09-03 DIAGNOSIS — Z7902 Long term (current) use of antithrombotics/antiplatelets: Secondary | ICD-10-CM

## 2023-09-03 DIAGNOSIS — R531 Weakness: Secondary | ICD-10-CM | POA: Diagnosis not present

## 2023-09-03 DIAGNOSIS — Z6829 Body mass index (BMI) 29.0-29.9, adult: Secondary | ICD-10-CM

## 2023-09-03 DIAGNOSIS — I639 Cerebral infarction, unspecified: Secondary | ICD-10-CM | POA: Diagnosis present

## 2023-09-03 DIAGNOSIS — J449 Chronic obstructive pulmonary disease, unspecified: Secondary | ICD-10-CM | POA: Diagnosis present

## 2023-09-03 DIAGNOSIS — R509 Fever, unspecified: Secondary | ICD-10-CM | POA: Diagnosis not present

## 2023-09-03 DIAGNOSIS — N390 Urinary tract infection, site not specified: Secondary | ICD-10-CM | POA: Diagnosis present

## 2023-09-03 LAB — CBC WITH DIFFERENTIAL/PLATELET
Abs Immature Granulocytes: 0.09 10*3/uL — ABNORMAL HIGH (ref 0.00–0.07)
Basophils Absolute: 0 10*3/uL (ref 0.0–0.1)
Basophils Relative: 0 %
Eosinophils Absolute: 0 10*3/uL (ref 0.0–0.5)
Eosinophils Relative: 0 %
HCT: 36.7 % — ABNORMAL LOW (ref 39.0–52.0)
Hemoglobin: 12.5 g/dL — ABNORMAL LOW (ref 13.0–17.0)
Immature Granulocytes: 1 %
Lymphocytes Relative: 2 %
Lymphs Abs: 0.4 10*3/uL — ABNORMAL LOW (ref 0.7–4.0)
MCH: 30.8 pg (ref 26.0–34.0)
MCHC: 34.1 g/dL (ref 30.0–36.0)
MCV: 90.4 fL (ref 80.0–100.0)
Monocytes Absolute: 0.2 10*3/uL (ref 0.1–1.0)
Monocytes Relative: 1 %
Neutro Abs: 16.2 10*3/uL — ABNORMAL HIGH (ref 1.7–7.7)
Neutrophils Relative %: 96 %
Platelets: 149 10*3/uL — ABNORMAL LOW (ref 150–400)
RBC: 4.06 MIL/uL — ABNORMAL LOW (ref 4.22–5.81)
RDW: 11.7 % (ref 11.5–15.5)
WBC: 16.9 10*3/uL — ABNORMAL HIGH (ref 4.0–10.5)
nRBC: 0 % (ref 0.0–0.2)

## 2023-09-03 LAB — URINALYSIS, W/ REFLEX TO CULTURE (INFECTION SUSPECTED)
Bilirubin Urine: NEGATIVE
Glucose, UA: NEGATIVE mg/dL
Ketones, ur: NEGATIVE mg/dL
Nitrite: NEGATIVE
Protein, ur: NEGATIVE mg/dL
RBC / HPF: >50 RBC/hpf (ref 0–5)
Specific Gravity, Urine: 1.013 (ref 1.005–1.030)
Squamous Epithelial / HPF: 0 /[HPF] (ref 0–5)
WBC, UA: >50 WBC/hpf (ref 0–5)
pH: 7 (ref 5.0–8.0)

## 2023-09-03 LAB — COMPREHENSIVE METABOLIC PANEL
ALT: 18 U/L (ref 0–44)
AST: 20 U/L (ref 15–41)
Albumin: 3.3 g/dL — ABNORMAL LOW (ref 3.5–5.0)
Alkaline Phosphatase: 61 U/L (ref 38–126)
Anion gap: 6 (ref 5–15)
BUN: 20 mg/dL (ref 8–23)
CO2: 28 mmol/L (ref 22–32)
Calcium: 8.5 mg/dL — ABNORMAL LOW (ref 8.9–10.3)
Chloride: 98 mmol/L (ref 98–111)
Creatinine, Ser: 1.18 mg/dL (ref 0.61–1.24)
GFR, Estimated: >60 mL/min (ref >60)
Glucose, Bld: 106 mg/dL — ABNORMAL HIGH (ref 70–99)
Potassium: 3.9 mmol/L (ref 3.5–5.1)
Sodium: 132 mmol/L — ABNORMAL LOW (ref 135–145)
Total Bilirubin: 0.9 mg/dL (ref <1.2)
Total Protein: 6.9 g/dL (ref 6.5–8.1)

## 2023-09-03 LAB — PROCALCITONIN: Procalcitonin: 3.8 ng/mL

## 2023-09-03 LAB — LACTIC ACID, PLASMA
Lactic Acid, Venous: 1.8 mmol/L (ref 0.5–1.9)
Lactic Acid, Venous: 1.3 mmol/L (ref 0.5–1.9)

## 2023-09-03 LAB — RESP PANEL BY RT-PCR (RSV, FLU A&B, COVID)  RVPGX2
Influenza A by PCR: NEGATIVE
Influenza B by PCR: NEGATIVE
Resp Syncytial Virus by PCR: NEGATIVE
SARS Coronavirus 2 by RT PCR: NEGATIVE

## 2023-09-03 LAB — PROTIME-INR
INR: 1.1 (ref 0.8–1.2)
Prothrombin Time: 14.4 s (ref 11.4–15.2)

## 2023-09-03 LAB — APTT: aPTT: 27 s (ref 24–36)

## 2023-09-03 MED ORDER — ONDANSETRON HCL 4 MG PO TABS: 4.0000 mg | ORAL_TABLET | Freq: Four times a day (QID) | ORAL | Status: DC | PRN

## 2023-09-03 MED ORDER — VANCOMYCIN HCL 1250 MG/250ML IV SOLN
1250.0000 mg | INTRAVENOUS | Status: DC
  Administered 2023-09-04: 1250 mg via INTRAVENOUS
  Filled 2023-09-03 (×2): qty 250

## 2023-09-03 MED ORDER — SODIUM CHLORIDE 0.9 % IV SOLN: 2.0000 g | Freq: Two times a day (BID) | INTRAVENOUS | Status: DC

## 2023-09-03 MED ORDER — SODIUM CHLORIDE 0.9% FLUSH
10.0000 mL | Freq: Two times a day (BID) | INTRAVENOUS | Status: DC
  Administered 2023-09-03 – 2023-09-07 (×6): 10 mL via INTRAVENOUS

## 2023-09-03 MED ORDER — VANCOMYCIN HCL 750 MG/150ML IV SOLN
750.0000 mg | Freq: Once | INTRAVENOUS | Status: AC
  Administered 2023-09-03: 750 mg via INTRAVENOUS
  Filled 2023-09-03: qty 150

## 2023-09-03 MED ORDER — METRONIDAZOLE 500 MG/100ML IV SOLN
500.0000 mg | Freq: Once | INTRAVENOUS | Status: AC
  Administered 2023-09-03: 500 mg via INTRAVENOUS
  Filled 2023-09-03: qty 100

## 2023-09-03 MED ORDER — HYDRALAZINE HCL 20 MG/ML IJ SOLN: 5.0000 mg | Freq: Four times a day (QID) | INTRAMUSCULAR | Status: DC | PRN

## 2023-09-03 MED ORDER — VANCOMYCIN HCL IN DEXTROSE 1-5 GM/200ML-% IV SOLN
1000.0000 mg | Freq: Once | INTRAVENOUS | Status: AC
  Administered 2023-09-03: 1000 mg via INTRAVENOUS
  Filled 2023-09-03: qty 200

## 2023-09-03 MED ORDER — SODIUM CHLORIDE 0.9 % IV SOLN
2.0000 g | Freq: Two times a day (BID) | INTRAVENOUS | Status: DC
  Administered 2023-09-04 – 2023-09-05 (×3): 2 g via INTRAVENOUS
  Filled 2023-09-03 (×4): qty 12.5

## 2023-09-03 MED ORDER — SENNOSIDES-DOCUSATE SODIUM 8.6-50 MG PO TABS: 1.0000 | ORAL_TABLET | Freq: Every evening | ORAL | Status: DC | PRN

## 2023-09-03 MED ORDER — LACTATED RINGERS IV SOLN
150.0000 mL/h | INTRAVENOUS | Status: DC
  Administered 2023-09-03: 150 mL/h via INTRAVENOUS

## 2023-09-03 MED ORDER — ACETAMINOPHEN 650 MG RE SUPP: 650.0000 mg | Freq: Four times a day (QID) | RECTAL | Status: DC | PRN

## 2023-09-03 MED ORDER — HEPARIN SODIUM (PORCINE) 5000 UNIT/ML IJ SOLN
5000.0000 [IU] | Freq: Three times a day (TID) | INTRAMUSCULAR | Status: DC
  Administered 2023-09-03 – 2023-09-07 (×11): 5000 [IU] via SUBCUTANEOUS
  Filled 2023-09-03 (×11): qty 1

## 2023-09-03 MED ORDER — VANCOMYCIN HCL 1250 MG/250ML IV SOLN: 1250.0000 mg | INTRAVENOUS | Status: DC

## 2023-09-03 MED ORDER — LACTATED RINGERS IV BOLUS (SEPSIS)
1000.0000 mL | Freq: Once | INTRAVENOUS | Status: AC
  Administered 2023-09-03: 1000 mL via INTRAVENOUS

## 2023-09-03 MED ORDER — ACETAMINOPHEN 325 MG PO TABS
650.0000 mg | ORAL_TABLET | Freq: Four times a day (QID) | ORAL | Status: DC | PRN
  Administered 2023-09-04 (×2): 650 mg via ORAL
  Filled 2023-09-03 (×3): qty 2

## 2023-09-03 MED ORDER — ONDANSETRON HCL 4 MG/2ML IJ SOLN: 4.0000 mg | Freq: Four times a day (QID) | INTRAMUSCULAR | Status: DC | PRN

## 2023-09-03 MED ORDER — SODIUM CHLORIDE 0.9 % IV SOLN
2.0000 g | Freq: Once | INTRAVENOUS | Status: AC
  Administered 2023-09-03: 2 g via INTRAVENOUS
  Filled 2023-09-03: qty 12.5

## 2023-09-03 NOTE — ED Provider Notes (Signed)
[facility] Provider Note    Event Date/Time   First MD Initiated Contact with Patient 09/03/23 1559     (approximate)   History   Code Sepsis   HPI Bradley Bowers is a 77 y.o. male with history of COPD on 3L chronically, CVA with no residual deficits, HTN, prostate cancer presenting today for altered mental status.  Family at home called out to EMS due to patient being altered today and more weak than normal.  With EMS, he was noted to have a fever 103 F and was tachycardic but no hypotension.  On arrival, patient is more alert after receiving fluids and able to state his name and where were at.  He notes having some nausea symptoms but otherwise denies chest pain, shortness of breath, abdominal pain, numbness, weakness, diarrhea, constipation.  Chart review: Patient was seen by urology 2 days ago for placement of fiducial markers on his prostate.  No other recent procedures.     Physical Exam   Triage Vital Signs: ED Triage Vitals [09/03/23 1557]  Encounter Vitals Group     BP      Systolic BP Percentile      Diastolic BP Percentile      Pulse      Resp      Temp      Temp src      SpO2 97 %     Weight      Height      Head Circumference      Peak Flow      Pain Score      Pain Loc      Pain Education      Exclude from Growth Chart     Most recent vital signs: Vitals:   09/03/23 1700 09/03/23 1730  BP: 125/66 130/64  Pulse: 96 (!) 105  Resp: 17 (!) 22  Temp:    SpO2: 99% 99%   Physical Exam: I have reviewed the vital signs and nursing notes. General: Awake, alert, no acute distress.  Fatigued appearing. Head:  Atraumatic, normocephalic.   ENT:  EOM intact, PERRL. Oral mucosa is pink and moist with no lesions. Neck: Neck is supple with full range of motion, No meningeal signs. Cardiovascular:  RRR, No murmurs. Peripheral pulses palpable and equal bilaterally. Respiratory:  Symmetrical chest wall expansion.  Mild end expiratory  wheeze noted throughout.  Good air movement throughout.  No use of accessory muscles.   Musculoskeletal:  No cyanosis or edema. Moving extremities with full ROM Abdomen:  Soft, nontender, nondistended. Neuro:  GCS 15, moving all four extremities, interacting appropriately. Speech clear. Psych:  Calm, appropriate.   Skin:  Warm, dry, no rash.    ED Results / Procedures / Treatments   Labs (all labs ordered are listed, but only abnormal results are displayed) Labs Reviewed  COMPREHENSIVE METABOLIC PANEL - Abnormal; Notable for the following components:      Result Value   Sodium 132 (*)    Glucose, Bld 106 (*)    Calcium 8.5 (*)    Albumin 3.3 (*)    All other components within normal limits  CBC WITH DIFFERENTIAL/PLATELET - Abnormal; Notable for the following components:   WBC 16.9 (*)    RBC 4.06 (*)    Hemoglobin 12.5 (*)    HCT 36.7 (*)    Platelets 149 (*)    Neutro Abs 16.2 (*)    Lymphs Abs 0.4 (*)    Abs  Immature Granulocytes 0.09 (*)    All other components within normal limits  URINALYSIS, W/ REFLEX TO CULTURE (INFECTION SUSPECTED) - Abnormal; Notable for the following components:   Color, Urine YELLOW (*)    APPearance HAZY (*)    Hgb urine dipstick LARGE (*)    Leukocytes,Ua MODERATE (*)    Bacteria, UA RARE (*)    All other components within normal limits  RESP PANEL BY RT-PCR (RSV, FLU A&B, COVID)  RVPGX2  CULTURE, BLOOD (ROUTINE X 2)  CULTURE, BLOOD (ROUTINE X 2)  URINE CULTURE  LACTIC ACID, PLASMA  PROTIME-INR  APTT  LACTIC ACID, PLASMA  PROCALCITONIN  BASIC METABOLIC PANEL  CBC     EKG My EKG interpretation: Rate of 100, sinus tachycardia, left axis deviation.  No acute ST elevations or depressions   RADIOLOGY Independently interpreted chest x-ray with no acute pathology   PROCEDURES:  Critical Care performed: Yes, see critical care procedure note(s)  .Critical Care  Performed by: Janith Lima, MD Authorized by: Janith Lima, MD    Critical care provider statement:    Critical care time (minutes):  30   Critical care was necessary to treat or prevent imminent or life-threatening deterioration of the following conditions:  Sepsis   Critical care was time spent personally by me on the following activities:  Development of treatment plan with patient or surrogate, discussions with consultants, evaluation of patient's response to treatment, examination of patient, ordering and review of laboratory studies, ordering and review of radiographic studies, ordering and performing treatments and interventions, pulse oximetry, re-evaluation of patient's condition and review of old charts   I assumed direction of critical care for this patient from another provider in my specialty: no     Care discussed with: admitting provider      MEDICATIONS ORDERED IN ED: Medications  sodium chloride flush (NS) 0.9 % injection 10 mL (has no administration in time range)  vancomycin (VANCOCIN) IVPB 1000 mg/200 mL premix (1,000 mg Intravenous New Bag/Given 09/03/23 1726)  lactated ringers infusion (has no administration in time range)  acetaminophen (TYLENOL) tablet 650 mg (has no administration in time range)    Or  acetaminophen (TYLENOL) suppository 650 mg (has no administration in time range)  ondansetron (ZOFRAN) tablet 4 mg (has no administration in time range)    Or  ondansetron (ZOFRAN) injection 4 mg (has no administration in time range)  heparin injection 5,000 Units (has no administration in time range)  senna-docusate (Senokot-S) tablet 1 tablet (has no administration in time range)  lactated ringers bolus 1,000 mL (1,000 mLs Intravenous New Bag/Given 09/03/23 1626)  ceFEPIme (MAXIPIME) 2 g in sodium chloride 0.9 % 100 mL IVPB (0 g Intravenous Stopped 09/03/23 1721)  metroNIDAZOLE (FLAGYL) IVPB 500 mg (500 mg Intravenous New Bag/Given 09/03/23 1659)     IMPRESSION / MDM / ASSESSMENT AND PLAN / ED COURSE  I reviewed the triage  vital signs and the nursing notes.                              Differential diagnosis includes, but is not limited to, UTI, pneumonia, COPD exacerbation, sepsis, lower suspicion for intra-abdominal infection  Patient's presentation is most consistent with acute presentation with potential threat to life or bodily function.  Patient is a 77 year old male presenting today as a sepsis alert for weakness with fever and tachycardia.  Did have recent procedure on his prostate although this was  performed rectally rather than transurethral.  Will evaluate for infectious sources with chest x-ray, viral swab, UA initially as well as additional lab workup.  Will obtain blood cultures, lactic, and start broad-spectrum antibiotics while completing workup.  Laboratory workup notable for UTI with leukocytosis likely indicative of his source of sepsis.  The rest of his workup was largely reassuring.  Patient started on antibiotics and will admit to hospitalist for ongoing care.  The patient is on the cardiac monitor to evaluate for evidence of arrhythmia and/or significant heart rate changes. Clinical Course as of 09/03/23 1800  Wed Sep 03, 2023  1622 Pulse Rate(!): 111 [DW]  1642 WBC(!): 16.9 [DW]  1708 Urinalysis, w/ Reflex to Culture (Infection Suspected) -Urine, Clean Catch(!) Does appear positive for UTI which is his most likely source of sepsis [DW]    Clinical Course User Index [DW] Janith Lima, MD     FINAL CLINICAL IMPRESSION(S) / ED DIAGNOSES   Final diagnoses:  Sepsis, due to unspecified organism, unspecified whether acute organ dysfunction present [facility])  Acute cystitis with hematuria     Rx / DC Orders   ED Discharge Orders     None        Note:  This document was prepared using Dragon voice recognition software and may include unintentional dictation errors.   Janith Lima, MD 09/03/23 [phone]

## 2023-09-03 NOTE — Assessment & Plan Note (Signed)
-   CPAP nightly ordered 

## 2023-09-03 NOTE — Hospital Course (Addendum)
Mr. tyton rottier is a 77 year old male with history of hypertension, prostate cancer, hyperlipidemia, Alzheimer dementia, history of CVA with no residual deficits, COPD, chronic respiratory failure oxygen supplementation with 3 L nasal cannula chronically, who presents to the emergency department for chief concerns of altered mental status, and decreased appetite for 7 days.  Vitals in the ED showed temperature of 98.7, respiration rate of 17, heart rate of 102, blood pressure 125/66, SpO2 of 99% on 3 L nasal cannula.  Per EDP, EMS reported with the EDP that patient's temperature with a cold compress on was 100.3.  Patient was given IV fluid by EMS, and no antipyretic medications were given.  Serum sodium is 132, potassium 3.9, chloride 98, bicarb 28, BUN of 20, serum creatinine 1.18, EGFR greater than 60, nonfasting blood glucose 106, WBC 16.9, hemoglobin 12.5, platelets of 149.  Lactic acid is 1.8.  UA showed moderate leukocytes and large hemoglobin.  ED treatment: Cefepime 2 g IV one-time dose, metronidazole 500 mg IV, vancomycin IV, LR 1 L bolus.

## 2023-09-03 NOTE — Assessment & Plan Note (Signed)
On baseline 3 L nasal cannula.

## 2023-09-03 NOTE — Assessment & Plan Note (Addendum)
with one side that spouse states was weaker, she states the EMS said the side but she forgot Per spouse, the expressive aphasia was concerning to her and this was new At bedside, with my evaluation patient did not have expressive aphasia Differentials include stroke versus TIA CT of the head without contrast has been ordered

## 2023-09-03 NOTE — Consult Note (Signed)
Pharmacy Antibiotic Note  Bradley Bowers is a 77 y.o. male admitted on 09/03/2023 with sepsis. PMH is significant for COPD on 3L chronically, CVA with no residual deficits, HTN, and prostate cancer. Pharmacy has been consulted for vancomycin and cefepime dosing.  Today, 09/03/2023  Day 1 of antibiotics  WBC 16.9 Lactate 1.8 Afebrile  Scr 1.18 with estimated CrCl 54.7 mL/min  Blood and urine cultures have been collected  Received one time doses of vancomycin 1 gm, cefepime 2 gm, and metronidazole 500 mg in ED  Plan Give an additional vancomycin 750 mg IV x 1 to complete a full loading dose of 1750 mg  Start vancomycin 1250 mg IV Q24H  Goal AUC 400-550 Estimated AUC 465.7 Estimated Cmin 11.5  Scr 1.18, IBW, Vd 0.72  Start cefepime 2 gm IV Q12H based on current renal function  Pharmacy will continue to monitor and dose adjust appropriately   Height: 5\' 6"  (167.6 cm) Weight: 83 kg (183 lb) IBW/kg (Calculated) : 63.8  Temp (24hrs), Avg:98.7 F (37.1 C), Min:98.7 F (37.1 C), Max:98.7 F (37.1 C)  Recent Labs  Lab 09/03/23 1619  WBC 16.9*  CREATININE 1.18  LATICACIDVEN 1.8    Estimated Creatinine Clearance: 54.7 mL/min (by C-G formula based on SCr of 1.18 mg/dL).    No Known Allergies  Antimicrobials this admission: Metronidazole 11/27 x 1  Cefepime 11/27 >> x 7 days Vancomycin 11/27 >> x 7 days   Dose adjustments this admission:  Microbiology results: 11/27 BCx: sent 11/27 UCx: sent   Thank you for allowing pharmacy to be a part of this patient's care.  Littie Deeds, PharmD Pharmacy Resident  09/03/2023 6:03 PM

## 2023-09-03 NOTE — Assessment & Plan Note (Signed)
Etiology workup in progress, suspect secondary to UTI

## 2023-09-03 NOTE — Assessment & Plan Note (Signed)
Hydralazine 5 mg IV every 6 hours as needed for SBP > 170, 5 days ordered

## 2023-09-03 NOTE — Sepsis Progress Note (Signed)
Elink will follow per sepsis protocol

## 2023-09-03 NOTE — Assessment & Plan Note (Signed)
Patient had elevated heart rate, respiration rate, leukocytosis of 16.9, organ involvement is encephalopathy Continue with cefepime and vancomycin per pharmacy Check MRSA PCR Patient is maintaining appropriate MAP, goal MAP will be greater than 65 Admit to PCU, inpatient

## 2023-09-03 NOTE — H&P (Signed)
History and Physical   Bradley Bowers SWF:[id number] DOB: 11-12-1945 DOA: 09/03/2023  PCP: Danella Penton, MD  Outpatient Specialists: Dr. Lonna Cobb, urology Patient coming from: Home via EMS  I have personally briefly reviewed patient's old medical records in [facility] EMR.  Chief Concern: Lethargy, lack of appetite  HPI: [name] is a 77 year old male with history of hypertension, prostate cancer, hyperlipidemia, Alzheimer dementia, history of CVA with no residual deficits, COPD, chronic respiratory failure oxygen supplementation with 3 L nasal cannula chronically, who presents to the emergency department for chief concerns of altered mental status, and decreased appetite for 7 days.  Vitals in the ED showed temperature of 98.7, respiration rate of 17, heart rate of 102, blood pressure 125/66, SpO2 of 99% on 3 L nasal cannula.  Per EDP, EMS reported with the EDP that patient's temperature with a cold compress on was 100.3.  Patient was given IV fluid by EMS, and no antipyretic medications were given.  Serum sodium is 132, potassium 3.9, chloride 98, bicarb 28, BUN of 20, serum creatinine 1.18, EGFR greater than 60, nonfasting blood glucose 106, WBC 16.9, hemoglobin 12.5, platelets of 149.  Lactic acid is 1.8.  UA showed moderate leukocytes and large hemoglobin.  ED treatment: Cefepime 2 g IV one-time dose, metronidazole 500 mg IV, vancomycin IV, LR 1 L bolus. ------------------------------------ At bedside, patient was able to tell me his name, age, location, current calendar year.  Patient states that he has been feeling very fatigued, weak, having difficulty getting out of bed over the last 2 days.  He reports that he has not been eating very well over the last 2 days.  He denies dysuria, hematuria, diarrhea, chest pain, shortness of breath, cough, nausea, vomiting.  He reports that his wife thought that he had a urinary tract infection.  I spoke with spouse over  the phone, patient states that he has had balance issue since his last stroke. If he leans too much forward, he will fall forward, especially trying to pick up anything. If he leans back, he will fall backwards.   Per spouse, patient had markers placed in the prostate before he can get radiation treatment. This was done on Monday.   Social history: He lives at home with his wife. He does not use tobacco, etoh, recreational drug use. He is retired Statistician.   ROS: Constitutional: no weight change, no fever ENT/Mouth: no sore throat, no rhinorrhea Eyes: no eye pain, no vision changes Cardiovascular: no chest pain, no dyspnea,  no edema, no palpitations Respiratory: no cough, no sputum, no wheezing Gastrointestinal: no nausea, no vomiting, no diarrhea, no constipation Genitourinary: no urinary incontinence, no dysuria, no hematuria Musculoskeletal: no arthralgias, no myalgias Skin: no skin lesions, no pruritus, Neuro: + weakness, no loss of consciousness, no syncope Psych: no anxiety, no depression, + decrease appetite Heme/Lymph: no bruising, no bleeding  ED Course: Discussed with EDP, patient requiring hospitalization for chief concerns of meeting sepsis criteria.  Assessment/Plan  Principal Problem:   Severe sepsis with acute organ dysfunction (HCC) Active Problems:   Urinary tract infection in male   Generalized weakness   Chronic obstructive pulmonary disease (COPD) (HCC)   Gastro-esophageal reflux disease without esophagitis   Obstructive apnea   Restless leg   Bipolar affective disorder, current episode manic with psychotic symptoms (HCC)   Hyperlipemia   Essential hypertension   Peripheral neuropathy   CVA (cerebral vascular accident) (HCC)   Parkinsonian features   Acute  metabolic encephalopathy   History of TIA (transient ischemic attack)   Chronic respiratory failure with hypoxia (HCC)   Expressive aphasia   Assessment and Plan:  * Severe sepsis with  acute organ dysfunction [facility]) Patient had elevated heart rate, respiration rate, leukocytosis of 16.9, organ involvement is encephalopathy Continue with cefepime and vancomycin per pharmacy Check MRSA PCR Patient is maintaining appropriate MAP, goal MAP will be greater than 65 Admit to PCU, inpatient  Urinary tract infection in male Present on admission Patient recently had urologic procedure on Monday, 09/01/2023, markers placed prior to radiation initiation for prostate cancer  Generalized weakness Fall precautions, aspiration precaution  Expressive aphasia with one side that spouse states was weaker, she states the EMS said the side but she forgot Per spouse, the expressive aphasia was concerning to her and this was new At bedside, with my evaluation patient did not have expressive aphasia Differentials include stroke versus TIA CT of the head without contrast has been ordered  Chronic respiratory failure with hypoxia (HCC) On baseline 3 L nasal cannula  Acute metabolic encephalopathy Etiology workup in progress, suspect secondary to UTI  Essential hypertension Hydralazine 5 mg IV every 6 hours as needed for SBP > 170, 5 days ordered  Obstructive apnea CPAP nightly ordered  Chart reviewed.   DVT prophylaxis: Heparin 5000 units subcutaneous every 8 hours Code Status: Full code Diet: Heart healthy Family Communication: called spouse, Karym Janes and updated spouse.  Time was given for questions and discussion regarding spouse concern of patient's symptoms Disposition Plan: Pending clinical course Consults called: Pharmacy Admission status: PCU, inpatient  Past Medical History:  Diagnosis Date   Arterial vascular disease 10/24/2014   Overview:  MI 1994, small-vessel disease requiring coumadin    Asthma    Centrilobular emphysema (HCC) 10/24/2014   Overview:  2L O2 prn    Centrilobular emphysema (HCC)    Chronic obstructive pulmonary disease (HCC) 11/06/2015    Coronary artery disease    GERD (gastroesophageal reflux disease)    Glaucoma    Heart attack (HCC) 2/17, 1994   Heart disease    Hyperlipemia    Hypertension    Restless leg 10/24/2014   Sleep apnea    Past Surgical History:  Procedure Laterality Date   CATARACT EXTRACTION W/ INTRAOCULAR LENS  IMPLANT, BILATERAL     COLONOSCOPY WITH PROPOFOL N/A 02/19/2018   Procedure: COLONOSCOPY WITH PROPOFOL;  Surgeon: Christena Deem, MD;  Location: [facility] ENDOSCOPY;  Service: Endoscopy;  Laterality: N/A;   ESOPHAGOGASTRODUODENOSCOPY (EGD) WITH PROPOFOL N/A 02/19/2018   Procedure: ESOPHAGOGASTRODUODENOSCOPY (EGD) WITH PROPOFOL;  Surgeon: Christena Deem, MD;  Location: [facility] ENDOSCOPY;  Service: Endoscopy;  Laterality: N/A;   EYE SURGERY     Social History:  reports that he quit smoking about 29 years ago. His smoking use included cigarettes. He started smoking about 59 years ago. He has a 30 pack-year smoking history. He has never used smokeless tobacco. He reports that he does not drink alcohol and does not use drugs.  No Known Allergies Family History  Problem Relation Age of Onset   Cancer Mother    Nephrolithiasis Father    Prostate cancer Neg Hx    Bladder Cancer Neg Hx    Kidney cancer Neg Hx    Family history: Family history reviewed and not pertinent.  Prior to Admission medications   Medication Sig Start Date End Date Taking? Authorizing Provider  albuterol (VENTOLIN HFA) 108 (90 Base) MCG/ACT inhaler Inhale 1-2 puffs into  the lungs every 4 (four) hours as needed.    [provider]  aspirin 81 MG EC tablet Take 81 mg by mouth daily. 11/14/15   [provider]  atorvastatin (LIPITOR) 80 MG tablet Take 80 mg by mouth daily.    [provider]  carbamazepine (TEGRETOL) 200 MG tablet Take 1 tablet by mouth 2 (two) times daily. 01/12/21 09/24/22  [provider]  cholecalciferol (VITAMIN D3) 25 MCG (1000 UNIT) tablet Take 1,000 Units by mouth daily.     [provider]  clopidogrel (PLAVIX) 75 MG tablet Take 75 mg by mouth daily.    [provider]  cyanocobalamin (VITAMIN B12) 1000 MCG tablet Take 1,000 mcg by mouth daily.    [provider]  diltiazem (CARDIZEM CD) 120 MG 24 hr capsule Take 120 mg by mouth daily.    [provider]  divalproex (DEPAKOTE ER) 500 MG 24 hr tablet Take 500 mg by mouth daily. 09/12/22   [provider]  donepezil (ARICEPT) 10 MG tablet Take 1 tablet by mouth at bedtime. 07/11/22 07/11/23  [provider]  feeding supplement (ENSURE ENLIVE / ENSURE PLUS) LIQD Take 237 mLs by mouth 2 (two) times daily between meals. 10/05/22   Kathlen Mody, MD  fluticasone (FLONASE) 50 MCG/ACT nasal spray Place 1 spray into both nostrils daily.    [provider]  mometasone-formoterol (DULERA) 100-5 MCG/ACT AERO Inhale 2 puffs into the lungs 2 (two) times daily. 10/04/22   Kathlen Mody, MD  montelukast (SINGULAIR) 10 MG tablet Take 10 mg by mouth at bedtime.    [provider]  pantoprazole (PROTONIX) 40 MG tablet Take 40 mg by mouth 2 (two) times daily. 07/11/22   [provider]  polyethylene glycol (MIRALAX / GLYCOLAX) 17 g packet Take 17 g by mouth daily as needed. 10/04/22   Kathlen Mody, MD  spironolactone (ALDACTONE) 25 MG tablet Take 1 tablet by mouth daily. 07/11/22   [provider]  timolol (TIMOPTIC) 0.5 % ophthalmic solution 1 drop 2 (two) times daily.    [provider]  tiotropium (SPIRIVA) 18 MCG inhalation capsule Place 18 mcg into inhaler and inhale daily.    [provider]  TRAVATAN Z 0.004 % SOLN ophthalmic solution Place 1 drop into both eyes at bedtime. 09/16/22   [provider]   Physical Exam: Vitals:   09/03/23 1630 09/03/23 1700 09/03/23 1730 09/03/23 1830  BP: 139/62 125/66 130/64 117/65  Pulse: 99 96 (!) 105 96  Resp: 18 17 (!) 22 16  Temp:      TempSrc:      SpO2: 99% 99% 99% 99%   Weight:      Height:       Constitutional: appears age-appropriate, frail, NAD, calm Eyes: PERRL, lids and conjunctivae normal ENMT: Mucous membranes are moist. Posterior pharynx clear of any exudate or lesions. Age-appropriate dentition. Hearing appropriate Neck: normal, supple, no masses, no thyromegaly Respiratory: clear to auscultation bilaterally, no wheezing, no crackles. Normal respiratory effort. No accessory muscle use.  Cardiovascular: Regular rate and rhythm, no murmurs / rubs / gallops. No extremity edema. 2+ pedal pulses. No carotid bruits.  Abdomen: Obese abdomen, no tenderness, no masses palpated, no hepatosplenomegaly. Bowel sounds positive.  Musculoskeletal: no clubbing / cyanosis. No joint deformity upper and lower extremities. Good ROM, no contractures, no atrophy. Normal muscle tone.  Skin: no rashes, lesions, ulcers. No induration.  Feet are dry and flaky Neurologic: Sensation intact. Strength 5/5 in all  4.  Psychiatric: Normal judgment and insight. Alert and oriented x 3. Normal mood.  Flat affect  EKG: independently reviewed, showing sinus tachycardia with rate of 100, QTc 431  Chest x-ray on Admission: I personally reviewed and I agree with radiologist reading as below.  DG Chest Port 1 View  Result Date: 09/03/2023 CLINICAL DATA:  Questionable sepsis - evaluate for abnormality EXAM: PORTABLE CHEST 1 VIEW COMPARISON:  CXR 10/04/22 FINDINGS: Pleural effusion. No pneumothorax. No focal airspace opacity. Normal cardiac and mediastinal contours. No radiographically apparent displaced rib fractures. Visualized upper abdomen unremarkable. IMPRESSION: No focal airspace opacity Electronically Signed   By: Lorenza Cambridge M.D.   On: 09/03/2023 16:50    Labs on Admission: I have personally reviewed following labs  CBC: Recent Labs  Lab 09/03/23 1619  WBC 16.9*  NEUTROABS 16.2*  HGB 12.5*  HCT 36.7*  MCV 90.4  PLT 149*   Basic Metabolic Panel: Recent Labs  Lab  09/03/23 1619  NA 132*  K 3.9  CL 98  CO2 28  GLUCOSE 106*  BUN 20  CREATININE 1.18  CALCIUM 8.5*   GFR: Estimated Creatinine Clearance: 54.7 mL/min (by C-G formula based on SCr of 1.18 mg/dL).  Liver Function Tests: Recent Labs  Lab 09/03/23 1619  AST 20  ALT 18  ALKPHOS 61  BILITOT 0.9  PROT 6.9  ALBUMIN 3.3*   Coagulation Profile: Recent Labs  Lab 09/03/23 1619  INR 1.1   Urine analysis:    Component Value Date/Time   COLORURINE YELLOW (A) 09/03/2023 1620   APPEARANCEUR HAZY (A) 09/03/2023 1620   APPEARANCEUR Clear 02/20/2016 1102   LABSPEC 1.013 09/03/2023 1620   PHURINE 7.0 09/03/2023 1620   GLUCOSEU NEGATIVE 09/03/2023 1620   HGBUR LARGE (A) 09/03/2023 1620   BILIRUBINUR NEGATIVE 09/03/2023 1620   BILIRUBINUR Negative 02/20/2016 1102   KETONESUR NEGATIVE 09/03/2023 1620   PROTEINUR NEGATIVE 09/03/2023 1620   NITRITE NEGATIVE 09/03/2023 1620   LEUKOCYTESUR MODERATE (A) 09/03/2023 1620   This document was prepared using Dragon Voice Recognition software and may include unintentional dictation errors.  Dr. Sedalia Muta [facility]  If 7PM-7AM, please contact overnight-coverage provider If 7AM-7PM, please contact day attending provider [email or web address]  09/03/2023, 7:34 PM

## 2023-09-03 NOTE — Assessment & Plan Note (Addendum)
Present on admission Patient recently had urologic procedure on Monday, 09/01/2023, markers placed prior to radiation initiation for prostate cancer

## 2023-09-03 NOTE — Progress Notes (Addendum)
CODE SEPSIS - PHARMACY COMMUNICATION  **Broad Spectrum Antibiotics should be administered within 1 hour of Sepsis diagnosis**  Time Code Sepsis Called/Page Received: 1600  Antibiotics Ordered: Cefepime, Vancomycin, Flagyl   Time of 1st antibiotic administration: 1627  Additional action taken by pharmacy: N/A  If necessary, Name of Provider/Nurse Contacted: N/A  Littie Deeds, PharmD Pharmacy Resident  09/03/2023 4:19 PM

## 2023-09-03 NOTE — Consult Note (Signed)
ED Pharmacy Antibiotic Sign Off An antibiotic consult was received from an ED provider for cefepime & vancomycin per pharmacy dosing. A chart review was completed to assess appropriateness.   The following one time order(s) were placed: Vancomycin 1000 mg IV x 1 and cefepime 2 gm IV x 1   Further antibiotic and/or antibiotic pharmacy consults should be ordered by the admitting provider if indicated.   Thank you for allowing pharmacy to be a part of this patient's care.   Littie Deeds, PharmD Pharmacy Resident  09/03/2023 4:21 PM

## 2023-09-03 NOTE — ED Triage Notes (Addendum)
Patient arrives to ED via [facility] for code sepsis. Patient's wife called EMS due to patient acting lethargic and having a decreased appetite for the past day. Patient went earlier this week to have markers placed for prostate cancer. Patient has Hx of lung cancer and COPD. Patient wears 3L DISH baseline at home and continues to have normal O2 sats with 3L  today. Patient also has Hx of stroke. Patient answering questions appropriately during triage at this time. EMS called Code Sepsis due to patient having a temp of 103.1, HR of 105, and RR 16-24. Patient states the last time he had something to eat or drink was yesterday.

## 2023-09-03 NOTE — Assessment & Plan Note (Signed)
Fall precautions, aspiration precaution.

## 2023-09-04 ENCOUNTER — Other Ambulatory Visit: Payer: Self-pay

## 2023-09-04 ENCOUNTER — Encounter: Payer: Self-pay | Admitting: Internal Medicine

## 2023-09-04 DIAGNOSIS — R6520 Severe sepsis without septic shock: Secondary | ICD-10-CM

## 2023-09-04 DIAGNOSIS — A419 Sepsis, unspecified organism: Secondary | ICD-10-CM

## 2023-09-04 LAB — CBC
HCT: 35.7 % — ABNORMAL LOW (ref 39.0–52.0)
Hemoglobin: 12 g/dL — ABNORMAL LOW (ref 13.0–17.0)
MCH: 30.5 pg (ref 26.0–34.0)
MCHC: 33.6 g/dL (ref 30.0–36.0)
MCV: 90.6 fL (ref 80.0–100.0)
Platelets: 155 10*3/uL (ref 150–400)
RBC: 3.94 MIL/uL — ABNORMAL LOW (ref 4.22–5.81)
RDW: 11.9 % (ref 11.5–15.5)
WBC: 22.4 10*3/uL — ABNORMAL HIGH (ref 4.0–10.5)
nRBC: 0 % (ref 0.0–0.2)

## 2023-09-04 LAB — BASIC METABOLIC PANEL
Anion gap: 10 (ref 5–15)
BUN: 18 mg/dL (ref 8–23)
CO2: 22 mmol/L (ref 22–32)
Calcium: 7.8 mg/dL — ABNORMAL LOW (ref 8.9–10.3)
Chloride: 101 mmol/L (ref 98–111)
Creatinine, Ser: 1.08 mg/dL (ref 0.61–1.24)
GFR, Estimated: >60 mL/min (ref >60)
Glucose, Bld: 97 mg/dL (ref 70–99)
Potassium: 4.1 mmol/L (ref 3.5–5.1)
Sodium: 133 mmol/L — ABNORMAL LOW (ref 135–145)

## 2023-09-04 LAB — MRSA NEXT GEN BY PCR, NASAL: MRSA by PCR Next Gen: DETECTED — AB

## 2023-09-04 MED ORDER — MOMETASONE FURO-FORMOTEROL FUM 100-5 MCG/ACT IN AERO
2.0000 | INHALATION_SPRAY | Freq: Two times a day (BID) | RESPIRATORY_TRACT | Status: DC
  Administered 2023-09-05 – 2023-09-07 (×6): 2 via RESPIRATORY_TRACT
  Filled 2023-09-04: qty 8.8

## 2023-09-04 MED ORDER — TIOTROPIUM BROMIDE MONOHYDRATE 18 MCG IN CAPS
18.0000 ug | ORAL_CAPSULE | Freq: Every day | RESPIRATORY_TRACT | Status: DC
Start: 2023-09-05 — End: 2023-09-07
  Administered 2023-09-05 – 2023-09-07 (×3): 18 ug via RESPIRATORY_TRACT
  Filled 2023-09-04: qty 5

## 2023-09-04 MED ORDER — IPRATROPIUM-ALBUTEROL 0.5-2.5 (3) MG/3ML IN SOLN
3.0000 mL | RESPIRATORY_TRACT | Status: DC | PRN
  Administered 2023-09-04: 3 mL via RESPIRATORY_TRACT

## 2023-09-04 NOTE — ED Notes (Signed)
Advised nurse that patient has ready bed 

## 2023-09-04 NOTE — Progress Notes (Signed)
       CROSS COVER NOTE  NAME: Bradley Bowers MRN: [id number] DOB : 1946/06/23    Concern as stated by nurse / staff   Message received from RN via secure chat Pt has SHOB. Wants a inhaler order or neb treatment order      Pertinent findings on chart review: Review of chart pateint was admitted 11/27 for severe sepsis with urinary source. Recently underwent urologic procedure 11/25 for placement markers for radiation therapy for prostate CA. Outpatient char review in epic confirms patient with chronic resp failure with hypoxia and hypercapnia as well as COPD.   Assessment and  Interventions   Assessment:    09/04/2023   10:31 PM 09/04/2023    1:11 PM 09/04/2023   12:40 PM  Vitals with BMI  Systolic 155 143 376  Diastolic 70 74 63  Pulse 91 90 93   Home meds reviewed Plan: Place patient on CPAP nightly ase previously ordered Resume home dulera, spiriva and added duoneb for acute symptoms   Donnie Mesa NP [facility] Cross Cover 7pm-7am - check amion for availability Pager [phone]

## 2023-09-04 NOTE — Progress Notes (Addendum)
[facility]  - [place] [facility]   PATIENT NAME: Bradley Bowers    MR#:  [id number]  DATE OF BIRTH:  Jan 03, 1946  SUBJECTIVE:  no family at bedside. Patient just got to the room from the ER. Had fever of 103.5. Received some Tylenol and ice packs. Patient is alert and oriented. Denies any abdominal pain cough burning urine nausea vomiting. Patient received IV fluids in the ER encouraged to keep oral hydration    VITALS:  Blood pressure (!) 143/74, pulse 90, temperature (!) 103.3 F (39.6 C), temperature source Oral, resp. rate (!) 21, height 5\' 6"  (1.676 m), weight 83 kg, SpO2 95%.  PHYSICAL EXAMINATION:   GENERAL:  77 y.o.-year-old patient with no acute distress. Obese LUNGS: Normal breath sounds bilaterally, no wheezing CARDIOVASCULAR: S1, S2 normal. No murmur   ABDOMEN: Soft, nontender, nondistended. Bowel sounds present.  EXTREMITIES: No  edema b/l.    NEUROLOGIC: nonfocal  patient is alert and awake   LABORATORY PANEL:  CBC Recent Labs  Lab 09/04/23 0443  WBC 22.4*  HGB 12.0*  HCT 35.7*  PLT 155    Chemistries  Recent Labs  Lab 09/03/23 1619 09/04/23 0443  NA 132* 133*  K 3.9 4.1  CL 98 101  CO2 28 22  GLUCOSE 106* 97  BUN 20 18  CREATININE 1.18 1.08  CALCIUM 8.5* 7.8*  AST 20  --   ALT 18  --   ALKPHOS 61  --   BILITOT 0.9  --    Cardiac Enzymes No results for input(s): "TROPONINI" in the last 168 hours. RADIOLOGY:  CT HEAD WO CONTRAST ( )  Result Date: 09/03/2023 CLINICAL DATA:  Initial evaluation for acute mental status change. EXAM: CT HEAD WITHOUT CONTRAST TECHNIQUE: Contiguous axial images were obtained from the base of the skull through the vertex without intravenous contrast. RADIATION DOSE REDUCTION: This exam was performed according to the departmental dose-optimization program which includes automated exposure control, adjustment of the mA and/or kV according to patient size and/or use of iterative reconstruction  technique. COMPARISON:  CT from 09/23/2022. FINDINGS: Brain: Cerebral volume within normal limits for age. No mild chronic microvascular ischemic disease. Remote lacunar infarct noted at the right thalamocapsular region. No acute intracranial hemorrhage. No acute cortically based infarct. No mass lesion, midline shift or mass effect. Ventricles stable in size without hydrocephalus. No extra-axial fluid collection. Vascular: No abnormal hyperdense vessel. Scattered vascular calcifications noted within the carotid siphons. Skull: Scalp soft tissues within normal limits.  Calvarium intact. Sinuses/Orbits: Visualized globes and orbital soft tissues within normal limits. Partially visualized paranasal sinuses are largely clear. No mastoid effusion. Other: None. IMPRESSION: 1. No acute intracranial abnormality. 2. Mild chronic microvascular ischemic disease with remote lacunar infarct at the right thalamocapsular region. Electronically Signed   By: Rise Mu M.D.   On: 09/03/2023 20:29   DG Chest Port 1 View  Result Date: 09/03/2023 CLINICAL DATA:  Questionable sepsis - evaluate for abnormality EXAM: PORTABLE CHEST 1 VIEW COMPARISON:  CXR 10/04/22 FINDINGS: Pleural effusion. No pneumothorax. No focal airspace opacity. Normal cardiac and mediastinal contours. No radiographically apparent displaced rib fractures. Visualized upper abdomen unremarkable. IMPRESSION: No focal airspace opacity Electronically Signed   By: Lorenza Cambridge M.D.   On: 09/03/2023 16:50    Assessment and Plan Bradley Bowers is a 77 y.o. male with history of COPD on 3L chronically, CVA with no residual deficits, HTN, prostate cancer presenting today for altered mental status.  Family at home  called out to EMS due to patient being altered today and more weak than normal.  With EMS, he was noted to have a fever 103 F and was tachycardic but no hypotension   patient is status post placement of fiducial markers by Dr. Lonna Cobb on 25th  November in anticipation of his upcoming IMRT with Dr. Rushie Chestnut.   Severe sepsis with acute organ dysfunction [facility]) --Patient had elevated heart rate, respiration rate, leukocytosis of 16.9, organ involvement is encephalopathy --Continue with cefepime and vancomycin per pharmacy -- MRSA PCR positive -- received IV fluids. Blood pressure stable. -- Follow-up blood culture and urine culture --WBC 16K--22K -- consider CT scan of the abdomen of patient continues to spike fever   Urinary tract infection in male adenocarcinoma of the prostate status post placement of fiducial markers by Dr. Lonna Cobb on 25th November in anticipation of his upcoming IMRT  --Present on admission ---Patient recently had urologic procedure on Monday, 09/01/2023, markers placed prior to radiation initiation for prostate cancer -- patient did receive antibiotics prior to procedure -- continue broad-spectrum antibiotic with cefepime +vanc   Generalized weakness Fall precautions, aspiration precaution   Expressive aphasia -- CT head no acute abnormality -- speech appears clear   Chronic respiratory failure with hypoxia (HCC) On baseline 3 L nasal cannula   Acute metabolic encephalopathy Etiology workup in progress, suspect secondary to UTI   Essential hypertension Hydralazine 5 mg IV every 6 hours as needed for SBP > 170, 5 days ordered   Obstructive apnea CPAP nightly ordered     Procedures: Family communication : none today Consults : CODE STATUS: full  DVT Prophylaxis : heparin Level of care: Telemetry Medical Status is: Inpatient Remains inpatient appropriate because: sepsis    TOTAL TIME TAKING CARE OF THIS PATIENT: 50 minutes.  >50% time spent on counselling and coordination of care  Note: This dictation was prepared with Dragon dictation along with smaller phrase technology. Any transcriptional errors that result from this process are unintentional.  Enedina Finner M.D    [facility]   CC: Primary care physician; Danella Penton, MD

## 2023-09-04 NOTE — ED Notes (Signed)
In room for morning lab draw. Pt febrile and had urinated on self when attempting to use urinal. Pt cleansed of incontinence and soiled linens replaced with new clean linens (bottom sheet, chucks pad, blanket, and gown). Pt given pillow with pillowcase per request and hand-held fan provided as pt was feeling warm and was febrile at 102.5 orally - medicated with 650 Tylenol. Call bell in pts hand and denies any wants/needs at this time.

## 2023-09-05 DIAGNOSIS — A419 Sepsis, unspecified organism: Secondary | ICD-10-CM | POA: Diagnosis not present

## 2023-09-05 DIAGNOSIS — R531 Weakness: Secondary | ICD-10-CM

## 2023-09-05 DIAGNOSIS — J438 Other emphysema: Secondary | ICD-10-CM | POA: Diagnosis not present

## 2023-09-05 DIAGNOSIS — N390 Urinary tract infection, site not specified: Secondary | ICD-10-CM | POA: Diagnosis not present

## 2023-09-05 LAB — BASIC METABOLIC PANEL
Anion gap: 9 (ref 5–15)
BUN: 22 mg/dL (ref 8–23)
CO2: 26 mmol/L (ref 22–32)
Calcium: 8.3 mg/dL — ABNORMAL LOW (ref 8.9–10.3)
Chloride: 96 mmol/L — ABNORMAL LOW (ref 98–111)
Creatinine, Ser: 1.04 mg/dL (ref 0.61–1.24)
GFR, Estimated: >60 mL/min (ref >60)
Glucose, Bld: 90 mg/dL (ref 70–99)
Potassium: 4.1 mmol/L (ref 3.5–5.1)
Sodium: 131 mmol/L — ABNORMAL LOW (ref 135–145)

## 2023-09-05 LAB — CBC
HCT: 35.1 % — ABNORMAL LOW (ref 39.0–52.0)
Hemoglobin: 12.1 g/dL — ABNORMAL LOW (ref 13.0–17.0)
MCH: 30.2 pg (ref 26.0–34.0)
MCHC: 34.5 g/dL (ref 30.0–36.0)
MCV: 87.5 fL (ref 80.0–100.0)
Platelets: 145 10*3/uL — ABNORMAL LOW (ref 150–400)
RBC: 4.01 MIL/uL — ABNORMAL LOW (ref 4.22–5.81)
RDW: 11.9 % (ref 11.5–15.5)
WBC: 9.8 10*3/uL (ref 4.0–10.5)
nRBC: 0 % (ref 0.0–0.2)

## 2023-09-05 LAB — URINALYSIS, W/ REFLEX TO CULTURE (INFECTION SUSPECTED)
Bilirubin Urine: NEGATIVE
Glucose, UA: 150 mg/dL — AB
Ketones, ur: 20 mg/dL — AB
Nitrite: NEGATIVE
Protein, ur: 30 mg/dL — AB
RBC / HPF: >50 RBC/hpf (ref 0–5)
Specific Gravity, Urine: 1.023 (ref 1.005–1.030)
WBC, UA: >50 WBC/hpf (ref 0–5)
pH: 5 (ref 5.0–8.0)

## 2023-09-05 LAB — URINE CULTURE: Culture: <10000 — AB

## 2023-09-05 MED ORDER — SODIUM CHLORIDE 0.9 % IV SOLN
2.0000 g | INTRAVENOUS | Status: DC
  Administered 2023-09-05 – 2023-09-06 (×2): 2 g via INTRAVENOUS
  Filled 2023-09-05 (×2): qty 20

## 2023-09-05 MED ORDER — VITAMIN D 25 MCG (1000 UNIT) PO TABS
1000.0000 [IU] | ORAL_TABLET | Freq: Every day | ORAL | Status: DC
  Administered 2023-09-05 – 2023-09-07 (×3): 1000 [IU] via ORAL
  Filled 2023-09-05 (×3): qty 1

## 2023-09-05 MED ORDER — DONEPEZIL HCL 5 MG PO TABS
10.0000 mg | ORAL_TABLET | Freq: Every day | ORAL | Status: DC
  Administered 2023-09-05 – 2023-09-06 (×2): 10 mg via ORAL
  Filled 2023-09-05 (×2): qty 2

## 2023-09-05 MED ORDER — DILTIAZEM HCL ER COATED BEADS 120 MG PO CP24
120.0000 mg | ORAL_CAPSULE | Freq: Every day | ORAL | Status: DC
  Administered 2023-09-05 – 2023-09-07 (×3): 120 mg via ORAL
  Filled 2023-09-05 (×3): qty 1

## 2023-09-05 MED ORDER — LATANOPROST 0.005 % OP SOLN
1.0000 [drp] | Freq: Every day | OPHTHALMIC | Status: DC
  Administered 2023-09-05 – 2023-09-06 (×2): 1 [drp] via OPHTHALMIC
  Filled 2023-09-05: qty 2.5

## 2023-09-05 MED ORDER — MONTELUKAST SODIUM 10 MG PO TABS
10.0000 mg | ORAL_TABLET | Freq: Every day | ORAL | Status: DC
  Administered 2023-09-05 – 2023-09-06 (×2): 10 mg via ORAL
  Filled 2023-09-05 (×2): qty 1

## 2023-09-05 MED ORDER — ENSURE ENLIVE PO LIQD
237.0000 mL | Freq: Two times a day (BID) | ORAL | Status: DC
Start: 2023-09-05 — End: 2023-09-07
  Administered 2023-09-05 – 2023-09-07 (×6): 237 mL via ORAL

## 2023-09-05 MED ORDER — ALBUTEROL SULFATE (2.5 MG/3ML) 0.083% IN NEBU: 2.5000 mL | INHALATION_SOLUTION | RESPIRATORY_TRACT | Status: DC | PRN

## 2023-09-05 MED ORDER — CHLORHEXIDINE GLUCONATE CLOTH 2 % EX PADS
6.0000 | MEDICATED_PAD | Freq: Every day | CUTANEOUS | Status: DC
  Administered 2023-09-05 – 2023-09-07 (×3): 6 via TOPICAL

## 2023-09-05 MED ORDER — ASPIRIN 81 MG PO TBEC
81.0000 mg | DELAYED_RELEASE_TABLET | Freq: Every day | ORAL | Status: DC
  Administered 2023-09-05 – 2023-09-07 (×3): 81 mg via ORAL
  Filled 2023-09-05 (×3): qty 1

## 2023-09-05 MED ORDER — VITAMIN B-12 1000 MCG PO TABS
1000.0000 ug | ORAL_TABLET | Freq: Every day | ORAL | Status: DC
Start: 2023-09-05 — End: 2023-09-07
  Administered 2023-09-05 – 2023-09-07 (×3): 1000 ug via ORAL
  Filled 2023-09-05 (×3): qty 1

## 2023-09-05 MED ORDER — PANTOPRAZOLE SODIUM 40 MG PO TBEC
40.0000 mg | DELAYED_RELEASE_TABLET | Freq: Two times a day (BID) | ORAL | Status: DC
  Administered 2023-09-05 – 2023-09-07 (×5): 40 mg via ORAL
  Filled 2023-09-05 (×5): qty 1

## 2023-09-05 MED ORDER — TIMOLOL MALEATE 0.5 % OP SOLN
1.0000 [drp] | Freq: Two times a day (BID) | OPHTHALMIC | Status: DC
  Administered 2023-09-05 – 2023-09-07 (×5): 1 [drp] via OPHTHALMIC
  Filled 2023-09-05: qty 5

## 2023-09-05 MED ORDER — ATORVASTATIN CALCIUM 20 MG PO TABS
80.0000 mg | ORAL_TABLET | Freq: Every day | ORAL | Status: DC
  Administered 2023-09-05 – 2023-09-07 (×3): 80 mg via ORAL
  Filled 2023-09-05 (×3): qty 4

## 2023-09-05 MED ORDER — SPIRONOLACTONE 25 MG PO TABS
25.0000 mg | ORAL_TABLET | Freq: Every day | ORAL | Status: DC
  Administered 2023-09-05 – 2023-09-07 (×3): 25 mg via ORAL
  Filled 2023-09-05 (×3): qty 1

## 2023-09-05 MED ORDER — CARBAMAZEPINE 200 MG PO TABS
200.0000 mg | ORAL_TABLET | Freq: Two times a day (BID) | ORAL | Status: DC
  Administered 2023-09-05 – 2023-09-07 (×5): 200 mg via ORAL
  Filled 2023-09-05 (×5): qty 1

## 2023-09-05 MED ORDER — POLYETHYLENE GLYCOL 3350 17 G PO PACK: 17.0000 g | PACK | Freq: Every day | ORAL | Status: DC | PRN

## 2023-09-05 MED ORDER — DIVALPROEX SODIUM 250 MG PO DR TAB
250.0000 mg | DELAYED_RELEASE_TABLET | Freq: Every day | ORAL | Status: DC
  Administered 2023-09-05 – 2023-09-07 (×3): 250 mg via ORAL
  Filled 2023-09-05 (×3): qty 1

## 2023-09-05 MED ORDER — MUPIROCIN 2 % EX OINT
1.0000 | TOPICAL_OINTMENT | Freq: Two times a day (BID) | CUTANEOUS | Status: DC
  Administered 2023-09-05 – 2023-09-07 (×5): 1 via NASAL
  Filled 2023-09-05: qty 22

## 2023-09-05 MED ORDER — FLUTICASONE PROPIONATE 50 MCG/ACT NA SUSP
1.0000 | Freq: Every day | NASAL | Status: DC
  Administered 2023-09-05 – 2023-09-07 (×3): 1 via NASAL
  Filled 2023-09-05: qty 16

## 2023-09-05 NOTE — Plan of Care (Signed)

## 2023-09-05 NOTE — Progress Notes (Signed)
Mobility Specialist - Progress Note  Pre-mobility: HR, BP, SpO2(95)    09/05/23 1100  Mobility  Activity Ambulated with assistance in room;Stood at bedside;Dangled on edge of bed;Transferred from bed to chair  Level of Assistance Contact guard assist, steadying assist  Assistive Device Front wheel walker  Distance Ambulated (ft) 12 ft (8 feet forward and 4 feet backward)  Range of Motion/Exercises Active  Activity Response Tolerated well  Mobility Referral Yes  $Mobility charge 1 Mobility  Mobility Specialist Start Time (ACUTE ONLY) 1000  Mobility Specialist Stop Time (ACUTE ONLY) 1030  Mobility Specialist Time Calculation (min) (ACUTE ONLY) 30 min   Pt resting in bed on 3L upon entry. Pt agreeable to ambulation with encouragement. Pt STS x2 to RW CGA standing for 5 minutes during standing hygiene care before ambulating almost to door and returned to recliner. Pt left in recliner with needs in reach. Spo2 remained above 90 during session.   Johnathan Hausen Mobility Specialist 09/05/23, 11:41 AM

## 2023-09-05 NOTE — Progress Notes (Signed)
[facility]  - [place] [facility]   PATIENT NAME: Bradley Bowers    MR#:  [id number]  DATE OF BIRTH:  Feb 27, 1946  SUBJECTIVE:  no family at bedside. Spoke with wife Bradley Bowers on the phone and updated. Patient afebrile. Feeling a bit better. Asking about home meds and dimensional resume dialysis. Work with mobility therapist in the room. Was fatigued a bit but walked up to the door. Wears 3 L of nasal cannula oxygen chronically.  VITALS:  Blood pressure 136/68, pulse 73, temperature 98.5 F (36.9 C), resp. rate 18, height 5\' 6"  (1.676 m), weight 83 kg, SpO2 99%.  PHYSICAL EXAMINATION:   GENERAL:  77 y.o.-year-old patient with no acute distress. Obese LUNGS: Normal breath sounds bilaterally, no wheezing CARDIOVASCULAR: S1, S2 normal. No murmur   ABDOMEN: Soft, nontender, nondistended. Bowel sounds present.  EXTREMITIES: No  edema b/l.    NEUROLOGIC: nonfocal  patient is alert and awake   LABORATORY PANEL:  CBC Recent Labs  Lab 09/05/23 1041  WBC 9.8  HGB 12.1*  HCT 35.1*  PLT 145*    Chemistries  Recent Labs  Lab 09/03/23 1619 09/04/23 0443 09/05/23 0517  NA 132*   < > 131*  K 3.9   < > 4.1  CL 98   < > 96*  CO2 28   < > 26  GLUCOSE 106*   < > 90  BUN 20   < > 22  CREATININE 1.18   < > 1.04  CALCIUM 8.5*   < > 8.3*  AST 20  --   --   ALT 18  --   --   ALKPHOS 61  --   --   BILITOT 0.9  --   --    < > = values in this interval not displayed.   Cardiac Enzymes No results for input(s): "TROPONINI" in the last 168 hours. RADIOLOGY:  CT HEAD WO CONTRAST ( )  Result Date: 09/03/2023 CLINICAL DATA:  Initial evaluation for acute mental status change. EXAM: CT HEAD WITHOUT CONTRAST TECHNIQUE: Contiguous axial images were obtained from the base of the skull through the vertex without intravenous contrast. RADIATION DOSE REDUCTION: This exam was performed according to the departmental dose-optimization program which includes automated  exposure control, adjustment of the mA and/or kV according to patient size and/or use of iterative reconstruction technique. COMPARISON:  CT from 09/23/2022. FINDINGS: Brain: Cerebral volume within normal limits for age. No mild chronic microvascular ischemic disease. Remote lacunar infarct noted at the right thalamocapsular region. No acute intracranial hemorrhage. No acute cortically based infarct. No mass lesion, midline shift or mass effect. Ventricles stable in size without hydrocephalus. No extra-axial fluid collection. Vascular: No abnormal hyperdense vessel. Scattered vascular calcifications noted within the carotid siphons. Skull: Scalp soft tissues within normal limits.  Calvarium intact. Sinuses/Orbits: Visualized globes and orbital soft tissues within normal limits. Partially visualized paranasal sinuses are largely clear. No mastoid effusion. Other: None. IMPRESSION: 1. No acute intracranial abnormality. 2. Mild chronic microvascular ischemic disease with remote lacunar infarct at the right thalamocapsular region. Electronically Signed   By: Rise Mu M.D.   On: 09/03/2023 20:29   DG Chest Port 1 View  Result Date: 09/03/2023 CLINICAL DATA:  Questionable sepsis - evaluate for abnormality EXAM: PORTABLE CHEST 1 VIEW COMPARISON:  CXR 10/04/22 FINDINGS: Pleural effusion. No pneumothorax. No focal airspace opacity. Normal cardiac and mediastinal contours. No radiographically apparent displaced rib fractures. Visualized upper abdomen unremarkable. IMPRESSION: No focal airspace opacity  Electronically Signed   By: Lorenza Cambridge M.D.   On: 09/03/2023 16:50    Assessment and Plan Bradley Bowers is a 77 y.o. male with history of COPD on 3L chronically, CVA with no residual deficits, HTN, prostate cancer presenting today for altered mental status.  Family at home called out to EMS due to patient being altered today and more weak than normal.  With EMS, he was noted to have a fever 103 F and  was tachycardic but no hypotension   patient is status post placement of fiducial markers by Dr. Lonna Cobb on 25th November in anticipation of his upcoming IMRT with Dr. Rushie Chestnut.   Sepsis suspected due to UTI --Patient had elevated heart rate, respiration rate, leukocytosis of 16.9, organ involvement is encephalopathy --Continue with cefepime and vancomycin per pharmacy -- MRSA PCR positive -- received IV fluids. Blood pressure stable. -- Blood culture negative. Urine culture insignificant growth --WBC 16K--22K -- consider CT scan of the abdomen of patient continues to spike fever   Urinary tract infection in male adenocarcinoma of the prostate status post placement of fiducial markers by Dr. Lonna Cobb on 25th November in anticipation of his upcoming IMRT  --Present on admission ---Patient recently had urologic procedure on Monday, 09/01/2023, markers placed prior to radiation initiation for prostate cancer -- patient did receive antibiotics prior to procedure -- continue broad-spectrum antibiotic with cefepime +vanc-- change to Rocephin -- afebrile today.   Generalized weakness Fall precautions, aspiration precaution -- will arrange home health PT   Expressive aphasia -- CT head no acute abnormality -- speech appears clear   Chronic respiratory failure with hypoxia (HCC) On baseline 3 L nasal cannula   Acute metabolic encephalopathy Etiology workup in progress, suspect secondary to UTI -- mentation back to baseline   Essential hypertension -- resume home meds   Obstructive apnea CPAP nightly ordered   if remains stable will discharge to home tomorrow.  Procedures: Family communication : wife Bradley Bowers Consults : none CODE STATUS: full  DVT Prophylaxis : heparin Level of care: Telemetry Medical Status is: Inpatient Remains inpatient appropriate because: sepsis    TOTAL TIME TAKING CARE OF THIS PATIENT: 50 minutes.  >50% time spent on counselling and  coordination of care  Note: This dictation was prepared with Dragon dictation along with smaller phrase technology. Any transcriptional errors that result from this process are unintentional.  Enedina Finner M.D    [facility]   CC: Primary care physician; Danella Penton, MD

## 2023-09-05 NOTE — TOC Initial Note (Signed)
Transition of Care [facility]) - Initial/Assessment Note    Patient Details  Name: Bradley Bowers MRN: [id number] Date of Birth: August 08, 1946  [facility] CM/SW Contact:    Marlowe Sax, RN Phone Number: 09/05/2023, 1:57 PM  Clinical Narrative:                 The patient comes from home with his wife He has a Hx of lung cancer and COPD. Patient wears 3L [place] baseline at home  [facility] will continue to monitor and assit with DC planning and needs  Expected Discharge Plan: Home w Home Health Services Barriers to Discharge: Continued Medical Work up   Patient Goals and CMS Choice            Expected Discharge Plan and Services   Discharge Planning Services: CM Consult   Living arrangements for the past 2 months: Single Family Home                                      Prior Living Arrangements/Services Living arrangements for the past 2 months: Single Family Home Lives with:: Spouse, Self Patient language and need for interpreter reviewed:: Yes Do you feel safe going back to the place where you live?: Yes      Need for Family Participation in Patient Care: Yes (Comment) Care giver support system in place?: Yes (comment) Current home services: DME (Cane, rolling walker, oxygen) Criminal Activity/Legal Involvement Pertinent to Current Situation/Hospitalization: No - Comment as needed  Activities of Daily Living   ADL Screening (condition at time of admission) Independently performs ADLs?: Yes (appropriate for developmental age) Is the patient deaf or have difficulty hearing?: No Does the patient have difficulty seeing, even when wearing glasses/contacts?: No Does the patient have difficulty concentrating, remembering, or making decisions?: No  Permission Sought/Granted   Permission granted to share information with : Yes, Verbal Permission Granted              Emotional Assessment Appearance:: Appears stated age Attitude/Demeanor/Rapport:  Engaged Affect (typically observed): Accepting Orientation: : Oriented to Self, Oriented to Place, Oriented to  Time, Oriented to Situation Alcohol / Substance Use: Not Applicable Psych Involvement: No (comment)  Admission diagnosis:  Acute cystitis with hematuria [N30.01] Severe sepsis (HCC) [A41.9, R65.20] Severe sepsis with acute organ dysfunction (HCC) [A41.9, R65.20] Sepsis, due to unspecified organism, unspecified whether acute organ dysfunction present [facility]) [A41.9] Patient Active Problem List   Diagnosis Date Noted   Severe sepsis (HCC) 09/04/2023   Severe sepsis with acute organ dysfunction (HCC) 09/03/2023   Chronic respiratory failure with hypoxia (HCC) 09/03/2023   Expressive aphasia 09/03/2023   COVID-19 virus infection 09/24/2022   History of TIA (transient ischemic attack) 09/24/2022   Generalized weakness 09/23/2022   Acute metabolic encephalopathy 09/04/2022   Urinary tract infection in male 08/25/2022   AKI (acute kidney injury) (HCC) 08/25/2022   Parkinsonian features    CVA (cerebral vascular accident) (HCC) 01/31/2022   Essential hypertension 01/30/2022   Peripheral neuropathy 01/30/2022   TIA (transient ischemic attack) 01/29/2022   Acute respiratory failure with hypoxia (HCC) 05/22/2021   COPD with acute exacerbation (HCC) 05/22/2021   Hyperlipemia 05/22/2021   Bipolar 1 disorder (HCC) 05/22/2021   Renal insufficiency 05/22/2021   Leg edema 05/22/2021   Coronary artery disease 09/24/2016   Glaucoma 09/24/2016   Bipolar affective disorder, current episode manic with psychotic symptoms (HCC)  09/23/2016   Chronic obstructive pulmonary disease (COPD) (HCC) 11/06/2015   Gastro-esophageal reflux disease without esophagitis 10/24/2014   Obstructive apnea 10/24/2014   Restless leg 10/24/2014   PCP:  Danella Penton, MD Pharmacy:   [facility] [phone] - Closed - [place], [place] - [address] [place] [address] Phone: [phone]  Fax: [phone]  [id number], [place] - [address] [place] [address] Phone: [phone] Fax: [phone]     Social Determinants of Health (SDOH) Social History: SDOH Screenings   Food Insecurity: Patient Declined (09/05/2023)  Housing: Patient Declined (09/05/2023)  Transportation Needs: Patient Declined (09/05/2023)  Utilities: Patient Declined (09/05/2023)  Alcohol Screen: Low Risk  (08/16/2017)  Depression (PHQ2-9): Low Risk  (04/05/2021)  Financial Resource Strain: Low Risk  (04/30/2023)   Received from [facility]  Tobacco Use: Medium Risk (09/04/2023)   SDOH Interventions:     Readmission Risk Interventions     No data to display

## 2023-09-06 DIAGNOSIS — A419 Sepsis, unspecified organism: Secondary | ICD-10-CM | POA: Diagnosis not present

## 2023-09-06 DIAGNOSIS — R6520 Severe sepsis without septic shock: Secondary | ICD-10-CM | POA: Diagnosis not present

## 2023-09-06 LAB — URINE CULTURE: Culture: NO GROWTH

## 2023-09-06 NOTE — Progress Notes (Signed)
[facility]  -  at [facility]   PATIENT NAME: Bradley Bowers    MR#:  [id number]  DATE OF BIRTH:  15-Sep-1946  SUBJECTIVE:  no family at bedside. Patient afebrile. Feeling a bit better.  VITALS:  Blood pressure 132/65, pulse 72, temperature 98.2 F (36.8 C), temperature source Oral, resp. rate 18, height 5\' 6"  (1.676 m), weight 83 kg, SpO2 99%.  PHYSICAL EXAMINATION:   GENERAL:  77 y.o.-year-old patient with no acute distress. Obese LUNGS: Normal breath sounds bilaterally, no wheezing CARDIOVASCULAR: S1, S2 normal. No murmur   ABDOMEN: Soft, nontender, nondistended. Bowel sounds present.  EXTREMITIES: No  edema b/l.    NEUROLOGIC: nonfocal  patient is alert and awake   LABORATORY PANEL:  CBC Recent Labs  Lab 09/05/23 1041  WBC 9.8  HGB 12.1*  HCT 35.1*  PLT 145*    Chemistries  Recent Labs  Lab 09/03/23 1619 09/04/23 0443 09/05/23 0517  NA 132*   < > 131*  K 3.9   < > 4.1  CL 98   < > 96*  CO2 28   < > 26  GLUCOSE 106*   < > 90  BUN 20   < > 22  CREATININE 1.18   < > 1.04  CALCIUM 8.5*   < > 8.3*  AST 20  --   --   ALT 18  --   --   ALKPHOS 61  --   --   BILITOT 0.9  --   --    < > = values in this interval not displayed.    Assessment and Plan DERMAINE CHABRA is a 77 y.o. male with history of COPD on 3L chronically, CVA with no residual deficits, HTN, prostate cancer presenting today for altered mental status.  Family at home called out to EMS due to patient being altered today and more weak than normal.  With EMS, he was noted to have a fever 103 F and was tachycardic but no hypotension   patient is status post placement of fiducial markers by Dr. Lonna Cobb on 25th November in anticipation of his upcoming IMRT with Dr. Rushie Chestnut.   Sepsis suspected due to UTI --Patient had elevated heart rate, respiration rate, leukocytosis of 16.9, organ involvement is encephalopathy --Continue with cefepime and vancomycin per pharmacy--change  to IV rocephin -- MRSA PCR positive -- received IV fluids. Blood pressure stable. -- Blood culture negative. Urine culture insignificant growth --WBC 16K--22K--9.8 -- consider CT scan of the abdomen of patient continues to spike fever   Urinary tract infection in male adenocarcinoma of the prostate status post placement of fiducial markers by Dr. Lonna Cobb on 25th November in anticipation of his upcoming IMRT  --Present on admission ---Patient recently had urologic procedure on Monday, 09/01/2023, markers placed prior to radiation initiation for prostate cancer -- patient did receive antibiotics prior to procedure -- continue broad-spectrum antibiotic with cefepime +vanc-- change to Rocephin -- afebrile today.   Generalized weakness Fall precautions, aspiration precaution -- will arrange home health PT at d/c   Expressive aphasia -- CT head no acute abnormality -- speech appears clear   Chronic respiratory failure with hypoxia (HCC) On baseline 3 L nasal cannula   Acute metabolic encephalopathy Etiology workup in progress, suspect secondary to UTI -- mentation back to baseline   Essential hypertension -- resume home meds   Obstructive apnea CPAP nightly ordered   if remains stable will discharge to home tomorrow.  Family communication : none today  Consults : none CODE STATUS: full  DVT Prophylaxis : heparin Level of care: Telemetry Medical Status is: Inpatient Remains inpatient appropriate because: sepsis    TOTAL TIME TAKING CARE OF THIS PATIENT: .  >50% time spent on counselling and coordination of care  Note: This dictation was prepared with Dragon dictation along with smaller phrase technology. Any transcriptional errors that result from this process are unintentional.  Enedina Finner M.D    [facility]   CC: Primary care physician; Danella Penton, MD

## 2023-09-06 NOTE — Plan of Care (Signed)

## 2023-09-07 DIAGNOSIS — R6520 Severe sepsis without septic shock: Secondary | ICD-10-CM | POA: Diagnosis not present

## 2023-09-07 DIAGNOSIS — A419 Sepsis, unspecified organism: Secondary | ICD-10-CM | POA: Diagnosis not present

## 2023-09-07 MED ORDER — CEFUROXIME AXETIL 500 MG PO TABS
500.0000 mg | ORAL_TABLET | Freq: Two times a day (BID) | ORAL | Status: DC
  Filled 2023-09-07: qty 1

## 2023-09-07 MED ORDER — CEFUROXIME AXETIL 500 MG PO TABS: 500.0000 mg | ORAL_TABLET | Freq: Two times a day (BID) | ORAL | 0 refills | Status: AC

## 2023-09-07 NOTE — Plan of Care (Signed)
  Problem: Elimination: Goal: Will not experience complications related to bowel motility Outcome: Progressing   Problem: Activity: Goal: Risk for activity intolerance will decrease Outcome: Progressing   

## 2023-09-07 NOTE — Discharge Summary (Signed)
Physician Discharge Summary   Patient: Bradley Bowers MRN: [id number] DOB: Jan 16, 1946  Admit date:     09/03/2023  Discharge date: 09/07/23  Discharge Physician: Bradley Bowers   PCP: Bradley Penton, MD   Recommendations at discharge:   Use your oxygen as before F/u Dr Bradley Bowers for your RT on your appt  Discharge Diagnoses: Principal Problem:   Severe sepsis with acute organ dysfunction Bradley Bowers) Active Problems:   Urinary tract infection in male   Generalized weakness   Chronic obstructive pulmonary disease (COPD) (HCC)   Gastro-esophageal reflux disease without esophagitis   Obstructive apnea   Restless leg   Bipolar affective disorder, current episode manic with psychotic symptoms (HCC)   Hyperlipemia   Essential hypertension   Peripheral neuropathy   CVA (cerebral vascular accident) (HCC)   Parkinsonian features   Acute metabolic encephalopathy   History of TIA (transient ischemic attack)   Chronic respiratory failure with hypoxia (HCC)   Expressive aphasia   Severe sepsis (HCC)   Bradley Bowers is a 77 y.o. male with history of COPD on 3L chronically, CVA with no residual deficits, HTN, prostate cancer presenting today for altered mental status.  Family at home called out to EMS due to patient being altered today and more weak than normal.  With EMS, he was noted to have a fever 103 F and was tachycardic but no hypotension    patient is status post placement of fiducial markers by Dr. Lonna Bowers on 25th November in anticipation of his upcoming IMRT with Dr. Rushie Bowers.    Sepsis suspected due to UTI --Patient had elevated heart rate, respiration rate, leukocytosis of 16.9, organ involvement is encephalopathy --Continue with cefepime and vancomycin per pharmacy--change to IV rocephin--change to po abx  -- MRSA PCR positive -- received IV fluids. Blood pressure stable. -- Blood culture negative. Urine culture insignificant growth --WBC 16K--22K--9.8 -- consider CT scan of  the abdomen of patient continues to spike fever   Urinary tract infection in male adenocarcinoma of the prostate status post placement of fiducial markers by Dr. Lonna Bowers on 25th November in anticipation of his upcoming IMRT  --Present on admission ---Patient recently had urologic procedure on Monday, 09/01/2023, markers placed prior to radiation initiation for prostate cancer -- patient did receive antibiotics prior to procedure -- continue broad-spectrum antibiotic  as above   Generalized weakness Fall precautions, aspiration precaution -- will arrange home health PT at d/c   Expressive aphasia -- CT head no acute abnormality -- speech appears clear   Chronic respiratory failure with hypoxia (HCC) On baseline 3 L nasal cannula   Acute metabolic encephalopathy Etiology workup in progress, suspect secondary to UTI -- mentation back to baseline   Essential hypertension -- resume home meds   Obstructive apnea CPAP nightly ordered   discharge to home with home health. Spoke with wife agreeable Family communication : wife Bradley Bowers Consults : none CODE STATUS: full  DVT Prophylaxis : heparin      Disposition: Home health Diet recommendation:  Discharge Diet Orders (From admission, onward)     Start     Ordered   09/07/23 0000  Diet - low sodium heart healthy        09/07/23 0957           Cardiac diet DISCHARGE MEDICATION: Allergies as of 09/07/2023   No Known Allergies      Medication List     TAKE these medications    albuterol 108 (90 Base) MCG/ACT  inhaler Commonly known as: VENTOLIN HFA Inhale 1-2 puffs into the lungs every 4 (four) hours as needed.   aspirin EC 81 MG tablet Take 81 mg by mouth daily.   atorvastatin 80 MG tablet Commonly known as: LIPITOR Take 80 mg by mouth daily.   carbamazepine 200 MG tablet Commonly known as: TEGRETOL Take 1 tablet by mouth 2 (two) times daily.   cefUROXime 500 MG tablet Commonly known as:  CEFTIN Take 1 tablet (500 mg total) by mouth 2 (two) times daily with a meal for 4 doses.   cholecalciferol 25 MCG (1000 UNIT) tablet Commonly known as: VITAMIN D3 Take 1,000 Units by mouth daily.   cyanocobalamin 1000 MCG tablet Commonly known as: VITAMIN B12 Take 1,000 mcg by mouth daily.   diltiazem 120 MG 24 hr capsule Commonly known as: CARDIZEM CD Take 120 mg by mouth daily.   divalproex 250 MG DR tablet Commonly known as: DEPAKOTE Take 250 mg by mouth daily.   donepezil 10 MG tablet Commonly known as: ARICEPT Take 1 tablet by mouth at bedtime.   feeding supplement Liqd Take 237 mLs by mouth 2 (two) times daily between meals.   fluticasone 50 MCG/ACT nasal spray Commonly known as: FLONASE Place 1 spray into both nostrils daily.   mometasone-formoterol 100-5 MCG/ACT Aero Commonly known as: DULERA Inhale 2 puffs into the lungs 2 (two) times daily.   montelukast 10 MG tablet Commonly known as: SINGULAIR Take 10 mg by mouth at bedtime.   ondansetron 4 MG disintegrating tablet Commonly known as: ZOFRAN-ODT Take 4 mg by mouth every 8 (eight) hours as needed for nausea.   pantoprazole 40 MG tablet Commonly known as: PROTONIX Take 40 mg by mouth 2 (two) times daily.   polyethylene glycol 17 g packet Commonly known as: MIRALAX / GLYCOLAX Take 17 g by mouth daily as needed.   spironolactone 25 MG tablet Commonly known as: ALDACTONE Take 1 tablet by mouth daily.   timolol 0.5 % ophthalmic solution Commonly known as: TIMOPTIC 1 drop 2 (two) times daily.   tiotropium 18 MCG inhalation capsule Commonly known as: SPIRIVA Place 18 mcg into inhaler and inhale daily.   Travatan Z 0.004 % Soln ophthalmic solution Generic drug: Travoprost (BAK Free) Place 1 drop into both eyes at bedtime.        Follow-up Information     Bradley Penton, MD. Schedule an appointment as soon as possible for a visit in 1 week(s).   Specialty: Internal Medicine Contact  information: [address] [facility] [place] [address] [phone]                Discharge Exam: Bradley Bowers Weights   09/03/23 1605  Weight: 83 kg   GENERAL:  77 y.o.-year-old patient with no acute distress. Obese LUNGS: Normal breath sounds bilaterally, no wheezing CARDIOVASCULAR: S1, S2 normal. No murmur   ABDOMEN: Soft, nontender, nondistended. Bowel sounds present.  EXTREMITIES: No  edema b/l.    NEUROLOGIC: nonfocal  patient is alert and awake  Condition at discharge: fair  The results of significant diagnostics from this hospitalization (including imaging, microbiology, ancillary and laboratory) are listed below for reference.   Imaging Studies: CT HEAD WO CONTRAST ( )  Result Date: 09/03/2023 CLINICAL DATA:  Initial evaluation for acute mental status change. EXAM: CT HEAD WITHOUT CONTRAST TECHNIQUE: Contiguous axial images were obtained from the base of the skull through the vertex without intravenous contrast. RADIATION DOSE REDUCTION: This exam was performed according  to the departmental dose-optimization program which includes automated exposure control, adjustment of the mA and/or kV according to patient size and/or use of iterative reconstruction technique. COMPARISON:  CT from 09/23/2022. FINDINGS: Brain: Cerebral volume within normal limits for age. No mild chronic microvascular ischemic disease. Remote lacunar infarct noted at the right thalamocapsular region. No acute intracranial hemorrhage. No acute cortically based infarct. No mass lesion, midline shift or mass effect. Ventricles stable in size without hydrocephalus. No extra-axial fluid collection. Vascular: No abnormal hyperdense vessel. Scattered vascular calcifications noted within the carotid siphons. Skull: Scalp soft tissues within normal limits.  Calvarium intact. Sinuses/Orbits: Visualized globes and orbital soft tissues within normal limits. Partially visualized  paranasal sinuses are largely clear. No mastoid effusion. Other: None. IMPRESSION: 1. No acute intracranial abnormality. 2. Mild chronic microvascular ischemic disease with remote lacunar infarct at the right thalamocapsular region. Electronically Signed   By: Rise Mu M.D.   On: 09/03/2023 20:29   DG Chest Port 1 View  Result Date: 09/03/2023 CLINICAL DATA:  Questionable sepsis - evaluate for abnormality EXAM: PORTABLE CHEST 1 VIEW COMPARISON:  CXR 10/04/22 FINDINGS: Pleural effusion. No pneumothorax. No focal airspace opacity. Normal cardiac and mediastinal contours. No radiographically apparent displaced rib fractures. Visualized upper abdomen unremarkable. IMPRESSION: No focal airspace opacity Electronically Signed   By: Lorenza Cambridge M.D.   On: 09/03/2023 16:50   NM PET (PSMA) SKULL TO MID THIGH  Result Date: 09/03/2023 CLINICAL DATA:  Prostate carcinoma with biochemical recurrence. EXAM: NUCLEAR MEDICINE PET SKULL BASE TO THIGH TECHNIQUE: 8.62 mCi Flotufolastat (Posluma) was injected intravenously. Full-ring PET imaging was performed from the skull base to thigh after the radiotracer. CT data was obtained and used for attenuation correction and anatomic localization. COMPARISON:  Prostate MRI 05/04/2019 FINDINGS: NECK No radiotracer activity in neck lymph nodes. Incidental CT finding: None. CHEST No radiotracer accumulation within mediastinal or hilar lymph nodes. No suspicious pulmonary nodules on the CT scan. Incidental CT finding: None. ABDOMEN/PELVIS Prostate: 2 intense foci radiotracer activity within the posterior base peripheral zone within the RIGHT and LEFT lobe just off midline SUV max equal 5.6 on the RIGHT and 9.2 on the image 140. Lymph nodes: No abnormal radiotracer accumulation within pelvic or abdominal nodes. Liver: No evidence of liver metastasis. Incidental CT finding: Single large gallstone. Benign RIGHT renal cyst. Multiple sigmoid diverticula acute inflammation.  SKELETON Small focus of radiotracer activity in the medial aspect the LEFT ischial bone adjacent the pubic symphysis with SUV max equal 4.5 (image 142). No correlate of CT findings. Second small focus of radiotracer activity the posterior RIGHT fifth rib on image 46. SUV max equal 4.2. No CT correlation. IMPRESSION: 1. Focal activity in the posterior LEFT and RIGHT base of the prostate gland consistent primary prostate adenocarcinoma. 2. No evidence of metastatic adenopathy in the pelvis or periaortic retroperitoneum. 3. No evidence of visceral metastasis. 4. Two small skeletal foci of moderate radiotracer activity. One within the LEFT ischium and the second in a posterior RIGHT rib. Indeterminate findings for metastatic prostate skeletal disease. Electronically Signed   By: Genevive Bi M.D.   On: 09/03/2023 13:04    Microbiology: Results for orders placed or performed during the hospital encounter of 09/03/23  Urine Culture     Status: Abnormal   Collection Time: 09/03/23  4:19 PM   Specimen: Urine, Random  Result Value Ref Range Status   Specimen Description   Final    URINE, RANDOM Performed at [facility], [address], [place], [place] [address]    Special Requests   Final    NONE Reflexed from [phone] [phone] Performed at [facility], [address] [place], [place] [address]    Culture (A)  Final    <10,000 COLONIES/mL INSIGNIFICANT GROWTH Performed at [facility], [address], [place], [place] [address]    Report Status 09/05/2023 FINAL  Final  Resp panel by RT-PCR (RSV, Flu A&B, Covid) Anterior Nasal Swab     Status: None   Collection Time: 09/03/23  4:20 PM   Specimen: Anterior Nasal Swab  Result Value Ref Range Status   SARS Coronavirus 2 by RT PCR NEGATIVE NEGATIVE Final    Comment: (NOTE) SARS-CoV-2 target nucleic acids are NOT DETECTED.  The SARS-CoV-2 RNA is generally detectable in upper respiratory specimens during the acute phase of  infection. The lowest concentration of SARS-CoV-2 viral copies this assay can detect is 138 copies/mL. A negative result does not preclude SARS-Cov-2 infection and should not be used as the sole basis for treatment or other patient management decisions. A negative result may occur with  improper specimen collection/handling, submission of specimen other than nasopharyngeal swab, presence of viral mutation(s) within the areas targeted by this assay, and inadequate number of viral copies(<138 copies/mL). A negative result must be combined with clinical observations, patient history, and epidemiological information. The expected result is Negative.  Fact Sheet for Patients:  BloggerCourse.com  Fact Sheet for Healthcare Providers:  SeriousBroker.it  This test is no t yet approved or cleared by the [place] FDA and  has been authorized for detection and/or diagnosis of SARS-CoV-2 by FDA under an Emergency Use Authorization (EUA). This EUA will remain  in effect (meaning this test can be used) for the duration of the COVID-19 declaration under Section 564(b)(1) of the Act, 21 U.S.C.section 360bbb-3(b)(1), unless the authorization is terminated  or revoked sooner.       Influenza A by PCR NEGATIVE NEGATIVE Final   Influenza B by PCR NEGATIVE NEGATIVE Final    Comment: (NOTE) The Xpert Xpress SARS-CoV-2/FLU/RSV plus assay is intended as an aid in the diagnosis of influenza from Nasopharyngeal swab specimens and should not be used as a sole basis for treatment. Nasal washings and aspirates are unacceptable for Xpert Xpress SARS-CoV-2/FLU/RSV testing.  Fact Sheet for Patients: BloggerCourse.com  Fact Sheet for Healthcare Providers: SeriousBroker.it  This test is not yet approved or cleared by the [place] FDA and has been authorized for detection and/or diagnosis of SARS-CoV-2  by FDA under an Emergency Use Authorization (EUA). This EUA will remain in effect (meaning this test can be used) for the duration of the COVID-19 declaration under Section 564(b)(1) of the Act, 21 U.S.C. section 360bbb-3(b)(1), unless the authorization is terminated or revoked.     Resp Syncytial Virus by PCR NEGATIVE NEGATIVE Final    Comment: (NOTE) Fact Sheet for Patients: BloggerCourse.com  Fact Sheet for Healthcare Providers: SeriousBroker.it  This test is not yet approved or cleared by the [place] FDA and has been authorized for detection and/or diagnosis of SARS-CoV-2 by FDA under an Emergency Use Authorization (EUA). This EUA will remain in effect (meaning this test can be used) for the duration of the COVID-19 declaration under Section 564(b)(1) of the Act, 21 U.S.C. section 360bbb-3(b)(1), unless the authorization is terminated or revoked.  Performed at [facility], [address], [place], [place] [address]   Blood Culture (routine x 2)     Status: None (  Preliminary result)   Collection Time: 09/03/23  4:20 PM   Specimen: BLOOD  Result Value Ref Range Status   Specimen Description BLOOD LEFT ANTECUBITAL  Final   Special Requests   Final    BOTTLES DRAWN AEROBIC AND ANAEROBIC Blood Culture adequate volume   Culture   Final    NO GROWTH 4 DAYS Performed at [facility], [address] [place], [place] [address]    Report Status PENDING  Incomplete  Blood Culture (routine x 2)     Status: None (Preliminary result)   Collection Time: 09/03/23  4:20 PM   Specimen: BLOOD  Result Value Ref Range Status   Specimen Description BLOOD BLOOD LEFT ARM  Final   Special Requests   Final    BOTTLES DRAWN AEROBIC AND ANAEROBIC Blood Culture adequate volume   Culture   Final    NO GROWTH 4 DAYS Performed at [facility], [address], [place], [place] [address]    Report Status PENDING   Incomplete  MRSA Next Gen by PCR, Nasal     Status: Abnormal   Collection Time: 09/03/23 11:55 PM   Specimen: Nasal Mucosa; Nasal Swab  Result Value Ref Range Status   MRSA by PCR Next Gen DETECTED (A) NOT DETECTED Final    Comment: CRITICAL RESULT CALLED TO, READ BACK BY AND VERIFIED WITH: Burke Keels RN ED @ [phone] 09/04/23 lfd (NOTE) The GeneXpert MRSA Assay (FDA approved for NASAL specimens only), is one component of a comprehensive MRSA colonization surveillance program. It is not intended to diagnose MRSA infection nor to guide or monitor treatment for MRSA infections. Test performance is not FDA approved in patients less than 31 years old. Performed at [facility], [address], [place], [place] [address]   Urine Culture     Status: None   Collection Time: 09/05/23 12:02 PM   Specimen: Urine, Random  Result Value Ref Range Status   Specimen Description   Final    URINE, RANDOM Performed at [facility], [address] [place], [place] [address]    Special Requests   Final    NONE Reflexed from [id number] Performed at [facility], [address], [place], [place] [address]    Culture   Final    NO GROWTH Performed at [facility], [address], [place], [place] [address]    Report Status 09/06/2023 FINAL  Final    Labs: CBC: Recent Labs  Lab 09/03/23 1619 09/04/23 0443 09/05/23 1041  WBC 16.9* 22.4* 9.8  NEUTROABS 16.2*  --   --   HGB 12.5* 12.0* 12.1*  HCT 36.7* 35.7* 35.1*  MCV 90.4 90.6 87.5  PLT 149* 155 145*   Basic Metabolic Panel: Recent Labs  Lab 09/03/23 1619 09/04/23 0443 09/05/23 0517  NA 132* 133* 131*  K 3.9 4.1 4.1  CL 98 101 96*  CO2 28 22 26   GLUCOSE 106* 97 90  BUN 20 18 22   CREATININE 1.18 1.08 1.04  CALCIUM 8.5* 7.8* 8.3*   Liver Function Tests: Recent Labs  Lab 09/03/23 1619  AST 20  ALT 18  ALKPHOS 61  BILITOT 0.9  PROT 6.9  ALBUMIN 3.3*     Discharge time spent: greater  than 30 minutes.  Signed: Enedina Finner, MD [facility] 09/07/2023

## 2023-09-07 NOTE — TOC Progression Note (Signed)
Transition of Care [facility]) - Progression Note    Patient Details  Name: Bradley Bowers MRN: [id number] Date of Birth: 1945-11-11  Transition of Care [facility]) CM/SW Contact  Susa Simmonds, [place] Phone Number: 09/07/2023, 10:05 AM  Clinical Narrative:   CSW spoke with patient who would like home health PT at discharge. Patient stated he has had home health in the past but can't remember the agency. Patient would like the same agency. CSW sees in past notes that patient had [facility].   CSW contacted [place] with [facility] to confirm they can take patient. CSW is waiting for a response back.    Expected Discharge Plan: Home w Home Health Services Barriers to Discharge: Continued Medical Work up  Expected Discharge Plan and Services   Discharge Planning Services: CM Consult   Living arrangements for the past 2 months: Single Family Home Expected Discharge Date: 09/07/23                                     Social Determinants of Health (SDOH) Interventions SDOH Screenings   Food Insecurity: Patient Declined (09/05/2023)  Housing: Patient Declined (09/05/2023)  Transportation Needs: Patient Declined (09/05/2023)  Utilities: Patient Declined (09/05/2023)  Alcohol Screen: Low Risk  (08/16/2017)  Depression (PHQ2-9): Low Risk  (04/05/2021)  Financial Resource Strain: Low Risk  (04/30/2023)   Received from [facility]  Tobacco Use: Medium Risk (09/04/2023)    Readmission Risk Interventions     No data to display

## 2023-09-07 NOTE — Discharge Instructions (Signed)
Use your oxygen as before F/u Dr Rushie Chestnut for your RT on your appt

## 2023-09-07 NOTE — TOC Progression Note (Signed)
Transition of Care [name]) - Progression Note    Patient Details  Name: Bradley Bowers MRN: [id number] Date of Birth: 04/17/1946  Transition of Care [facility]) CM/SW Contact  Susa Simmonds, [place] Phone Number: 09/07/2023, 11:33 AM  Clinical Narrative:   CSW confirmed with Kandee Keen from [place] that they can take patient for home health PT     Expected Discharge Plan: Home w Home Health Services Barriers to Discharge: Continued Medical Work up  Expected Discharge Plan and Services   Discharge Planning Services: CM Consult   Living arrangements for the past 2 months: Single Family Home Expected Discharge Date: 09/07/23                                     Social Determinants of Health (SDOH) Interventions SDOH Screenings   Food Insecurity: Patient Declined (09/05/2023)  Housing: Patient Declined (09/05/2023)  Transportation Needs: Patient Declined (09/05/2023)  Utilities: Patient Declined (09/05/2023)  Alcohol Screen: Low Risk  (08/16/2017)  Depression (PHQ2-9): Low Risk  (04/05/2021)  Financial Resource Strain: Low Risk  (04/30/2023)   Received from [facility]  Tobacco Use: Medium Risk (09/04/2023)    Readmission Risk Interventions     No data to display

## 2023-09-08 LAB — CULTURE, BLOOD (ROUTINE X 2)
Culture: NO GROWTH
Culture: NO GROWTH
Special Requests: ADEQUATE
Special Requests: ADEQUATE

## 2023-09-09 ENCOUNTER — Ambulatory Visit
Admission: RE | Admit: 2023-09-09 | Discharge: 2023-09-09 | Disposition: A | Discharge status: Home / Self Care | Payer: Medicare HMO | Source: Ambulatory Visit | Attending: Radiation Oncology | Admitting: Radiation Oncology

## 2023-09-09 ENCOUNTER — Institutional Professional Consult (permissible substitution): Payer: Medicare HMO | Admitting: Radiation Oncology

## 2023-09-09 DIAGNOSIS — Z993 Dependence on wheelchair: Secondary | ICD-10-CM | POA: Insufficient documentation

## 2023-09-09 DIAGNOSIS — J432 Centrilobular emphysema: Secondary | ICD-10-CM | POA: Insufficient documentation

## 2023-09-09 DIAGNOSIS — Z7982 Long term (current) use of aspirin: Secondary | ICD-10-CM | POA: Diagnosis not present

## 2023-09-09 DIAGNOSIS — H409 Unspecified glaucoma: Secondary | ICD-10-CM | POA: Insufficient documentation

## 2023-09-09 DIAGNOSIS — G4730 Sleep apnea, unspecified: Secondary | ICD-10-CM | POA: Diagnosis not present

## 2023-09-09 DIAGNOSIS — I2510 Atherosclerotic heart disease of native coronary artery without angina pectoris: Secondary | ICD-10-CM | POA: Insufficient documentation

## 2023-09-09 DIAGNOSIS — K219 Gastro-esophageal reflux disease without esophagitis: Secondary | ICD-10-CM | POA: Insufficient documentation

## 2023-09-09 DIAGNOSIS — E785 Hyperlipidemia, unspecified: Secondary | ICD-10-CM | POA: Diagnosis not present

## 2023-09-09 DIAGNOSIS — Z87891 Personal history of nicotine dependence: Secondary | ICD-10-CM | POA: Insufficient documentation

## 2023-09-09 DIAGNOSIS — I252 Old myocardial infarction: Secondary | ICD-10-CM | POA: Insufficient documentation

## 2023-09-09 DIAGNOSIS — Z191 Hormone sensitive malignancy status: Secondary | ICD-10-CM | POA: Diagnosis not present

## 2023-09-09 DIAGNOSIS — C61 Malignant neoplasm of prostate: Secondary | ICD-10-CM | POA: Insufficient documentation

## 2023-09-09 DIAGNOSIS — Z7902 Long term (current) use of antithrombotics/antiplatelets: Secondary | ICD-10-CM | POA: Insufficient documentation

## 2023-09-09 DIAGNOSIS — Z79899 Other long term (current) drug therapy: Secondary | ICD-10-CM | POA: Diagnosis not present

## 2023-09-09 DIAGNOSIS — Z9981 Dependence on supplemental oxygen: Secondary | ICD-10-CM | POA: Insufficient documentation

## 2023-09-09 DIAGNOSIS — C801 Malignant (primary) neoplasm, unspecified: Secondary | ICD-10-CM

## 2023-09-09 DIAGNOSIS — G2581 Restless legs syndrome: Secondary | ICD-10-CM | POA: Insufficient documentation

## 2023-09-09 DIAGNOSIS — Z7951 Long term (current) use of inhaled steroids: Secondary | ICD-10-CM | POA: Diagnosis not present

## 2023-09-09 DIAGNOSIS — I10 Essential (primary) hypertension: Secondary | ICD-10-CM | POA: Insufficient documentation

## 2023-09-09 NOTE — Consult Note (Signed)
[facility] Liaison Note  09/09/2023  [name] [birth year] [id number]  Location: RN Hospital Liaison screened the patient remotely at [facility].  Insurance: [employer] HMO   Bradley Bowers is a 77 y.o. male who is a Primary Care Patient of Danella Penton, MD at [facility]. The patient was screened for  readmission hospitalization with noted medium risk score for unplanned readmission risk with 1 IP in 6 months.  The patient was assessed for potential Care Management service needs for post hospital transition for care coordination. Review of patient's electronic medical record reveals patient was admitted with Severe Sepsis. Pt discharged with [facility] services. PCP will provide the transition of care follow up call. No anticipated needs for VBCI to address at this time.  VBCI Care Management/Population Health does not replace or interfere with any arrangements made by the Inpatient Transition of Care team.   For questions contact:   Elliot Cousin, RN, [facility] Liaison [place]   [facility], Population Health Office Hours MTWF  8:00 am-6:00 pm Direct Dial: [phone] mobile [phone] [Office toll free line] Office Hours are M-F 8:30 - 5 pm [email or web address]

## 2023-09-11 ENCOUNTER — Other Ambulatory Visit: Payer: Self-pay | Admitting: *Deleted

## 2023-09-11 DIAGNOSIS — C61 Malignant neoplasm of prostate: Secondary | ICD-10-CM

## 2023-09-15 DIAGNOSIS — J432 Centrilobular emphysema: Secondary | ICD-10-CM | POA: Diagnosis not present

## 2023-09-15 DIAGNOSIS — I2510 Atherosclerotic heart disease of native coronary artery without angina pectoris: Secondary | ICD-10-CM | POA: Diagnosis not present

## 2023-09-15 DIAGNOSIS — I10 Essential (primary) hypertension: Secondary | ICD-10-CM | POA: Diagnosis not present

## 2023-09-15 DIAGNOSIS — E785 Hyperlipidemia, unspecified: Secondary | ICD-10-CM | POA: Diagnosis not present

## 2023-09-15 DIAGNOSIS — C61 Malignant neoplasm of prostate: Secondary | ICD-10-CM | POA: Diagnosis not present

## 2023-09-15 DIAGNOSIS — H409 Unspecified glaucoma: Secondary | ICD-10-CM | POA: Diagnosis not present

## 2023-09-15 DIAGNOSIS — Z191 Hormone sensitive malignancy status: Secondary | ICD-10-CM | POA: Diagnosis not present

## 2023-09-15 DIAGNOSIS — K219 Gastro-esophageal reflux disease without esophagitis: Secondary | ICD-10-CM | POA: Diagnosis not present

## 2023-09-15 DIAGNOSIS — G2581 Restless legs syndrome: Secondary | ICD-10-CM | POA: Diagnosis not present

## 2023-09-15 DIAGNOSIS — G4730 Sleep apnea, unspecified: Secondary | ICD-10-CM | POA: Diagnosis not present

## 2023-09-16 ENCOUNTER — Ambulatory Visit: Admission: RE | Admit: 2023-09-16 | Payer: Medicare HMO | Source: Ambulatory Visit

## 2023-09-17 ENCOUNTER — Ambulatory Visit: Payer: Medicare HMO

## 2023-09-17 ENCOUNTER — Ambulatory Visit
Admission: RE | Admit: 2023-09-17 | Discharge: 2023-09-17 | Disposition: A | Discharge status: Home / Self Care | Payer: Medicare HMO | Source: Ambulatory Visit | Attending: Radiation Oncology | Admitting: Radiation Oncology

## 2023-09-17 ENCOUNTER — Other Ambulatory Visit: Payer: Self-pay

## 2023-09-17 DIAGNOSIS — G2581 Restless legs syndrome: Secondary | ICD-10-CM | POA: Diagnosis not present

## 2023-09-17 DIAGNOSIS — I10 Essential (primary) hypertension: Secondary | ICD-10-CM | POA: Diagnosis not present

## 2023-09-17 DIAGNOSIS — E785 Hyperlipidemia, unspecified: Secondary | ICD-10-CM | POA: Diagnosis not present

## 2023-09-17 DIAGNOSIS — J432 Centrilobular emphysema: Secondary | ICD-10-CM | POA: Diagnosis not present

## 2023-09-17 DIAGNOSIS — H409 Unspecified glaucoma: Secondary | ICD-10-CM | POA: Diagnosis not present

## 2023-09-17 DIAGNOSIS — I2510 Atherosclerotic heart disease of native coronary artery without angina pectoris: Secondary | ICD-10-CM | POA: Diagnosis not present

## 2023-09-17 DIAGNOSIS — G4730 Sleep apnea, unspecified: Secondary | ICD-10-CM | POA: Diagnosis not present

## 2023-09-17 DIAGNOSIS — C61 Malignant neoplasm of prostate: Secondary | ICD-10-CM | POA: Diagnosis not present

## 2023-09-17 DIAGNOSIS — Z191 Hormone sensitive malignancy status: Secondary | ICD-10-CM | POA: Diagnosis not present

## 2023-09-17 DIAGNOSIS — Z510 Encounter for antineoplastic radiation therapy: Secondary | ICD-10-CM | POA: Diagnosis not present

## 2023-09-17 DIAGNOSIS — K219 Gastro-esophageal reflux disease without esophagitis: Secondary | ICD-10-CM | POA: Diagnosis not present

## 2023-09-17 LAB — RAD ONC ARIA SESSION SUMMARY
Course Elapsed Days: 0
Plan Fractions Treated to Date: 1
Plan Prescribed Dose Per Fraction: 2.5 Gy
Plan Total Fractions Prescribed: 28
Plan Total Prescribed Dose: 70 Gy
Reference Point Dosage Given to Date: 2.5 Gy
Reference Point Session Dosage Given: 2.5 Gy
Session Number: 1

## 2023-09-18 ENCOUNTER — Ambulatory Visit: Payer: Medicare HMO

## 2023-09-18 ENCOUNTER — Other Ambulatory Visit: Payer: Self-pay

## 2023-09-18 ENCOUNTER — Ambulatory Visit
Admission: RE | Admit: 2023-09-18 | Discharge: 2023-09-18 | Disposition: A | Discharge status: Home / Self Care | Payer: Medicare HMO | Source: Ambulatory Visit | Attending: Radiation Oncology | Admitting: Radiation Oncology

## 2023-09-18 DIAGNOSIS — G2581 Restless legs syndrome: Secondary | ICD-10-CM | POA: Diagnosis not present

## 2023-09-18 DIAGNOSIS — H409 Unspecified glaucoma: Secondary | ICD-10-CM | POA: Diagnosis not present

## 2023-09-18 DIAGNOSIS — J432 Centrilobular emphysema: Secondary | ICD-10-CM | POA: Diagnosis not present

## 2023-09-18 DIAGNOSIS — G4730 Sleep apnea, unspecified: Secondary | ICD-10-CM | POA: Diagnosis not present

## 2023-09-18 DIAGNOSIS — Z510 Encounter for antineoplastic radiation therapy: Secondary | ICD-10-CM | POA: Diagnosis not present

## 2023-09-18 DIAGNOSIS — I2510 Atherosclerotic heart disease of native coronary artery without angina pectoris: Secondary | ICD-10-CM | POA: Diagnosis not present

## 2023-09-18 DIAGNOSIS — I10 Essential (primary) hypertension: Secondary | ICD-10-CM | POA: Diagnosis not present

## 2023-09-18 DIAGNOSIS — E785 Hyperlipidemia, unspecified: Secondary | ICD-10-CM | POA: Diagnosis not present

## 2023-09-18 DIAGNOSIS — C61 Malignant neoplasm of prostate: Secondary | ICD-10-CM | POA: Diagnosis not present

## 2023-09-18 DIAGNOSIS — Z191 Hormone sensitive malignancy status: Secondary | ICD-10-CM | POA: Diagnosis not present

## 2023-09-18 DIAGNOSIS — K219 Gastro-esophageal reflux disease without esophagitis: Secondary | ICD-10-CM | POA: Diagnosis not present

## 2023-09-18 LAB — RAD ONC ARIA SESSION SUMMARY
Course Elapsed Days: 1
Plan Fractions Treated to Date: 2
Plan Prescribed Dose Per Fraction: 2.5 Gy
Plan Total Fractions Prescribed: 28
Plan Total Prescribed Dose: 70 Gy
Reference Point Dosage Given to Date: 5 Gy
Reference Point Session Dosage Given: 2.5 Gy
Session Number: 2

## 2023-09-19 ENCOUNTER — Ambulatory Visit: Payer: Medicare HMO

## 2023-09-19 ENCOUNTER — Ambulatory Visit
Admission: RE | Admit: 2023-09-19 | Discharge: 2023-09-19 | Disposition: A | Discharge status: Home / Self Care | Payer: Medicare HMO | Source: Ambulatory Visit | Attending: Radiation Oncology | Admitting: Radiation Oncology

## 2023-09-19 ENCOUNTER — Other Ambulatory Visit: Payer: Self-pay

## 2023-09-19 DIAGNOSIS — E785 Hyperlipidemia, unspecified: Secondary | ICD-10-CM | POA: Diagnosis not present

## 2023-09-19 DIAGNOSIS — K219 Gastro-esophageal reflux disease without esophagitis: Secondary | ICD-10-CM | POA: Diagnosis not present

## 2023-09-19 DIAGNOSIS — Z191 Hormone sensitive malignancy status: Secondary | ICD-10-CM | POA: Diagnosis not present

## 2023-09-19 DIAGNOSIS — I2510 Atherosclerotic heart disease of native coronary artery without angina pectoris: Secondary | ICD-10-CM | POA: Diagnosis not present

## 2023-09-19 DIAGNOSIS — Z510 Encounter for antineoplastic radiation therapy: Secondary | ICD-10-CM | POA: Diagnosis not present

## 2023-09-19 DIAGNOSIS — G2581 Restless legs syndrome: Secondary | ICD-10-CM | POA: Diagnosis not present

## 2023-09-19 DIAGNOSIS — C61 Malignant neoplasm of prostate: Secondary | ICD-10-CM | POA: Diagnosis not present

## 2023-09-19 DIAGNOSIS — J432 Centrilobular emphysema: Secondary | ICD-10-CM | POA: Diagnosis not present

## 2023-09-19 DIAGNOSIS — G4730 Sleep apnea, unspecified: Secondary | ICD-10-CM | POA: Diagnosis not present

## 2023-09-19 DIAGNOSIS — I10 Essential (primary) hypertension: Secondary | ICD-10-CM | POA: Diagnosis not present

## 2023-09-19 DIAGNOSIS — H409 Unspecified glaucoma: Secondary | ICD-10-CM | POA: Diagnosis not present

## 2023-09-19 LAB — RAD ONC ARIA SESSION SUMMARY
Course Elapsed Days: 2
Plan Fractions Treated to Date: 3
Plan Prescribed Dose Per Fraction: 2.5 Gy
Plan Total Fractions Prescribed: 28
Plan Total Prescribed Dose: 70 Gy
Reference Point Dosage Given to Date: 7.5 Gy
Reference Point Session Dosage Given: 2.5 Gy
Session Number: 3

## 2023-09-22 ENCOUNTER — Other Ambulatory Visit: Payer: Self-pay

## 2023-09-22 ENCOUNTER — Ambulatory Visit
Admission: RE | Admit: 2023-09-22 | Discharge: 2023-09-22 | Disposition: A | Discharge status: Home / Self Care | Payer: Medicare HMO | Source: Ambulatory Visit | Attending: Radiation Oncology | Admitting: Radiation Oncology

## 2023-09-22 ENCOUNTER — Ambulatory Visit: Payer: Medicare HMO

## 2023-09-22 DIAGNOSIS — I2510 Atherosclerotic heart disease of native coronary artery without angina pectoris: Secondary | ICD-10-CM | POA: Diagnosis not present

## 2023-09-22 DIAGNOSIS — H409 Unspecified glaucoma: Secondary | ICD-10-CM | POA: Diagnosis not present

## 2023-09-22 DIAGNOSIS — J432 Centrilobular emphysema: Secondary | ICD-10-CM | POA: Diagnosis not present

## 2023-09-22 DIAGNOSIS — Z510 Encounter for antineoplastic radiation therapy: Secondary | ICD-10-CM | POA: Diagnosis not present

## 2023-09-22 DIAGNOSIS — K219 Gastro-esophageal reflux disease without esophagitis: Secondary | ICD-10-CM | POA: Diagnosis not present

## 2023-09-22 DIAGNOSIS — C61 Malignant neoplasm of prostate: Secondary | ICD-10-CM | POA: Diagnosis not present

## 2023-09-22 DIAGNOSIS — E785 Hyperlipidemia, unspecified: Secondary | ICD-10-CM | POA: Diagnosis not present

## 2023-09-22 DIAGNOSIS — G4730 Sleep apnea, unspecified: Secondary | ICD-10-CM | POA: Diagnosis not present

## 2023-09-22 DIAGNOSIS — Z191 Hormone sensitive malignancy status: Secondary | ICD-10-CM | POA: Diagnosis not present

## 2023-09-22 DIAGNOSIS — G2581 Restless legs syndrome: Secondary | ICD-10-CM | POA: Diagnosis not present

## 2023-09-22 DIAGNOSIS — I10 Essential (primary) hypertension: Secondary | ICD-10-CM | POA: Diagnosis not present

## 2023-09-22 LAB — RAD ONC ARIA SESSION SUMMARY
Course Elapsed Days: 5
Plan Fractions Treated to Date: 4
Plan Prescribed Dose Per Fraction: 2.5 Gy
Plan Total Fractions Prescribed: 28
Plan Total Prescribed Dose: 70 Gy
Reference Point Dosage Given to Date: 10 Gy
Reference Point Session Dosage Given: 2.5 Gy
Session Number: 4

## 2023-09-23 ENCOUNTER — Ambulatory Visit: Payer: Medicare HMO

## 2023-09-23 ENCOUNTER — Ambulatory Visit
Admission: RE | Admit: 2023-09-23 | Discharge: 2023-09-23 | Disposition: A | Discharge status: Home / Self Care | Payer: Medicare HMO | Source: Ambulatory Visit | Attending: Radiation Oncology | Admitting: Radiation Oncology

## 2023-09-23 ENCOUNTER — Other Ambulatory Visit: Payer: Self-pay

## 2023-09-23 DIAGNOSIS — G4730 Sleep apnea, unspecified: Secondary | ICD-10-CM | POA: Diagnosis not present

## 2023-09-23 DIAGNOSIS — I10 Essential (primary) hypertension: Secondary | ICD-10-CM | POA: Diagnosis not present

## 2023-09-23 DIAGNOSIS — H409 Unspecified glaucoma: Secondary | ICD-10-CM | POA: Diagnosis not present

## 2023-09-23 DIAGNOSIS — Z510 Encounter for antineoplastic radiation therapy: Secondary | ICD-10-CM | POA: Diagnosis not present

## 2023-09-23 DIAGNOSIS — J432 Centrilobular emphysema: Secondary | ICD-10-CM | POA: Diagnosis not present

## 2023-09-23 DIAGNOSIS — G2581 Restless legs syndrome: Secondary | ICD-10-CM | POA: Diagnosis not present

## 2023-09-23 DIAGNOSIS — I2510 Atherosclerotic heart disease of native coronary artery without angina pectoris: Secondary | ICD-10-CM | POA: Diagnosis not present

## 2023-09-23 DIAGNOSIS — Z191 Hormone sensitive malignancy status: Secondary | ICD-10-CM | POA: Diagnosis not present

## 2023-09-23 DIAGNOSIS — C61 Malignant neoplasm of prostate: Secondary | ICD-10-CM | POA: Diagnosis not present

## 2023-09-23 DIAGNOSIS — E785 Hyperlipidemia, unspecified: Secondary | ICD-10-CM | POA: Diagnosis not present

## 2023-09-23 DIAGNOSIS — K219 Gastro-esophageal reflux disease without esophagitis: Secondary | ICD-10-CM | POA: Diagnosis not present

## 2023-09-23 LAB — RAD ONC ARIA SESSION SUMMARY
Course Elapsed Days: 6
Plan Fractions Treated to Date: 5
Plan Prescribed Dose Per Fraction: 2.5 Gy
Plan Total Fractions Prescribed: 28
Plan Total Prescribed Dose: 70 Gy
Reference Point Dosage Given to Date: 12.5 Gy
Reference Point Session Dosage Given: 2.5 Gy
Session Number: 5

## 2023-09-24 ENCOUNTER — Inpatient Hospital Stay: Payer: Medicare HMO

## 2023-09-24 ENCOUNTER — Ambulatory Visit
Admission: RE | Admit: 2023-09-24 | Discharge: 2023-09-24 | Disposition: A | Discharge status: Home / Self Care | Payer: Medicare HMO | Source: Ambulatory Visit | Attending: Radiation Oncology | Admitting: Radiation Oncology

## 2023-09-24 ENCOUNTER — Ambulatory Visit: Payer: Medicare HMO

## 2023-09-24 ENCOUNTER — Other Ambulatory Visit: Payer: Self-pay

## 2023-09-24 DIAGNOSIS — Z510 Encounter for antineoplastic radiation therapy: Secondary | ICD-10-CM | POA: Diagnosis not present

## 2023-09-24 DIAGNOSIS — C61 Malignant neoplasm of prostate: Secondary | ICD-10-CM | POA: Insufficient documentation

## 2023-09-24 DIAGNOSIS — I2510 Atherosclerotic heart disease of native coronary artery without angina pectoris: Secondary | ICD-10-CM | POA: Diagnosis not present

## 2023-09-24 DIAGNOSIS — H409 Unspecified glaucoma: Secondary | ICD-10-CM | POA: Diagnosis not present

## 2023-09-24 DIAGNOSIS — Z191 Hormone sensitive malignancy status: Secondary | ICD-10-CM | POA: Diagnosis not present

## 2023-09-24 DIAGNOSIS — K219 Gastro-esophageal reflux disease without esophagitis: Secondary | ICD-10-CM | POA: Diagnosis not present

## 2023-09-24 DIAGNOSIS — Z79899 Other long term (current) drug therapy: Secondary | ICD-10-CM | POA: Insufficient documentation

## 2023-09-24 DIAGNOSIS — J432 Centrilobular emphysema: Secondary | ICD-10-CM | POA: Diagnosis not present

## 2023-09-24 DIAGNOSIS — E785 Hyperlipidemia, unspecified: Secondary | ICD-10-CM | POA: Diagnosis not present

## 2023-09-24 DIAGNOSIS — G2581 Restless legs syndrome: Secondary | ICD-10-CM | POA: Diagnosis not present

## 2023-09-24 DIAGNOSIS — I10 Essential (primary) hypertension: Secondary | ICD-10-CM | POA: Diagnosis not present

## 2023-09-24 DIAGNOSIS — G4730 Sleep apnea, unspecified: Secondary | ICD-10-CM | POA: Diagnosis not present

## 2023-09-24 LAB — RAD ONC ARIA SESSION SUMMARY
Course Elapsed Days: 7
Plan Fractions Treated to Date: 6
Plan Prescribed Dose Per Fraction: 2.5 Gy
Plan Total Fractions Prescribed: 28
Plan Total Prescribed Dose: 70 Gy
Reference Point Dosage Given to Date: 15 Gy
Reference Point Session Dosage Given: 2.5 Gy
Session Number: 6

## 2023-09-24 LAB — CBC (CANCER CENTER ONLY)
HCT: 39.7 % (ref 39.0–52.0)
Hemoglobin: 13.3 g/dL (ref 13.0–17.0)
MCH: 30.5 pg (ref 26.0–34.0)
MCHC: 33.5 g/dL (ref 30.0–36.0)
MCV: 91.1 fL (ref 80.0–100.0)
Platelet Count: 212 10*3/uL (ref 150–400)
RBC: 4.36 MIL/uL (ref 4.22–5.81)
RDW: 12.8 % (ref 11.5–15.5)
WBC Count: 8 10*3/uL (ref 4.0–10.5)
nRBC: 0 % (ref 0.0–0.2)

## 2023-09-25 ENCOUNTER — Other Ambulatory Visit: Payer: Self-pay

## 2023-09-25 ENCOUNTER — Ambulatory Visit
Admission: RE | Admit: 2023-09-25 | Discharge: 2023-09-25 | Disposition: A | Discharge status: Home / Self Care | Payer: Medicare HMO | Source: Ambulatory Visit | Attending: Radiation Oncology | Admitting: Radiation Oncology

## 2023-09-25 ENCOUNTER — Ambulatory Visit: Payer: Medicare HMO

## 2023-09-25 DIAGNOSIS — J432 Centrilobular emphysema: Secondary | ICD-10-CM | POA: Diagnosis not present

## 2023-09-25 DIAGNOSIS — G4730 Sleep apnea, unspecified: Secondary | ICD-10-CM | POA: Diagnosis not present

## 2023-09-25 DIAGNOSIS — Z191 Hormone sensitive malignancy status: Secondary | ICD-10-CM | POA: Diagnosis not present

## 2023-09-25 DIAGNOSIS — H409 Unspecified glaucoma: Secondary | ICD-10-CM | POA: Diagnosis not present

## 2023-09-25 DIAGNOSIS — I10 Essential (primary) hypertension: Secondary | ICD-10-CM | POA: Diagnosis not present

## 2023-09-25 DIAGNOSIS — K219 Gastro-esophageal reflux disease without esophagitis: Secondary | ICD-10-CM | POA: Diagnosis not present

## 2023-09-25 DIAGNOSIS — G2581 Restless legs syndrome: Secondary | ICD-10-CM | POA: Diagnosis not present

## 2023-09-25 DIAGNOSIS — I2510 Atherosclerotic heart disease of native coronary artery without angina pectoris: Secondary | ICD-10-CM | POA: Diagnosis not present

## 2023-09-25 DIAGNOSIS — E785 Hyperlipidemia, unspecified: Secondary | ICD-10-CM | POA: Diagnosis not present

## 2023-09-25 DIAGNOSIS — Z510 Encounter for antineoplastic radiation therapy: Secondary | ICD-10-CM | POA: Diagnosis not present

## 2023-09-25 DIAGNOSIS — C61 Malignant neoplasm of prostate: Secondary | ICD-10-CM | POA: Diagnosis not present

## 2023-09-25 LAB — RAD ONC ARIA SESSION SUMMARY
Course Elapsed Days: 8
Plan Fractions Treated to Date: 7
Plan Prescribed Dose Per Fraction: 2.5 Gy
Plan Total Fractions Prescribed: 28
Plan Total Prescribed Dose: 70 Gy
Reference Point Dosage Given to Date: 17.5 Gy
Reference Point Session Dosage Given: 2.5 Gy
Session Number: 7

## 2023-09-26 ENCOUNTER — Ambulatory Visit: Payer: Medicare HMO

## 2023-09-26 ENCOUNTER — Other Ambulatory Visit: Payer: Self-pay

## 2023-09-26 ENCOUNTER — Ambulatory Visit
Admission: RE | Admit: 2023-09-26 | Discharge: 2023-09-26 | Disposition: A | Discharge status: Home / Self Care | Payer: Medicare HMO | Source: Ambulatory Visit | Attending: Radiation Oncology | Admitting: Radiation Oncology

## 2023-09-26 DIAGNOSIS — K219 Gastro-esophageal reflux disease without esophagitis: Secondary | ICD-10-CM | POA: Diagnosis not present

## 2023-09-26 DIAGNOSIS — H409 Unspecified glaucoma: Secondary | ICD-10-CM | POA: Diagnosis not present

## 2023-09-26 DIAGNOSIS — Z510 Encounter for antineoplastic radiation therapy: Secondary | ICD-10-CM | POA: Diagnosis not present

## 2023-09-26 DIAGNOSIS — G4730 Sleep apnea, unspecified: Secondary | ICD-10-CM | POA: Diagnosis not present

## 2023-09-26 DIAGNOSIS — E785 Hyperlipidemia, unspecified: Secondary | ICD-10-CM | POA: Diagnosis not present

## 2023-09-26 DIAGNOSIS — J432 Centrilobular emphysema: Secondary | ICD-10-CM | POA: Diagnosis not present

## 2023-09-26 DIAGNOSIS — Z191 Hormone sensitive malignancy status: Secondary | ICD-10-CM | POA: Diagnosis not present

## 2023-09-26 DIAGNOSIS — G2581 Restless legs syndrome: Secondary | ICD-10-CM | POA: Diagnosis not present

## 2023-09-26 DIAGNOSIS — C61 Malignant neoplasm of prostate: Secondary | ICD-10-CM | POA: Diagnosis not present

## 2023-09-26 DIAGNOSIS — I2510 Atherosclerotic heart disease of native coronary artery without angina pectoris: Secondary | ICD-10-CM | POA: Diagnosis not present

## 2023-09-26 DIAGNOSIS — I10 Essential (primary) hypertension: Secondary | ICD-10-CM | POA: Diagnosis not present

## 2023-09-26 LAB — RAD ONC ARIA SESSION SUMMARY
Course Elapsed Days: 9
Plan Fractions Treated to Date: 8
Plan Prescribed Dose Per Fraction: 2.5 Gy
Plan Total Fractions Prescribed: 28
Plan Total Prescribed Dose: 70 Gy
Reference Point Dosage Given to Date: 20 Gy
Reference Point Session Dosage Given: 2.5 Gy
Session Number: 8

## 2023-09-29 ENCOUNTER — Other Ambulatory Visit: Payer: Self-pay

## 2023-09-29 ENCOUNTER — Ambulatory Visit
Admission: RE | Admit: 2023-09-29 | Discharge: 2023-09-29 | Disposition: A | Discharge status: Home / Self Care | Payer: Medicare HMO | Source: Ambulatory Visit | Attending: Radiation Oncology | Admitting: Radiation Oncology

## 2023-09-29 ENCOUNTER — Ambulatory Visit: Payer: Medicare HMO

## 2023-09-29 DIAGNOSIS — G2581 Restless legs syndrome: Secondary | ICD-10-CM | POA: Diagnosis not present

## 2023-09-29 DIAGNOSIS — J432 Centrilobular emphysema: Secondary | ICD-10-CM | POA: Diagnosis not present

## 2023-09-29 DIAGNOSIS — I10 Essential (primary) hypertension: Secondary | ICD-10-CM | POA: Diagnosis not present

## 2023-09-29 DIAGNOSIS — H409 Unspecified glaucoma: Secondary | ICD-10-CM | POA: Diagnosis not present

## 2023-09-29 DIAGNOSIS — K219 Gastro-esophageal reflux disease without esophagitis: Secondary | ICD-10-CM | POA: Diagnosis not present

## 2023-09-29 DIAGNOSIS — Z510 Encounter for antineoplastic radiation therapy: Secondary | ICD-10-CM | POA: Diagnosis not present

## 2023-09-29 DIAGNOSIS — E785 Hyperlipidemia, unspecified: Secondary | ICD-10-CM | POA: Diagnosis not present

## 2023-09-29 DIAGNOSIS — I2510 Atherosclerotic heart disease of native coronary artery without angina pectoris: Secondary | ICD-10-CM | POA: Diagnosis not present

## 2023-09-29 DIAGNOSIS — G4730 Sleep apnea, unspecified: Secondary | ICD-10-CM | POA: Diagnosis not present

## 2023-09-29 DIAGNOSIS — Z191 Hormone sensitive malignancy status: Secondary | ICD-10-CM | POA: Diagnosis not present

## 2023-09-29 DIAGNOSIS — C61 Malignant neoplasm of prostate: Secondary | ICD-10-CM | POA: Diagnosis not present

## 2023-09-29 LAB — RAD ONC ARIA SESSION SUMMARY
Course Elapsed Days: 12
Plan Fractions Treated to Date: 9
Plan Prescribed Dose Per Fraction: 2.5 Gy
Plan Total Fractions Prescribed: 28
Plan Total Prescribed Dose: 70 Gy
Reference Point Dosage Given to Date: 22.5 Gy
Reference Point Session Dosage Given: 2.5 Gy
Session Number: 9

## 2023-09-30 ENCOUNTER — Ambulatory Visit
Admission: RE | Admit: 2023-09-30 | Discharge: 2023-09-30 | Disposition: A | Discharge status: Home / Self Care | Payer: Medicare HMO | Source: Ambulatory Visit | Attending: Radiation Oncology | Admitting: Radiation Oncology

## 2023-09-30 ENCOUNTER — Other Ambulatory Visit: Payer: Self-pay

## 2023-09-30 DIAGNOSIS — Z191 Hormone sensitive malignancy status: Secondary | ICD-10-CM | POA: Diagnosis not present

## 2023-09-30 DIAGNOSIS — G4730 Sleep apnea, unspecified: Secondary | ICD-10-CM | POA: Diagnosis not present

## 2023-09-30 DIAGNOSIS — G2581 Restless legs syndrome: Secondary | ICD-10-CM | POA: Diagnosis not present

## 2023-09-30 DIAGNOSIS — I10 Essential (primary) hypertension: Secondary | ICD-10-CM | POA: Diagnosis not present

## 2023-09-30 DIAGNOSIS — J432 Centrilobular emphysema: Secondary | ICD-10-CM | POA: Diagnosis not present

## 2023-09-30 DIAGNOSIS — K219 Gastro-esophageal reflux disease without esophagitis: Secondary | ICD-10-CM | POA: Diagnosis not present

## 2023-09-30 DIAGNOSIS — E785 Hyperlipidemia, unspecified: Secondary | ICD-10-CM | POA: Diagnosis not present

## 2023-09-30 DIAGNOSIS — I2510 Atherosclerotic heart disease of native coronary artery without angina pectoris: Secondary | ICD-10-CM | POA: Diagnosis not present

## 2023-09-30 DIAGNOSIS — Z510 Encounter for antineoplastic radiation therapy: Secondary | ICD-10-CM | POA: Diagnosis not present

## 2023-09-30 DIAGNOSIS — C61 Malignant neoplasm of prostate: Secondary | ICD-10-CM | POA: Diagnosis not present

## 2023-09-30 DIAGNOSIS — H409 Unspecified glaucoma: Secondary | ICD-10-CM | POA: Diagnosis not present

## 2023-09-30 LAB — RAD ONC ARIA SESSION SUMMARY
Course Elapsed Days: 13
Plan Fractions Treated to Date: 10
Plan Prescribed Dose Per Fraction: 2.5 Gy
Plan Total Fractions Prescribed: 28
Plan Total Prescribed Dose: 70 Gy
Reference Point Dosage Given to Date: 25 Gy
Reference Point Session Dosage Given: 2.5 Gy
Session Number: 10

## 2023-10-02 ENCOUNTER — Ambulatory Visit: Payer: Medicare HMO

## 2023-10-02 ENCOUNTER — Other Ambulatory Visit: Payer: Self-pay

## 2023-10-02 ENCOUNTER — Ambulatory Visit
Admission: RE | Admit: 2023-10-02 | Discharge: 2023-10-02 | Disposition: A | Discharge status: Home / Self Care | Payer: Medicare HMO | Source: Ambulatory Visit | Attending: Radiation Oncology | Admitting: Radiation Oncology

## 2023-10-02 DIAGNOSIS — J432 Centrilobular emphysema: Secondary | ICD-10-CM | POA: Diagnosis not present

## 2023-10-02 DIAGNOSIS — Z191 Hormone sensitive malignancy status: Secondary | ICD-10-CM | POA: Diagnosis not present

## 2023-10-02 DIAGNOSIS — E785 Hyperlipidemia, unspecified: Secondary | ICD-10-CM | POA: Diagnosis not present

## 2023-10-02 DIAGNOSIS — I10 Essential (primary) hypertension: Secondary | ICD-10-CM | POA: Diagnosis not present

## 2023-10-02 DIAGNOSIS — G4730 Sleep apnea, unspecified: Secondary | ICD-10-CM | POA: Diagnosis not present

## 2023-10-02 DIAGNOSIS — I2510 Atherosclerotic heart disease of native coronary artery without angina pectoris: Secondary | ICD-10-CM | POA: Diagnosis not present

## 2023-10-02 DIAGNOSIS — G2581 Restless legs syndrome: Secondary | ICD-10-CM | POA: Diagnosis not present

## 2023-10-02 DIAGNOSIS — C61 Malignant neoplasm of prostate: Secondary | ICD-10-CM | POA: Diagnosis not present

## 2023-10-02 DIAGNOSIS — H409 Unspecified glaucoma: Secondary | ICD-10-CM | POA: Diagnosis not present

## 2023-10-02 DIAGNOSIS — Z510 Encounter for antineoplastic radiation therapy: Secondary | ICD-10-CM | POA: Diagnosis not present

## 2023-10-02 DIAGNOSIS — K219 Gastro-esophageal reflux disease without esophagitis: Secondary | ICD-10-CM | POA: Diagnosis not present

## 2023-10-02 LAB — RAD ONC ARIA SESSION SUMMARY
Course Elapsed Days: 15
Plan Fractions Treated to Date: 11
Plan Prescribed Dose Per Fraction: 2.5 Gy
Plan Total Fractions Prescribed: 28
Plan Total Prescribed Dose: 70 Gy
Reference Point Dosage Given to Date: 27.5 Gy
Reference Point Session Dosage Given: 2.5 Gy
Session Number: 11

## 2023-10-03 ENCOUNTER — Ambulatory Visit: Payer: Medicare HMO

## 2023-10-03 ENCOUNTER — Other Ambulatory Visit: Payer: Self-pay

## 2023-10-03 ENCOUNTER — Ambulatory Visit
Admission: RE | Admit: 2023-10-03 | Discharge: 2023-10-03 | Disposition: A | Discharge status: Home / Self Care | Payer: Medicare HMO | Source: Ambulatory Visit | Attending: Radiation Oncology | Admitting: Radiation Oncology

## 2023-10-03 DIAGNOSIS — H409 Unspecified glaucoma: Secondary | ICD-10-CM | POA: Diagnosis not present

## 2023-10-03 DIAGNOSIS — I10 Essential (primary) hypertension: Secondary | ICD-10-CM | POA: Diagnosis not present

## 2023-10-03 DIAGNOSIS — I2510 Atherosclerotic heart disease of native coronary artery without angina pectoris: Secondary | ICD-10-CM | POA: Diagnosis not present

## 2023-10-03 DIAGNOSIS — Z191 Hormone sensitive malignancy status: Secondary | ICD-10-CM | POA: Diagnosis not present

## 2023-10-03 DIAGNOSIS — Z510 Encounter for antineoplastic radiation therapy: Secondary | ICD-10-CM | POA: Diagnosis not present

## 2023-10-03 DIAGNOSIS — C61 Malignant neoplasm of prostate: Secondary | ICD-10-CM | POA: Diagnosis not present

## 2023-10-03 DIAGNOSIS — G2581 Restless legs syndrome: Secondary | ICD-10-CM | POA: Diagnosis not present

## 2023-10-03 DIAGNOSIS — G4730 Sleep apnea, unspecified: Secondary | ICD-10-CM | POA: Diagnosis not present

## 2023-10-03 DIAGNOSIS — E785 Hyperlipidemia, unspecified: Secondary | ICD-10-CM | POA: Diagnosis not present

## 2023-10-03 DIAGNOSIS — J432 Centrilobular emphysema: Secondary | ICD-10-CM | POA: Diagnosis not present

## 2023-10-03 DIAGNOSIS — K219 Gastro-esophageal reflux disease without esophagitis: Secondary | ICD-10-CM | POA: Diagnosis not present

## 2023-10-03 LAB — RAD ONC ARIA SESSION SUMMARY
Course Elapsed Days: 16
Plan Fractions Treated to Date: 12
Plan Prescribed Dose Per Fraction: 2.5 Gy
Plan Total Fractions Prescribed: 28
Plan Total Prescribed Dose: 70 Gy
Reference Point Dosage Given to Date: 30 Gy
Reference Point Session Dosage Given: 2.5 Gy
Session Number: 12

## 2023-10-06 ENCOUNTER — Other Ambulatory Visit: Payer: Self-pay

## 2023-10-06 ENCOUNTER — Ambulatory Visit: Payer: Medicare HMO

## 2023-10-06 ENCOUNTER — Ambulatory Visit
Admission: RE | Admit: 2023-10-06 | Discharge: 2023-10-06 | Disposition: A | Discharge status: Home / Self Care | Payer: Medicare HMO | Source: Ambulatory Visit | Attending: Radiation Oncology | Admitting: Radiation Oncology

## 2023-10-06 DIAGNOSIS — Z191 Hormone sensitive malignancy status: Secondary | ICD-10-CM | POA: Diagnosis not present

## 2023-10-06 DIAGNOSIS — G4730 Sleep apnea, unspecified: Secondary | ICD-10-CM | POA: Diagnosis not present

## 2023-10-06 DIAGNOSIS — G2581 Restless legs syndrome: Secondary | ICD-10-CM | POA: Diagnosis not present

## 2023-10-06 DIAGNOSIS — Z510 Encounter for antineoplastic radiation therapy: Secondary | ICD-10-CM | POA: Diagnosis not present

## 2023-10-06 DIAGNOSIS — C61 Malignant neoplasm of prostate: Secondary | ICD-10-CM | POA: Diagnosis not present

## 2023-10-06 DIAGNOSIS — H409 Unspecified glaucoma: Secondary | ICD-10-CM | POA: Diagnosis not present

## 2023-10-06 DIAGNOSIS — K219 Gastro-esophageal reflux disease without esophagitis: Secondary | ICD-10-CM | POA: Diagnosis not present

## 2023-10-06 DIAGNOSIS — J432 Centrilobular emphysema: Secondary | ICD-10-CM | POA: Diagnosis not present

## 2023-10-06 DIAGNOSIS — I2510 Atherosclerotic heart disease of native coronary artery without angina pectoris: Secondary | ICD-10-CM | POA: Diagnosis not present

## 2023-10-06 DIAGNOSIS — E785 Hyperlipidemia, unspecified: Secondary | ICD-10-CM | POA: Diagnosis not present

## 2023-10-06 DIAGNOSIS — I10 Essential (primary) hypertension: Secondary | ICD-10-CM | POA: Diagnosis not present

## 2023-10-06 LAB — RAD ONC ARIA SESSION SUMMARY
Course Elapsed Days: 19
Plan Fractions Treated to Date: 13
Plan Prescribed Dose Per Fraction: 2.5 Gy
Plan Total Fractions Prescribed: 28
Plan Total Prescribed Dose: 70 Gy
Reference Point Dosage Given to Date: 32.5 Gy
Reference Point Session Dosage Given: 2.5 Gy
Session Number: 13

## 2023-10-07 ENCOUNTER — Other Ambulatory Visit: Payer: Self-pay

## 2023-10-07 ENCOUNTER — Ambulatory Visit: Payer: Medicare HMO

## 2023-10-07 ENCOUNTER — Ambulatory Visit
Admission: RE | Admit: 2023-10-07 | Discharge: 2023-10-07 | Disposition: A | Discharge status: Home / Self Care | Payer: Medicare HMO | Source: Ambulatory Visit | Attending: Radiation Oncology | Admitting: Radiation Oncology

## 2023-10-07 DIAGNOSIS — E785 Hyperlipidemia, unspecified: Secondary | ICD-10-CM | POA: Diagnosis not present

## 2023-10-07 DIAGNOSIS — I10 Essential (primary) hypertension: Secondary | ICD-10-CM | POA: Diagnosis not present

## 2023-10-07 DIAGNOSIS — Z191 Hormone sensitive malignancy status: Secondary | ICD-10-CM | POA: Diagnosis not present

## 2023-10-07 DIAGNOSIS — Z510 Encounter for antineoplastic radiation therapy: Secondary | ICD-10-CM | POA: Diagnosis not present

## 2023-10-07 DIAGNOSIS — K219 Gastro-esophageal reflux disease without esophagitis: Secondary | ICD-10-CM | POA: Diagnosis not present

## 2023-10-07 DIAGNOSIS — C61 Malignant neoplasm of prostate: Secondary | ICD-10-CM | POA: Diagnosis not present

## 2023-10-07 DIAGNOSIS — G4730 Sleep apnea, unspecified: Secondary | ICD-10-CM | POA: Diagnosis not present

## 2023-10-07 DIAGNOSIS — I2510 Atherosclerotic heart disease of native coronary artery without angina pectoris: Secondary | ICD-10-CM | POA: Diagnosis not present

## 2023-10-07 DIAGNOSIS — J432 Centrilobular emphysema: Secondary | ICD-10-CM | POA: Diagnosis not present

## 2023-10-07 DIAGNOSIS — H409 Unspecified glaucoma: Secondary | ICD-10-CM | POA: Diagnosis not present

## 2023-10-07 DIAGNOSIS — G2581 Restless legs syndrome: Secondary | ICD-10-CM | POA: Diagnosis not present

## 2023-10-07 LAB — RAD ONC ARIA SESSION SUMMARY
Course Elapsed Days: 20
Plan Fractions Treated to Date: 14
Plan Prescribed Dose Per Fraction: 2.5 Gy
Plan Total Fractions Prescribed: 28
Plan Total Prescribed Dose: 70 Gy
Reference Point Dosage Given to Date: 35 Gy
Reference Point Session Dosage Given: 2.5 Gy
Session Number: 14

## 2023-10-09 ENCOUNTER — Ambulatory Visit: Payer: Medicare HMO

## 2023-10-09 ENCOUNTER — Other Ambulatory Visit: Payer: Self-pay

## 2023-10-09 ENCOUNTER — Inpatient Hospital Stay: Payer: Medicare HMO

## 2023-10-09 ENCOUNTER — Ambulatory Visit
Admission: RE | Admit: 2023-10-09 | Discharge: 2023-10-09 | Disposition: A | Discharge status: Home / Self Care | Payer: Medicare HMO | Source: Ambulatory Visit | Attending: Radiation Oncology | Admitting: Radiation Oncology

## 2023-10-09 DIAGNOSIS — Z7982 Long term (current) use of aspirin: Secondary | ICD-10-CM | POA: Insufficient documentation

## 2023-10-09 DIAGNOSIS — I252 Old myocardial infarction: Secondary | ICD-10-CM | POA: Diagnosis not present

## 2023-10-09 DIAGNOSIS — I2510 Atherosclerotic heart disease of native coronary artery without angina pectoris: Secondary | ICD-10-CM | POA: Diagnosis not present

## 2023-10-09 DIAGNOSIS — J432 Centrilobular emphysema: Secondary | ICD-10-CM | POA: Diagnosis not present

## 2023-10-09 DIAGNOSIS — G4730 Sleep apnea, unspecified: Secondary | ICD-10-CM | POA: Diagnosis not present

## 2023-10-09 DIAGNOSIS — E785 Hyperlipidemia, unspecified: Secondary | ICD-10-CM | POA: Diagnosis not present

## 2023-10-09 DIAGNOSIS — K219 Gastro-esophageal reflux disease without esophagitis: Secondary | ICD-10-CM | POA: Insufficient documentation

## 2023-10-09 DIAGNOSIS — Z7902 Long term (current) use of antithrombotics/antiplatelets: Secondary | ICD-10-CM | POA: Insufficient documentation

## 2023-10-09 DIAGNOSIS — C61 Malignant neoplasm of prostate: Secondary | ICD-10-CM | POA: Insufficient documentation

## 2023-10-09 DIAGNOSIS — Z79899 Other long term (current) drug therapy: Secondary | ICD-10-CM | POA: Insufficient documentation

## 2023-10-09 DIAGNOSIS — Z87891 Personal history of nicotine dependence: Secondary | ICD-10-CM | POA: Diagnosis not present

## 2023-10-09 DIAGNOSIS — Z993 Dependence on wheelchair: Secondary | ICD-10-CM | POA: Diagnosis not present

## 2023-10-09 DIAGNOSIS — Z7951 Long term (current) use of inhaled steroids: Secondary | ICD-10-CM | POA: Diagnosis not present

## 2023-10-09 DIAGNOSIS — Z191 Hormone sensitive malignancy status: Secondary | ICD-10-CM | POA: Diagnosis not present

## 2023-10-09 DIAGNOSIS — G2581 Restless legs syndrome: Secondary | ICD-10-CM | POA: Diagnosis not present

## 2023-10-09 DIAGNOSIS — I10 Essential (primary) hypertension: Secondary | ICD-10-CM | POA: Diagnosis not present

## 2023-10-09 DIAGNOSIS — Z9981 Dependence on supplemental oxygen: Secondary | ICD-10-CM | POA: Insufficient documentation

## 2023-10-09 DIAGNOSIS — Z510 Encounter for antineoplastic radiation therapy: Secondary | ICD-10-CM | POA: Diagnosis not present

## 2023-10-09 DIAGNOSIS — H409 Unspecified glaucoma: Secondary | ICD-10-CM | POA: Insufficient documentation

## 2023-10-09 LAB — CBC (CANCER CENTER ONLY)
HCT: 41.1 % (ref 39.0–52.0)
Hemoglobin: 13.6 g/dL (ref 13.0–17.0)
MCH: 30.6 pg (ref 26.0–34.0)
MCHC: 33.1 g/dL (ref 30.0–36.0)
MCV: 92.4 fL (ref 80.0–100.0)
Platelet Count: 160 10*3/uL (ref 150–400)
RBC: 4.45 MIL/uL (ref 4.22–5.81)
RDW: 12.9 % (ref 11.5–15.5)
WBC Count: 8 10*3/uL (ref 4.0–10.5)
nRBC: 0 % (ref 0.0–0.2)

## 2023-10-09 LAB — RAD ONC ARIA SESSION SUMMARY
Course Elapsed Days: 22
Plan Fractions Treated to Date: 15
Plan Prescribed Dose Per Fraction: 2.5 Gy
Plan Total Fractions Prescribed: 28
Plan Total Prescribed Dose: 70 Gy
Reference Point Dosage Given to Date: 37.5 Gy
Reference Point Session Dosage Given: 2.5 Gy
Session Number: 15

## 2023-10-10 ENCOUNTER — Other Ambulatory Visit: Payer: Self-pay

## 2023-10-10 ENCOUNTER — Ambulatory Visit
Admission: RE | Admit: 2023-10-10 | Discharge: 2023-10-10 | Disposition: A | Discharge status: Home / Self Care | Payer: Medicare HMO | Source: Ambulatory Visit | Attending: Radiation Oncology | Admitting: Radiation Oncology

## 2023-10-10 ENCOUNTER — Ambulatory Visit: Payer: Medicare HMO

## 2023-10-10 DIAGNOSIS — Z510 Encounter for antineoplastic radiation therapy: Secondary | ICD-10-CM | POA: Diagnosis not present

## 2023-10-10 DIAGNOSIS — K219 Gastro-esophageal reflux disease without esophagitis: Secondary | ICD-10-CM | POA: Diagnosis not present

## 2023-10-10 DIAGNOSIS — C61 Malignant neoplasm of prostate: Secondary | ICD-10-CM | POA: Diagnosis not present

## 2023-10-10 DIAGNOSIS — E785 Hyperlipidemia, unspecified: Secondary | ICD-10-CM | POA: Diagnosis not present

## 2023-10-10 DIAGNOSIS — G4730 Sleep apnea, unspecified: Secondary | ICD-10-CM | POA: Diagnosis not present

## 2023-10-10 DIAGNOSIS — G2581 Restless legs syndrome: Secondary | ICD-10-CM | POA: Diagnosis not present

## 2023-10-10 DIAGNOSIS — Z191 Hormone sensitive malignancy status: Secondary | ICD-10-CM | POA: Diagnosis not present

## 2023-10-10 DIAGNOSIS — J432 Centrilobular emphysema: Secondary | ICD-10-CM | POA: Diagnosis not present

## 2023-10-10 DIAGNOSIS — H409 Unspecified glaucoma: Secondary | ICD-10-CM | POA: Diagnosis not present

## 2023-10-10 DIAGNOSIS — I2510 Atherosclerotic heart disease of native coronary artery without angina pectoris: Secondary | ICD-10-CM | POA: Diagnosis not present

## 2023-10-10 DIAGNOSIS — I10 Essential (primary) hypertension: Secondary | ICD-10-CM | POA: Diagnosis not present

## 2023-10-10 LAB — RAD ONC ARIA SESSION SUMMARY
Course Elapsed Days: 23
Plan Fractions Treated to Date: 16
Plan Prescribed Dose Per Fraction: 2.5 Gy
Plan Total Fractions Prescribed: 28
Plan Total Prescribed Dose: 70 Gy
Reference Point Dosage Given to Date: 40 Gy
Reference Point Session Dosage Given: 2.5 Gy
Session Number: 16

## 2023-10-13 ENCOUNTER — Ambulatory Visit: Payer: Medicare HMO

## 2023-10-14 ENCOUNTER — Other Ambulatory Visit: Payer: Self-pay

## 2023-10-14 ENCOUNTER — Ambulatory Visit
Admission: RE | Admit: 2023-10-14 | Discharge: 2023-10-14 | Disposition: A | Discharge status: Home / Self Care | Payer: Medicare HMO | Source: Ambulatory Visit | Attending: Radiation Oncology | Admitting: Radiation Oncology

## 2023-10-14 ENCOUNTER — Ambulatory Visit: Payer: Medicare HMO

## 2023-10-14 DIAGNOSIS — K219 Gastro-esophageal reflux disease without esophagitis: Secondary | ICD-10-CM | POA: Diagnosis not present

## 2023-10-14 DIAGNOSIS — H409 Unspecified glaucoma: Secondary | ICD-10-CM | POA: Diagnosis not present

## 2023-10-14 DIAGNOSIS — C61 Malignant neoplasm of prostate: Secondary | ICD-10-CM | POA: Diagnosis not present

## 2023-10-14 DIAGNOSIS — Z510 Encounter for antineoplastic radiation therapy: Secondary | ICD-10-CM | POA: Diagnosis not present

## 2023-10-14 DIAGNOSIS — Z191 Hormone sensitive malignancy status: Secondary | ICD-10-CM | POA: Diagnosis not present

## 2023-10-14 DIAGNOSIS — G4730 Sleep apnea, unspecified: Secondary | ICD-10-CM | POA: Diagnosis not present

## 2023-10-14 DIAGNOSIS — I2510 Atherosclerotic heart disease of native coronary artery without angina pectoris: Secondary | ICD-10-CM | POA: Diagnosis not present

## 2023-10-14 DIAGNOSIS — G2581 Restless legs syndrome: Secondary | ICD-10-CM | POA: Diagnosis not present

## 2023-10-14 DIAGNOSIS — J432 Centrilobular emphysema: Secondary | ICD-10-CM | POA: Diagnosis not present

## 2023-10-14 DIAGNOSIS — E785 Hyperlipidemia, unspecified: Secondary | ICD-10-CM | POA: Diagnosis not present

## 2023-10-14 DIAGNOSIS — I10 Essential (primary) hypertension: Secondary | ICD-10-CM | POA: Diagnosis not present

## 2023-10-14 LAB — RAD ONC ARIA SESSION SUMMARY
Course Elapsed Days: 27
Plan Fractions Treated to Date: 17
Plan Prescribed Dose Per Fraction: 2.5 Gy
Plan Total Fractions Prescribed: 28
Plan Total Prescribed Dose: 70 Gy
Reference Point Dosage Given to Date: 42.5 Gy
Reference Point Session Dosage Given: 2.5 Gy
Session Number: 17

## 2023-10-15 ENCOUNTER — Ambulatory Visit
Admission: RE | Admit: 2023-10-15 | Discharge: 2023-10-15 | Disposition: A | Discharge status: Home / Self Care | Payer: Medicare HMO | Source: Ambulatory Visit | Attending: Radiation Oncology | Admitting: Radiation Oncology

## 2023-10-15 ENCOUNTER — Other Ambulatory Visit: Payer: Self-pay

## 2023-10-15 ENCOUNTER — Ambulatory Visit: Payer: Medicare HMO

## 2023-10-15 DIAGNOSIS — G2581 Restless legs syndrome: Secondary | ICD-10-CM | POA: Diagnosis not present

## 2023-10-15 DIAGNOSIS — G4730 Sleep apnea, unspecified: Secondary | ICD-10-CM | POA: Diagnosis not present

## 2023-10-15 DIAGNOSIS — J432 Centrilobular emphysema: Secondary | ICD-10-CM | POA: Diagnosis not present

## 2023-10-15 DIAGNOSIS — I10 Essential (primary) hypertension: Secondary | ICD-10-CM | POA: Diagnosis not present

## 2023-10-15 DIAGNOSIS — K219 Gastro-esophageal reflux disease without esophagitis: Secondary | ICD-10-CM | POA: Diagnosis not present

## 2023-10-15 DIAGNOSIS — Z191 Hormone sensitive malignancy status: Secondary | ICD-10-CM | POA: Diagnosis not present

## 2023-10-15 DIAGNOSIS — I2510 Atherosclerotic heart disease of native coronary artery without angina pectoris: Secondary | ICD-10-CM | POA: Diagnosis not present

## 2023-10-15 DIAGNOSIS — H409 Unspecified glaucoma: Secondary | ICD-10-CM | POA: Diagnosis not present

## 2023-10-15 DIAGNOSIS — E785 Hyperlipidemia, unspecified: Secondary | ICD-10-CM | POA: Diagnosis not present

## 2023-10-15 DIAGNOSIS — C61 Malignant neoplasm of prostate: Secondary | ICD-10-CM | POA: Diagnosis not present

## 2023-10-15 DIAGNOSIS — Z510 Encounter for antineoplastic radiation therapy: Secondary | ICD-10-CM | POA: Diagnosis not present

## 2023-10-15 LAB — RAD ONC ARIA SESSION SUMMARY
Course Elapsed Days: 28
Plan Fractions Treated to Date: 18
Plan Prescribed Dose Per Fraction: 2.5 Gy
Plan Total Fractions Prescribed: 28
Plan Total Prescribed Dose: 70 Gy
Reference Point Dosage Given to Date: 45 Gy
Reference Point Session Dosage Given: 2.5 Gy
Session Number: 18

## 2023-10-16 ENCOUNTER — Ambulatory Visit: Payer: Medicare HMO

## 2023-10-16 ENCOUNTER — Other Ambulatory Visit: Payer: Self-pay

## 2023-10-16 ENCOUNTER — Ambulatory Visit
Admission: RE | Admit: 2023-10-16 | Discharge: 2023-10-16 | Disposition: A | Discharge status: Home / Self Care | Payer: Medicare HMO | Source: Ambulatory Visit | Attending: Radiation Oncology | Admitting: Radiation Oncology

## 2023-10-16 DIAGNOSIS — K219 Gastro-esophageal reflux disease without esophagitis: Secondary | ICD-10-CM | POA: Diagnosis not present

## 2023-10-16 DIAGNOSIS — E785 Hyperlipidemia, unspecified: Secondary | ICD-10-CM | POA: Diagnosis not present

## 2023-10-16 DIAGNOSIS — H409 Unspecified glaucoma: Secondary | ICD-10-CM | POA: Diagnosis not present

## 2023-10-16 DIAGNOSIS — Z191 Hormone sensitive malignancy status: Secondary | ICD-10-CM | POA: Diagnosis not present

## 2023-10-16 DIAGNOSIS — C61 Malignant neoplasm of prostate: Secondary | ICD-10-CM | POA: Diagnosis not present

## 2023-10-16 DIAGNOSIS — J432 Centrilobular emphysema: Secondary | ICD-10-CM | POA: Diagnosis not present

## 2023-10-16 DIAGNOSIS — Z510 Encounter for antineoplastic radiation therapy: Secondary | ICD-10-CM | POA: Diagnosis not present

## 2023-10-16 DIAGNOSIS — G2581 Restless legs syndrome: Secondary | ICD-10-CM | POA: Diagnosis not present

## 2023-10-16 DIAGNOSIS — I2510 Atherosclerotic heart disease of native coronary artery without angina pectoris: Secondary | ICD-10-CM | POA: Diagnosis not present

## 2023-10-16 DIAGNOSIS — I10 Essential (primary) hypertension: Secondary | ICD-10-CM | POA: Diagnosis not present

## 2023-10-16 DIAGNOSIS — G4730 Sleep apnea, unspecified: Secondary | ICD-10-CM | POA: Diagnosis not present

## 2023-10-16 LAB — RAD ONC ARIA SESSION SUMMARY
Course Elapsed Days: 29
Plan Fractions Treated to Date: 19
Plan Prescribed Dose Per Fraction: 2.5 Gy
Plan Total Fractions Prescribed: 28
Plan Total Prescribed Dose: 70 Gy
Reference Point Dosage Given to Date: 47.5 Gy
Reference Point Session Dosage Given: 2.5 Gy
Session Number: 19

## 2023-10-17 ENCOUNTER — Other Ambulatory Visit: Payer: Self-pay

## 2023-10-17 ENCOUNTER — Ambulatory Visit
Admission: RE | Admit: 2023-10-17 | Discharge: 2023-10-17 | Disposition: A | Discharge status: Home / Self Care | Payer: Medicare HMO | Source: Ambulatory Visit | Attending: Radiation Oncology | Admitting: Radiation Oncology

## 2023-10-17 ENCOUNTER — Ambulatory Visit: Payer: Medicare HMO

## 2023-10-17 DIAGNOSIS — Z191 Hormone sensitive malignancy status: Secondary | ICD-10-CM | POA: Diagnosis not present

## 2023-10-17 DIAGNOSIS — Z510 Encounter for antineoplastic radiation therapy: Secondary | ICD-10-CM | POA: Diagnosis not present

## 2023-10-17 DIAGNOSIS — E785 Hyperlipidemia, unspecified: Secondary | ICD-10-CM | POA: Diagnosis not present

## 2023-10-17 DIAGNOSIS — H409 Unspecified glaucoma: Secondary | ICD-10-CM | POA: Diagnosis not present

## 2023-10-17 DIAGNOSIS — G2581 Restless legs syndrome: Secondary | ICD-10-CM | POA: Diagnosis not present

## 2023-10-17 DIAGNOSIS — I10 Essential (primary) hypertension: Secondary | ICD-10-CM | POA: Diagnosis not present

## 2023-10-17 DIAGNOSIS — J432 Centrilobular emphysema: Secondary | ICD-10-CM | POA: Diagnosis not present

## 2023-10-17 DIAGNOSIS — I2510 Atherosclerotic heart disease of native coronary artery without angina pectoris: Secondary | ICD-10-CM | POA: Diagnosis not present

## 2023-10-17 DIAGNOSIS — G4730 Sleep apnea, unspecified: Secondary | ICD-10-CM | POA: Diagnosis not present

## 2023-10-17 DIAGNOSIS — K219 Gastro-esophageal reflux disease without esophagitis: Secondary | ICD-10-CM | POA: Diagnosis not present

## 2023-10-17 DIAGNOSIS — C61 Malignant neoplasm of prostate: Secondary | ICD-10-CM | POA: Diagnosis not present

## 2023-10-17 LAB — RAD ONC ARIA SESSION SUMMARY
Course Elapsed Days: 30
Plan Fractions Treated to Date: 20
Plan Prescribed Dose Per Fraction: 2.5 Gy
Plan Total Fractions Prescribed: 28
Plan Total Prescribed Dose: 70 Gy
Reference Point Dosage Given to Date: 50 Gy
Reference Point Session Dosage Given: 2.5 Gy
Session Number: 20

## 2023-10-20 ENCOUNTER — Ambulatory Visit: Payer: Medicare HMO

## 2023-10-21 ENCOUNTER — Ambulatory Visit: Payer: Medicare HMO

## 2023-10-21 ENCOUNTER — Ambulatory Visit
Admission: RE | Admit: 2023-10-21 | Discharge: 2023-10-21 | Disposition: A | Discharge status: Home / Self Care | Payer: Medicare HMO | Source: Ambulatory Visit | Attending: Radiation Oncology | Admitting: Radiation Oncology

## 2023-10-21 ENCOUNTER — Other Ambulatory Visit: Payer: Self-pay

## 2023-10-21 DIAGNOSIS — G2581 Restless legs syndrome: Secondary | ICD-10-CM | POA: Diagnosis not present

## 2023-10-21 DIAGNOSIS — C61 Malignant neoplasm of prostate: Secondary | ICD-10-CM | POA: Diagnosis not present

## 2023-10-21 DIAGNOSIS — Z510 Encounter for antineoplastic radiation therapy: Secondary | ICD-10-CM | POA: Diagnosis not present

## 2023-10-21 DIAGNOSIS — I10 Essential (primary) hypertension: Secondary | ICD-10-CM | POA: Diagnosis not present

## 2023-10-21 DIAGNOSIS — I2510 Atherosclerotic heart disease of native coronary artery without angina pectoris: Secondary | ICD-10-CM | POA: Diagnosis not present

## 2023-10-21 DIAGNOSIS — Z191 Hormone sensitive malignancy status: Secondary | ICD-10-CM | POA: Diagnosis not present

## 2023-10-21 DIAGNOSIS — E785 Hyperlipidemia, unspecified: Secondary | ICD-10-CM | POA: Diagnosis not present

## 2023-10-21 DIAGNOSIS — J432 Centrilobular emphysema: Secondary | ICD-10-CM | POA: Diagnosis not present

## 2023-10-21 DIAGNOSIS — H409 Unspecified glaucoma: Secondary | ICD-10-CM | POA: Diagnosis not present

## 2023-10-21 DIAGNOSIS — G4730 Sleep apnea, unspecified: Secondary | ICD-10-CM | POA: Diagnosis not present

## 2023-10-21 DIAGNOSIS — K219 Gastro-esophageal reflux disease without esophagitis: Secondary | ICD-10-CM | POA: Diagnosis not present

## 2023-10-21 LAB — RAD ONC ARIA SESSION SUMMARY
Course Elapsed Days: 34
Plan Fractions Treated to Date: 21
Plan Prescribed Dose Per Fraction: 2.5 Gy
Plan Total Fractions Prescribed: 28
Plan Total Prescribed Dose: 70 Gy
Reference Point Dosage Given to Date: 52.5 Gy
Reference Point Session Dosage Given: 2.5 Gy
Session Number: 21

## 2023-10-22 ENCOUNTER — Ambulatory Visit: Payer: Medicare HMO

## 2023-10-22 ENCOUNTER — Inpatient Hospital Stay: Payer: Medicare HMO

## 2023-10-22 ENCOUNTER — Other Ambulatory Visit: Payer: Self-pay

## 2023-10-22 ENCOUNTER — Ambulatory Visit
Admission: RE | Admit: 2023-10-22 | Discharge: 2023-10-22 | Disposition: A | Discharge status: Home / Self Care | Payer: Medicare HMO | Source: Ambulatory Visit | Attending: Radiation Oncology | Admitting: Radiation Oncology

## 2023-10-22 DIAGNOSIS — I10 Essential (primary) hypertension: Secondary | ICD-10-CM | POA: Diagnosis not present

## 2023-10-22 DIAGNOSIS — Z191 Hormone sensitive malignancy status: Secondary | ICD-10-CM | POA: Diagnosis not present

## 2023-10-22 DIAGNOSIS — C61 Malignant neoplasm of prostate: Secondary | ICD-10-CM | POA: Diagnosis not present

## 2023-10-22 DIAGNOSIS — G2581 Restless legs syndrome: Secondary | ICD-10-CM | POA: Diagnosis not present

## 2023-10-22 DIAGNOSIS — J432 Centrilobular emphysema: Secondary | ICD-10-CM | POA: Diagnosis not present

## 2023-10-22 DIAGNOSIS — Z510 Encounter for antineoplastic radiation therapy: Secondary | ICD-10-CM | POA: Diagnosis not present

## 2023-10-22 DIAGNOSIS — H409 Unspecified glaucoma: Secondary | ICD-10-CM | POA: Diagnosis not present

## 2023-10-22 DIAGNOSIS — K219 Gastro-esophageal reflux disease without esophagitis: Secondary | ICD-10-CM | POA: Diagnosis not present

## 2023-10-22 DIAGNOSIS — G4730 Sleep apnea, unspecified: Secondary | ICD-10-CM | POA: Diagnosis not present

## 2023-10-22 DIAGNOSIS — E785 Hyperlipidemia, unspecified: Secondary | ICD-10-CM | POA: Diagnosis not present

## 2023-10-22 DIAGNOSIS — I2510 Atherosclerotic heart disease of native coronary artery without angina pectoris: Secondary | ICD-10-CM | POA: Diagnosis not present

## 2023-10-22 LAB — RAD ONC ARIA SESSION SUMMARY
Course Elapsed Days: 35
Plan Fractions Treated to Date: 22
Plan Prescribed Dose Per Fraction: 2.5 Gy
Plan Total Fractions Prescribed: 28
Plan Total Prescribed Dose: 70 Gy
Reference Point Dosage Given to Date: 55 Gy
Reference Point Session Dosage Given: 2.5 Gy
Session Number: 22

## 2023-10-23 ENCOUNTER — Other Ambulatory Visit: Payer: Self-pay

## 2023-10-23 ENCOUNTER — Ambulatory Visit
Admission: RE | Admit: 2023-10-23 | Discharge: 2023-10-23 | Disposition: A | Discharge status: Home / Self Care | Payer: Medicare HMO | Source: Ambulatory Visit | Attending: Radiation Oncology | Admitting: Radiation Oncology

## 2023-10-23 ENCOUNTER — Ambulatory Visit: Payer: Medicare HMO

## 2023-10-23 DIAGNOSIS — H409 Unspecified glaucoma: Secondary | ICD-10-CM | POA: Diagnosis not present

## 2023-10-23 DIAGNOSIS — I2510 Atherosclerotic heart disease of native coronary artery without angina pectoris: Secondary | ICD-10-CM | POA: Diagnosis not present

## 2023-10-23 DIAGNOSIS — C61 Malignant neoplasm of prostate: Secondary | ICD-10-CM | POA: Diagnosis not present

## 2023-10-23 DIAGNOSIS — G2581 Restless legs syndrome: Secondary | ICD-10-CM | POA: Diagnosis not present

## 2023-10-23 DIAGNOSIS — I10 Essential (primary) hypertension: Secondary | ICD-10-CM | POA: Diagnosis not present

## 2023-10-23 DIAGNOSIS — G4730 Sleep apnea, unspecified: Secondary | ICD-10-CM | POA: Diagnosis not present

## 2023-10-23 DIAGNOSIS — J432 Centrilobular emphysema: Secondary | ICD-10-CM | POA: Diagnosis not present

## 2023-10-23 DIAGNOSIS — E785 Hyperlipidemia, unspecified: Secondary | ICD-10-CM | POA: Diagnosis not present

## 2023-10-23 DIAGNOSIS — Z191 Hormone sensitive malignancy status: Secondary | ICD-10-CM | POA: Diagnosis not present

## 2023-10-23 DIAGNOSIS — K219 Gastro-esophageal reflux disease without esophagitis: Secondary | ICD-10-CM | POA: Diagnosis not present

## 2023-10-23 DIAGNOSIS — Z510 Encounter for antineoplastic radiation therapy: Secondary | ICD-10-CM | POA: Diagnosis not present

## 2023-10-23 LAB — RAD ONC ARIA SESSION SUMMARY
Course Elapsed Days: 36
Plan Fractions Treated to Date: 23
Plan Prescribed Dose Per Fraction: 2.5 Gy
Plan Total Fractions Prescribed: 28
Plan Total Prescribed Dose: 70 Gy
Reference Point Dosage Given to Date: 57.5 Gy
Reference Point Session Dosage Given: 2.5 Gy
Session Number: 23

## 2023-10-24 ENCOUNTER — Ambulatory Visit: Payer: Medicare HMO

## 2023-10-24 ENCOUNTER — Ambulatory Visit
Admission: RE | Admit: 2023-10-24 | Discharge: 2023-10-24 | Disposition: A | Discharge status: Home / Self Care | Payer: Medicare HMO | Source: Ambulatory Visit | Attending: Radiation Oncology | Admitting: Radiation Oncology

## 2023-10-24 ENCOUNTER — Other Ambulatory Visit: Payer: Self-pay

## 2023-10-24 DIAGNOSIS — I10 Essential (primary) hypertension: Secondary | ICD-10-CM | POA: Diagnosis not present

## 2023-10-24 DIAGNOSIS — H409 Unspecified glaucoma: Secondary | ICD-10-CM | POA: Diagnosis not present

## 2023-10-24 DIAGNOSIS — G4730 Sleep apnea, unspecified: Secondary | ICD-10-CM | POA: Diagnosis not present

## 2023-10-24 DIAGNOSIS — Z191 Hormone sensitive malignancy status: Secondary | ICD-10-CM | POA: Diagnosis not present

## 2023-10-24 DIAGNOSIS — E785 Hyperlipidemia, unspecified: Secondary | ICD-10-CM | POA: Diagnosis not present

## 2023-10-24 DIAGNOSIS — G2581 Restless legs syndrome: Secondary | ICD-10-CM | POA: Diagnosis not present

## 2023-10-24 DIAGNOSIS — K219 Gastro-esophageal reflux disease without esophagitis: Secondary | ICD-10-CM | POA: Diagnosis not present

## 2023-10-24 DIAGNOSIS — J432 Centrilobular emphysema: Secondary | ICD-10-CM | POA: Diagnosis not present

## 2023-10-24 DIAGNOSIS — Z510 Encounter for antineoplastic radiation therapy: Secondary | ICD-10-CM | POA: Diagnosis not present

## 2023-10-24 DIAGNOSIS — C61 Malignant neoplasm of prostate: Secondary | ICD-10-CM | POA: Diagnosis not present

## 2023-10-24 DIAGNOSIS — I2510 Atherosclerotic heart disease of native coronary artery without angina pectoris: Secondary | ICD-10-CM | POA: Diagnosis not present

## 2023-10-24 LAB — RAD ONC ARIA SESSION SUMMARY
Course Elapsed Days: 37
Plan Fractions Treated to Date: 24
Plan Prescribed Dose Per Fraction: 2.5 Gy
Plan Total Fractions Prescribed: 28
Plan Total Prescribed Dose: 70 Gy
Reference Point Dosage Given to Date: 60 Gy
Reference Point Session Dosage Given: 2.5 Gy
Session Number: 24

## 2023-10-27 ENCOUNTER — Ambulatory Visit
Admission: RE | Admit: 2023-10-27 | Discharge: 2023-10-27 | Disposition: A | Discharge status: Home / Self Care | Payer: Medicare HMO | Source: Ambulatory Visit | Attending: Radiation Oncology | Admitting: Radiation Oncology

## 2023-10-27 ENCOUNTER — Other Ambulatory Visit: Payer: Self-pay

## 2023-10-27 ENCOUNTER — Ambulatory Visit: Payer: Medicare HMO

## 2023-10-27 DIAGNOSIS — C61 Malignant neoplasm of prostate: Secondary | ICD-10-CM | POA: Diagnosis not present

## 2023-10-27 DIAGNOSIS — G2581 Restless legs syndrome: Secondary | ICD-10-CM | POA: Diagnosis not present

## 2023-10-27 DIAGNOSIS — E785 Hyperlipidemia, unspecified: Secondary | ICD-10-CM | POA: Diagnosis not present

## 2023-10-27 DIAGNOSIS — K219 Gastro-esophageal reflux disease without esophagitis: Secondary | ICD-10-CM | POA: Diagnosis not present

## 2023-10-27 DIAGNOSIS — Z510 Encounter for antineoplastic radiation therapy: Secondary | ICD-10-CM | POA: Diagnosis not present

## 2023-10-27 DIAGNOSIS — I10 Essential (primary) hypertension: Secondary | ICD-10-CM | POA: Diagnosis not present

## 2023-10-27 DIAGNOSIS — H409 Unspecified glaucoma: Secondary | ICD-10-CM | POA: Diagnosis not present

## 2023-10-27 DIAGNOSIS — J432 Centrilobular emphysema: Secondary | ICD-10-CM | POA: Diagnosis not present

## 2023-10-27 DIAGNOSIS — G4730 Sleep apnea, unspecified: Secondary | ICD-10-CM | POA: Diagnosis not present

## 2023-10-27 DIAGNOSIS — I2510 Atherosclerotic heart disease of native coronary artery without angina pectoris: Secondary | ICD-10-CM | POA: Diagnosis not present

## 2023-10-27 DIAGNOSIS — Z191 Hormone sensitive malignancy status: Secondary | ICD-10-CM | POA: Diagnosis not present

## 2023-10-27 LAB — RAD ONC ARIA SESSION SUMMARY
Course Elapsed Days: 40
Plan Fractions Treated to Date: 25
Plan Prescribed Dose Per Fraction: 2.5 Gy
Plan Total Fractions Prescribed: 28
Plan Total Prescribed Dose: 70 Gy
Reference Point Dosage Given to Date: 62.5 Gy
Reference Point Session Dosage Given: 2.5 Gy
Session Number: 25

## 2023-10-28 ENCOUNTER — Other Ambulatory Visit: Payer: Self-pay

## 2023-10-28 ENCOUNTER — Ambulatory Visit: Payer: Medicare HMO

## 2023-10-28 ENCOUNTER — Ambulatory Visit
Admission: RE | Admit: 2023-10-28 | Discharge: 2023-10-28 | Disposition: A | Discharge status: Home / Self Care | Payer: Medicare HMO | Source: Ambulatory Visit | Attending: Radiation Oncology | Admitting: Radiation Oncology

## 2023-10-28 DIAGNOSIS — I2510 Atherosclerotic heart disease of native coronary artery without angina pectoris: Secondary | ICD-10-CM | POA: Diagnosis not present

## 2023-10-28 DIAGNOSIS — E785 Hyperlipidemia, unspecified: Secondary | ICD-10-CM | POA: Diagnosis not present

## 2023-10-28 DIAGNOSIS — H409 Unspecified glaucoma: Secondary | ICD-10-CM | POA: Diagnosis not present

## 2023-10-28 DIAGNOSIS — G2581 Restless legs syndrome: Secondary | ICD-10-CM | POA: Diagnosis not present

## 2023-10-28 DIAGNOSIS — G4730 Sleep apnea, unspecified: Secondary | ICD-10-CM | POA: Diagnosis not present

## 2023-10-28 DIAGNOSIS — Z191 Hormone sensitive malignancy status: Secondary | ICD-10-CM | POA: Diagnosis not present

## 2023-10-28 DIAGNOSIS — K219 Gastro-esophageal reflux disease without esophagitis: Secondary | ICD-10-CM | POA: Diagnosis not present

## 2023-10-28 DIAGNOSIS — C61 Malignant neoplasm of prostate: Secondary | ICD-10-CM | POA: Diagnosis not present

## 2023-10-28 DIAGNOSIS — J432 Centrilobular emphysema: Secondary | ICD-10-CM | POA: Diagnosis not present

## 2023-10-28 DIAGNOSIS — Z510 Encounter for antineoplastic radiation therapy: Secondary | ICD-10-CM | POA: Diagnosis not present

## 2023-10-28 DIAGNOSIS — I10 Essential (primary) hypertension: Secondary | ICD-10-CM | POA: Diagnosis not present

## 2023-10-28 LAB — RAD ONC ARIA SESSION SUMMARY
Course Elapsed Days: 41
Plan Fractions Treated to Date: 26
Plan Prescribed Dose Per Fraction: 2.5 Gy
Plan Total Fractions Prescribed: 28
Plan Total Prescribed Dose: 70 Gy
Reference Point Dosage Given to Date: 65 Gy
Reference Point Session Dosage Given: 2.5 Gy
Session Number: 26

## 2023-10-29 ENCOUNTER — Ambulatory Visit: Payer: Medicare HMO

## 2023-10-30 ENCOUNTER — Ambulatory Visit: Payer: Medicare HMO

## 2023-10-30 ENCOUNTER — Ambulatory Visit
Admission: RE | Admit: 2023-10-30 | Discharge: 2023-10-30 | Disposition: A | Discharge status: Home / Self Care | Payer: Medicare HMO | Source: Ambulatory Visit | Attending: Radiation Oncology | Admitting: Radiation Oncology

## 2023-10-30 ENCOUNTER — Other Ambulatory Visit: Payer: Self-pay

## 2023-10-30 DIAGNOSIS — J432 Centrilobular emphysema: Secondary | ICD-10-CM | POA: Diagnosis not present

## 2023-10-30 DIAGNOSIS — C61 Malignant neoplasm of prostate: Secondary | ICD-10-CM | POA: Diagnosis not present

## 2023-10-30 DIAGNOSIS — Z191 Hormone sensitive malignancy status: Secondary | ICD-10-CM | POA: Diagnosis not present

## 2023-10-30 DIAGNOSIS — Z510 Encounter for antineoplastic radiation therapy: Secondary | ICD-10-CM | POA: Diagnosis not present

## 2023-10-30 DIAGNOSIS — G2581 Restless legs syndrome: Secondary | ICD-10-CM | POA: Diagnosis not present

## 2023-10-30 DIAGNOSIS — I10 Essential (primary) hypertension: Secondary | ICD-10-CM | POA: Diagnosis not present

## 2023-10-30 DIAGNOSIS — G4730 Sleep apnea, unspecified: Secondary | ICD-10-CM | POA: Diagnosis not present

## 2023-10-30 DIAGNOSIS — K219 Gastro-esophageal reflux disease without esophagitis: Secondary | ICD-10-CM | POA: Diagnosis not present

## 2023-10-30 DIAGNOSIS — E785 Hyperlipidemia, unspecified: Secondary | ICD-10-CM | POA: Diagnosis not present

## 2023-10-30 DIAGNOSIS — I2510 Atherosclerotic heart disease of native coronary artery without angina pectoris: Secondary | ICD-10-CM | POA: Diagnosis not present

## 2023-10-30 DIAGNOSIS — H409 Unspecified glaucoma: Secondary | ICD-10-CM | POA: Diagnosis not present

## 2023-10-30 LAB — RAD ONC ARIA SESSION SUMMARY
Course Elapsed Days: 43
Plan Fractions Treated to Date: 27
Plan Prescribed Dose Per Fraction: 2.5 Gy
Plan Total Fractions Prescribed: 28
Plan Total Prescribed Dose: 70 Gy
Reference Point Dosage Given to Date: 67.5 Gy
Reference Point Session Dosage Given: 2.5 Gy
Session Number: 27

## 2023-10-31 ENCOUNTER — Other Ambulatory Visit: Payer: Self-pay

## 2023-10-31 ENCOUNTER — Ambulatory Visit
Admission: RE | Admit: 2023-10-31 | Discharge: 2023-10-31 | Disposition: A | Discharge status: Home / Self Care | Payer: Medicare HMO | Source: Ambulatory Visit | Attending: Radiation Oncology | Admitting: Radiation Oncology

## 2023-10-31 ENCOUNTER — Ambulatory Visit: Payer: Medicare HMO

## 2023-10-31 DIAGNOSIS — G4730 Sleep apnea, unspecified: Secondary | ICD-10-CM | POA: Diagnosis not present

## 2023-10-31 DIAGNOSIS — I10 Essential (primary) hypertension: Secondary | ICD-10-CM | POA: Diagnosis not present

## 2023-10-31 DIAGNOSIS — Z510 Encounter for antineoplastic radiation therapy: Secondary | ICD-10-CM | POA: Diagnosis not present

## 2023-10-31 DIAGNOSIS — E785 Hyperlipidemia, unspecified: Secondary | ICD-10-CM | POA: Diagnosis not present

## 2023-10-31 DIAGNOSIS — I2510 Atherosclerotic heart disease of native coronary artery without angina pectoris: Secondary | ICD-10-CM | POA: Diagnosis not present

## 2023-10-31 DIAGNOSIS — Z191 Hormone sensitive malignancy status: Secondary | ICD-10-CM | POA: Diagnosis not present

## 2023-10-31 DIAGNOSIS — H409 Unspecified glaucoma: Secondary | ICD-10-CM | POA: Diagnosis not present

## 2023-10-31 DIAGNOSIS — G2581 Restless legs syndrome: Secondary | ICD-10-CM | POA: Diagnosis not present

## 2023-10-31 DIAGNOSIS — K219 Gastro-esophageal reflux disease without esophagitis: Secondary | ICD-10-CM | POA: Diagnosis not present

## 2023-10-31 DIAGNOSIS — C61 Malignant neoplasm of prostate: Secondary | ICD-10-CM | POA: Diagnosis not present

## 2023-10-31 DIAGNOSIS — J432 Centrilobular emphysema: Secondary | ICD-10-CM | POA: Diagnosis not present

## 2023-10-31 LAB — RAD ONC ARIA SESSION SUMMARY
Course Elapsed Days: 44
Plan Fractions Treated to Date: 28
Plan Prescribed Dose Per Fraction: 2.5 Gy
Plan Total Fractions Prescribed: 28
Plan Total Prescribed Dose: 70 Gy
Reference Point Dosage Given to Date: 70 Gy
Reference Point Session Dosage Given: 2.5 Gy
Session Number: 28

## 2023-11-03 ENCOUNTER — Ambulatory Visit: Payer: Medicare HMO

## 2023-11-03 NOTE — Radiation Completion Notes (Signed)
Patient Name: Bradley Bowers, Bradley Bowers MRN: [id number] Date of Birth: 12/09/1945 Referring Physician: Bethann Punches, M.D. Date of Service: 2023-11-03 Radiation Oncologist: Carmina Miller, M.D. [facility]                             RADIATION ONCOLOGY END OF TREATMENT NOTE     Diagnosis: C61 Malignant neoplasm of prostate Intent: Curative     HPI: Patient is a 79 year old male who presented with a PSA that jumped to 11.1.  He was fairly asymptomatic.  MRI scan was ordered showing 2 PI-RADS category 3 lesions in the left peripheral zone.  There is no evidence of pelvic adenopathy bone metastasis seminal vesicle involvement or transscapular spread.  He went on to have biopsy of his prostate showing 4 out of 12 cores from the left side of his prostate mostly Gleason 9 (4+5) as well as Gleason 8 (4+4).  PSMA PET scan has been ordered.  Patient is from a urologic standpoint doing well specifically denies any urinary frequency urgency nocturia.  He is having no bowel problems.  He has no bone pain.  Patient is wheelchair-bound and oxygen dependent.  He is now referred to radiation oncology for consideration of treatment.      ==========DELIVERED PLANS==========  First Treatment Date: 2023-09-17 Last Treatment Date: 2023-10-31   Plan Name: Prostate Site: Prostate Technique: IMRT Mode: Photon Dose Per Fraction: 2.5 Gy Prescribed Dose (Delivered / Prescribed): 70 Gy / 70 Gy Prescribed Fxs (Delivered / Prescribed): 28 / 28     ==========ON TREATMENT VISIT DATES========== 2023-09-23, 2023-09-29, 2023-10-07, 2023-10-14, 2023-10-21, 2023-10-28     ==========UPCOMING VISITS==========       ==========APPENDIX - ON TREATMENT VISIT NOTES==========   See weekly On Treatment Notes in Epic for details in the Media tab (listed as Progress notes on the On Treatment Visit Dates listed above).

## 2023-11-04 ENCOUNTER — Ambulatory Visit: Payer: Medicare HMO

## 2023-11-04 DIAGNOSIS — Z79899 Other long term (current) drug therapy: Secondary | ICD-10-CM | POA: Diagnosis not present

## 2023-11-04 DIAGNOSIS — R739 Hyperglycemia, unspecified: Secondary | ICD-10-CM | POA: Diagnosis not present

## 2023-11-04 DIAGNOSIS — E782 Mixed hyperlipidemia: Secondary | ICD-10-CM | POA: Diagnosis not present

## 2023-11-05 ENCOUNTER — Ambulatory Visit: Payer: Medicare HMO

## 2023-11-05 ENCOUNTER — Inpatient Hospital Stay: Payer: Medicare HMO

## 2023-11-06 ENCOUNTER — Ambulatory Visit: Payer: Medicare HMO

## 2023-11-07 ENCOUNTER — Ambulatory Visit: Payer: Medicare HMO

## 2023-11-10 ENCOUNTER — Ambulatory Visit: Payer: Medicare HMO

## 2023-11-11 ENCOUNTER — Ambulatory Visit: Payer: Medicare HMO

## 2023-11-11 DIAGNOSIS — J449 Chronic obstructive pulmonary disease, unspecified: Secondary | ICD-10-CM | POA: Diagnosis not present

## 2023-11-11 DIAGNOSIS — Z125 Encounter for screening for malignant neoplasm of prostate: Secondary | ICD-10-CM | POA: Diagnosis not present

## 2023-11-11 DIAGNOSIS — I2781 Cor pulmonale (chronic): Secondary | ICD-10-CM | POA: Diagnosis not present

## 2023-11-11 DIAGNOSIS — L570 Actinic keratosis: Secondary | ICD-10-CM | POA: Diagnosis not present

## 2023-11-11 DIAGNOSIS — F03A0 Unspecified dementia, mild, without behavioral disturbance, psychotic disturbance, mood disturbance, and anxiety: Secondary | ICD-10-CM | POA: Diagnosis not present

## 2023-11-11 DIAGNOSIS — C44320 Squamous cell carcinoma of skin of unspecified parts of face: Secondary | ICD-10-CM | POA: Diagnosis not present

## 2023-11-11 DIAGNOSIS — G40909 Epilepsy, unspecified, not intractable, without status epilepticus: Secondary | ICD-10-CM | POA: Diagnosis not present

## 2023-11-12 ENCOUNTER — Ambulatory Visit: Payer: Medicare HMO

## 2023-11-13 ENCOUNTER — Ambulatory Visit: Payer: Medicare HMO

## 2023-11-14 ENCOUNTER — Ambulatory Visit: Payer: Medicare HMO

## 2023-11-19 DIAGNOSIS — D0422 Carcinoma in situ of skin of left ear and external auricular canal: Secondary | ICD-10-CM | POA: Diagnosis not present

## 2023-11-19 DIAGNOSIS — L570 Actinic keratosis: Secondary | ICD-10-CM | POA: Diagnosis not present

## 2023-11-19 DIAGNOSIS — C44329 Squamous cell carcinoma of skin of other parts of face: Secondary | ICD-10-CM | POA: Diagnosis not present

## 2023-11-19 DIAGNOSIS — D485 Neoplasm of uncertain behavior of skin: Secondary | ICD-10-CM | POA: Diagnosis not present

## 2023-11-24 ENCOUNTER — Ambulatory Visit: Payer: Medicare HMO | Admitting: Radiation Oncology

## 2023-12-08 ENCOUNTER — Ambulatory Visit
Admission: RE | Admit: 2023-12-08 | Discharge: 2023-12-08 | Disposition: A | Discharge status: Home / Self Care | Payer: Medicare HMO | Source: Ambulatory Visit | Attending: Radiation Oncology | Admitting: Radiation Oncology

## 2023-12-08 ENCOUNTER — Encounter: Payer: Self-pay | Admitting: Radiation Oncology

## 2023-12-08 ENCOUNTER — Other Ambulatory Visit: Payer: Self-pay | Admitting: *Deleted

## 2023-12-08 VITALS — BP 135/86 | HR 108 | Temp 97.6°F | Resp 16

## 2023-12-08 DIAGNOSIS — C61 Malignant neoplasm of prostate: Secondary | ICD-10-CM | POA: Diagnosis not present

## 2023-12-08 NOTE — Progress Notes (Signed)
 Radiation Oncology Follow up Note  Name: Bradley Bowers   Date:   12/08/2023 MRN:  [id number] DOB: 1946-02-23    This 78 y.o. male presents to the clinic today for 1 month follow-up status post image guided IMRT radiation therapy for stage IIIc (cT1 cN0 M0) Gleason 9 (4+5) adenocarcinoma presenting with a PSA in the 11 range.  REFERRING PROVIDER: Danella Penton, MD  HPI: Patient is a 78 year old male.  Now out 1 month having completed image guided IMRT radiation therapy to his prostate for a Gleason 9 (4+5) adenocarcinoma.  His PSMA PET scan did not show any evidence of nodal metastasis based on his overall condition I just treated his prostate.  He is seen today in follow-up doing well specifically denies any increased lower urinary tract symptoms diarrhea fatigue.  COMPLICATIONS OF TREATMENT: none  FOLLOW UP COMPLIANCE: keeps appointments   PHYSICAL EXAM:  BP 135/86   Pulse (!) 108   Temp 97.6 F (36.4 C) (Temporal)   Resp 16  Well-developed well-nourished patient in NAD. HEENT reveals PERLA, EOMI, discs not visualized.  Oral cavity is clear. No oral mucosal lesions are identified. Neck is clear without evidence of cervical or supraclavicular adenopathy. Lungs are clear to A&P. Cardiac examination is essentially unremarkable with regular rate and rhythm without murmur rub or thrill. Abdomen is benign with no organomegaly or masses noted. Motor sensory and DTR levels are equal and symmetric in the upper and lower extremities. Cranial nerves II through XII are grossly intact. Proprioception is intact. No peripheral adenopathy or edema is identified. No motor or sensory levels are noted. Crude visual fields are within normal range.  RADIOLOGY RESULTS: No current films for review  PLAN: Present time patient is doing well 1 month out from radiation therapy.  And pleased with his overall progress.  Of asked to see him back in 3 months for follow-up.  Will obtain a PSA before that appointment.   Patient is to call with any concerns.  I would like to take this opportunity to thank you for allowing me to participate in the care of your patient.Carmina Miller, MD

## 2023-12-10 DIAGNOSIS — C44329 Squamous cell carcinoma of skin of other parts of face: Secondary | ICD-10-CM | POA: Diagnosis not present

## 2023-12-24 DIAGNOSIS — C44229 Squamous cell carcinoma of skin of left ear and external auricular canal: Secondary | ICD-10-CM | POA: Diagnosis not present

## 2024-03-02 ENCOUNTER — Inpatient Hospital Stay: Attending: Radiation Oncology

## 2024-03-02 DIAGNOSIS — C61 Malignant neoplasm of prostate: Secondary | ICD-10-CM | POA: Diagnosis not present

## 2024-03-02 LAB — CBC (CANCER CENTER ONLY)
HCT: 41.5 % (ref 39.0–52.0)
Hemoglobin: 14.1 g/dL (ref 13.0–17.0)
MCH: 29.9 pg (ref 26.0–34.0)
MCHC: 34 g/dL (ref 30.0–36.0)
MCV: 88.1 fL (ref 80.0–100.0)
Platelet Count: 174 10*3/uL (ref 150–400)
RBC: 4.71 MIL/uL (ref 4.22–5.81)
RDW: 11.9 % (ref 11.5–15.5)
WBC Count: 8 10*3/uL (ref 4.0–10.5)
nRBC: 0 % (ref 0.0–0.2)

## 2024-03-02 LAB — PSA: Prostatic Specific Antigen: <0.01 ng/mL (ref 0.00–4.00)

## 2024-03-09 ENCOUNTER — Ambulatory Visit
Admission: RE | Admit: 2024-03-09 | Discharge: 2024-03-09 | Disposition: A | Discharge status: Home / Self Care | Source: Ambulatory Visit | Attending: Radiation Oncology | Admitting: Radiation Oncology

## 2024-03-09 ENCOUNTER — Other Ambulatory Visit: Payer: Self-pay | Admitting: *Deleted

## 2024-03-09 ENCOUNTER — Encounter: Payer: Self-pay | Admitting: Radiation Oncology

## 2024-03-09 VITALS — BP 139/91 | HR 94 | Temp 98.0°F | Resp 16

## 2024-03-09 DIAGNOSIS — C61 Malignant neoplasm of prostate: Secondary | ICD-10-CM

## 2024-03-09 DIAGNOSIS — Z191 Hormone sensitive malignancy status: Secondary | ICD-10-CM | POA: Diagnosis not present

## 2024-03-09 NOTE — Progress Notes (Signed)
 Radiation Oncology Follow up Note  Name: Bradley Bowers   Date:   03/09/2024 MRN:  [id number] DOB: 23-Aug-1946    This 78 y.o. male presents to the clinic today for 66-month follow-up status post image guided IMRT radiation therapy for stage IIIc (T1 N0 M0 Gleason 9 (4+5) adenocarcinoma the prostate presenting with a PSA in the 11 range.  REFERRING PROVIDER: Sari Cunning, MD  HPI: Patient is a 78 year old male now out 1 month having completed image guided radiation therapy for Gleason 9 adenocarcinoma.  He is seen today in routine follow-up is doing well specifically denies any increased lower urinary tract symptoms diarrhea or fatigue he is having no bone pain.  His initial PSMA PET scan did show some questionable areas of rib and ischial involvement although was very indeterminant.  His most recent PSA recently was less than 0.01.  COMPLICATIONS OF TREATMENT: none  FOLLOW UP COMPLIANCE: keeps appointments   PHYSICAL EXAM:  BP (!) 139/91   Pulse 94   Temp 98 F (36.7 C) (Tympanic)   Resp 16  Well-developed well-nourished patient in NAD. HEENT reveals PERLA, EOMI, discs not visualized.  Oral cavity is clear. No oral mucosal lesions are identified. Neck is clear without evidence of cervical or supraclavicular adenopathy. Lungs are clear to A&P. Cardiac examination is essentially unremarkable with regular rate and rhythm without murmur rub or thrill. Abdomen is benign with no organomegaly or masses noted. Motor sensory and DTR levels are equal and symmetric in the upper and lower extremities. Cranial nerves II through XII are grossly intact. Proprioception is intact. No peripheral adenopathy or edema is identified. No motor or sensory levels are noted. Crude visual fields are within normal range.  RADIOLOGY RESULTS: His PSMA PET scan was again reviewed  PLAN: Present time patient is doing well under excellent biochemical control of his prostate cancer on his first PSA status post radiation  therapy treatment.  And pleased with his overall progress and low side effect profile.  I have asked to see him back in 6 months for follow-up with repeat PSA.  Patient comprehends my recommendations well.  I have explained to him I do not think those PSMA PET call on his scan and his bone were of clinical significant although we will continue to watch that area in the future is low PSA score also confirms probable not involvement of bone.  I would like to take this opportunity to thank you for allowing me to participate in the care of your patient.Glenis Langdon, MD

## 2024-04-13 DIAGNOSIS — R0981 Nasal congestion: Secondary | ICD-10-CM | POA: Diagnosis not present

## 2024-04-13 DIAGNOSIS — Z87891 Personal history of nicotine dependence: Secondary | ICD-10-CM | POA: Diagnosis not present

## 2024-04-13 DIAGNOSIS — J441 Chronic obstructive pulmonary disease with (acute) exacerbation: Secondary | ICD-10-CM | POA: Diagnosis not present

## 2024-04-13 DIAGNOSIS — J432 Centrilobular emphysema: Secondary | ICD-10-CM | POA: Diagnosis not present

## 2024-05-13 ENCOUNTER — Emergency Department

## 2024-05-13 ENCOUNTER — Encounter: Payer: Self-pay | Admitting: *Deleted

## 2024-05-13 ENCOUNTER — Observation Stay
Admission: EM | Admit: 2024-05-13 | Discharge: 2024-05-15 | Disposition: A | Discharge status: Home / Self Care | Attending: Internal Medicine | Admitting: Internal Medicine

## 2024-05-13 ENCOUNTER — Other Ambulatory Visit: Payer: Self-pay

## 2024-05-13 DIAGNOSIS — I2510 Atherosclerotic heart disease of native coronary artery without angina pectoris: Secondary | ICD-10-CM | POA: Insufficient documentation

## 2024-05-13 DIAGNOSIS — I480 Paroxysmal atrial fibrillation: Secondary | ICD-10-CM | POA: Insufficient documentation

## 2024-05-13 DIAGNOSIS — S80211A Abrasion, right knee, initial encounter: Secondary | ICD-10-CM | POA: Diagnosis not present

## 2024-05-13 DIAGNOSIS — I110 Hypertensive heart disease with heart failure: Secondary | ICD-10-CM | POA: Diagnosis not present

## 2024-05-13 DIAGNOSIS — I509 Heart failure, unspecified: Secondary | ICD-10-CM | POA: Diagnosis not present

## 2024-05-13 DIAGNOSIS — M25561 Pain in right knee: Secondary | ICD-10-CM | POA: Diagnosis not present

## 2024-05-13 DIAGNOSIS — J449 Chronic obstructive pulmonary disease, unspecified: Secondary | ICD-10-CM | POA: Insufficient documentation

## 2024-05-13 DIAGNOSIS — H409 Unspecified glaucoma: Secondary | ICD-10-CM | POA: Diagnosis not present

## 2024-05-13 DIAGNOSIS — N179 Acute kidney failure, unspecified: Secondary | ICD-10-CM | POA: Diagnosis present

## 2024-05-13 DIAGNOSIS — S0011XA Contusion of right eyelid and periocular area, initial encounter: Secondary | ICD-10-CM | POA: Diagnosis not present

## 2024-05-13 DIAGNOSIS — S0010XA Contusion of unspecified eyelid and periocular area, initial encounter: Secondary | ICD-10-CM | POA: Diagnosis not present

## 2024-05-13 DIAGNOSIS — R7989 Other specified abnormal findings of blood chemistry: Secondary | ICD-10-CM

## 2024-05-13 DIAGNOSIS — R58 Hemorrhage, not elsewhere classified: Secondary | ICD-10-CM | POA: Diagnosis not present

## 2024-05-13 DIAGNOSIS — J45909 Unspecified asthma, uncomplicated: Secondary | ICD-10-CM | POA: Insufficient documentation

## 2024-05-13 DIAGNOSIS — S80212A Abrasion, left knee, initial encounter: Secondary | ICD-10-CM | POA: Diagnosis not present

## 2024-05-13 DIAGNOSIS — M25562 Pain in left knee: Secondary | ICD-10-CM | POA: Diagnosis not present

## 2024-05-13 DIAGNOSIS — R0602 Shortness of breath: Secondary | ICD-10-CM | POA: Diagnosis present

## 2024-05-13 DIAGNOSIS — R296 Repeated falls: Secondary | ICD-10-CM | POA: Diagnosis not present

## 2024-05-13 DIAGNOSIS — K219 Gastro-esophageal reflux disease without esophagitis: Secondary | ICD-10-CM | POA: Insufficient documentation

## 2024-05-13 DIAGNOSIS — R4189 Other symptoms and signs involving cognitive functions and awareness: Secondary | ICD-10-CM | POA: Insufficient documentation

## 2024-05-13 DIAGNOSIS — J40 Bronchitis, not specified as acute or chronic: Secondary | ICD-10-CM | POA: Diagnosis not present

## 2024-05-13 DIAGNOSIS — R531 Weakness: Secondary | ICD-10-CM | POA: Diagnosis not present

## 2024-05-13 DIAGNOSIS — S0012XA Contusion of left eyelid and periocular area, initial encounter: Secondary | ICD-10-CM | POA: Diagnosis not present

## 2024-05-13 DIAGNOSIS — Y92009 Unspecified place in unspecified non-institutional (private) residence as the place of occurrence of the external cause: Principal | ICD-10-CM | POA: Insufficient documentation

## 2024-05-13 DIAGNOSIS — F310 Bipolar disorder, current episode hypomanic: Secondary | ICD-10-CM | POA: Diagnosis not present

## 2024-05-13 DIAGNOSIS — W19XXXA Unspecified fall, initial encounter: Secondary | ICD-10-CM | POA: Diagnosis not present

## 2024-05-13 DIAGNOSIS — M4802 Spinal stenosis, cervical region: Secondary | ICD-10-CM | POA: Diagnosis not present

## 2024-05-13 DIAGNOSIS — S80219A Abrasion, unspecified knee, initial encounter: Secondary | ICD-10-CM

## 2024-05-13 DIAGNOSIS — I214 Non-ST elevation (NSTEMI) myocardial infarction: Secondary | ICD-10-CM | POA: Diagnosis not present

## 2024-05-13 DIAGNOSIS — F09 Unspecified mental disorder due to known physiological condition: Secondary | ICD-10-CM

## 2024-05-13 DIAGNOSIS — M47812 Spondylosis without myelopathy or radiculopathy, cervical region: Secondary | ICD-10-CM | POA: Diagnosis not present

## 2024-05-13 DIAGNOSIS — I10 Essential (primary) hypertension: Secondary | ICD-10-CM | POA: Diagnosis not present

## 2024-05-13 DIAGNOSIS — F319 Bipolar disorder, unspecified: Secondary | ICD-10-CM | POA: Diagnosis present

## 2024-05-13 LAB — URINALYSIS, W/ REFLEX TO CULTURE (INFECTION SUSPECTED)
Bacteria, UA: NONE SEEN
Bilirubin Urine: NEGATIVE
Glucose, UA: 50 mg/dL — AB
Ketones, ur: NEGATIVE mg/dL
Leukocytes,Ua: NEGATIVE
Nitrite: NEGATIVE
Protein, ur: 100 mg/dL — AB
Specific Gravity, Urine: 1.019 (ref 1.005–1.030)
pH: 5 (ref 5.0–8.0)

## 2024-05-13 LAB — BASIC METABOLIC PANEL WITH GFR
Anion gap: 14 (ref 5–15)
BUN: 25 mg/dL — ABNORMAL HIGH (ref 8–23)
CO2: 24 mmol/L (ref 22–32)
Calcium: 9.1 mg/dL (ref 8.9–10.3)
Chloride: 99 mmol/L (ref 98–111)
Creatinine, Ser: 1.62 mg/dL — ABNORMAL HIGH (ref 0.61–1.24)
GFR, Estimated: 44 mL/min — ABNORMAL LOW (ref >60)
Glucose, Bld: 170 mg/dL — ABNORMAL HIGH (ref 70–99)
Potassium: 3.6 mmol/L (ref 3.5–5.1)
Sodium: 137 mmol/L (ref 135–145)

## 2024-05-13 LAB — TROPONIN I (HIGH SENSITIVITY)
Troponin I (High Sensitivity): 161 ng/L (ref <18)
Troponin I (High Sensitivity): 404 ng/L (ref <18)
Troponin I (High Sensitivity): 533 ng/L (ref <18)

## 2024-05-13 LAB — CBC
HCT: 39 % (ref 39.0–52.0)
Hemoglobin: 13.4 g/dL (ref 13.0–17.0)
MCH: 30.7 pg (ref 26.0–34.0)
MCHC: 34.4 g/dL (ref 30.0–36.0)
MCV: 89.4 fL (ref 80.0–100.0)
Platelets: 175 K/uL (ref 150–400)
RBC: 4.36 MIL/uL (ref 4.22–5.81)
RDW: 12.8 % (ref 11.5–15.5)
WBC: 14.5 K/uL — ABNORMAL HIGH (ref 4.0–10.5)
nRBC: 0 % (ref 0.0–0.2)

## 2024-05-13 LAB — PROTIME-INR
INR: 1 (ref 0.8–1.2)
Prothrombin Time: 14.3 s (ref 11.4–15.2)

## 2024-05-13 MED ORDER — ALBUTEROL SULFATE (2.5 MG/3ML) 0.083% IN NEBU: 2.5000 mg | INHALATION_SOLUTION | RESPIRATORY_TRACT | Status: DC | PRN

## 2024-05-13 MED ORDER — GUAIFENESIN ER 600 MG PO TB12
600.0000 mg | ORAL_TABLET | Freq: Two times a day (BID) | ORAL | Status: DC | PRN
  Administered 2024-05-14: 600 mg via ORAL
  Filled 2024-05-13: qty 1

## 2024-05-13 MED ORDER — TRAZODONE HCL 50 MG PO TABS
25.0000 mg | ORAL_TABLET | Freq: Every evening | ORAL | Status: DC | PRN
  Administered 2024-05-14: 25 mg via ORAL
  Filled 2024-05-13: qty 1

## 2024-05-13 MED ORDER — ONDANSETRON HCL 4 MG/2ML IJ SOLN: 4.0000 mg | Freq: Four times a day (QID) | INTRAMUSCULAR | Status: DC | PRN

## 2024-05-13 MED ORDER — DIVALPROEX SODIUM ER 500 MG PO TB24
500.0000 mg | ORAL_TABLET | Freq: Every day | ORAL | Status: DC
Start: 2024-05-14 — End: 2024-05-15
  Administered 2024-05-14 – 2024-05-15 (×2): 500 mg via ORAL
  Filled 2024-05-13 (×2): qty 1

## 2024-05-13 MED ORDER — VITAMIN D 25 MCG (1000 UNIT) PO TABS
1000.0000 [IU] | ORAL_TABLET | Freq: Every day | ORAL | Status: DC
  Administered 2024-05-14 – 2024-05-15 (×2): 1000 [IU] via ORAL
  Filled 2024-05-13 (×2): qty 1

## 2024-05-13 MED ORDER — ACETAMINOPHEN 325 MG PO TABS
650.0000 mg | ORAL_TABLET | ORAL | Status: DC | PRN
  Administered 2024-05-14: 650 mg via ORAL
  Filled 2024-05-13: qty 2

## 2024-05-13 MED ORDER — DILTIAZEM HCL ER COATED BEADS 120 MG PO CP24
120.0000 mg | ORAL_CAPSULE | Freq: Every day | ORAL | Status: DC
Start: 2024-05-14 — End: 2024-05-15
  Administered 2024-05-14 – 2024-05-15 (×2): 120 mg via ORAL
  Filled 2024-05-13 (×2): qty 1

## 2024-05-13 MED ORDER — ENSURE ENLIVE PO LIQD
237.0000 mL | Freq: Two times a day (BID) | ORAL | Status: DC
  Administered 2024-05-14: 237 mL via ORAL
  Filled 2024-05-13: qty 237

## 2024-05-13 MED ORDER — VITAMIN B-12 1000 MCG PO TABS
1000.0000 ug | ORAL_TABLET | Freq: Every day | ORAL | Status: DC
  Administered 2024-05-14 – 2024-05-15 (×2): 1000 ug via ORAL
  Filled 2024-05-13 (×2): qty 1

## 2024-05-13 MED ORDER — LACTATED RINGERS IV BOLUS
1000.0000 mL | Freq: Once | INTRAVENOUS | Status: AC
  Administered 2024-05-13: 1000 mL via INTRAVENOUS

## 2024-05-13 MED ORDER — TIMOLOL MALEATE 0.5 % OP SOLN
1.0000 [drp] | Freq: Every day | OPHTHALMIC | Status: DC
  Administered 2024-05-14 – 2024-05-15 (×2): 1 [drp] via OPHTHALMIC
  Filled 2024-05-13: qty 5

## 2024-05-13 MED ORDER — ATORVASTATIN CALCIUM 80 MG PO TABS
80.0000 mg | ORAL_TABLET | Freq: Every day | ORAL | Status: DC
  Administered 2024-05-14 – 2024-05-15 (×2): 80 mg via ORAL
  Filled 2024-05-13 (×2): qty 1

## 2024-05-13 MED ORDER — NITROGLYCERIN 0.4 MG SL SUBL: 0.4000 mg | SUBLINGUAL_TABLET | SUBLINGUAL | Status: DC | PRN

## 2024-05-13 MED ORDER — MAGNESIUM HYDROXIDE 400 MG/5ML PO SUSP: 30.0000 mL | Freq: Every day | ORAL | Status: DC | PRN

## 2024-05-13 MED ORDER — HEPARIN (PORCINE) 25000 UT/250ML-% IV SOLN
1100.0000 [IU]/h | INTRAVENOUS | Status: DC
  Administered 2024-05-13: 1100 [IU]/h via INTRAVENOUS
  Filled 2024-05-13: qty 250

## 2024-05-13 MED ORDER — MORPHINE SULFATE (PF) 2 MG/ML IV SOLN: 2.0000 mg | INTRAVENOUS | Status: DC | PRN

## 2024-05-13 MED ORDER — HEPARIN BOLUS VIA INFUSION
4000.0000 [IU] | Freq: Once | INTRAVENOUS | Status: AC
  Administered 2024-05-13: 4000 [IU] via INTRAVENOUS
  Filled 2024-05-13: qty 4000

## 2024-05-13 MED ORDER — ASPIRIN 81 MG PO CHEW
324.0000 mg | CHEWABLE_TABLET | ORAL | Status: AC
  Administered 2024-05-13: 324 mg via ORAL
  Filled 2024-05-13: qty 4

## 2024-05-13 MED ORDER — TIOTROPIUM BROMIDE MONOHYDRATE 18 MCG IN CAPS: 18.0000 ug | ORAL_CAPSULE | Freq: Every day | RESPIRATORY_TRACT | Status: DC

## 2024-05-13 MED ORDER — SODIUM CHLORIDE 0.9 % IV SOLN: INTRAVENOUS | Status: DC

## 2024-05-13 MED ORDER — ASPIRIN 300 MG RE SUPP: 300.0000 mg | RECTAL | Status: AC

## 2024-05-13 MED ORDER — BACITRACIN ZINC 500 UNIT/GM EX OINT
TOPICAL_OINTMENT | Freq: Once | CUTANEOUS | Status: AC
  Administered 2024-05-13: 1 via TOPICAL
  Filled 2024-05-13: qty 0.9

## 2024-05-13 MED ORDER — MONTELUKAST SODIUM 10 MG PO TABS
10.0000 mg | ORAL_TABLET | Freq: Every day | ORAL | Status: DC
  Administered 2024-05-13 – 2024-05-14 (×2): 10 mg via ORAL
  Filled 2024-05-13 (×2): qty 1

## 2024-05-13 MED ORDER — ASPIRIN 81 MG PO TBEC
81.0000 mg | DELAYED_RELEASE_TABLET | Freq: Every day | ORAL | Status: DC
  Administered 2024-05-14 – 2024-05-15 (×2): 81 mg via ORAL
  Filled 2024-05-13 (×2): qty 1

## 2024-05-13 MED ORDER — ALBUTEROL SULFATE HFA 108 (90 BASE) MCG/ACT IN AERS: 1.0000 | INHALATION_SPRAY | RESPIRATORY_TRACT | Status: DC | PRN

## 2024-05-13 MED ORDER — METOPROLOL TARTRATE 25 MG PO TABS
12.5000 mg | ORAL_TABLET | Freq: Two times a day (BID) | ORAL | Status: DC
  Administered 2024-05-13 – 2024-05-15 (×4): 12.5 mg via ORAL
  Filled 2024-05-13 (×4): qty 1

## 2024-05-13 MED ORDER — UMECLIDINIUM BROMIDE 62.5 MCG/ACT IN AEPB
1.0000 | INHALATION_SPRAY | Freq: Every day | RESPIRATORY_TRACT | Status: DC
  Administered 2024-05-14 – 2024-05-15 (×2): 1 via RESPIRATORY_TRACT
  Filled 2024-05-13: qty 7

## 2024-05-13 MED ORDER — DONEPEZIL HCL 5 MG PO TABS
10.0000 mg | ORAL_TABLET | Freq: Every day | ORAL | Status: DC
  Administered 2024-05-13 – 2024-05-14 (×2): 10 mg via ORAL
  Filled 2024-05-13 (×3): qty 2

## 2024-05-13 MED ORDER — PANTOPRAZOLE SODIUM 40 MG PO TBEC
40.0000 mg | DELAYED_RELEASE_TABLET | Freq: Two times a day (BID) | ORAL | Status: DC
  Administered 2024-05-13 – 2024-05-15 (×4): 40 mg via ORAL
  Filled 2024-05-13 (×4): qty 1

## 2024-05-13 MED ORDER — LATANOPROST 0.005 % OP SOLN: 1.0000 [drp] | Freq: Every day | OPHTHALMIC | Status: DC

## 2024-05-13 MED ORDER — ATORVASTATIN CALCIUM 80 MG PO TABS: 80.0000 mg | ORAL_TABLET | Freq: Every day | ORAL | Status: DC

## 2024-05-13 MED ORDER — LATANOPROST 0.005 % OP SOLN
1.0000 [drp] | Freq: Every day | OPHTHALMIC | Status: DC
  Administered 2024-05-13: 1 [drp] via OPHTHALMIC
  Filled 2024-05-13: qty 2.5

## 2024-05-13 MED ORDER — CARBAMAZEPINE 200 MG PO TABS
200.0000 mg | ORAL_TABLET | Freq: Two times a day (BID) | ORAL | Status: DC
Start: 2024-05-13 — End: 2024-05-15
  Administered 2024-05-13 – 2024-05-15 (×4): 200 mg via ORAL
  Filled 2024-05-13 (×5): qty 1

## 2024-05-13 MED ORDER — CLOPIDOGREL BISULFATE 75 MG PO TABS: 75.0000 mg | ORAL_TABLET | Freq: Every day | ORAL | Status: DC

## 2024-05-13 NOTE — ED Provider Notes (Signed)
 [facility] Provider Note   Event Date/Time   First MD Initiated Contact with Patient 05/13/24 1617     (approximate) History  Fall  HPI Bradley Bowers is a 78 y.o. male with a stated past medical history of atrial fibrillation, COPD, CHF, CAD, hypertension, and hyperlipidemia who presents via EMS after multiple falls over the last week.  Patient also states some intermittent shortness of breath and chest pain over this time.  Patient is currently on 3 L of oxygen  at home and placed on 3 L nasal cannula in triage due to hypoxia.  Patient complains of abrasions to bilateral knees where he slid out of his chair onto the floor today.  Patient denies any exacerbating or relieving factors for the symptoms.   ROS: Patient currently denies any vision changes, tinnitus, difficulty speaking, facial droop, sore throat, chest pain, shortness of breath, abdominal pain, nausea/vomiting/diarrhea, dysuria, or weakness/numbness/paresthesias in any extremity   Physical Exam  Triage Vital Signs: ED Triage Vitals  Encounter Vitals Group     BP 05/13/24 1538 131/79     Girls Systolic BP Percentile --      Girls Diastolic BP Percentile --      Boys Systolic BP Percentile --      Boys Diastolic BP Percentile --      Pulse Rate 05/13/24 1538 (!) 114     Resp 05/13/24 1538 (!) 22     Temp 05/13/24 1538 98.4 F (36.9 C)     Temp Source 05/13/24 1538 Oral     SpO2 05/13/24 1538 (!) 87 %     Weight 05/13/24 1539 183 lb (83 kg)     Height 05/13/24 1539 5' 7 (1.702 m)     Head Circumference --      Peak Flow --      Pain Score 05/13/24 1539 3     Pain Loc --      Pain Education --      Exclude from Growth Chart --    Most recent vital signs: Vitals:   05/13/24 1845 05/13/24 1900  BP:  121/83  Pulse: 78 (!) 109  Resp: 17 18  Temp:    SpO2: 100% 97%   General: Awake, oriented x4. CV:  Good peripheral perfusion. Resp:  Normal effort.  CTAB Abd:  No  distention. Other:  Elderly overweight Caucasian male resting comfortably in no acute distress.  Scattered abrasions over bilateral upper and lower extremities.  Full range of motion in all joints without significant pain ED Results / Procedures / Treatments  Labs (all labs ordered are listed, but only abnormal results are displayed) Labs Reviewed  BASIC METABOLIC PANEL WITH GFR - Abnormal; Notable for the following components:      Result Value   Glucose, Bld 170 (*)    BUN 25 (*)    Creatinine, Ser 1.62 (*)    GFR, Estimated 44 (*)    All other components within normal limits  CBC - Abnormal; Notable for the following components:   WBC 14.5 (*)    All other components within normal limits  URINALYSIS, W/ REFLEX TO CULTURE (INFECTION SUSPECTED) - Abnormal; Notable for the following components:   Color, Urine YELLOW (*)    APPearance HAZY (*)    Glucose, UA 50 (*)    Hgb urine dipstick MODERATE (*)    Protein, ur 100 (*)    All other components within normal limits  TROPONIN I (HIGH SENSITIVITY) - Abnormal;  Notable for the following components:   Troponin I (High Sensitivity) 161 (*)    All other components within normal limits  TROPONIN I (HIGH SENSITIVITY) - Abnormal; Notable for the following components:   Troponin I (High Sensitivity) 404 (*)    All other components within normal limits  PROTIME-INR  LIPOPROTEIN A (LPA)  BASIC METABOLIC PANEL WITH GFR  LIPID PANEL  CBC  TROPONIN I (HIGH SENSITIVITY)   EKG ED ECG REPORT I, Artist MARLA Kerns, the attending physician, personally viewed and interpreted this ECG. Date: 05/13/2024 EKG Time: 1538 Rate: 102 Rhythm: Atrial fibrillation QRS Axis: normal Intervals: normal ST/T Wave abnormalities: normal Narrative Interpretation: Atrial fibrillation.  No evidence of acute ischemia RADIOLOGY ED MD interpretation: CT of the head without contrast interpreted by me shows no evidence of acute abnormalities including no intracerebral  hemorrhage, obvious masses, or significant edema CT of the cervical spine interpreted by me does not show any evidence of acute abnormalities including no acute fracture, malalignment, height loss, or dislocation X-ray of the chest and bilateral knees showed no evidence of acute abnormalities - All radiology independently interpreted and agree with radiology assessment Official radiology report(s): CT Cervical Spine Wo Contrast Result Date: 05/13/2024 CLINICAL DATA:  Weakness, multiple falls EXAM: CT CERVICAL SPINE WITHOUT CONTRAST TECHNIQUE: Multidetector CT imaging of the cervical spine was performed without intravenous contrast. Multiplanar CT image reconstructions were also generated. RADIATION DOSE REDUCTION: This exam was performed according to the departmental dose-optimization program which includes automated exposure control, adjustment of the mA and/or kV according to patient size and/or use of iterative reconstruction technique. COMPARISON:  None Available. FINDINGS: Alignment: Alignment is grossly anatomic. Skull base and vertebrae: No acute fracture. No primary bone lesion or focal pathologic process. Soft tissues and spinal canal: No prevertebral fluid or swelling. No visible canal hematoma. Disc levels: Multilevel spondylosis and facet hypertrophy, with disc space narrowing greatest at C3-4 and from C5-6 through C7-T1. Upper chest: Airway is patent. Mild emphysematous changes at the lung apices. Other: Reconstructed images demonstrate no additional findings. IMPRESSION: 1. No acute cervical spine fracture. 2. Multilevel cervical degenerative changes as above. Electronically Signed   By: Ozell Daring M.D.   On: 05/13/2024 19:35   CT Head Wo Contrast Result Date: 05/13/2024 CLINICAL DATA:  Weakness, recent falls EXAM: CT HEAD WITHOUT CONTRAST TECHNIQUE: Contiguous axial images were obtained from the base of the skull through the vertex without intravenous contrast. RADIATION DOSE REDUCTION:  This exam was performed according to the departmental dose-optimization program which includes automated exposure control, adjustment of the mA and/or kV according to patient size and/or use of iterative reconstruction technique. COMPARISON:  09/03/2023 FINDINGS: Brain: No acute infarct or hemorrhage. Stable chronic small vessel ischemic changes within the right thalamus and periventricular white matter. Lateral ventricles and midline structures are stable. No acute extra-axial fluid collections. No mass effect. Vascular: No hyperdense vessel or unexpected calcification. Skull: Normal. Negative for fracture or focal lesion. Sinuses/Orbits: Mild soft tissue swelling over the nasal bridge. The paranasal sinuses are clear. Other: None. IMPRESSION: 1. Soft tissue swelling over the nasal bridge. No displaced fracture. 2. No acute intracranial process. Electronically Signed   By: Ozell Daring M.D.   On: 05/13/2024 19:31   DG Knee Left Port Result Date: 05/13/2024 CLINICAL DATA:  Fall with significant bilateral knee pain. EXAM: PORTABLE LEFT KNEE - 1-2 VIEW; PORTABLE RIGHT KNEE - 1-2 VIEW COMPARISON:  None Available. FINDINGS: No evidence of fracture, dislocation, or joint effusion. No evidence  of arthropathy or other focal bone abnormality. Soft tissues are unremarkable. IMPRESSION: Negative. Electronically Signed   By: Norman Gatlin M.D.   On: 05/13/2024 19:09   DG Knee Right Port Result Date: 05/13/2024 CLINICAL DATA:  Fall with significant bilateral knee pain. EXAM: PORTABLE LEFT KNEE - 1-2 VIEW; PORTABLE RIGHT KNEE - 1-2 VIEW COMPARISON:  None Available. FINDINGS: No evidence of fracture, dislocation, or joint effusion. No evidence of arthropathy or other focal bone abnormality. Soft tissues are unremarkable. IMPRESSION: Negative. Electronically Signed   By: Norman Gatlin M.D.   On: 05/13/2024 19:09   DG Chest 2 View Result Date: 05/13/2024 CLINICAL DATA:  Weakness. EXAM: CHEST - 2 VIEW COMPARISON:  Chest  radiograph dated 09/03/2023. FINDINGS: There is mild chronic interstitial coarsening and bronchitic changes. No focal consolidation, pleural effusion, or pneumothorax. The cardiac silhouette is within normal limits. No acute osseous pathology. IMPRESSION: No active cardiopulmonary disease. Electronically Signed   By: Vanetta Chou M.D.   On: 05/13/2024 16:26   PROCEDURES: Critical Care performed: No Procedures MEDICATIONS ORDERED IN ED: Medications  heparin  bolus via infusion 4,000 Units (has no administration in time range)  heparin  ADULT infusion 100 units/mL (25000 units/250mL) (has no administration in time range)  nitroGLYCERIN  (NITROSTAT ) SL tablet 0.4 mg (has no administration in time range)  acetaminophen  (TYLENOL ) tablet 650 mg (has no administration in time range)  ondansetron  (ZOFRAN ) injection 4 mg (has no administration in time range)  0.9 %  sodium chloride  infusion (has no administration in time range)  atorvastatin  (LIPITOR) tablet 80 mg (has no administration in time range)  aspirin  chewable tablet 324 mg (has no administration in time range)    Or  aspirin  suppository 300 mg (has no administration in time range)  aspirin  EC tablet 81 mg (has no administration in time range)  morphine  (PF) 2 MG/ML injection 2 mg (has no administration in time range)  magnesium  hydroxide (MILK OF MAGNESIA) suspension 30 mL (has no administration in time range)  traZODone  (DESYREL ) tablet 25 mg (has no administration in time range)  lactated ringers  bolus 1,000 mL (0 mLs Intravenous Stopped 05/13/24 1813)  bacitracin  ointment (1 Application Topical Given 05/13/24 2057)   IMPRESSION / MDM / ASSESSMENT AND PLAN / ED COURSE  I reviewed the triage vital signs and the nursing notes.                             The patient is on the cardiac monitor to evaluate for evidence of arrhythmia and/or significant heart rate changes. Patient's presentation is most consistent with acute presentation with  potential threat to life or bodily function. Patient presents for worsening weakness and multiple falls over the last week with intermittent shortness of breath and chest pain Workup: ECG, CXR, CBC, BMP, Troponin Findings: ECG: No overt evidence of STEMI. No evidence of Brugada's sign, delta wave, epsilon wave, significantly prolonged QTc, or malignant arrhythmia HS Troponin: Negative x1 Other Labs unremarkable for emergent problems. CXR: Without PTX, PNA, or widened mediastinum HEART Score: 6  Given History, Exam, and Workup I have low suspicion for ACS, Pneumothorax, Pneumonia, Pulmonary Embolus, Tamponade, Aortic Dissection or other emergent problem as a cause for this presentation.  High Risk Chest Pain Patient at increased risk for Major Adverse Cardiac Event (AMI, PCI, CABG, death) Interventions: ASA 324mg  Defer Heparin  drip as patient pain free at this time,   Disposition: Admit for continued cardiac monitoring and trending of  troponins as well as further evaluation for potential inpatient stress testing vs cardiac catheterization and coronary angiography.   FINAL CLINICAL IMPRESSION(S) / ED DIAGNOSES   Final diagnoses:  Fall in home, initial encounter  Abrasion of knee, unspecified laterality, initial encounter  Periorbital ecchymosis, unspecified laterality, initial encounter  Elevated troponin   Rx / DC Orders   ED Discharge Orders     None      Note:  This document was prepared using Dragon voice recognition software and may include unintentional dictation errors.   Ayodele Hartsock K, MD 05/13/24 [phone]

## 2024-05-13 NOTE — Assessment & Plan Note (Signed)
-   This could be due to ischemic heart disease and MI. - PT consult to be obtained to assess his ambulation. - Will follow neurochecks every 4 hours for 24 hours given his history of CVA.

## 2024-05-13 NOTE — Progress Notes (Signed)
 PHARMACY - ANTICOAGULATION CONSULT NOTE  Pharmacy Consult for Heparin  Infusion Indication: chest pain/ACS  No Known Allergies  Patient Measurements: Height: 5' 7 (170.2 cm) Weight: 83 kg (183 lb) IBW/kg (Calculated) : 66.1 HEPARIN  DW (KG): 82.7  Vital Signs: Temp: 98.4 F (36.9 C) (08/07 1538) Temp Source: Oral (08/07 1538) BP: 121/83 (08/07 1900) Pulse Rate: 109 (08/07 1900)  Labs: Recent Labs    05/13/24 1547 05/13/24 1814  HGB 13.4  --   HCT 39.0  --   PLT 175  --   LABPROT 14.3  --   INR 1.0  --   CREATININE 1.62*  --   TROPONINIHS 161* 404*    Estimated Creatinine Clearance: 40 mL/min (A) (by C-G formula based on SCr of 1.62 mg/dL (H)).   Medical History: Past Medical History:  Diagnosis Date   Arterial vascular disease 10/24/2014   Overview:  MI 1994, small-vessel disease requiring coumadin    Asthma    Centrilobular emphysema (HCC) 10/24/2014   Overview:  2L O2 prn    Centrilobular emphysema (HCC)    Chronic obstructive pulmonary disease (HCC) 11/06/2015   Coronary artery disease    GERD (gastroesophageal reflux disease)    Glaucoma    Heart attack (HCC) 2/17, 1994   Heart disease    Hyperlipemia    Hypertension    Restless leg 10/24/2014   Sleep apnea     Assessment: Patient is a 78 yo male presenting to ED after a fall. Troponin is trending up. Pharmacy consulted for Heparin  dosing for ACS/STEMI. No prior anticoagulants noted in history.  Goal of Therapy:  Heparin  level 0.3-0.7 units/ml Monitor platelets by anticoagulation protocol: Yes   Plan:  Give 4000 units bolus x 1 Start heparin  infusion at 1100 units/hr Check anti-Xa level in 8 hours and daily while on heparin  Continue to monitor H&H and platelets  Bradley Bowers, PharmD, BCPS 05/13/2024 7:45 PM

## 2024-05-13 NOTE — Assessment & Plan Note (Signed)
-   Will continue Aricept .

## 2024-05-13 NOTE — Assessment & Plan Note (Signed)
-   Will continue his inhalers while holding off long-acting beta agonist.

## 2024-05-13 NOTE — H&P (Signed)
 [place]   PATIENT NAME: Bradley Bowers    MR#:  [id number]  DATE OF BIRTH:  [birth year]  DATE OF ADMISSION:  05/13/2024  PRIMARY CARE PHYSICIAN: Cleotilde Oneil FALCON, MD   Patient is coming from: Home  REQUESTING/REFERRING PHYSICIAN: Jossie Birmingham, MD  CHIEF COMPLAINT:   Chief Complaint  Patient presents with  . Fall    HISTORY OF PRESENT ILLNESS:  [name] is a 78 y.o. Caucasian male with medical history significant for PAD, emphysema, GERD, coronary artery disease, paroxysmal atrial fibrillation, CHF, GERD, hypertension dyslipidemia, OSA and restless leg syndrome, who presented to the emergency room with acute onset of generalized weakness and falls over the last week.  The patient has been having intermittent shortness of breath and chest pain during the same time.  He was hypoxic and was placed on 3 L of O2 via nasal cannula in triage in the ED.  He has abrasions of both knees when he slipped to the floor from his chair.  No paresthesias or focal muscle weakness.  No head injuries.  He denied any cough or wheezing or hemoptysis.  No fever or chills.  No dysuria, oliguria or hematuria or flank pain.  ED Course: When the patient came to the ER, B heart rate was 114 and respiratory rate was 22 with pulse currently 87% on room air and 95% on 3 L of O2 by nasal cannula with otherwise normal vital signs.  Labs revealed a BUN of 25 with creatinine 1.62 compared to 22/1.04 on 09/05/2023.  High-sensitivity troponin I was 161 and later 404.  WBCs were 14.5.  PT and INR were normal. EKG as reviewed by me : EKG showed atrial fibrillation with rapid ventricular sponsor 102 with PVCs. Imaging: 2 view chest x-ray showed no acute cardiopulmonary disease.  X-ray of both knees came back negative.  The patient was given IV heparin  bolus and drip as well as 1 L bolus of IV lactated ringer , and bacitracin  to his knee abrasions.  Dr. Florencio was notified about the patient and is aware.  The  patient will be admitted to a progressive unit bed for further evaluation and management.SABRA PAST MEDICAL HISTORY:   Past Medical History:  Diagnosis Date  . Arterial vascular disease 10/24/2014   Overview:  MI 1994, small-vessel disease requiring coumadin   . Asthma   . Centrilobular emphysema (HCC) 10/24/2014   Overview:  2L O2 prn   . Centrilobular emphysema (HCC)   . Chronic obstructive pulmonary disease (HCC) 11/06/2015  . Coronary artery disease   . GERD (gastroesophageal reflux disease)   . Glaucoma   . Heart attack (HCC) 2/17, 1994  . Heart disease   . Hyperlipemia   . Hypertension   . Restless leg 10/24/2014  . Sleep apnea     PAST SURGICAL HISTORY:   Past Surgical History:  Procedure Laterality Date  . CATARACT EXTRACTION W/ INTRAOCULAR LENS  IMPLANT, BILATERAL    . COLONOSCOPY WITH PROPOFOL  N/A 02/19/2018   Procedure: COLONOSCOPY WITH PROPOFOL ;  Surgeon: Gaylyn Gladis PENNER, MD;  Location: [facility] ENDOSCOPY;  Service: Endoscopy;  Laterality: N/A;  . ESOPHAGOGASTRODUODENOSCOPY (EGD) WITH PROPOFOL  N/A 02/19/2018   Procedure: ESOPHAGOGASTRODUODENOSCOPY (EGD) WITH PROPOFOL ;  Surgeon: Gaylyn Gladis PENNER, MD;  Location: [facility] ENDOSCOPY;  Service: Endoscopy;  Laterality: N/A;  . EYE SURGERY      SOCIAL HISTORY:   Social History   Tobacco Use  . Smoking status: Former    Current packs/day: 0.00  Average packs/day: 1 pack/day for 30.0 years (30.0 ttl pk-yrs)    Types: Cigarettes    Start date: 03/07/1964    Quit date: 03/07/1994    Years since quitting: 30.2  . Smokeless tobacco: Never  Substance Use Topics  . Alcohol use: No    Alcohol/week: 0.0 standard drinks of alcohol    FAMILY HISTORY:   Family History  Problem Relation Age of Onset  . Cancer Mother   . Nephrolithiasis Father   . Prostate cancer Neg Hx   . Bladder Cancer Neg Hx   . Kidney cancer Neg Hx     DRUG ALLERGIES:  No Known Allergies  REVIEW OF SYSTEMS:   ROS As per history of present illness.  All pertinent systems were reviewed above. Constitutional, HEENT, cardiovascular, respiratory, GI, GU, musculoskeletal, neuro, psychiatric, endocrine, integumentary and hematologic systems were reviewed and are otherwise negative/unremarkable except for positive findings mentioned above in the HPI.   MEDICATIONS AT HOME:   Prior to Admission medications   Medication Sig Start Date End Date Taking? Authorizing Provider  albuterol  (VENTOLIN  HFA) 108 (90 Base) MCG/ACT inhaler Inhale 1-2 puffs into the lungs every 4 (four) hours as needed.    [provider]  aspirin  81 MG EC tablet Take 81 mg by mouth daily. 11/14/15   [provider]  atorvastatin  (LIPITOR) 80 MG tablet Take 80 mg by mouth daily.    [provider]  carbamazepine  (TEGRETOL ) 200 MG tablet Take 1 tablet by mouth 2 (two) times daily. 01/12/21   [provider]  cholecalciferol  (VITAMIN D3) 25 MCG (1000 UNIT) tablet Take 1,000 Units by mouth daily.    [provider]  cyanocobalamin  (VITAMIN B12) 1000 MCG tablet Take 1,000 mcg by mouth daily.    [provider]  diltiazem  (CARDIZEM  CD) 120 MG 24 hr capsule Take 120 mg by mouth daily.    [provider]  divalproex  (DEPAKOTE ) 250 MG DR tablet Take 250 mg by mouth daily. 08/16/23   [provider]  donepezil  (ARICEPT ) 10 MG tablet Take 1 tablet by mouth at bedtime. 07/11/22   [provider]  feeding supplement (ENSURE ENLIVE / ENSURE PLUS) LIQD Take 237 mLs by mouth 2 (two) times daily between meals. 10/05/22   Akula, Vijaya, MD  fluticasone  (FLONASE ) 50 MCG/ACT nasal spray Place 1 spray into both nostrils daily.    [provider]  mometasone -formoterol  (DULERA ) 100-5 MCG/ACT AERO Inhale 2 puffs into the lungs 2 (two) times daily. 10/04/22   Akula, Vijaya, MD  montelukast  (SINGULAIR ) 10 MG tablet Take 10 mg by mouth at bedtime.    [provider]  pantoprazole  (PROTONIX ) 40 MG tablet Take 40 mg  by mouth 2 (two) times daily. 07/11/22   [provider]  polyethylene glycol (MIRALAX  / GLYCOLAX ) 17 g packet Take 17 g by mouth daily as needed. 10/04/22   Akula, Vijaya, MD  spironolactone  (ALDACTONE ) 25 MG tablet Take 1 tablet by mouth daily. 07/11/22   [provider]  timolol  (TIMOPTIC ) 0.5 % ophthalmic solution 1 drop 2 (two) times daily.    [provider]  tiotropium (SPIRIVA ) 18 MCG inhalation capsule Place 18 mcg into inhaler and inhale daily.    [provider]  TRAVATAN Z 0.004 % SOLN ophthalmic solution Place 1 drop into both eyes at bedtime. 09/16/22   [provider]      VITAL SIGNS:  Blood pressure 109/67, pulse 80, temperature 98.8 F (37.1 C), resp. rate 17, height  5' 7 (1.702 m), weight 83 kg, SpO2 100%.  PHYSICAL EXAMINATION:  Physical Exam  GENERAL:  78 y.o.-year-old Caucasian male patient lying in the bed with no acute distress.  EYES: Pupils equal, round, reactive to light and accommodation. No scleral icterus. Extraocular muscles intact.  HEENT: Head atraumatic, normocephalic. Oropharynx and nasopharynx clear.  NECK:  Supple, no jugular venous distention. No thyroid  enlargement, no tenderness.  LUNGS: Normal breath sounds bilaterally, no wheezing, rales,rhonchi or crepitation. No use of accessory muscles of respiration.  CARDIOVASCULAR: Regular rate and rhythm, S1, S2 normal. No murmurs, rubs, or gallops.  ABDOMEN: Soft, nondistended, nontender. Bowel sounds present. No organomegaly or mass.  EXTREMITIES: No pedal edema, cyanosis, or clubbing.  NEUROLOGIC: Cranial nerves II through XII are intact. Muscle strength 5/5 in all extremities. Sensation intact. Gait not checked.  PSYCHIATRIC: The patient is alert and oriented x 3.  Normal affect and good eye contact. SKIN: Bilateral knee abrasions with no other rash or lesions or ulcers LABORATORY PANEL:   CBC Recent Labs  Lab 05/13/24 1547  WBC 14.5*  HGB 13.4  HCT  39.0  PLT 175   ------------------------------------------------------------------------------------------------------------------  Chemistries  Recent Labs  Lab 05/13/24 1547  NA 137  K 3.6  CL 99  CO2 24  GLUCOSE 170*  BUN 25*  CREATININE 1.62*  CALCIUM  9.1   ------------------------------------------------------------------------------------------------------------------  Cardiac Enzymes No results for input(s): TROPONINI in the last 168 hours. ------------------------------------------------------------------------------------------------------------------  RADIOLOGY:  CT Cervical Spine Wo Contrast Result Date: 05/13/2024 CLINICAL DATA:  Weakness, multiple falls EXAM: CT CERVICAL SPINE WITHOUT CONTRAST TECHNIQUE: Multidetector CT imaging of the cervical spine was performed without intravenous contrast. Multiplanar CT image reconstructions were also generated. RADIATION DOSE REDUCTION: This exam was performed according to the departmental dose-optimization program which includes automated exposure control, adjustment of the mA and/or kV according to patient size and/or use of iterative reconstruction technique. COMPARISON:  None Available. FINDINGS: Alignment: Alignment is grossly anatomic. Skull base and vertebrae: No acute fracture. No primary bone lesion or focal pathologic process. Soft tissues and spinal canal: No prevertebral fluid or swelling. No visible canal hematoma. Disc levels: Multilevel spondylosis and facet hypertrophy, with disc space narrowing greatest at C3-4 and from C5-6 through C7-T1. Upper chest: Airway is patent. Mild emphysematous changes at the lung apices. Other: Reconstructed images demonstrate no additional findings. IMPRESSION: 1. No acute cervical spine fracture. 2. Multilevel cervical degenerative changes as above. Electronically Signed   By: Ozell Daring M.D.   On: 05/13/2024 19:35   CT Head Wo Contrast Result Date: 05/13/2024 CLINICAL DATA:   Weakness, recent falls EXAM: CT HEAD WITHOUT CONTRAST TECHNIQUE: Contiguous axial images were obtained from the base of the skull through the vertex without intravenous contrast. RADIATION DOSE REDUCTION: This exam was performed according to the departmental dose-optimization program which includes automated exposure control, adjustment of the mA and/or kV according to patient size and/or use of iterative reconstruction technique. COMPARISON:  09/03/2023 FINDINGS: Brain: No acute infarct or hemorrhage. Stable chronic small vessel ischemic changes within the right thalamus and periventricular white matter. Lateral ventricles and midline structures are stable. No acute extra-axial fluid collections. No mass effect. Vascular: No hyperdense vessel or unexpected calcification. Skull: Normal. Negative for fracture or focal lesion. Sinuses/Orbits: Mild soft tissue swelling over the nasal bridge. The paranasal sinuses are clear. Other: None. IMPRESSION: 1. Soft tissue swelling over the nasal bridge. No displaced fracture. 2. No acute intracranial process. Electronically Signed   By: Ozell Daring HERO.D.  On: 05/13/2024 19:31   DG Knee Left Port Result Date: 05/13/2024 CLINICAL DATA:  Fall with significant bilateral knee pain. EXAM: PORTABLE LEFT KNEE - 1-2 VIEW; PORTABLE RIGHT KNEE - 1-2 VIEW COMPARISON:  None Available. FINDINGS: No evidence of fracture, dislocation, or joint effusion. No evidence of arthropathy or other focal bone abnormality. Soft tissues are unremarkable. IMPRESSION: Negative. Electronically Signed   By: Norman Gatlin M.D.   On: 05/13/2024 19:09   DG Knee Right Port Result Date: 05/13/2024 CLINICAL DATA:  Fall with significant bilateral knee pain. EXAM: PORTABLE LEFT KNEE - 1-2 VIEW; PORTABLE RIGHT KNEE - 1-2 VIEW COMPARISON:  None Available. FINDINGS: No evidence of fracture, dislocation, or joint effusion. No evidence of arthropathy or other focal bone abnormality. Soft tissues are unremarkable.  IMPRESSION: Negative. Electronically Signed   By: Norman Gatlin M.D.   On: 05/13/2024 19:09   DG Chest 2 View Result Date: 05/13/2024 CLINICAL DATA:  Weakness. EXAM: CHEST - 2 VIEW COMPARISON:  Chest radiograph dated 09/03/2023. FINDINGS: There is mild chronic interstitial coarsening and bronchitic changes. No focal consolidation, pleural effusion, or pneumothorax. The cardiac silhouette is within normal limits. No acute osseous pathology. IMPRESSION: No active cardiopulmonary disease. Electronically Signed   By: Vanetta Chou M.D.   On: 05/13/2024 16:26      IMPRESSION AND PLAN:  Assessment and Plan: * NSTEMI (non-ST elevated myocardial infarction) [facility]) - The patient will be admitted to a progressive unit bed. - Will continue IV heparin . - Will continue aspirin  and Plavix . - Will start a small dose of beta-blocker with Lopressor . - High-dose statin therapy will be given and fasting lipids will be checked. - 2D echo and cardiology consult to be obtained. - Dr. Florencio was notified about the patient.  Paroxysmal atrial fibrillation with RVR (HCC) - The patient will be on IV heparin . - Will add small dose beta-blocker as mentioned above. - Will continue Cardizem  CD.  Recurrent falls - This could be due to ischemic heart disease and MI. - PT consult to be obtained to assess his ambulation. - Will follow neurochecks every 4 hours for 24 hours given his history of CVA.  Essential hypertension - Will continue his antihypertensive therapy.  Bipolar 1 disorder (HCC) - Will continue his Depakote  ER and Tegretol .  Chronic obstructive pulmonary disease (COPD) (HCC) - Will continue his inhalers while holding off long-acting beta agonist.  GERD without esophagitis - Will continue PPI therapy.  Glaucoma - Will continue his ophthalmic gtt.    DVT prophylaxis: IV heparin . Advanced Care Planning:  Code Status: full code. Family Communication:  The plan of care was discussed in  details with the patient (and family). I answered all questions. The patient agreed to proceed with the above mentioned plan. Further management will depend upon hospital course. Disposition Plan: Back to previous home environment Consults called: Cardiology. All the records are reviewed and case discussed with ED provider.  Status is: Inpatient  At the time of the admission, it appears that the appropriate admission status for this patient is inpatient.  This is judged to be reasonable and necessary in order to provide the required intensity of service to ensure the patient's safety given the presenting symptoms, physical exam findings and initial radiographic and laboratory data in the context of comorbid conditions.  The patient requires inpatient status due to high intensity of service, high risk of further deterioration and high frequency of surveillance required.  I certify that at the time of admission, it is  my clinical judgment that the patient will require inpatient hospital care extending more than 2 midnights.                            Dispo: The patient is from: Home              Anticipated d/c is to: Home              Patient currently is not medically stable to d/c.              Difficult to place patient: No  [place] DELENA Peaches M.D on 05/13/2024 at 10:10 PM  [facility]   From 7 PM-7 AM, contact night-coverage [email or web address]  CC: Primary care physician; Cleotilde Oneil FALCON, MD

## 2024-05-13 NOTE — Assessment & Plan Note (Signed)
-   This is likely prerenal. - The patient will be hydrated with IV normal saline. - Will avoid nephrotoxins. - Will follow BMP.

## 2024-05-13 NOTE — Assessment & Plan Note (Signed)
-   The patient will be admitted to a progressive unit bed. - Will continue IV heparin . - Will continue aspirin  and Plavix . - Will start a small dose of beta-blocker with Lopressor . - High-dose statin therapy will be given and fasting lipids will be checked. - 2D echo and cardiology consult to be obtained. - Dr. Florencio was notified about the patient.

## 2024-05-13 NOTE — Assessment & Plan Note (Addendum)
-   Will continue his Depakote  ER and Tegretol .

## 2024-05-13 NOTE — ED Notes (Signed)
 MD made aware troponin 161

## 2024-05-13 NOTE — ED Notes (Signed)
 MD made aware of troponin 404.

## 2024-05-13 NOTE — Assessment & Plan Note (Signed)
 Will continue PPI therapy.

## 2024-05-13 NOTE — ED Triage Notes (Addendum)
 Pt to triage via wheelchair.  Pt brought in via ems from home with weakness and recent falls.  Pt states he slid out of his chair onto the floor today.  Abrasions to both knees and left elbow.  Pt has abrasion to nose and brusing beneath both eyes from a fall 2-3 days ago.  No loc.  No n/v  no headache.  No blood thinners.  Pt reports feeling weak.  Pt alert  hx afib.  No chest pain or sob.  Pt on 3 liters oxygen  at home.    Pt placed on 3 liters [place] in triage.  Pt has multiple bruises all over body

## 2024-05-13 NOTE — H&P (Incomplete)
 [place]   PATIENT NAME: Bradley Bowers    MR#:  [id number]  DATE OF BIRTH:  [birth year]  DATE OF ADMISSION:  05/13/2024  PRIMARY CARE PHYSICIAN: Cleotilde Oneil FALCON, MD   Patient is coming from: Home  REQUESTING/REFERRING PHYSICIAN: Jossie Birmingham, MD  CHIEF COMPLAINT:   Chief Complaint  Patient presents with  . Fall    HISTORY OF PRESENT ILLNESS:  Bradley Bowers is a 78 y.o. Caucasian male with medical history significant for PAD, emphysema, GERD, coronary artery disease, paroxysmal atrial fibrillation, CHF, GERD, hypertension dyslipidemia, OSA and restless leg syndrome, who presented to the emergency room with acute onset of generalized weakness and falls over the last week.  The patient has been having intermittent shortness of breath and chest pain during the same time.  He was hypoxic and was placed on 3 L of O2 via nasal cannula in triage in the ED.  He has abrasions of both knees when he slipped to the floor from his chair.  No paresthesias or focal muscle weakness.  No head injuries.  He denied any cough or wheezing or hemoptysis.  No fever or chills.  No dysuria, oliguria or hematuria or flank pain.  ED Course: When the patient came to the ER, B heart rate was 114 and respiratory rate was 22 with pulse currently 87% on room air and 95% on 3 L of O2 by nasal cannula with otherwise normal vital signs.  Labs revealed a BUN of 25 with creatinine 1.62 compared to 22/1.04 on 09/05/2023.  High-sensitivity troponin I was 161 and later 404.  WBCs were 14.5.  PT and INR were normal. EKG as reviewed by me : EKG showed atrial fibrillation with rapid ventricular sponsor 102 with PVCs. Imaging: 2 view chest x-ray showed no acute cardiopulmonary disease.  X-ray of both knees came back negative.  The patient was given IV heparin  bolus and drip as well as 1 L bolus of IV lactated ringer , and bacitracin  to his knee abrasions.  Dr. Florencio was notified about the patient and is aware.  The  patient will be admitted to a progressive unit bed for further evaluation and management.SABRA PAST MEDICAL HISTORY:   Past Medical History:  Diagnosis Date  . Arterial vascular disease 10/24/2014   Overview:  MI 1994, small-vessel disease requiring coumadin   . Asthma   . Centrilobular emphysema (HCC) 10/24/2014   Overview:  2L O2 prn   . Centrilobular emphysema (HCC)   . Chronic obstructive pulmonary disease (HCC) 11/06/2015  . Coronary artery disease   . GERD (gastroesophageal reflux disease)   . Glaucoma   . Heart attack (HCC) 2/17, 1994  . Heart disease   . Hyperlipemia   . Hypertension   . Restless leg 10/24/2014  . Sleep apnea     PAST SURGICAL HISTORY:   Past Surgical History:  Procedure Laterality Date  . CATARACT EXTRACTION W/ INTRAOCULAR LENS  IMPLANT, BILATERAL    . COLONOSCOPY WITH PROPOFOL  N/A 02/19/2018   Procedure: COLONOSCOPY WITH PROPOFOL ;  Surgeon: Gaylyn Gladis PENNER, MD;  Location: [facility] ENDOSCOPY;  Service: Endoscopy;  Laterality: N/A;  . ESOPHAGOGASTRODUODENOSCOPY (EGD) WITH PROPOFOL  N/A 02/19/2018   Procedure: ESOPHAGOGASTRODUODENOSCOPY (EGD) WITH PROPOFOL ;  Surgeon: Gaylyn Gladis PENNER, MD;  Location: [facility] ENDOSCOPY;  Service: Endoscopy;  Laterality: N/A;  . EYE SURGERY      SOCIAL HISTORY:   Social History   Tobacco Use  . Smoking status: Former    Current packs/day: 0.00  Average packs/day: 1 pack/day for 30.0 years (30.0 ttl pk-yrs)    Types: Cigarettes    Start date: 03/07/1964    Quit date: 03/07/1994    Years since quitting: 30.2  . Smokeless tobacco: Never  Substance Use Topics  . Alcohol use: No    Alcohol/week: 0.0 standard drinks of alcohol    FAMILY HISTORY:   Family History  Problem Relation Age of Onset  . Cancer Mother   . Nephrolithiasis Father   . Prostate cancer Neg Hx   . Bladder Cancer Neg Hx   . Kidney cancer Neg Hx     DRUG ALLERGIES:  No Known Allergies  REVIEW OF SYSTEMS:   ROS As per history of present illness.  All pertinent systems were reviewed above. Constitutional, HEENT, cardiovascular, respiratory, GI, GU, musculoskeletal, neuro, psychiatric, endocrine, integumentary and hematologic systems were reviewed and are otherwise negative/unremarkable except for positive findings mentioned above in the HPI.   MEDICATIONS AT HOME:   Prior to Admission medications   Medication Sig Start Date End Date Taking? Authorizing Provider  albuterol  (VENTOLIN  HFA) 108 (90 Base) MCG/ACT inhaler Inhale 1-2 puffs into the lungs every 4 (four) hours as needed.    [provider]  aspirin  81 MG EC tablet Take 81 mg by mouth daily. 11/14/15   [provider]  atorvastatin  (LIPITOR) 80 MG tablet Take 80 mg by mouth daily.    [provider]  carbamazepine  (TEGRETOL ) 200 MG tablet Take 1 tablet by mouth 2 (two) times daily. 01/12/21   [provider]  cholecalciferol  (VITAMIN D3) 25 MCG (1000 UNIT) tablet Take 1,000 Units by mouth daily.    [provider]  cyanocobalamin  (VITAMIN B12) 1000 MCG tablet Take 1,000 mcg by mouth daily.    [provider]  diltiazem  (CARDIZEM  CD) 120 MG 24 hr capsule Take 120 mg by mouth daily.    [provider]  divalproex  (DEPAKOTE ) 250 MG DR tablet Take 250 mg by mouth daily. 08/16/23   [provider]  donepezil  (ARICEPT ) 10 MG tablet Take 1 tablet by mouth at bedtime. 07/11/22   [provider]  feeding supplement (ENSURE ENLIVE / ENSURE PLUS) LIQD Take 237 mLs by mouth 2 (two) times daily between meals. 10/05/22   Akula, Vijaya, MD  fluticasone  (FLONASE ) 50 MCG/ACT nasal spray Place 1 spray into both nostrils daily.    [provider]  mometasone -formoterol  (DULERA ) 100-5 MCG/ACT AERO Inhale 2 puffs into the lungs 2 (two) times daily. 10/04/22   Akula, Vijaya, MD  montelukast  (SINGULAIR ) 10 MG tablet Take 10 mg by mouth at bedtime.    [provider]  pantoprazole  (PROTONIX ) 40 MG tablet Take 40 mg  by mouth 2 (two) times daily. 07/11/22   [provider]  polyethylene glycol (MIRALAX  / GLYCOLAX ) 17 g packet Take 17 g by mouth daily as needed. 10/04/22   Akula, Vijaya, MD  spironolactone  (ALDACTONE ) 25 MG tablet Take 1 tablet by mouth daily. 07/11/22   [provider]  timolol  (TIMOPTIC ) 0.5 % ophthalmic solution 1 drop 2 (two) times daily.    [provider]  tiotropium (SPIRIVA ) 18 MCG inhalation capsule Place 18 mcg into inhaler and inhale daily.    [provider]  TRAVATAN Z 0.004 % SOLN ophthalmic solution Place 1 drop into both eyes at bedtime. 09/16/22   [provider]      VITAL SIGNS:  Blood pressure 109/67, pulse 80, temperature 98.8 F (37.1 C), resp. rate 17, height  5' 7 (1.702 m), weight 83 kg, SpO2 100%.  PHYSICAL EXAMINATION:  Physical Exam  GENERAL:  78 y.o.-year-old Caucasian male patient lying in the bed with no acute distress.  EYES: Pupils equal, round, reactive to light and accommodation. No scleral icterus. Extraocular muscles intact.  HEENT: Head atraumatic, normocephalic. Oropharynx and nasopharynx clear.  NECK:  Supple, no jugular venous distention. No thyroid  enlargement, no tenderness.  LUNGS: Normal breath sounds bilaterally, no wheezing, rales,rhonchi or crepitation. No use of accessory muscles of respiration.  CARDIOVASCULAR: Regular rate and rhythm, S1, S2 normal. No murmurs, rubs, or gallops.  ABDOMEN: Soft, nondistended, nontender. Bowel sounds present. No organomegaly or mass.  EXTREMITIES: No pedal edema, cyanosis, or clubbing.  NEUROLOGIC: Cranial nerves II through XII are intact. Muscle strength 5/5 in all extremities. Sensation intact. Gait not checked.  PSYCHIATRIC: The patient is alert and oriented x 3.  Normal affect and good eye contact. SKIN: Bilateral knee abrasions with no other rash or lesions or ulcers LABORATORY PANEL:   CBC Recent Labs  Lab 05/13/24 1547  WBC 14.5*  HGB 13.4  HCT  39.0  PLT 175   ------------------------------------------------------------------------------------------------------------------  Chemistries  Recent Labs  Lab 05/13/24 1547  NA 137  K 3.6  CL 99  CO2 24  GLUCOSE 170*  BUN 25*  CREATININE 1.62*  CALCIUM  9.1   ------------------------------------------------------------------------------------------------------------------  Cardiac Enzymes No results for input(s): TROPONINI in the last 168 hours. ------------------------------------------------------------------------------------------------------------------  RADIOLOGY:  CT Cervical Spine Wo Contrast Result Date: 05/13/2024 CLINICAL DATA:  Weakness, multiple falls EXAM: CT CERVICAL SPINE WITHOUT CONTRAST TECHNIQUE: Multidetector CT imaging of the cervical spine was performed without intravenous contrast. Multiplanar CT image reconstructions were also generated. RADIATION DOSE REDUCTION: This exam was performed according to the departmental dose-optimization program which includes automated exposure control, adjustment of the mA and/or kV according to patient size and/or use of iterative reconstruction technique. COMPARISON:  None Available. FINDINGS: Alignment: Alignment is grossly anatomic. Skull base and vertebrae: No acute fracture. No primary bone lesion or focal pathologic process. Soft tissues and spinal canal: No prevertebral fluid or swelling. No visible canal hematoma. Disc levels: Multilevel spondylosis and facet hypertrophy, with disc space narrowing greatest at C3-4 and from C5-6 through C7-T1. Upper chest: Airway is patent. Mild emphysematous changes at the lung apices. Other: Reconstructed images demonstrate no additional findings. IMPRESSION: 1. No acute cervical spine fracture. 2. Multilevel cervical degenerative changes as above. Electronically Signed   By: Ozell Daring M.D.   On: 05/13/2024 19:35   CT Head Wo Contrast Result Date: 05/13/2024 CLINICAL DATA:   Weakness, recent falls EXAM: CT HEAD WITHOUT CONTRAST TECHNIQUE: Contiguous axial images were obtained from the base of the skull through the vertex without intravenous contrast. RADIATION DOSE REDUCTION: This exam was performed according to the departmental dose-optimization program which includes automated exposure control, adjustment of the mA and/or kV according to patient size and/or use of iterative reconstruction technique. COMPARISON:  09/03/2023 FINDINGS: Brain: No acute infarct or hemorrhage. Stable chronic small vessel ischemic changes within the right thalamus and periventricular white matter. Lateral ventricles and midline structures are stable. No acute extra-axial fluid collections. No mass effect. Vascular: No hyperdense vessel or unexpected calcification. Skull: Normal. Negative for fracture or focal lesion. Sinuses/Orbits: Mild soft tissue swelling over the nasal bridge. The paranasal sinuses are clear. Other: None. IMPRESSION: 1. Soft tissue swelling over the nasal bridge. No displaced fracture. 2. No acute intracranial process. Electronically Signed   By: Ozell Daring HERO.D.  On: 05/13/2024 19:31   DG Knee Left Port Result Date: 05/13/2024 CLINICAL DATA:  Fall with significant bilateral knee pain. EXAM: PORTABLE LEFT KNEE - 1-2 VIEW; PORTABLE RIGHT KNEE - 1-2 VIEW COMPARISON:  None Available. FINDINGS: No evidence of fracture, dislocation, or joint effusion. No evidence of arthropathy or other focal bone abnormality. Soft tissues are unremarkable. IMPRESSION: Negative. Electronically Signed   By: Norman Gatlin M.D.   On: 05/13/2024 19:09   DG Knee Right Port Result Date: 05/13/2024 CLINICAL DATA:  Fall with significant bilateral knee pain. EXAM: PORTABLE LEFT KNEE - 1-2 VIEW; PORTABLE RIGHT KNEE - 1-2 VIEW COMPARISON:  None Available. FINDINGS: No evidence of fracture, dislocation, or joint effusion. No evidence of arthropathy or other focal bone abnormality. Soft tissues are unremarkable.  IMPRESSION: Negative. Electronically Signed   By: Norman Gatlin M.D.   On: 05/13/2024 19:09   DG Chest 2 View Result Date: 05/13/2024 CLINICAL DATA:  Weakness. EXAM: CHEST - 2 VIEW COMPARISON:  Chest radiograph dated 09/03/2023. FINDINGS: There is mild chronic interstitial coarsening and bronchitic changes. No focal consolidation, pleural effusion, or pneumothorax. The cardiac silhouette is within normal limits. No acute osseous pathology. IMPRESSION: No active cardiopulmonary disease. Electronically Signed   By: Vanetta Chou M.D.   On: 05/13/2024 16:26      IMPRESSION AND PLAN:  Assessment and Plan: * NSTEMI (non-ST elevated myocardial infarction) [facility]) - The patient will be admitted to a progressive unit bed. - Will continue IV heparin . - Will continue aspirin  and Plavix . - Will start a small dose of beta-blocker with Lopressor . - High-dose statin therapy will be given and fasting lipids will be checked. - 2D echo and cardiology consult to be obtained. - Dr. Florencio was notified about the patient.  Paroxysmal atrial fibrillation with RVR (HCC) - The patient will be on IV heparin . - Will add small dose beta-blocker as mentioned above. - Will continue Cardizem  CD.  Recurrent falls - This could be due to ischemic heart disease and MI. - PT consult to be obtained to assess his ambulation. - Will follow neurochecks every 4 hours for 24 hours given his history of CVA.  Essential hypertension - Will continue his antihypertensive therapy.  Bipolar 1 disorder (HCC) - Will continue his Depakote  ER and Tegretol .  Chronic obstructive pulmonary disease (COPD) (HCC) - Will continue his inhalers while holding off long-acting beta agonist.  GERD without esophagitis - Will continue PPI therapy.  Glaucoma - Will continue his ophthalmic gtt.    DVT prophylaxis: IV heparin . Advanced Care Planning:  Code Status: full code. Family Communication:  The plan of care was discussed in  details with the patient (and family). I answered all questions. The patient agreed to proceed with the above mentioned plan. Further management will depend upon hospital course. Disposition Plan: Back to previous home environment Consults called: Cardiology. All the records are reviewed and case discussed with ED provider.  Status is: Inpatient  At the time of the admission, it appears that the appropriate admission status for this patient is inpatient.  This is judged to be reasonable and necessary in order to provide the required intensity of service to ensure the patient's safety given the presenting symptoms, physical exam findings and initial radiographic and laboratory data in the context of comorbid conditions.  The patient requires inpatient status due to high intensity of service, high risk of further deterioration and high frequency of surveillance required.  I certify that at the time of admission, it is  my clinical judgment that the patient will require inpatient hospital care extending more than 2 midnights.                            Dispo: The patient is from: Home              Anticipated d/c is to: Home              Patient currently is not medically stable to d/c.              Difficult to place patient: No  [place] DELENA Peaches M.D on 05/13/2024 at 10:10 PM  [facility]   From 7 PM-7 AM, contact night-coverage [email or web address]  CC: Primary care physician; Cleotilde Oneil FALCON, MD

## 2024-05-13 NOTE — Assessment & Plan Note (Signed)
-   Will continue his ophthalmic gtt.

## 2024-05-13 NOTE — Assessment & Plan Note (Signed)
-   Will continue his antihypertensive therapy.

## 2024-05-13 NOTE — Assessment & Plan Note (Signed)
-   The patient will be on IV heparin . - Will add small dose beta-blocker as mentioned above. - Will continue Cardizem  CD.

## 2024-05-14 ENCOUNTER — Other Ambulatory Visit (HOSPITAL_COMMUNITY): Payer: Self-pay

## 2024-05-14 ENCOUNTER — Telehealth (HOSPITAL_COMMUNITY): Payer: Self-pay | Admitting: Pharmacy Technician

## 2024-05-14 ENCOUNTER — Inpatient Hospital Stay
Admit: 2024-05-14 | Discharge: 2024-05-14 | Disposition: A | Discharge status: Home / Self Care | Attending: Family Medicine | Admitting: Family Medicine

## 2024-05-14 DIAGNOSIS — I10 Essential (primary) hypertension: Secondary | ICD-10-CM | POA: Diagnosis not present

## 2024-05-14 DIAGNOSIS — I4719 Other supraventricular tachycardia: Secondary | ICD-10-CM | POA: Diagnosis not present

## 2024-05-14 DIAGNOSIS — S80219A Abrasion, unspecified knee, initial encounter: Secondary | ICD-10-CM | POA: Diagnosis not present

## 2024-05-14 DIAGNOSIS — I2489 Other forms of acute ischemic heart disease: Secondary | ICD-10-CM | POA: Diagnosis not present

## 2024-05-14 DIAGNOSIS — I214 Non-ST elevation (NSTEMI) myocardial infarction: Secondary | ICD-10-CM | POA: Diagnosis not present

## 2024-05-14 DIAGNOSIS — W19XXXA Unspecified fall, initial encounter: Secondary | ICD-10-CM | POA: Diagnosis not present

## 2024-05-14 DIAGNOSIS — F319 Bipolar disorder, unspecified: Secondary | ICD-10-CM | POA: Diagnosis not present

## 2024-05-14 DIAGNOSIS — I480 Paroxysmal atrial fibrillation: Secondary | ICD-10-CM | POA: Diagnosis not present

## 2024-05-14 DIAGNOSIS — R7989 Other specified abnormal findings of blood chemistry: Secondary | ICD-10-CM | POA: Diagnosis not present

## 2024-05-14 DIAGNOSIS — R531 Weakness: Secondary | ICD-10-CM | POA: Diagnosis not present

## 2024-05-14 DIAGNOSIS — Z9861 Coronary angioplasty status: Secondary | ICD-10-CM | POA: Diagnosis not present

## 2024-05-14 DIAGNOSIS — J438 Other emphysema: Secondary | ICD-10-CM | POA: Diagnosis not present

## 2024-05-14 DIAGNOSIS — R296 Repeated falls: Secondary | ICD-10-CM | POA: Diagnosis not present

## 2024-05-14 DIAGNOSIS — E785 Hyperlipidemia, unspecified: Secondary | ICD-10-CM | POA: Diagnosis not present

## 2024-05-14 DIAGNOSIS — Y92009 Unspecified place in unspecified non-institutional (private) residence as the place of occurrence of the external cause: Secondary | ICD-10-CM | POA: Diagnosis not present

## 2024-05-14 LAB — BASIC METABOLIC PANEL WITH GFR
Anion gap: 9 (ref 5–15)
BUN: 24 mg/dL — ABNORMAL HIGH (ref 8–23)
CO2: 28 mmol/L (ref 22–32)
Calcium: 8.4 mg/dL — ABNORMAL LOW (ref 8.9–10.3)
Chloride: 102 mmol/L (ref 98–111)
Creatinine, Ser: 1.19 mg/dL (ref 0.61–1.24)
GFR, Estimated: >60 mL/min (ref >60)
Glucose, Bld: 78 mg/dL (ref 70–99)
Potassium: 3.8 mmol/L (ref 3.5–5.1)
Sodium: 139 mmol/L (ref 135–145)

## 2024-05-14 LAB — LIPID PANEL
Cholesterol: 106 mg/dL (ref 0–200)
HDL: 37 mg/dL — ABNORMAL LOW (ref >40)
LDL Cholesterol: 55 mg/dL (ref 0–99)
Total CHOL/HDL Ratio: 2.9 ratio
Triglycerides: 72 mg/dL (ref <150)
VLDL: 14 mg/dL (ref 0–40)

## 2024-05-14 LAB — CBC
HCT: 35.3 % — ABNORMAL LOW (ref 39.0–52.0)
Hemoglobin: 11.5 g/dL — ABNORMAL LOW (ref 13.0–17.0)
MCH: 29.7 pg (ref 26.0–34.0)
MCHC: 32.6 g/dL (ref 30.0–36.0)
MCV: 91.2 fL (ref 80.0–100.0)
Platelets: 117 K/uL — ABNORMAL LOW (ref 150–400)
RBC: 3.87 MIL/uL — ABNORMAL LOW (ref 4.22–5.81)
RDW: 13 % (ref 11.5–15.5)
WBC: 7.1 K/uL (ref 4.0–10.5)
nRBC: 0 % (ref 0.0–0.2)

## 2024-05-14 LAB — MRSA NEXT GEN BY PCR, NASAL: MRSA by PCR Next Gen: NOT DETECTED

## 2024-05-14 LAB — TROPONIN I (HIGH SENSITIVITY): Troponin I (High Sensitivity): 395 ng/L (ref <18)

## 2024-05-14 LAB — HEPARIN LEVEL (UNFRACTIONATED)
Heparin Unfractionated: 0.52 [IU]/mL (ref 0.30–0.70)
Heparin Unfractionated: 0.48 [IU]/mL (ref 0.30–0.70)

## 2024-05-14 LAB — PLATELET COUNT: Platelets: 116 K/uL — ABNORMAL LOW (ref 150–400)

## 2024-05-14 MED ORDER — ORAL CARE MOUTH RINSE: 15.0000 mL | OROMUCOSAL | Status: DC | PRN

## 2024-05-14 NOTE — Progress Notes (Signed)
*  PRELIMINARY RESULTS* Echocardiogram 2D Echocardiogram has been performed.  Bradley Bowers 05/14/2024, 2:02 PM

## 2024-05-14 NOTE — Plan of Care (Signed)

## 2024-05-14 NOTE — Telephone Encounter (Signed)
 Patient Product/process development scientist completed.    The patient is insured through [employer]. Patient has Medicare and is not eligible for a copay card, but may be able to apply for patient assistance or Medicare RX Payment Plan (Patient Must reach out to their plan, if eligible for payment plan), if available.    Ran test claim for Eliquis 5 mg and the current 30 day co-pay is $102.46 due to a $250.00 deductible.  Will be $47.00 once deductible is met.   This test claim was processed through [facility]- copay amounts may vary at other pharmacies due to pharmacy/plan contracts, or as the patient moves through the different stages of their insurance plan.     Reyes Sharps, CPHT Pharmacy Technician III Certified Patient Advocate [facility] Patient Advocate Team Direct Number: [phone]  Fax: [phone]

## 2024-05-14 NOTE — Consult Note (Addendum)
 [facility] CLINIC CARDIOLOGY CONSULT NOTE       Patient ID: Bradley ATKISON MRN: [id number] DOB/AGE: May 02, 1946 78 y.o.  Admit date: 05/13/2024 Referring Physician Dr. Lawence Primary Physician Cleotilde Oneil FALCON, MD Primary Cardiologist Dr. Bosie [phone]) Reason for Consultation elevated troponins  HPI: Bradley Bowers is a 78 y.o. male  with a past medical history of coronary artery disease s/p stent RCA (2017), hypertension, hyperlipidemia, paroxysmal atrial fibrillation, hx 2x CVA,  COPD, PAD, OSA, restless leg syndrome to who presented to the ED on 05/13/2024 for worsening generalized weakness and recurrent falls for the past 2 weeks.  Troponins found to be elevated.  Patient denies history of heart failure.  Cardiology was consulted for further evaluation.   Patient presented to the ED with worsening generalized weakness that has led to recurrent falls for the past 2 weeks. Work up in the ED notable for sodium 137, potassium 3.6, creatinine 1.62, hemoglobin 13.4, platelets 175.  EKG with atrial fibrillation, rate 102 bpm without acute ischemic changes.  Troponins elevated and flat 160 > 400 > 530 > 390.  Lipid panel within normal limits, LDL of 55.  BNP elevated at 680.  CXR with mild pulmonary vascular congestion. Patient was placed on 3L of O2 due to hypoxia. Patient given aspirin  324 mg and started on IV heparin  infusion due to elevated troponins.  At the time of my evaluation this AM, patient was resting comfortably in hospital bed.  We discussed patient's symptoms in further detail.  Patient states for the past 2 weeks he has noticed worsening weakness and has fallen about 3 times.  Patient states he keeps falling due to generalized weakness and not able to get up due to his arm weakness.  Patient denies any chest pain/pressure, shortness of breath, lightheadedness/dizziness or syncope.  Patient states prior to his LHC and stent placement in 2017 he had severe chest pressure that and knows what it  feels like.  Patient states he has not had any chest pressure/pain since 2017.  Patient states he has some shortness of breath when he has to walk with his walker however patient attributes this to his COPD.  Patient states he has a history of atrial fibrillation and used to take warfarin for 23 years however reports that was stopped 2 years ago.  Patient states he used to take Plavix  as well and that was stopped a few years ago.  Patient says he only takes aspirin  as a blood thinning medication.   Review of systems complete and found to be negative unless listed above    Past Medical History:  Diagnosis Date   Arterial vascular disease 10/24/2014   Overview:  MI 1994, small-vessel disease requiring coumadin    Asthma    Centrilobular emphysema (HCC) 10/24/2014   Overview:  2L O2 prn    Centrilobular emphysema (HCC)    Chronic obstructive pulmonary disease (HCC) 11/06/2015   Coronary artery disease    GERD (gastroesophageal reflux disease)    Glaucoma    Heart attack (HCC) 2/17, 1994   Heart disease    Hyperlipemia    Hypertension    Restless leg 10/24/2014   Sleep apnea     Past Surgical History:  Procedure Laterality Date   CATARACT EXTRACTION W/ INTRAOCULAR LENS  IMPLANT, BILATERAL     COLONOSCOPY WITH PROPOFOL  N/A 02/19/2018   Procedure: COLONOSCOPY WITH PROPOFOL ;  Surgeon: Gaylyn Gladis PENNER, MD;  Location: [facility] ENDOSCOPY;  Service: Endoscopy;  Laterality: N/A;   ESOPHAGOGASTRODUODENOSCOPY (  EGD) WITH PROPOFOL  N/A 02/19/2018   Procedure: ESOPHAGOGASTRODUODENOSCOPY (EGD) WITH PROPOFOL ;  Surgeon: Gaylyn Gladis PENNER, MD;  Location: [facility] ENDOSCOPY;  Service: Endoscopy;  Laterality: N/A;   EYE SURGERY      Medications Prior to Admission  Medication Sig Dispense Refill Last Dose/Taking   albuterol  (VENTOLIN  HFA) 108 (90 Base) MCG/ACT inhaler Inhale 1-2 puffs into the lungs every 4 (four) hours as needed.   Taking As Needed   aspirin  81 MG EC tablet Take 81 mg by mouth daily.  11 Taking    atorvastatin  (LIPITOR) 80 MG tablet Take 80 mg by mouth daily.   Taking   benzonatate (TESSALON) 200 MG capsule Take 200 mg by mouth 3 (three) times daily as needed for cough.   Taking As Needed   carbamazepine  (TEGRETOL ) 200 MG tablet Take 1 tablet by mouth 2 (two) times daily.   Taking   cholecalciferol  (VITAMIN D3) 25 MCG (1000 UNIT) tablet Take 1,000 Units by mouth daily.   Taking   clopidogrel  (PLAVIX ) 75 MG tablet Take 75 mg by mouth daily.   Taking   cyanocobalamin  (VITAMIN B12) 1000 MCG tablet Take 1,000 mcg by mouth daily.   Taking   diltiazem  (CARDIZEM  CD) 120 MG 24 hr capsule Take 120 mg by mouth daily.   Taking   divalproex  (DEPAKOTE  ER) 250 MG 24 hr tablet Take 500 mg by mouth daily.   Taking   donepezil  (ARICEPT ) 10 MG tablet Take 1 tablet by mouth at bedtime.   Taking   fluticasone  (FLONASE ) 50 MCG/ACT nasal spray Place 1 spray into both nostrils daily as needed for allergies.   Taking As Needed   fluticasone -salmeterol (ADVAIR) 100-50 MCG/ACT AEPB Inhale 1 puff into the lungs 2 (two) times daily.   Taking   guaiFENesin  (MUCINEX ) 600 MG 12 hr tablet Take 600 mg by mouth 2 (two) times daily as needed.   Taking As Needed   latanoprost  (XALATAN ) 0.005 % ophthalmic solution Place 1 drop into both eyes at bedtime.   Taking   mometasone -formoterol  (DULERA ) 100-5 MCG/ACT AERO Inhale 2 puffs into the lungs 2 (two) times daily. 1 each  Taking   montelukast  (SINGULAIR ) 10 MG tablet Take 10 mg by mouth at bedtime.   Taking   OLANZapine  (ZYPREXA ) 5 MG tablet Take 5 mg by mouth at bedtime.   Taking   pantoprazole  (PROTONIX ) 40 MG tablet Take 40 mg by mouth 2 (two) times daily.   Taking   timolol  (TIMOPTIC ) 0.5 % ophthalmic solution 1 drop daily.   Taking   tiotropium (SPIRIVA ) 18 MCG inhalation capsule Place 18 mcg into inhaler and inhale daily.   Taking   TRAVATAN Z 0.004 % SOLN ophthalmic solution Place 1 drop into both eyes at bedtime.   Taking   divalproex  (DEPAKOTE ) 250 MG DR tablet Take  250 mg by mouth daily. (Patient not taking: Reported on 05/13/2024)   Not Taking   feeding supplement (ENSURE ENLIVE / ENSURE PLUS) LIQD Take 237 mLs by mouth 2 (two) times daily between meals. 237 mL 12    polyethylene glycol (MIRALAX  / GLYCOLAX ) 17 g packet Take 17 g by mouth daily as needed. (Patient not taking: Reported on 05/13/2024) 14 each 0 Not Taking   spironolactone  (ALDACTONE ) 25 MG tablet Take 1 tablet by mouth daily. (Patient not taking: Reported on 05/13/2024)   Not Taking   Social History   Socioeconomic History   Marital status: Married    Spouse name: Not on file   Number  of children: Not on file   Years of education: Not on file   Highest education level: Not on file  Occupational History   Not on file  Tobacco Use   Smoking status: Former    Current packs/day: 0.00    Average packs/day: 1 pack/day for 30.0 years (30.0 ttl pk-yrs)    Types: Cigarettes    Start date: 03/07/1964    Quit date: 03/07/1994    Years since quitting: 30.2   Smokeless tobacco: Never  Vaping Use   Vaping status: Never Used  Substance and Sexual Activity   Alcohol use: No    Alcohol/week: 0.0 standard drinks of alcohol   Drug use: No   Sexual activity: Not Currently  Other Topics Concern   Not on file  Social History Narrative   Not on file   Social Drivers of Health   Financial Resource Strain: Low Risk  (04/30/2023)   Received from [facility]   Overall Financial Resource Strain (CARDIA)    Difficulty of Paying Living Expenses: Not hard at all  Food Insecurity: No Food Insecurity (05/13/2024)   Hunger Vital Sign    Worried About Running Out of Food in the Last Year: Never true    Ran Out of Food in the Last Year: Never true  Transportation Needs: No Transportation Needs (05/13/2024)   PRAPARE - Administrator, Civil Service (Medical): No    Lack of Transportation (Non-Medical): No  Physical Activity: Not on file  Stress: Not on file  Social Connections:  Moderately Integrated (05/13/2024)   Social Connection and Isolation Panel    Frequency of Communication with Friends and Family: More than three times a week    Frequency of Social Gatherings with Friends and Family: More than three times a week    Attends Religious Services: More than 4 times per year    Active Member of Clubs or Organizations: No    Attends Banker Meetings: Patient declined    Marital Status: Married  Catering manager Violence: Not At Risk (05/13/2024)   Humiliation, Afraid, Rape, and Kick questionnaire    Fear of Current or Ex-Partner: No    Emotionally Abused: No    Physically Abused: No    Sexually Abused: No    Family History  Problem Relation Age of Onset   Cancer Mother    Nephrolithiasis Father    Prostate cancer Neg Hx    Bladder Cancer Neg Hx    Kidney cancer Neg Hx      Vitals:   05/13/24 2124 05/14/24 0429 05/14/24 0728 05/14/24 1117  BP: 109/67 119/69 122/79 127/73  Pulse: 80 77 89 89  Resp: 17 18 18 19   Temp: 98.8 F (37.1 C) 98.2 F (36.8 C) 97.9 F (36.6 C) 98.2 F (36.8 C)  TempSrc:      SpO2: 100% 100% 100% 98%  Weight:      Height:        PHYSICAL EXAM General: Chronically ill-appearing elderly male, well nourished, in no acute distress. HEENT: Normocephalic and atraumatic. Neck: No JVD.   Lungs: Normal respiratory effort on 3L. Clear bilaterally to auscultation. No wheezes, crackles, rhonchi.  Heart: Regular, irregular, controlled rate. Normal S1 and S2 without gallops or murmurs.  Abdomen: Non-distended appearing.  Msk: Normal strength and tone for age. Extremities: Warm and well perfused. No clubbing, cyanosis, edema.  Neuro: Alert and oriented X 3. Psych: Answers questions appropriately.   Labs: Basic Metabolic Panel: Recent  Labs    05/13/24 1547 05/14/24 0512  NA 137 139  K 3.6 3.8  CL 99 102  CO2 24 28  GLUCOSE 170* 78  BUN 25* 24*  CREATININE 1.62* 1.19  CALCIUM  9.1 8.4*   Liver Function  Tests: No results for input(s): AST, ALT, ALKPHOS, BILITOT, PROT, ALBUMIN in the last 72 hours. No results for input(s): LIPASE, AMYLASE in the last 72 hours. CBC: Recent Labs    05/13/24 1547 05/14/24 0512  WBC 14.5* 7.1  HGB 13.4 11.5*  HCT 39.0 35.3*  MCV 89.4 91.2  PLT 175 117*   Cardiac Enzymes: Recent Labs    05/13/24 1814 05/13/24 2141 05/14/24 1036  TROPONINIHS 404* 533* 395*   BNP: No results for input(s): BNP in the last 72 hours. D-Dimer: No results for input(s): DDIMER in the last 72 hours. Hemoglobin A1C: No results for input(s): HGBA1C in the last 72 hours. Fasting Lipid Panel: Recent Labs    05/14/24 0512  CHOL 106  HDL 37*  LDLCALC 55  TRIG 72  CHOLHDL 2.9   Thyroid  Function Tests: No results for input(s): TSH, T4TOTAL, T3FREE, THYROIDAB in the last 72 hours.  Invalid input(s): FREET3 Anemia Panel: No results for input(s): VITAMINB12, FOLATE, FERRITIN, TIBC, IRON, RETICCTPCT in the last 72 hours.   Radiology: CT Cervical Spine Wo Contrast Result Date: 05/13/2024 CLINICAL DATA:  Weakness, multiple falls EXAM: CT CERVICAL SPINE WITHOUT CONTRAST TECHNIQUE: Multidetector CT imaging of the cervical spine was performed without intravenous contrast. Multiplanar CT image reconstructions were also generated. RADIATION DOSE REDUCTION: This exam was performed according to the departmental dose-optimization program which includes automated exposure control, adjustment of the mA and/or kV according to patient size and/or use of iterative reconstruction technique. COMPARISON:  None Available. FINDINGS: Alignment: Alignment is grossly anatomic. Skull base and vertebrae: No acute fracture. No primary bone lesion or focal pathologic process. Soft tissues and spinal canal: No prevertebral fluid or swelling. No visible canal hematoma. Disc levels: Multilevel spondylosis and facet hypertrophy, with disc space narrowing greatest at  C3-4 and from C5-6 through C7-T1. Upper chest: Airway is patent. Mild emphysematous changes at the lung apices. Other: Reconstructed images demonstrate no additional findings. IMPRESSION: 1. No acute cervical spine fracture. 2. Multilevel cervical degenerative changes as above. Electronically Signed   By: Ozell Daring M.D.   On: 05/13/2024 19:35   CT Head Wo Contrast Result Date: 05/13/2024 CLINICAL DATA:  Weakness, recent falls EXAM: CT HEAD WITHOUT CONTRAST TECHNIQUE: Contiguous axial images were obtained from the base of the skull through the vertex without intravenous contrast. RADIATION DOSE REDUCTION: This exam was performed according to the departmental dose-optimization program which includes automated exposure control, adjustment of the mA and/or kV according to patient size and/or use of iterative reconstruction technique. COMPARISON:  09/03/2023 FINDINGS: Brain: No acute infarct or hemorrhage. Stable chronic small vessel ischemic changes within the right thalamus and periventricular white matter. Lateral ventricles and midline structures are stable. No acute extra-axial fluid collections. No mass effect. Vascular: No hyperdense vessel or unexpected calcification. Skull: Normal. Negative for fracture or focal lesion. Sinuses/Orbits: Mild soft tissue swelling over the nasal bridge. The paranasal sinuses are clear. Other: None. IMPRESSION: 1. Soft tissue swelling over the nasal bridge. No displaced fracture. 2. No acute intracranial process. Electronically Signed   By: Ozell Daring M.D.   On: 05/13/2024 19:31   DG Knee Left Port Result Date: 05/13/2024 CLINICAL DATA:  Fall with significant bilateral knee pain. EXAM: PORTABLE LEFT KNEE -  1-2 VIEW; PORTABLE RIGHT KNEE - 1-2 VIEW COMPARISON:  None Available. FINDINGS: No evidence of fracture, dislocation, or joint effusion. No evidence of arthropathy or other focal bone abnormality. Soft tissues are unremarkable. IMPRESSION: Negative. Electronically  Signed   By: Norman Gatlin M.D.   On: 05/13/2024 19:09   DG Knee Right Port Result Date: 05/13/2024 CLINICAL DATA:  Fall with significant bilateral knee pain. EXAM: PORTABLE LEFT KNEE - 1-2 VIEW; PORTABLE RIGHT KNEE - 1-2 VIEW COMPARISON:  None Available. FINDINGS: No evidence of fracture, dislocation, or joint effusion. No evidence of arthropathy or other focal bone abnormality. Soft tissues are unremarkable. IMPRESSION: Negative. Electronically Signed   By: Norman Gatlin M.D.   On: 05/13/2024 19:09   DG Chest 2 View Result Date: 05/13/2024 CLINICAL DATA:  Weakness. EXAM: CHEST - 2 VIEW COMPARISON:  Chest radiograph dated 09/03/2023. FINDINGS: There is mild chronic interstitial coarsening and bronchitic changes. No focal consolidation, pleural effusion, or pneumothorax. The cardiac silhouette is within normal limits. No acute osseous pathology. IMPRESSION: No active cardiopulmonary disease. Electronically Signed   By: Vanetta Chou M.D.   On: 05/13/2024 16:26    ECHO ordered.  TELEMETRY reviewed by me 05/14/2024: Atrial fibrillation, rate 80-90s  EKG reviewed by me:  atrial fibrillation, rate 102 bpm without acute ischemic changes.   Data reviewed by me 05/14/2024: last 24h vitals tele labs imaging I/O ED provider note, admission H&P.  Principal Problem:   NSTEMI (non-ST elevated myocardial infarction) (HCC) Active Problems:   Chronic obstructive pulmonary disease (COPD) (HCC)   Glaucoma   Bipolar 1 disorder (HCC)   Essential hypertension   AKI (acute kidney injury) (HCC)   Paroxysmal atrial fibrillation with RVR (HCC)   GERD without esophagitis   Recurrent falls   Cognitive dysfunction    ASSESSMENT AND PLAN:  Bradley Bowers is a 78 y.o. male  with a past medical history of coronary artery disease s/p stent RCA (2017), hypertension, hyperlipidemia, paroxysmal atrial fibrillation, COPD, PAD, OSA, restless leg syndrome to who presented to the ED on 05/13/2024 for worsening  generalized weakness and recurrent falls for the past 2 weeks.  Troponins found to be elevated.  Patient denies history of heart failure.  Cardiology was consulted for further evaluation.   # Recurrent Falls # Generalized Weakness -Management per primary team -Recommend PT as patient is deconditioned. -Recommend adequate hydration.  # COPD -Management per primary team.   # Coronary artery disease s/p stent to RCA (2017) # Demand ischemia # Hypertension # Hyperlipidemia Patient without chest pain. EKG with atrial fibrillation, rate 102 bpm without acute ischemic changes.  Troponins elevated and flat 160 > 400 > 530 > 390.  Lipid panel within normal limits, LDL of 55.  -Echo ordered.  Further recommendations pending results. -Continue aspirin  81 mg, atorvastatin  80 mg daily. -Continue metoprolol  as stated below. -Patient's BP well-controlled.  If BP becomes elevated will initiate losartan. -No chest pain, elevated and flat troponins is most consistent with demand/supply mismatch and not ACS   # Paroxysmal atrial fibrillation EKG and telemetry with atrial fibrillation, controlled rates.  Patient states he has not been on any blood thinning medication for many years for atrial fibrillation.  Patient states he used to be on warfarin for 23 years but that was stopped by a provider when he was started on Plavix  in 2017.  Patient states he only takes aspirin  at this time. -Monitor and replenish electrolytes for a goal K >4, Mag >2  -Continue IV  heparin  infusion for stroke risk reduction. -CHA2DS2VASc score of at least 6 (in the setting of age, HTN, Stroke history, vascular disease). Would recommend starting anticoagulation for stroke risk reduction. (Copay is $102.46 due to a $250.00 deductible. $47.00 once deductible is met for Eliquis and Xarelto). Patient reports worsening recurrent falls and issues bleeding. HAS-BLED score is 5. Patient states he understands the risk of stroke if he's not on  AC. Patient states he will not go on Warfarin again. Patient states Eliquis/Xarelto is unsure if affordable for him and with patient at increased bleeding risk, patient wants to only take ASA and not AC at this time. Will continue to have discussions with patient regarding options.  -Continue home Cardizem  120 mg daily. -Continue metoprolol  tartrate 12.5 mg twice daily.  ADDENUM: Discussed with patient and Dr. Tobie discussed with wife regarding risk/benefits of being on Eliquis for stroke risk reduction. Wife and patient states they will make it work and would like to start Eliquis. Working with Pharmacy to get coupons for now to help make Eliquis more affordable for patient.   This patient's plan of care was discussed and created with Dr. Florencio and he is in agreement.  Signed: Dorene Comfort, PA-C  05/14/2024, 11:47 AM [facility] Cardiology

## 2024-05-14 NOTE — Care Management CC44 (Cosign Needed)
 Condition Code 44 Documentation Completed  Patient Details  Name: Bradley Bowers MRN: [id number] Date of Birth: 1946/03/18   Condition Code 44 given:    Patient signature on Condition Code 44 notice:    Documentation of 2 MD's agreement:    Code 44 added to claim:       Dalia GORMAN Fuse, RN 05/14/2024, 3:24 PM

## 2024-05-14 NOTE — Progress Notes (Addendum)
 [facility]  - [place] [facility]   PATIENT NAME: Bradley Bowers    MR#:  [id number]  DATE OF BIRTH:  1945-10-19  SUBJECTIVE:  no family at bedside. Patient seen earlier. Spoke with wife Bradley Bowers on the phone. Came in from home after he got up from the chair feeling tired legs gave out and fell causing abrasion on the knees. Denies any chest pain. Started on heparin  drip for elevated cardiac enzymes remains chest pain free. Found to be in a fib with RVR. Discussion led regarding eliquis, cost and patient being high risk for CVA given to strokes in the past. Patient has been off blood thinners for a while according to the wife and she is in agreement with the recommendation.    VITALS:  Blood pressure 127/73, pulse 89, temperature 98.2 F (36.8 C), resp. rate 19, height 5' 7 (1.702 m), weight 83 kg, SpO2 98%.  PHYSICAL EXAMINATION:   GENERAL:  78 y.o.-year-old patient with no acute distress.  LUNGS: Normal breath sounds bilaterally, no wheezing CARDIOVASCULAR: S1, S2 normal. No murmur  afib ABDOMEN: Soft, nontender, nondistended. Bowel sounds present.  EXTREMITIES: No  edema b/l. Bilateral knee mild abrasion   NEUROLOGIC: nonfocal  patient is alert and awake SKIN: per RN  LABORATORY PANEL:  CBC Recent Labs  Lab 05/14/24 0512 05/14/24 1326  WBC 7.1  --   HGB 11.5*  --   HCT 35.3*  --   PLT 117* 116*    Chemistries  Recent Labs  Lab 05/14/24 0512  NA 139  K 3.8  CL 102  CO2 28  GLUCOSE 78  BUN 24*  CREATININE 1.19  CALCIUM  8.4*   Cardiac Enzymes No results for input(s): TROPONINI in the last 168 hours. RADIOLOGY:  CT Cervical Spine Wo Contrast Result Date: 05/13/2024 CLINICAL DATA:  Weakness, multiple falls EXAM: CT CERVICAL SPINE WITHOUT CONTRAST TECHNIQUE: Multidetector CT imaging of the cervical spine was performed without intravenous contrast. Multiplanar CT image reconstructions were also generated. RADIATION DOSE REDUCTION: This exam was  performed according to the departmental dose-optimization program which includes automated exposure control, adjustment of the mA and/or kV according to patient size and/or use of iterative reconstruction technique. COMPARISON:  None Available. FINDINGS: Alignment: Alignment is grossly anatomic. Skull base and vertebrae: No acute fracture. No primary bone lesion or focal pathologic process. Soft tissues and spinal canal: No prevertebral fluid or swelling. No visible canal hematoma. Disc levels: Multilevel spondylosis and facet hypertrophy, with disc space narrowing greatest at C3-4 and from C5-6 through C7-T1. Upper chest: Airway is patent. Mild emphysematous changes at the lung apices. Other: Reconstructed images demonstrate no additional findings. IMPRESSION: 1. No acute cervical spine fracture. 2. Multilevel cervical degenerative changes as above. Electronically Signed   By: Ozell Daring M.D.   On: 05/13/2024 19:35   CT Head Wo Contrast Result Date: 05/13/2024 CLINICAL DATA:  Weakness, recent falls EXAM: CT HEAD WITHOUT CONTRAST TECHNIQUE: Contiguous axial images were obtained from the base of the skull through the vertex without intravenous contrast. RADIATION DOSE REDUCTION: This exam was performed according to the departmental dose-optimization program which includes automated exposure control, adjustment of the mA and/or kV according to patient size and/or use of iterative reconstruction technique. COMPARISON:  09/03/2023 FINDINGS: Brain: No acute infarct or hemorrhage. Stable chronic small vessel ischemic changes within the right thalamus and periventricular white matter. Lateral ventricles and midline structures are stable. No acute extra-axial fluid collections. No mass effect. Vascular: No hyperdense vessel or  unexpected calcification. Skull: Normal. Negative for fracture or focal lesion. Sinuses/Orbits: Mild soft tissue swelling over the nasal bridge. The paranasal sinuses are clear. Other: None.  IMPRESSION: 1. Soft tissue swelling over the nasal bridge. No displaced fracture. 2. No acute intracranial process. Electronically Signed   By: Ozell Daring M.D.   On: 05/13/2024 19:31   DG Knee Left Port Result Date: 05/13/2024 CLINICAL DATA:  Fall with significant bilateral knee pain. EXAM: PORTABLE LEFT KNEE - 1-2 VIEW; PORTABLE RIGHT KNEE - 1-2 VIEW COMPARISON:  None Available. FINDINGS: No evidence of fracture, dislocation, or joint effusion. No evidence of arthropathy or other focal bone abnormality. Soft tissues are unremarkable. IMPRESSION: Negative. Electronically Signed   By: Norman Gatlin M.D.   On: 05/13/2024 19:09   DG Knee Right Port Result Date: 05/13/2024 CLINICAL DATA:  Fall with significant bilateral knee pain. EXAM: PORTABLE LEFT KNEE - 1-2 VIEW; PORTABLE RIGHT KNEE - 1-2 VIEW COMPARISON:  None Available. FINDINGS: No evidence of fracture, dislocation, or joint effusion. No evidence of arthropathy or other focal bone abnormality. Soft tissues are unremarkable. IMPRESSION: Negative. Electronically Signed   By: Norman Gatlin M.D.   On: 05/13/2024 19:09   DG Chest 2 View Result Date: 05/13/2024 CLINICAL DATA:  Weakness. EXAM: CHEST - 2 VIEW COMPARISON:  Chest radiograph dated 09/03/2023. FINDINGS: There is mild chronic interstitial coarsening and bronchitic changes. No focal consolidation, pleural effusion, or pneumothorax. The cardiac silhouette is within normal limits. No acute osseous pathology. IMPRESSION: No active cardiopulmonary disease. Electronically Signed   By: Vanetta Chou M.D.   On: 05/13/2024 16:26    Assessment and Plan  Bradley Bowers is a 78 y.o. Caucasian male with medical history significant for PAD, emphysema, GERD, coronary artery disease, paroxysmal atrial fibrillation, CHF, GERD, hypertension dyslipidemia, OSA and restless leg syndrome, who presented to the emergency room with acute onset of generalized weakness and falls over the last week.  He was  hypoxic and was placed on 3 L of O2 via nasal cannula in triage in the ED. He has abrasions of both knees when he slipped to the floor from his chair.  Pt uses oxygen  at home chronically.  EKG showed atrial fibrillation with rapid ventricular sponsor 102 with PVCs. Imaging: 2 view chest x-ray showed no acute cardiopulmonary disease.   X-ray of both knees came back negative.  Recurrent falls due to generalized weakness Bilateral Knee abrasion -- patient is ambulatory home using walker. He has had a few mechanical falls. Came in yesterday after he could not get up after having a mechanical fall. He tells me he got tired sitting in the chair got up legs got weak and he fell down. Did not lose consciousness. -- PT OT to see patient.  Elevated troponin without chest pain H/o CAD s/p stent to RCA 2017 -- patient was started on IV heparin  drip. Patient denies any chest pain. -- Seen by cardiology no indication for invasive workup -- troponin trending down --cont asa, statin --pt stopped plavix  few years ago  A fib with RVR history of PAF H/o CVA x2 -- patient was on warfarin many years ago. He has history of stroke. Patient is not keen on taking anticoagulation. -- Price check on eliquis was expensive for patient. He reported to the cardiology PA he will not be able to afford it and he does not want to take warfarin.He understands his risk of CVA -- Patient understands the risk for stroke given he has had stroke  before. --d/w pt's wife Bradley Bowers on the phone and she acknowledges pt's high risk for CVA and ok with starting Eliquis per Cardiology recs and understands risk of bleeding. --pt and wife are aware of the cost. --pt takes only ASA. --d/c heparin  gtt  Addendum: after discussing with RPH--pt being on Carbamazepine  decreases the efficacy of DOAC. Pt does not want to do Warfarin so after d/w Cardiology PA will cont ASA for now (as before)  Chronic COPD --stable --chronic home  oxygen   Hypertension -- continue home  [place]  --continue PPI  Bipolar 1 disorder (HCC) -  continue his Depakote  ER and Tegretol .     Procedures: Family communication :Linda wife on the phone Consults :[facility] cardiology CODE STATUS: DNR DVT Prophylaxis :Eliquis Level of care: Progressive Status is: Inpatient Remains inpatient appropriate because: fall, afib with RVR, PT/OT to see pt    TOTAL TIME TAKING CARE OF THIS PATIENT: 40 minutes.  >50% time spent on counselling and coordination of care  Note: This dictation was prepared with Dragon dictation along with smaller phrase technology. Any transcriptional errors that result from this process are unintentional.  Leita Blanch M.D    [facility]   CC: Primary care physician; Cleotilde Oneil FALCON, MD

## 2024-05-14 NOTE — Care Management Important Message (Signed)
 Important Message  Patient Details  Name: Bradley Bowers MRN: [id number] Date of Birth: [birth year]   Important Message Given:  Yes - Medicare IM     [name] 05/14/2024, 1:31 PM

## 2024-05-14 NOTE — Evaluation (Signed)
 Physical Therapy Evaluation Patient Details Name: Bradley Bowers MRN: [id number] DOB: [birth year] Today's Date: 05/14/2024  History of Present Illness  Bradley Bowers is a 78 y.o. Caucasian male presented to the emergency room with acute onset of generalized weakness and falls over the last week. MD diagnoses include: NSTEMI, Paroxysmal atrial fibrillation with RVR (HCC), Recurrent falls, Essential hypertension, COPD, and GERD without esophagitis.  Clinical Impression  Pt is a pleasant 78 year old male who was admitted after experiencing a fall. Pt performs bed mobility with mod I and transfers, and ambulation with min to mod A. During ambulation, pt demonstrated 1 instance of LOB while turning to the chair to which PT provided min A to correct balance and prevent fall. Pt demonstrates deficits with strength, balance, and functional mobility.  SpO2 and HR stayed WNL throughout session while on 2.5L of O2. Would benefit from skilled PT to address above deficits and promote optimal return to PLOF.        If plan is discharge home, recommend the following: A little help with walking and/or transfers;A little help with bathing/dressing/bathroom;Assistance with cooking/housework;Assist for transportation;Help with stairs or ramp for entrance   Can travel by private vehicle   Yes    Equipment Recommendations    Recommendations for Other Services       Functional Status Assessment Patient has had a recent decline in their functional status and demonstrates the ability to make significant improvements in function in a reasonable and predictable amount of time.     Precautions / Restrictions Precautions Precautions: Fall Restrictions Weight Bearing Restrictions Per Provider Order: No      Mobility  Bed Mobility Overal bed mobility: Modified Independent             General bed mobility comments: Pt is slow to move and used bed rails to help him sit at EOB.    Transfers Overall  transfer level: Needs assistance Equipment used: Rolling walker (2 wheels) Transfers: Bed to chair/wheelchair/BSC     Step pivot transfers: Min assist, Mod assist, From elevated surface       General transfer comment: PT provided min-mod A during turn to the chair to prevent LOB    Ambulation/Gait Ambulation/Gait assistance: Min assist Gait Distance (Feet): 8 Feet Assistive device: Rolling walker (2 wheels) Gait Pattern/deviations: Step-to pattern, Trunk flexed, Narrow base of support       General Gait Details: Pt able to tolerate short distance though fatigues quickly  Stairs            Wheelchair Mobility     Tilt Bed    Modified Rankin (Stroke Patients Only)       Balance Overall balance assessment: Needs assistance Sitting-balance support: Feet supported Sitting balance-[name]: Good Sitting balance - Comments: Pt able to maintain balance at EOB   Standing balance support: Bilateral upper extremity supported, Reliant on assistive device for balance Standing balance-[name]: Poor Standing balance comment: Pt requires RW for balance                             Pertinent Vitals/Pain Pain Assessment Pain Assessment: No/denies pain    Home Living Family/patient expects to be discharged to:: Private residence Living Arrangements: Spouse/significant other Available Help at Discharge: Family;Friend(s) Type of Home: House Home Access: Stairs to enter Entrance Stairs-Rails: Right;Left;Can reach both Entrance Stairs-Number of Steps: 1   Home Layout: One level Home Equipment: Rollator (4 wheels);Tub bench;Cane -  quad Additional Comments: Pt uses 3L of O2 at home    Prior Function Prior Level of Function : Independent/Modified Independent;History of Falls (last six months)             Mobility Comments: Pt is household ambulator at baseline. Uses rollator in the home and [facility] in the community       Extremity/Trunk Assessment    Upper Extremity Assessment Upper Extremity Assessment: Defer to OT evaluation    Lower Extremity Assessment Lower Extremity Assessment: Overall WFL for tasks assessed;RLE deficits/detail RLE Deficits / Details: Pt reports RLE weaker than LLE s/p CVA in 2023       Communication   Communication Communication: No apparent difficulties    Cognition Arousal: Alert Behavior During Therapy: [facility] for tasks assessed/performed   PT - Cognitive impairments: No apparent impairments, No family/caregiver present to determine baseline                       PT - Cognition Comments: Pt is pleasant and agreeable to therapy. Following commands: Intact       Cueing Cueing Techniques: Verbal cues, Tactile cues     General Comments      Exercises Other Exercises Other Exercises: standing marches with RW Other Exercises: edu about safe practices during ambulation   Assessment/Plan    PT Assessment Patient needs continued PT services  PT Problem List Decreased activity tolerance;Decreased balance;Decreased mobility       PT Treatment Interventions DME instruction;Gait training;Functional mobility training;Stair training;Therapeutic activities;Therapeutic exercise;Patient/family education;Neuromuscular re-education    PT Goals (Current goals can be found in the Care Plan section)  Acute Rehab PT Goals Patient Stated Goal: get back to how I was PT Goal Formulation: With patient Time For Goal Achievement: 05/28/24 Potential to Achieve Goals: Good    Frequency Min 2X/week     Co-evaluation               AM-PAC PT 6 Clicks Mobility  Outcome Measure Help needed turning from your back to your side while in a flat bed without using bedrails?: A Little Help needed moving from lying on your back to sitting on the side of a flat bed without using bedrails?: A Little Help needed moving to and from a bed to a chair (including a wheelchair)?: A Little Help needed standing  up from a chair using your arms (e.g., wheelchair or bedside chair)?: A Little Help needed to walk in hospital room?: A Little Help needed climbing 3-5 steps with a railing? : A Lot 6 Click Score: 17    End of Session Equipment Utilized During Treatment: Gait belt Activity Tolerance: Patient tolerated treatment well Patient left: in chair;with call bell/phone within reach;with chair alarm set   PT Visit Diagnosis: Unsteadiness on feet (R26.81);Repeated falls (R29.6);History of falling (Z91.81)    Time: 8553-8481 PT Time Calculation (min) (ACUTE ONLY): 32 min   Charges:                 Parilee Hally, SPT  Ceirra Belli 05/14/2024, 5:40 PM

## 2024-05-14 NOTE — Progress Notes (Signed)
 PHARMACY - ANTICOAGULATION CONSULT NOTE  Pharmacy Consult for Heparin  Infusion Indication: chest pain/ACS  No Known Allergies  Patient Measurements: Height: 5' 7 (170.2 cm) Weight: 83 kg (183 lb) IBW/kg (Calculated) : 66.1 HEPARIN  DW (KG): 82.7  Vital Signs: Temp: 98.2 F (36.8 C) (08/08 0429) BP: 119/69 (08/08 0429) Pulse Rate: 77 (08/08 0429)  Labs: Recent Labs    05/13/24 1547 05/13/24 1814 05/13/24 2141 05/14/24 0512  HGB 13.4  --   --  11.5*  HCT 39.0  --   --  35.3*  PLT 175  --   --  117*  LABPROT 14.3  --   --   --   INR 1.0  --   --   --   HEPARINUNFRC  --   --   --  0.52  CREATININE 1.62*  --   --  1.19  TROPONINIHS 161* 404* 533*  --     Estimated Creatinine Clearance: 54.5 mL/min (by C-G formula based on SCr of 1.19 mg/dL).   Medical History: Past Medical History:  Diagnosis Date   Arterial vascular disease 10/24/2014   Overview:  MI 1994, small-vessel disease requiring coumadin    Asthma    Centrilobular emphysema (HCC) 10/24/2014   Overview:  2L O2 prn    Centrilobular emphysema (HCC)    Chronic obstructive pulmonary disease (HCC) 11/06/2015   Coronary artery disease    GERD (gastroesophageal reflux disease)    Glaucoma    Heart attack (HCC) 2/17, 1994   Heart disease    Hyperlipemia    Hypertension    Restless leg 10/24/2014   Sleep apnea     Assessment: Patient is a 78 yo male presenting to ED after a fall. Troponin is trending up. Pharmacy consulted for Heparin  dosing for ACS/STEMI. No prior anticoagulants noted in history.  Goal of Therapy:  Heparin  level 0.3-0.7 units/ml Monitor platelets by anticoagulation protocol: Yes  08/08 0512 HL 0.52, therapeutic x 1   Plan:  Continue heparin  infusion at 1100 units/hr Recheck HL in 8 hrs to confirm, then daily CBC daily while on heparin .  Rankin CANDIE Dills, PharmD, [facility] 05/14/2024 6:33 AM

## 2024-05-14 NOTE — TOC Initial Note (Signed)
 Transition of Care [facility]) - Initial/Assessment Note    Patient Details  Name: Bradley Bowers MRN: [id number] Date of Birth: [birth year]  Transition of Care [facility]) CM/SW Contact:    Lauraine JAYSON Carpen, LCSW Phone Number: 05/14/2024, 11:35 AM  Clinical Narrative:  Readmission prevention screen complete. CSW met with patient. No family at bedside. CSW introduced role and explained that discharge planning would be discussed. PCP is Oneil Pinal, MD. Wife had previously been taking him to appointments but she has broken her leg. He does have someone else that can take him to appointments. [facility] [place]. No issues obtaining medications. Patient lives home with his wife. No home health prior to admission. He has a rollator at home. He uses 3 L home oxygen  through [employer]. No further concerns. CSW will continue to follow patient for support and facilitate return home once stable. Lynwood Lenis will transport him home at discharge. CSW encourage him to remind Mr. Capps to bring his oxygen  for the ride.            Expected Discharge Plan: Home/Self Care Barriers to Discharge: Continued Medical Work up   Patient Goals and CMS Choice            Expected Discharge Plan and Services     Post Acute Care Choice: NA Living arrangements for the past 2 months: Single Family Home                                      Prior Living Arrangements/Services Living arrangements for the past 2 months: Single Family Home Lives with:: Spouse Patient language and need for interpreter reviewed:: Yes Do you feel safe going back to the place where you live?: Yes      Need for Family Participation in Patient Care: Yes (Comment) Care giver support system in place?: Yes (comment) Current home services: DME Criminal Activity/Legal Involvement Pertinent to Current Situation/Hospitalization: No - Comment as needed  Activities of Daily Living   ADL Screening (condition at time of  admission) Independently performs ADLs?: Yes (appropriate for developmental age) Is the patient deaf or have difficulty hearing?: No Does the patient have difficulty seeing, even when wearing glasses/contacts?: No Does the patient have difficulty concentrating, remembering, or making decisions?: No  Permission Sought/Granted                  Emotional Assessment Appearance:: Appears stated age Attitude/Demeanor/Rapport: Engaged, Gracious Affect (typically observed): Accepting, Appropriate, Calm, Pleasant Orientation: : Oriented to Self, Oriented to Place, Oriented to  Time, Oriented to Situation Alcohol / Substance Use: Not Applicable Psych Involvement: No (comment)  Admission diagnosis:  Elevated troponin [R79.89] NSTEMI (non-ST elevated myocardial infarction) (HCC) [I21.4] Fall in home, initial encounter [W19.XXXA, Y92.009] Abrasion of knee, unspecified laterality, initial encounter [S80.219A] Periorbital ecchymosis, unspecified laterality, initial encounter [S00.10XA] Patient Active Problem List   Diagnosis Date Noted   NSTEMI (non-ST elevated myocardial infarction) (HCC) 05/13/2024   Paroxysmal atrial fibrillation with RVR (HCC) 05/13/2024   GERD without esophagitis 05/13/2024   Recurrent falls 05/13/2024   Cognitive dysfunction 05/13/2024   Severe sepsis (HCC) 09/04/2023   Severe sepsis with acute organ dysfunction (HCC) 09/03/2023   Chronic respiratory failure with hypoxia (HCC) 09/03/2023   Expressive aphasia 09/03/2023   COVID-19 virus infection 09/24/2022   History of TIA (transient ischemic attack) 09/24/2022   Generalized weakness 09/23/2022   Acute metabolic encephalopathy 09/04/2022  Urinary tract infection in male 08/25/2022   AKI (acute kidney injury) (HCC) 08/25/2022   Parkinsonian features    CVA (cerebral vascular accident) (HCC) 01/31/2022   Essential hypertension 01/30/2022   Peripheral neuropathy 01/30/2022   TIA (transient ischemic attack)  01/29/2022   Acute respiratory failure with hypoxia (HCC) 05/22/2021   COPD with acute exacerbation (HCC) 05/22/2021   Hyperlipemia 05/22/2021   Bipolar 1 disorder (HCC) 05/22/2021   Renal insufficiency 05/22/2021   Leg edema 05/22/2021   Coronary artery disease 09/24/2016   Glaucoma 09/24/2016   Bipolar affective disorder, current episode manic with psychotic symptoms (HCC) 09/23/2016   Chronic obstructive pulmonary disease (COPD) (HCC) 11/06/2015   Gastro-esophageal reflux disease without esophagitis 10/24/2014   Obstructive apnea 10/24/2014   Restless leg 10/24/2014   PCP:  Cleotilde Oneil FALCON, MD Pharmacy:   [facility] [address],  - [address] [place] [address] Phone: [phone] Fax: [phone]     Social Drivers of Health (SDOH) Social History: SDOH Screenings   Food Insecurity: No Food Insecurity (05/13/2024)  Housing: Low Risk  (05/13/2024)  Transportation Needs: No Transportation Needs (05/13/2024)  Utilities: Not At Risk (05/13/2024)  Alcohol Screen: Low Risk  (08/16/2017)  Depression (PHQ2-9): Low Risk  (04/05/2021)  Financial Resource Strain: Low Risk  (04/30/2023)   Received from [facility]  Social Connections: Moderately Integrated (05/13/2024)  Tobacco Use: Medium Risk (05/13/2024)   SDOH Interventions:     Readmission Risk Interventions    05/14/2024   11:34 AM  Readmission Risk Prevention Plan  Transportation Screening Complete  PCP or Specialist Appt within 3-5 Days Complete  Social Work Consult for Recovery Care Planning/Counseling Complete  Palliative Care Screening Not Applicable  Medication Review Oceanographer) Complete

## 2024-05-15 DIAGNOSIS — I480 Paroxysmal atrial fibrillation: Secondary | ICD-10-CM | POA: Diagnosis not present

## 2024-05-15 DIAGNOSIS — R7989 Other specified abnormal findings of blood chemistry: Secondary | ICD-10-CM | POA: Diagnosis not present

## 2024-05-15 DIAGNOSIS — S80219A Abrasion, unspecified knee, initial encounter: Secondary | ICD-10-CM

## 2024-05-15 DIAGNOSIS — W19XXXA Unspecified fall, initial encounter: Secondary | ICD-10-CM | POA: Diagnosis not present

## 2024-05-15 DIAGNOSIS — Y92009 Unspecified place in unspecified non-institutional (private) residence as the place of occurrence of the external cause: Secondary | ICD-10-CM | POA: Diagnosis not present

## 2024-05-15 DIAGNOSIS — E785 Hyperlipidemia, unspecified: Secondary | ICD-10-CM | POA: Diagnosis not present

## 2024-05-15 DIAGNOSIS — R296 Repeated falls: Secondary | ICD-10-CM | POA: Diagnosis not present

## 2024-05-15 DIAGNOSIS — J438 Other emphysema: Secondary | ICD-10-CM

## 2024-05-15 DIAGNOSIS — I4891 Unspecified atrial fibrillation: Secondary | ICD-10-CM | POA: Diagnosis not present

## 2024-05-15 DIAGNOSIS — F319 Bipolar disorder, unspecified: Secondary | ICD-10-CM | POA: Diagnosis not present

## 2024-05-15 DIAGNOSIS — I2489 Other forms of acute ischemic heart disease: Secondary | ICD-10-CM | POA: Diagnosis not present

## 2024-05-15 DIAGNOSIS — I2510 Atherosclerotic heart disease of native coronary artery without angina pectoris: Secondary | ICD-10-CM | POA: Diagnosis not present

## 2024-05-15 DIAGNOSIS — I10 Essential (primary) hypertension: Secondary | ICD-10-CM | POA: Diagnosis not present

## 2024-05-15 LAB — ECHOCARDIOGRAM COMPLETE
AR max vel: 2.97 cm2
AV Area VTI: 2.57 cm2
AV Area mean vel: 2.63 cm2
AV Mean grad: 3 mmHg
AV Peak grad: 5.3 mmHg
Ao pk vel: 1.15 m/s
Area-P 1/2: 4.57 cm2
Height: 67 in
S' Lateral: 3.6 cm
Single Plane A4C EF: 23.3 %
Weight: 2928 [oz_av]

## 2024-05-15 LAB — CBC
HCT: 33.7 % — ABNORMAL LOW (ref 39.0–52.0)
Hemoglobin: 11.4 g/dL — ABNORMAL LOW (ref 13.0–17.0)
MCH: 30.5 pg (ref 26.0–34.0)
MCHC: 33.8 g/dL (ref 30.0–36.0)
MCV: 90.1 fL (ref 80.0–100.0)
Platelets: 106 K/uL — ABNORMAL LOW (ref 150–400)
RBC: 3.74 MIL/uL — ABNORMAL LOW (ref 4.22–5.81)
RDW: 13.2 % (ref 11.5–15.5)
WBC: 6.1 K/uL (ref 4.0–10.5)
nRBC: 0 % (ref 0.0–0.2)

## 2024-05-15 LAB — BASIC METABOLIC PANEL WITH GFR
Anion gap: 8 (ref 5–15)
BUN: 26 mg/dL — ABNORMAL HIGH (ref 8–23)
CO2: 28 mmol/L (ref 22–32)
Calcium: 8.5 mg/dL — ABNORMAL LOW (ref 8.9–10.3)
Chloride: 104 mmol/L (ref 98–111)
Creatinine, Ser: 1.24 mg/dL (ref 0.61–1.24)
GFR, Estimated: >60 mL/min (ref >60)
Glucose, Bld: 118 mg/dL — ABNORMAL HIGH (ref 70–99)
Potassium: 3.8 mmol/L (ref 3.5–5.1)
Sodium: 140 mmol/L (ref 135–145)

## 2024-05-15 NOTE — Plan of Care (Signed)
  Problem: Education: Goal: Understanding of cardiac disease, CV risk reduction, and recovery process will improve Outcome: Progressing   Problem: Education: Goal: Individualized Educational Video(s) Outcome: Progressing   Problem: Activity: Goal: Ability to tolerate increased activity will improve Outcome: Progressing

## 2024-05-15 NOTE — Progress Notes (Signed)
 Pt discharged to home with family via wheelchair. Oxygen  tank with roller given to patient to take home. Pt instructed on importance of using oxygen  at all times. Discharge instructions reviewed and all questions and concerns answered. PIV removed.

## 2024-05-15 NOTE — Progress Notes (Signed)
 [facility] CLINIC CARDIOLOGY PROGRESS NOTE       Patient ID: Bradley Bowers MRN: [id number] DOB/AGE: 76-Oct-1947 76 y.o.  Admit date: 05/13/2024 Referring Physician Dr. Lawence Primary Physician Cleotilde Oneil FALCON, MD Primary Cardiologist Dr. Bosie [phone]) Reason for Consultation elevated troponins  HPI: Bradley Bowers is a 78 y.o. male  with a past medical history of coronary artery disease s/p stent RCA (2017), hypertension, hyperlipidemia, paroxysmal atrial fibrillation, hx 2x CVA,  COPD, PAD, OSA, restless leg syndrome to who presented to the ED on 05/13/2024 for worsening generalized weakness and recurrent falls for the past 2 weeks.  Troponins found to be elevated.  Patient denies history of heart failure.  Cardiology was consulted for further evaluation.   Interval history: -Patient seen and examined this AM, feeling well without complaints of CP, SOB, palpitations.  -BP and heart rate remain relatively stable on current medications. -Patient again declines warfarin today, states that he understands the risk associated with not being on an anticoagulant for his atrial fibrillation.  Review of systems complete and found to be negative unless listed above    Past Medical History:  Diagnosis Date   Arterial vascular disease 10/24/2014   Overview:  MI 1994, small-vessel disease requiring coumadin    Asthma    Centrilobular emphysema (HCC) 10/24/2014   Overview:  2L O2 prn    Centrilobular emphysema (HCC)    Chronic obstructive pulmonary disease (HCC) 11/06/2015   Coronary artery disease    GERD (gastroesophageal reflux disease)    Glaucoma    Heart attack (HCC) 2/17, 1994   Heart disease    Hyperlipemia    Hypertension    Restless leg 10/24/2014   Sleep apnea     Past Surgical History:  Procedure Laterality Date   CATARACT EXTRACTION W/ INTRAOCULAR LENS  IMPLANT, BILATERAL     COLONOSCOPY WITH PROPOFOL  N/A 02/19/2018   Procedure: COLONOSCOPY WITH PROPOFOL ;  Surgeon: Gaylyn Gladis PENNER, MD;  Location: [facility] ENDOSCOPY;  Service: Endoscopy;  Laterality: N/A;   ESOPHAGOGASTRODUODENOSCOPY (EGD) WITH PROPOFOL  N/A 02/19/2018   Procedure: ESOPHAGOGASTRODUODENOSCOPY (EGD) WITH PROPOFOL ;  Surgeon: Gaylyn Gladis PENNER, MD;  Location[facility] ENDOSCOPY;  Service: Endoscopy;  Laterality: N/A;   EYE SURGERY      Medications Prior to Admission  Medication Sig Dispense Refill Last Dose/Taking   albuterol  (VENTOLIN  HFA) 108 (90 Base) MCG/ACT inhaler Inhale 1-2 puffs into the lungs every 4 (four) hours as needed.   Taking As Needed   aspirin  81 MG EC tablet Take 81 mg by mouth daily.  11 Taking   atorvastatin  (LIPITOR ) 80 MG tablet Take 80 mg by mouth daily.   Taking   benzonatate (TESSALON) 200 MG capsule Take 200 mg by mouth 3 (three) times daily as needed for cough.   Taking As Needed   carbamazepine  (TEGRETOL ) 200 MG tablet Take 1 tablet by mouth 2 (two) times daily.   Taking   cholecalciferol  (VITAMIN D3) 25 MCG (1000 UNIT) tablet Take 1,000 Units by mouth daily.   Taking   clopidogrel  (PLAVIX ) 75 MG tablet Take 75 mg by mouth daily.   Taking   cyanocobalamin  (VITAMIN B12) 1000 MCG tablet Take 1,000 mcg by mouth daily.   Taking   diltiazem  (CARDIZEM  CD) 120 MG 24 hr capsule Take 120 mg by mouth daily.   Taking   divalproex  (DEPAKOTE  ER) 250 MG 24 hr tablet Take 500 mg by mouth daily.   Taking   donepezil  (ARICEPT ) 10 MG tablet Take 1 tablet  by mouth at bedtime.   Taking   fluticasone  (FLONASE ) 50 MCG/ACT nasal spray Place 1 spray into both nostrils daily as needed for allergies.   Taking As Needed   fluticasone -salmeterol (ADVAIR) 100-50 MCG/ACT AEPB Inhale 1 puff into the lungs 2 (two) times daily.   Taking   guaiFENesin  (MUCINEX ) 600 MG 12 hr tablet Take 600 mg by mouth 2 (two) times daily as needed.   Taking As Needed   latanoprost  (XALATAN ) 0.005 % ophthalmic solution Place 1 drop into both eyes at bedtime.   Taking   mometasone -formoterol  (DULERA ) 100-5 MCG/ACT AERO Inhale 2  puffs into the lungs 2 (two) times daily. 1 each  Taking   montelukast  (SINGULAIR ) 10 MG tablet Take 10 mg by mouth at bedtime.   Taking   OLANZapine  (ZYPREXA ) 5 MG tablet Take 5 mg by mouth at bedtime.   Taking   pantoprazole  (PROTONIX ) 40 MG tablet Take 40 mg by mouth 2 (two) times daily.   Taking   timolol  (TIMOPTIC ) 0.5 % ophthalmic solution 1 drop daily.   Taking   tiotropium (SPIRIVA ) 18 MCG inhalation capsule Place 18 mcg into inhaler and inhale daily.   Taking   TRAVATAN Z 0.004 % SOLN ophthalmic solution Place 1 drop into both eyes at bedtime.   Taking   divalproex  (DEPAKOTE ) 250 MG DR tablet Take 250 mg by mouth daily. (Patient not taking: Reported on 05/13/2024)   Not Taking   feeding supplement (ENSURE ENLIVE / ENSURE PLUS) LIQD Take 237 mLs by mouth 2 (two) times daily between meals. 237 mL 12    polyethylene glycol (MIRALAX  / GLYCOLAX ) 17 g packet Take 17 g by mouth daily as needed. (Patient not taking: Reported on 05/13/2024) 14 each 0 Not Taking   spironolactone  (ALDACTONE ) 25 MG tablet Take 1 tablet by mouth daily. (Patient not taking: Reported on 05/13/2024)   Not Taking   Social History   Socioeconomic History   Marital status: Married    Spouse name: Not on file   Number of children: Not on file   Years of education: Not on file   Highest education level: Not on file  Occupational History   Not on file  Tobacco Use   Smoking status: Former    Current packs/day: 0.00    Average packs/day: 1 pack/day for 30.0 years (30.0 ttl pk-yrs)    Types: Cigarettes    Start date: 03/07/1964    Quit date: 03/07/1994    Years since quitting: 30.2   Smokeless tobacco: Never  Vaping Use   Vaping status: Never Used  Substance and Sexual Activity   Alcohol use: No    Alcohol/week: 0.0 standard drinks of alcohol   Drug use: No   Sexual activity: Not Currently  Other Topics Concern   Not on file  Social History Narrative   Not on file   Social Drivers of Health   Financial Resource  Strain: Low Risk  (04/30/2023)   Received from [facility]   Overall Financial Resource Strain (CARDIA)    Difficulty of Paying Living Expenses: Not hard at all  Food Insecurity: No Food Insecurity (05/13/2024)   Hunger Vital Sign    Worried About Running Out of Food in the Last Year: Never true    Ran Out of Food in the Last Year: Never true  Transportation Needs: No Transportation Needs (05/13/2024)   PRAPARE - Administrator, Civil Service (Medical): No    Lack of Transportation (Non-Medical):  No  Physical Activity: Not on file  Stress: Not on file  Social Connections: Moderately Integrated (05/13/2024)   Social Connection and Isolation Panel    Frequency of Communication with Friends and Family: More than three times a week    Frequency of Social Gatherings with Friends and Family: More than three times a week    Attends Religious Services: More than 4 times per year    Active Member of Clubs or Organizations: No    Attends Banker Meetings: Patient declined    Marital Status: Married  Catering manager Violence: Not At Risk (05/13/2024)   Humiliation, Afraid, Rape, and Kick questionnaire    Fear of Current or Ex-Partner: No    Emotionally Abused: No    Physically Abused: No    Sexually Abused: No    Family History  Problem Relation Age of Onset   Cancer Mother    Nephrolithiasis Father    Prostate cancer Neg Hx    Bladder Cancer Neg Hx    Kidney cancer Neg Hx      Vitals:   05/14/24 2023 05/14/24 2345 05/15/24 0408 05/15/24 0740  BP: 135/71 (!) 113/55 116/82 113/69  Pulse: 98 83 91 91  Resp: 18 18 18    Temp: 100.1 F (37.8 C) 98.2 F (36.8 C) (!) 97.4 F (36.3 C) 97.7 F (36.5 C)  TempSrc:  Oral    SpO2: 99% 98% 100% 100%  Weight:      Height:        PHYSICAL EXAM General: Chronically ill-appearing elderly male, well nourished, in no acute distress. HEENT: Normocephalic and atraumatic. Neck: No JVD.   Lungs: Normal  respiratory effort on 3L. Clear bilaterally to auscultation. No wheezes, crackles, rhonchi.  Heart: Irregularly irregular, controlled rate. Normal S1 and S2 without gallops or murmurs.  Abdomen: Non-distended appearing.  Msk: Normal strength and tone for age. Extremities: Warm and well perfused. No clubbing, cyanosis, edema.  Neuro: Alert and oriented X 3. Psych: Answers questions appropriately.   Labs: Basic Metabolic Panel: Recent Labs    05/14/24 0512 05/15/24 0421  NA 139 140  K 3.8 3.8  CL 102 104  CO2 28 28  GLUCOSE 78 118*  BUN 24* 26*  CREATININE 1.19 1.24  CALCIUM  8.4* 8.5*   Liver Function Tests: No results for input(s): AST, ALT, ALKPHOS, BILITOT, PROT, ALBUMIN in the last 72 hours. No results for input(s): LIPASE, AMYLASE in the last 72 hours. CBC: Recent Labs    05/14/24 0512 05/14/24 1326 05/15/24 0421  WBC 7.1  --  6.1  HGB 11.5*  --  11.4*  HCT 35.3*  --  33.7*  MCV 91.2  --  90.1  PLT 117* 116* 106*   Cardiac Enzymes: Recent Labs    05/13/24 1814 05/13/24 2141 05/14/24 1036  TROPONINIHS 404* 533* 395*   BNP: No results for input(s): BNP in the last 72 hours. D-Dimer: No results for input(s): DDIMER in the last 72 hours. Hemoglobin A1C: No results for input(s): HGBA1C in the last 72 hours. Fasting Lipid Panel: Recent Labs    05/14/24 0512  CHOL 106  HDL 37*  LDLCALC 55  TRIG 72  CHOLHDL 2.9   Thyroid  Function Tests: No results for input(s): TSH, T4TOTAL, T3FREE, THYROIDAB in the last 72 hours.  Invalid input(s): FREET3 Anemia Panel: No results for input(s): VITAMINB12, FOLATE, FERRITIN, TIBC, IRON, RETICCTPCT in the last 72 hours.   Radiology: CT Cervical Spine Wo Contrast Result Date: 05/13/2024 CLINICAL DATA:  Weakness, multiple falls EXAM: CT CERVICAL SPINE WITHOUT CONTRAST TECHNIQUE: Multidetector CT imaging of the cervical spine was performed without intravenous contrast. Multiplanar  CT image reconstructions were also generated. RADIATION DOSE REDUCTION: This exam was performed according to the departmental dose-optimization program which includes automated exposure control, adjustment of the mA and/or kV according to patient size and/or use of iterative reconstruction technique. COMPARISON:  None Available. FINDINGS: Alignment: Alignment is grossly anatomic. Skull base and vertebrae: No acute fracture. No primary bone lesion or focal pathologic process. Soft tissues and spinal canal: No prevertebral fluid or swelling. No visible canal hematoma. Disc levels: Multilevel spondylosis and facet hypertrophy, with disc space narrowing greatest at C3-4 and from C5-6 through C7-T1. Upper chest: Airway is patent. Mild emphysematous changes at the lung apices. Other: Reconstructed images demonstrate no additional findings. IMPRESSION: 1. No acute cervical spine fracture. 2. Multilevel cervical degenerative changes as above. Electronically Signed   By: Ozell Daring M.D.   On: 05/13/2024 19:35   CT Head Wo Contrast Result Date: 05/13/2024 CLINICAL DATA:  Weakness, recent falls EXAM: CT HEAD WITHOUT CONTRAST TECHNIQUE: Contiguous axial images were obtained from the base of the skull through the vertex without intravenous contrast. RADIATION DOSE REDUCTION: This exam was performed according to the departmental dose-optimization program which includes automated exposure control, adjustment of the mA and/or kV according to patient size and/or use of iterative reconstruction technique. COMPARISON:  09/03/2023 FINDINGS: Brain: No acute infarct or hemorrhage. Stable chronic small vessel ischemic changes within the right thalamus and periventricular white matter. Lateral ventricles and midline structures are stable. No acute extra-axial fluid collections. No mass effect. Vascular: No hyperdense vessel or unexpected calcification. Skull: Normal. Negative for fracture or focal lesion. Sinuses/Orbits: Mild soft  tissue swelling over the nasal bridge. The paranasal sinuses are clear. Other: None. IMPRESSION: 1. Soft tissue swelling over the nasal bridge. No displaced fracture. 2. No acute intracranial process. Electronically Signed   By: Ozell Daring M.D.   On: 05/13/2024 19:31   DG Knee Left Port Result Date: 05/13/2024 CLINICAL DATA:  Fall with significant bilateral knee pain. EXAM: PORTABLE LEFT KNEE - 1-2 VIEW; PORTABLE RIGHT KNEE - 1-2 VIEW COMPARISON:  None Available. FINDINGS: No evidence of fracture, dislocation, or joint effusion. No evidence of arthropathy or other focal bone abnormality. Soft tissues are unremarkable. IMPRESSION: Negative. Electronically Signed   By: Norman Gatlin M.D.   On: 05/13/2024 19:09   DG Knee Right Port Result Date: 05/13/2024 CLINICAL DATA:  Fall with significant bilateral knee pain. EXAM: PORTABLE LEFT KNEE - 1-2 VIEW; PORTABLE RIGHT KNEE - 1-2 VIEW COMPARISON:  None Available. FINDINGS: No evidence of fracture, dislocation, or joint effusion. No evidence of arthropathy or other focal bone abnormality. Soft tissues are unremarkable. IMPRESSION: Negative. Electronically Signed   By: Norman Gatlin M.D.   On: 05/13/2024 19:09   DG Chest 2 View Result Date: 05/13/2024 CLINICAL DATA:  Weakness. EXAM: CHEST - 2 VIEW COMPARISON:  Chest radiograph dated 09/03/2023. FINDINGS: There is mild chronic interstitial coarsening and bronchitic changes. No focal consolidation, pleural effusion, or pneumothorax. The cardiac silhouette is within normal limits. No acute osseous pathology. IMPRESSION: No active cardiopulmonary disease. Electronically Signed   By: Vanetta Chou M.D.   On: 05/13/2024 16:26    ECHO pending.  TELEMETRY reviewed by me 05/15/2024: Atrial fibrillation, rate 80-90s  EKG reviewed by me:  atrial fibrillation, rate 102 bpm without acute ischemic changes.   Data reviewed by me 05/15/2024: last 24h vitals  tele labs imaging I/O hospitalist progress note  Principal  Problem:   NSTEMI (non-ST elevated myocardial infarction) (HCC) Active Problems:   Chronic obstructive pulmonary disease (COPD) (HCC)   Glaucoma   Bipolar 1 disorder (HCC)   Essential hypertension   AKI (acute kidney injury) (HCC)   Paroxysmal atrial fibrillation with RVR (HCC)   GERD without esophagitis   Recurrent falls   Cognitive dysfunction   Elevated troponin    ASSESSMENT AND PLAN:  Bradley Bowers is a 78 y.o. male  with a past medical history of coronary artery disease s/p stent RCA (2017), hypertension, hyperlipidemia, paroxysmal atrial fibrillation, COPD, PAD, OSA, restless leg syndrome to who presented to the ED on 05/13/2024 for worsening generalized weakness and recurrent falls for the past 2 weeks.  Troponins found to be elevated.  Patient denies history of heart failure.  Cardiology was consulted for further evaluation.   # Recurrent Falls # Generalized Weakness -Management per primary team -Recommend PT as patient is deconditioned. -Recommend adequate hydration.  # COPD -Management per primary team.   # Coronary artery disease s/p stent to RCA (2017) # Demand ischemia # Hypertension # Hyperlipidemia Patient without chest pain. EKG with atrial fibrillation, rate 102 bpm without acute ischemic changes.  Troponins trended 160 > 400 > 530 > 390.  Lipid panel within normal limits, LDL of 55. Echo pending. -Continue aspirin  81 mg, atorvastatin  80 mg daily. -Continue metoprolol  as stated below. -Patient's BP well-controlled.  If BP becomes elevated can consider initiate losartan. -No chest pain, elevated and flat troponins is most consistent with demand/supply mismatch and not ACS. No plan for further cardiac diagnostics  # Paroxysmal atrial fibrillation EKG and telemetry with atrial fibrillation, controlled rates.  Patient states he has not been on any blood thinning medication for many years for atrial fibrillation.  Patient states he used to be on warfarin for 23  years but that was stopped by a provider when he was started on Plavix  in 2017.  Patient states he only takes aspirin  at this time. -Monitor and replenish electrolytes for a goal K >4, Mag >2  -CHA2DS2VASc score of at least 6. Recommend starting anticoagulation for stroke risk reduction. (Copay is $102.46 due to a $250.00 deductible. $47.00 once deductible is met for Eliquis and Xarelto). Patient reports worsening recurrent falls and issues bleeding. HAS-BLED score is 5. DOACs have interaction with his carbamazepine  thus not recommended by pharmacy. Patient states he will not go on Warfarin again. Patient states he understands the risk of stroke if he's not on AC.  -Continue home Cardizem  120 mg daily. -Continue metoprolol  tartrate 12.5 mg twice daily.  Ok for discharge today from a cardiac perspective pending results of echocardiogram. Will arrange for follow up in clinic with Dr. Wilburn in 1-2 weeks.    This patient's plan of care was discussed and created with Dr. Florencio and he is in agreement.  Signed: Danita Bloch, PA-C  05/15/2024, 8:40 AM [facility] Cardiology

## 2024-05-15 NOTE — Progress Notes (Signed)
 Pt is in need of an oxygen  tank to travel home with. [employer] to deliver oxygen  tank before 5 pm. Family updated on update plan of care.

## 2024-05-15 NOTE — Hospital Course (Signed)
 Bradley Bowers

## 2024-05-15 NOTE — Discharge Summary (Signed)
 Physician Discharge Summary   Patient: Bradley Bowers MRN: [id number] DOB: Mar 10, 1946  Admit date:     05/13/2024  Discharge date: 05/15/24  Discharge Physician: Leita Blanch   PCP: Cleotilde Oneil FALCON, MD   Recommendations at discharge:   follow-up [facility] cardiology in 1 to 2 weeks follow-up PCP in 1 to 2 week  Discharge Diagnoses: Principal Problem:   NSTEMI (non-ST elevated myocardial infarction) [facility]) Active Problems:   Paroxysmal atrial fibrillation with RVR (HCC)   Recurrent falls   Bipolar 1 disorder (HCC)   Essential hypertension   Chronic obstructive pulmonary disease (COPD) (HCC)   Glaucoma   AKI (acute kidney injury) (HCC)   GERD without esophagitis   Cognitive dysfunction   Elevated troponin  Bradley Bowers is a 78 y.o. Caucasian male with medical history significant for PAD, emphysema, GERD, coronary artery disease, paroxysmal atrial fibrillation, CHF, GERD, hypertension dyslipidemia, OSA and restless leg syndrome, who presented to the emergency room with acute onset of generalized weakness and falls over the last week.  He was hypoxic and was placed on 3 L of O2 via nasal cannula in triage in the ED. He has abrasions of both knees when he slipped to the floor from his chair.   Pt uses oxygen  at home chronically.  EKG showed atrial fibrillation with rapid ventricular sponsor 102 with PVCs. Imaging: 2 view chest x-ray showed no acute cardiopulmonary disease.   X-ray of both knees came back negative.   Recurrent falls due to generalized weakness Bilateral Knee abrasion -- patient is ambulatory home using walker. He has had a few mechanical falls. Came in yesterday after he could not get up after having a mechanical fall. He tells me he got tired sitting in the chair got up legs got weak and he fell down. Did not lose consciousness. -- PT OT to see patient--recommends rehab. Pt requesting to go home. Wife is agreeable. HH has been set up   Elevated troponin without chest  pain H/o CAD s/p stent to RCA 2017 -- patient was started on IV heparin  drip. Patient denies any chest pain.Heparin  d/ced -- Seen by cardiology no indication for invasive workup -- troponin trending down --cont asa, statin --pt stopped plavix  few years ago   A fib with RVR history of PAF H/o CVA x2 -- patient was on warfarin many years ago. He has history of stroke. Patient is not keen on taking anticoagulation. -- after discussing with [facility]--pt being on Carbamazepine  decreases the efficacy of DOAC. Pt does not want to do Warfarin so after d/w Cardiology PA will cont ASA for now (as before)--pt nad wife informed   Chronic COPD --stable --chronic home oxygen    Hypertension -- continue home   Bradley Bowers  --continue PPI   Bipolar 1 disorder (HCC) -  continue his Depakote  ER and Tegretol .   Overall at baseline. D/c home    Procedures: Family communication :Rock wife on the phone Consults :[facility] cardiology CODE STATUS: DNR     Cardiac diet DISCHARGE MEDICATION: Allergies as of 05/15/2024   No Known Allergies      Medication List     STOP taking these medications    clopidogrel  75 MG tablet Commonly known as: PLAVIX    polyethylene glycol 17 g packet Commonly known as: MIRALAX  / GLYCOLAX    spironolactone  25 MG tablet Commonly known as: ALDACTONE        TAKE these medications    albuterol  108 (90 Base) MCG/ACT inhaler Commonly known as: VENTOLIN  HFA Inhale  1-2 puffs into the lungs every 4 (four) hours as needed.   aspirin  EC 81 MG tablet Take 81 mg by mouth daily.   atorvastatin  80 MG tablet Commonly known as: LIPITOR  Take 80 mg by mouth daily.   benzonatate 200 MG capsule Commonly known as: TESSALON Take 200 mg by mouth 3 (three) times daily as needed for cough.   carbamazepine  200 MG tablet Commonly known as: TEGRETOL  Take 1 tablet by mouth 2 (two) times daily.   cholecalciferol  25 MCG (1000 UNIT) tablet Commonly known as: VITAMIN D3 Take 1,000 Units by  mouth daily.   cyanocobalamin  1000 MCG tablet Commonly known as: VITAMIN B12 Take 1,000 mcg by mouth daily.   diltiazem  120 MG 24 hr capsule Commonly known as: CARDIZEM  CD Take 120 mg by mouth daily.   divalproex  250 MG 24 hr tablet Commonly known as: DEPAKOTE  ER Take 500 mg by mouth daily. What changed: Another medication with the same name was removed. Continue taking this medication, and follow the directions you see here.   donepezil  10 MG tablet Commonly known as: ARICEPT  Take 1 tablet by mouth at bedtime.   feeding supplement Liqd Take 237 mLs by mouth 2 (two) times daily between meals.   fluticasone  50 MCG/ACT nasal spray Commonly known as: FLONASE  Place 1 spray into both nostrils daily as needed for allergies.   fluticasone -salmeterol 100-50 MCG/ACT Aepb Commonly known as: ADVAIR Inhale 1 puff into the lungs 2 (two) times daily.   guaiFENesin  600 MG 12 hr tablet Commonly known as: MUCINEX  Take 600 mg by mouth 2 (two) times daily as needed.   latanoprost  0.005 % ophthalmic solution Commonly known as: XALATAN  Place 1 drop into both eyes at bedtime.   mometasone -formoterol  100-5 MCG/ACT Aero Commonly known as: DULERA  Inhale 2 puffs into the lungs 2 (two) times daily.   montelukast  10 MG tablet Commonly known as: SINGULAIR  Take 10 mg by mouth at bedtime.   OLANZapine  5 MG tablet Commonly known as: ZYPREXA  Take 5 mg by mouth at bedtime.   pantoprazole  40 MG tablet Commonly known as: PROTONIX  Take 40 mg by mouth 2 (two) times daily.   timolol  0.5 % ophthalmic solution Commonly known as: TIMOPTIC  1 drop daily.   tiotropium 18 MCG inhalation capsule Commonly known as: SPIRIVA  Place 18 mcg into inhaler and inhale daily.   Travatan Z 0.004 % Soln ophthalmic solution Generic drug: Travoprost (BAK Free) Place 1 drop into both eyes at bedtime.        Follow-up Information     Alluri, Keller BROCKS, MD. Go in 1 week(s).   Specialty: Cardiology Contact  information: [address] [place] [address] [phone]         Cleotilde Oneil FALCON, MD. Schedule an appointment as soon as possible for a visit in 1 week(s).   Specialty: Internal Medicine Why: hospital f/u Contact information: [address] [facility] [place] [place] [address] [phone]                Discharge Exam: Filed Weights   05/13/24 1539  Weight: 83 kg  GENERAL:  78 y.o.-year-old patient with no acute distress.  LUNGS: Normal breath sounds bilaterally, no wheezing CARDIOVASCULAR: S1, S2 normal. No murmur  afib ABDOMEN: Soft, nontender, nondistended. Bowel sounds present.  EXTREMITIES: No  edema b/l. Bilateral knee mild abrasion   NEUROLOGIC: nonfocal  patient is alert and awake   Condition at discharge: fair  The results of significant diagnostics from this  hospitalization (including imaging, microbiology, ancillary and laboratory) are listed below for reference.   Imaging Studies: CT Cervical Spine Wo Contrast Result Date: 05/13/2024 CLINICAL DATA:  Weakness, multiple falls EXAM: CT CERVICAL SPINE WITHOUT CONTRAST TECHNIQUE: Multidetector CT imaging of the cervical spine was performed without intravenous contrast. Multiplanar CT image reconstructions were also generated. RADIATION DOSE REDUCTION: This exam was performed according to the departmental dose-optimization program which includes automated exposure control, adjustment of the mA and/or kV according to patient size and/or use of iterative reconstruction technique. COMPARISON:  None Available. FINDINGS: Alignment: Alignment is grossly anatomic. Skull base and vertebrae: No acute fracture. No primary bone lesion or focal pathologic process. Soft tissues and spinal canal: No prevertebral fluid or swelling. No visible canal hematoma. Disc levels: Multilevel spondylosis and facet hypertrophy, with disc space narrowing greatest at C3-4 and from C5-6 through C7-T1. Upper  chest: Airway is patent. Mild emphysematous changes at the lung apices. Other: Reconstructed images demonstrate no additional findings. IMPRESSION: 1. No acute cervical spine fracture. 2. Multilevel cervical degenerative changes as above. Electronically Signed   By: Ozell Daring M.D.   On: 05/13/2024 19:35   CT Head Wo Contrast Result Date: 05/13/2024 CLINICAL DATA:  Weakness, recent falls EXAM: CT HEAD WITHOUT CONTRAST TECHNIQUE: Contiguous axial images were obtained from the base of the skull through the vertex without intravenous contrast. RADIATION DOSE REDUCTION: This exam was performed according to the departmental dose-optimization program which includes automated exposure control, adjustment of the mA and/or kV according to patient size and/or use of iterative reconstruction technique. COMPARISON:  09/03/2023 FINDINGS: Brain: No acute infarct or hemorrhage. Stable chronic small vessel ischemic changes within the right thalamus and periventricular white matter. Lateral ventricles and midline structures are stable. No acute extra-axial fluid collections. No mass effect. Vascular: No hyperdense vessel or unexpected calcification. Skull: Normal. Negative for fracture or focal lesion. Sinuses/Orbits: Mild soft tissue swelling over the nasal bridge. The paranasal sinuses are clear. Other: None. IMPRESSION: 1. Soft tissue swelling over the nasal bridge. No displaced fracture. 2. No acute intracranial process. Electronically Signed   By: Ozell Daring M.D.   On: 05/13/2024 19:31   DG Knee Left Port Result Date: 05/13/2024 CLINICAL DATA:  Fall with significant bilateral knee pain. EXAM: PORTABLE LEFT KNEE - 1-2 VIEW; PORTABLE RIGHT KNEE - 1-2 VIEW COMPARISON:  None Available. FINDINGS: No evidence of fracture, dislocation, or joint effusion. No evidence of arthropathy or other focal bone abnormality. Soft tissues are unremarkable. IMPRESSION: Negative. Electronically Signed   By: Norman Gatlin M.D.   On:  05/13/2024 19:09   DG Knee Right Port Result Date: 05/13/2024 CLINICAL DATA:  Fall with significant bilateral knee pain. EXAM: PORTABLE LEFT KNEE - 1-2 VIEW; PORTABLE RIGHT KNEE - 1-2 VIEW COMPARISON:  None Available. FINDINGS: No evidence of fracture, dislocation, or joint effusion. No evidence of arthropathy or other focal bone abnormality. Soft tissues are unremarkable. IMPRESSION: Negative. Electronically Signed   By: Norman Gatlin M.D.   On: 05/13/2024 19:09   DG Chest 2 View Result Date: 05/13/2024 CLINICAL DATA:  Weakness. EXAM: CHEST - 2 VIEW COMPARISON:  Chest radiograph dated 09/03/2023. FINDINGS: There is mild chronic interstitial coarsening and bronchitic changes. No focal consolidation, pleural effusion, or pneumothorax. The cardiac silhouette is within normal limits. No acute osseous pathology. IMPRESSION: No active cardiopulmonary disease. Electronically Signed   By: Vanetta Chou M.D.   On: 05/13/2024 16:26    Microbiology: Results for orders placed or performed during the hospital  encounter of 05/13/24  MRSA Next Gen by PCR, Nasal     Status: None   Collection Time: 05/14/24  5:14 AM   Specimen: Nasal Mucosa; Nasal Swab  Result Value Ref Range Status   MRSA by PCR Next Gen NOT DETECTED NOT DETECTED Final    Comment: (NOTE) The GeneXpert MRSA Assay (FDA approved for NASAL specimens only), is one component of a comprehensive MRSA colonization surveillance program. It is not intended to diagnose MRSA infection nor to guide or monitor treatment for MRSA infections. Test performance is not FDA approved in patients less than 80 years old. Performed at [facility], [address] [place], [place] [address]     Labs: CBC: Recent Labs  Lab 05/13/24 1547 05/14/24 0512 05/14/24 1326 05/15/24 0421  WBC 14.5* 7.1  --  6.1  HGB 13.4 11.5*  --  11.4*  HCT 39.0 35.3*  --  33.7*  MCV 89.4 91.2  --  90.1  PLT 175 117* 116* 106*   Basic Metabolic Panel: Recent  Labs  Lab 05/13/24 1547 05/14/24 0512 05/15/24 0421  NA 137 139 140  K 3.6 3.8 3.8  CL 99 102 104  CO2 24 28 28   GLUCOSE 170* 78 118*  BUN 25* 24* 26*  CREATININE 1.62* 1.19 1.24  CALCIUM  9.1 8.4* 8.5*   Discharge time spent: greater than 30 minutes.  Signed: Leita Blanch, MD [facility] 05/15/2024

## 2024-05-15 NOTE — TOC Transition Note (Addendum)
 Transition of Care [facility]) - Discharge Note   Patient Details  Name: Bradley Bowers MRN: [id number] Date of Birth: 1946-01-13  [facility] CM/SW Contact:  [place] L Fradel Baldonado, LCSW Phone Number: 05/15/2024, 9:57 AM   Clinical Narrative:     CSW spoke with patient spouse. Patient wants to discharge home with [facility]. CSW and spouse discussed. Spouse chose [facility] however, CSW advised that due to the insurance plan, [facility] and [facility] are accepting patients for [facility]. Spouse chose [facility].   CSW spoke with [place] and [facility] is setup for PT/OT/RN and aide.   No further TOC needs observed. TOC signing off.   *1:47pm: Secure chat received advising that patient and spouse can not locate the oxygen  tank at their residence. CSW contacted [employer] to determine if a loaner can be provided for discharge. [facility] [employer], advised that they were able to provide patient a tank for discharge and they will pick it up.     Barriers to Discharge: Continued Medical Work up   Patient Goals and CMS Choice            Discharge Placement                       Discharge Plan and Services Additional resources added to the After Visit Summary for       Post Acute Care Choice: NA                               Social Drivers of Health (SDOH) Interventions SDOH Screenings   Food Insecurity: No Food Insecurity (05/13/2024)  Housing: Low Risk  (05/13/2024)  Transportation Needs: No Transportation Needs (05/13/2024)  Utilities: Not At Risk (05/13/2024)  Alcohol Screen: Low Risk  (08/16/2017)  Depression (PHQ2-9): Low Risk  (04/05/2021)  Financial Resource Strain: Low Risk  (04/30/2023)   Received from [facility]  Social Connections: Moderately Integrated (05/13/2024)  Tobacco Use: Medium Risk (05/13/2024)     Readmission Risk Interventions    05/14/2024   11:34 AM  Readmission Risk Prevention Plan  Transportation Screening Complete  PCP or Specialist Appt within  3-5 Days Complete  Social Work Consult for Recovery Care Planning/Counseling Complete  Palliative Care Screening Not Applicable  Medication Review [employer]) Complete

## 2024-05-15 NOTE — Plan of Care (Signed)
 Pt discharged to home with oxygen   Problem: Education: Goal: Understanding of cardiac disease, CV risk reduction, and recovery process will improve Outcome: Progressing Goal: Individualized Educational Video(s) Outcome: Progressing   Problem: Activity: Goal: Ability to tolerate increased activity will improve Outcome: Progressing   Problem: Cardiac: Goal: Ability to achieve and maintain adequate cardiovascular perfusion will improve Outcome: Progressing   Problem: Health Behavior/Discharge Planning: Goal: Ability to safely manage health-related needs after discharge will improve Outcome: Progressing   Problem: Education: Goal: Knowledge of General Education information will improve Description: Including pain rating scale, medication(s)/side effects and non-pharmacologic comfort measures Outcome: Progressing   Problem: Health Behavior/Discharge Planning: Goal: Ability to manage health-related needs will improve Outcome: Progressing   Problem: Clinical Measurements: Goal: Ability to maintain clinical measurements within normal limits will improve Outcome: Progressing Goal: Will remain free from infection Outcome: Progressing Goal: Diagnostic test results will improve Outcome: Progressing Goal: Respiratory complications will improve Outcome: Progressing Goal: Cardiovascular complication will be avoided Outcome: Progressing   Problem: Activity: Goal: Risk for activity intolerance will decrease Outcome: Progressing   Problem: Nutrition: Goal: Adequate nutrition will be maintained Outcome: Progressing   Problem: Coping: Goal: Level of anxiety will decrease Outcome: Progressing   Problem: Elimination: Goal: Will not experience complications related to bowel motility Outcome: Progressing Goal: Will not experience complications related to urinary retention Outcome: Progressing   Problem: Pain Managment: Goal: General experience of comfort will improve and/or be  controlled Outcome: Progressing   Problem: Safety: Goal: Ability to remain free from injury will improve Outcome: Progressing   Problem: Skin Integrity: Goal: Risk for impaired skin integrity will decrease Outcome: Progressing

## 2024-05-17 LAB — LIPOPROTEIN A (LPA): Lipoprotein (a): 103.5 nmol/L — ABNORMAL HIGH (ref <75.0)

## 2024-05-21 ENCOUNTER — Emergency Department

## 2024-05-21 ENCOUNTER — Other Ambulatory Visit: Payer: Self-pay

## 2024-05-21 ENCOUNTER — Observation Stay
Admission: EM | Admit: 2024-05-21 | Discharge: 2024-05-27 | Disposition: A | Discharge status: Skilled Nursing Facility | Attending: Emergency Medicine | Admitting: Emergency Medicine

## 2024-05-21 DIAGNOSIS — E669 Obesity, unspecified: Secondary | ICD-10-CM | POA: Insufficient documentation

## 2024-05-21 DIAGNOSIS — R531 Weakness: Principal | ICD-10-CM

## 2024-05-21 DIAGNOSIS — F0390 Unspecified dementia without behavioral disturbance: Secondary | ICD-10-CM | POA: Insufficient documentation

## 2024-05-21 DIAGNOSIS — W19XXXA Unspecified fall, initial encounter: Secondary | ICD-10-CM | POA: Insufficient documentation

## 2024-05-21 DIAGNOSIS — K8080 Other cholelithiasis without obstruction: Secondary | ICD-10-CM | POA: Insufficient documentation

## 2024-05-21 DIAGNOSIS — E785 Hyperlipidemia, unspecified: Secondary | ICD-10-CM | POA: Insufficient documentation

## 2024-05-21 DIAGNOSIS — J069 Acute upper respiratory infection, unspecified: Secondary | ICD-10-CM | POA: Diagnosis not present

## 2024-05-21 DIAGNOSIS — Z6828 Body mass index (BMI) 28.0-28.9, adult: Secondary | ICD-10-CM | POA: Diagnosis not present

## 2024-05-21 DIAGNOSIS — I480 Paroxysmal atrial fibrillation: Secondary | ICD-10-CM | POA: Diagnosis present

## 2024-05-21 DIAGNOSIS — A419 Sepsis, unspecified organism: Principal | ICD-10-CM | POA: Diagnosis present

## 2024-05-21 DIAGNOSIS — I4891 Unspecified atrial fibrillation: Secondary | ICD-10-CM

## 2024-05-21 DIAGNOSIS — J441 Chronic obstructive pulmonary disease with (acute) exacerbation: Secondary | ICD-10-CM | POA: Diagnosis present

## 2024-05-21 DIAGNOSIS — K219 Gastro-esophageal reflux disease without esophagitis: Secondary | ICD-10-CM | POA: Diagnosis not present

## 2024-05-21 DIAGNOSIS — R6510 Systemic inflammatory response syndrome (SIRS) of non-infectious origin without acute organ dysfunction: Secondary | ICD-10-CM | POA: Diagnosis not present

## 2024-05-21 DIAGNOSIS — F319 Bipolar disorder, unspecified: Secondary | ICD-10-CM | POA: Diagnosis not present

## 2024-05-21 DIAGNOSIS — R509 Fever, unspecified: Secondary | ICD-10-CM | POA: Diagnosis not present

## 2024-05-21 DIAGNOSIS — R296 Repeated falls: Secondary | ICD-10-CM

## 2024-05-21 DIAGNOSIS — Z1152 Encounter for screening for COVID-19: Secondary | ICD-10-CM | POA: Insufficient documentation

## 2024-05-21 DIAGNOSIS — R000 Tachycardia, unspecified: Secondary | ICD-10-CM | POA: Diagnosis not present

## 2024-05-21 DIAGNOSIS — Z7982 Long term (current) use of aspirin: Secondary | ICD-10-CM | POA: Insufficient documentation

## 2024-05-21 DIAGNOSIS — J40 Bronchitis, not specified as acute or chronic: Secondary | ICD-10-CM | POA: Diagnosis not present

## 2024-05-21 DIAGNOSIS — S022XXA Fracture of nasal bones, initial encounter for closed fracture: Secondary | ICD-10-CM | POA: Diagnosis not present

## 2024-05-21 DIAGNOSIS — I6523 Occlusion and stenosis of bilateral carotid arteries: Secondary | ICD-10-CM | POA: Diagnosis not present

## 2024-05-21 DIAGNOSIS — K8020 Calculus of gallbladder without cholecystitis without obstruction: Secondary | ICD-10-CM | POA: Diagnosis not present

## 2024-05-21 LAB — URINALYSIS, ROUTINE W REFLEX MICROSCOPIC
Bacteria, UA: NONE SEEN
Bilirubin Urine: NEGATIVE
Glucose, UA: NEGATIVE mg/dL
Hgb urine dipstick: NEGATIVE
Ketones, ur: 20 mg/dL — AB
Leukocytes,Ua: NEGATIVE
Nitrite: NEGATIVE
Protein, ur: 100 mg/dL — AB
Specific Gravity, Urine: 1.023 (ref 1.005–1.030)
pH: 5 (ref 5.0–8.0)

## 2024-05-21 LAB — COMPREHENSIVE METABOLIC PANEL WITH GFR
ALT: 56 U/L — ABNORMAL HIGH (ref 0–44)
AST: 59 U/L — ABNORMAL HIGH (ref 15–41)
Albumin: 3 g/dL — ABNORMAL LOW (ref 3.5–5.0)
Alkaline Phosphatase: 73 U/L (ref 38–126)
Anion gap: 13 (ref 5–15)
BUN: 24 mg/dL — ABNORMAL HIGH (ref 8–23)
CO2: 30 mmol/L (ref 22–32)
Calcium: 8.8 mg/dL — ABNORMAL LOW (ref 8.9–10.3)
Chloride: 93 mmol/L — ABNORMAL LOW (ref 98–111)
Creatinine, Ser: 1.06 mg/dL (ref 0.61–1.24)
GFR, Estimated: >60 mL/min (ref >60)
Glucose, Bld: 109 mg/dL — ABNORMAL HIGH (ref 70–99)
Potassium: 4.5 mmol/L (ref 3.5–5.1)
Sodium: 136 mmol/L (ref 135–145)
Total Bilirubin: 1.4 mg/dL — ABNORMAL HIGH (ref 0.0–1.2)
Total Protein: 7.1 g/dL (ref 6.5–8.1)

## 2024-05-21 LAB — CBC
HCT: 34.2 % — ABNORMAL LOW (ref 39.0–52.0)
Hemoglobin: 11.8 g/dL — ABNORMAL LOW (ref 13.0–17.0)
MCH: 30.3 pg (ref 26.0–34.0)
MCHC: 34.5 g/dL (ref 30.0–36.0)
MCV: 87.9 fL (ref 80.0–100.0)
Platelets: 182 K/uL (ref 150–400)
RBC: 3.89 MIL/uL — ABNORMAL LOW (ref 4.22–5.81)
RDW: 13.5 % (ref 11.5–15.5)
WBC: 16.4 K/uL — ABNORMAL HIGH (ref 4.0–10.5)
nRBC: 0 % (ref 0.0–0.2)

## 2024-05-21 LAB — TROPONIN I (HIGH SENSITIVITY)
Troponin I (High Sensitivity): 54 ng/L — ABNORMAL HIGH (ref <18)
Troponin I (High Sensitivity): 34 ng/L — ABNORMAL HIGH (ref <18)

## 2024-05-21 LAB — BRAIN NATRIURETIC PEPTIDE: B Natriuretic Peptide: 408.6 pg/mL — ABNORMAL HIGH (ref 0.0–100.0)

## 2024-05-21 LAB — PROCALCITONIN: Procalcitonin: 0.65 ng/mL

## 2024-05-21 LAB — MAGNESIUM: Magnesium: 1.7 mg/dL (ref 1.7–2.4)

## 2024-05-21 MED ORDER — OLANZAPINE 5 MG PO TABS
5.0000 mg | ORAL_TABLET | Freq: Every day | ORAL | Status: DC
  Administered 2024-05-21 – 2024-05-26 (×6): 5 mg via ORAL
  Filled 2024-05-21 (×7): qty 1

## 2024-05-21 MED ORDER — TIOTROPIUM BROMIDE MONOHYDRATE 18 MCG IN CAPS: 18.0000 ug | ORAL_CAPSULE | Freq: Every day | RESPIRATORY_TRACT | Status: DC

## 2024-05-21 MED ORDER — DONEPEZIL HCL 5 MG PO TABS
10.0000 mg | ORAL_TABLET | Freq: Every day | ORAL | Status: DC
  Administered 2024-05-21 – 2024-05-26 (×6): 10 mg via ORAL
  Filled 2024-05-21 (×6): qty 2

## 2024-05-21 MED ORDER — ALBUTEROL SULFATE HFA 108 (90 BASE) MCG/ACT IN AERS: 1.0000 | INHALATION_SPRAY | RESPIRATORY_TRACT | Status: DC | PRN

## 2024-05-21 MED ORDER — VITAMIN B-12 1000 MCG PO TABS
1000.0000 ug | ORAL_TABLET | Freq: Every day | ORAL | Status: DC
  Administered 2024-05-21 – 2024-05-27 (×7): 1000 ug via ORAL
  Filled 2024-05-21: qty 2
  Filled 2024-05-21: qty 1
  Filled 2024-05-21: qty 2
  Filled 2024-05-21: qty 1
  Filled 2024-05-21 (×3): qty 2

## 2024-05-21 MED ORDER — LATANOPROST 0.005 % OP SOLN
1.0000 [drp] | Freq: Every day | OPHTHALMIC | Status: DC
  Administered 2024-05-21 – 2024-05-26 (×6): 1 [drp] via OPHTHALMIC
  Filled 2024-05-21 (×2): qty 2.5

## 2024-05-21 MED ORDER — VITAMIN D 25 MCG (1000 UNIT) PO TABS
1000.0000 [IU] | ORAL_TABLET | Freq: Every day | ORAL | Status: DC
  Administered 2024-05-21 – 2024-05-27 (×7): 1000 [IU] via ORAL
  Filled 2024-05-21 (×7): qty 1

## 2024-05-21 MED ORDER — PANTOPRAZOLE SODIUM 40 MG PO TBEC
40.0000 mg | DELAYED_RELEASE_TABLET | Freq: Two times a day (BID) | ORAL | Status: DC
  Administered 2024-05-21 – 2024-05-27 (×12): 40 mg via ORAL
  Filled 2024-05-21 (×12): qty 1

## 2024-05-21 MED ORDER — MONTELUKAST SODIUM 10 MG PO TABS
10.0000 mg | ORAL_TABLET | Freq: Every day | ORAL | Status: DC
  Administered 2024-05-21 – 2024-05-26 (×6): 10 mg via ORAL
  Filled 2024-05-21 (×6): qty 1

## 2024-05-21 MED ORDER — ASPIRIN 81 MG PO TBEC
81.0000 mg | DELAYED_RELEASE_TABLET | Freq: Every day | ORAL | Status: DC
  Administered 2024-05-21 – 2024-05-27 (×7): 81 mg via ORAL
  Filled 2024-05-21 (×7): qty 1

## 2024-05-21 MED ORDER — ENSURE ENLIVE PO LIQD
237.0000 mL | Freq: Two times a day (BID) | ORAL | Status: DC
  Administered 2024-05-22 – 2024-05-27 (×9): 237 mL via ORAL
  Filled 2024-05-21: qty 237

## 2024-05-21 MED ORDER — ATORVASTATIN CALCIUM 20 MG PO TABS
80.0000 mg | ORAL_TABLET | Freq: Every day | ORAL | Status: DC
  Administered 2024-05-21 – 2024-05-27 (×7): 80 mg via ORAL
  Filled 2024-05-21 (×7): qty 4

## 2024-05-21 MED ORDER — DIVALPROEX SODIUM ER 500 MG PO TB24
500.0000 mg | ORAL_TABLET | Freq: Every day | ORAL | Status: DC
  Administered 2024-05-21 – 2024-05-27 (×7): 500 mg via ORAL
  Filled 2024-05-21 (×4): qty 2
  Filled 2024-05-21: qty 1
  Filled 2024-05-21: qty 2
  Filled 2024-05-21: qty 1

## 2024-05-21 MED ORDER — UMECLIDINIUM BROMIDE 62.5 MCG/ACT IN AEPB
1.0000 | INHALATION_SPRAY | Freq: Every day | RESPIRATORY_TRACT | Status: DC
  Administered 2024-05-22 – 2024-05-27 (×6): 1 via RESPIRATORY_TRACT
  Filled 2024-05-21 (×2): qty 7

## 2024-05-21 MED ORDER — IOHEXOL 350 MG/ML SOLN
75.0000 mL | Freq: Once | INTRAVENOUS | Status: AC | PRN
  Administered 2024-05-21: 75 mL via INTRAVENOUS

## 2024-05-21 MED ORDER — DILTIAZEM HCL ER COATED BEADS 120 MG PO CP24
120.0000 mg | ORAL_CAPSULE | Freq: Every day | ORAL | Status: DC
  Administered 2024-05-21 – 2024-05-27 (×7): 120 mg via ORAL
  Filled 2024-05-21 (×8): qty 1

## 2024-05-21 MED ORDER — TIMOLOL MALEATE 0.5 % OP SOLN
1.0000 [drp] | Freq: Every day | OPHTHALMIC | Status: DC
  Administered 2024-05-22 – 2024-05-27 (×6): 1 [drp] via OPHTHALMIC
  Filled 2024-05-21: qty 5

## 2024-05-21 NOTE — ED Provider Notes (Signed)
 [facility] Provider Note    Event Date/Time   First MD Initiated Contact with Patient 05/21/24 1534     (approximate)   History   Weakness   HPI  Bradley Bowers is a 78 y.o. male  PAD, emphysema, GERD, coronary artery disease, paroxysmal atrial fibrillation, CHF, GERD, hypertension dyslipidemia, OSA restless leg syndrome who presents to our facility for weakness, shortness of breath and frequent falls.  Patient states that he lives at home with his wife who has a broken hip.  For the past 5 days he has felt increasingly weak and short of breath despite taking his medications.  Denies any fevers or chills cough congestion or chest pain.  He reports that he had a fall from standing on Wednesday.  Patient does have a history of atrial fibrillation but he does not use any anticoagulation at baseline.  Per report he was offered skilled nursing facility during his last admission on 8/9 where he was seen for similar complaints but declined at that time.      Physical Exam   Triage Vital Signs: ED Triage Vitals  Encounter Vitals Group     BP 05/21/24 1104 105/68     Girls Systolic BP Percentile --      Girls Diastolic BP Percentile --      Boys Systolic BP Percentile --      Boys Diastolic BP Percentile --      Pulse Rate 05/21/24 1104 86     Resp 05/21/24 1104 20     Temp 05/21/24 1104 98.1 F (36.7 C)     Temp Source 05/21/24 1104 Oral     SpO2 05/21/24 1104 98 %     Weight 05/21/24 1057 183 lb (83 kg)     Height 05/21/24 1057 5' 7 (1.702 m)     Head Circumference --      Peak Flow --      Pain Score 05/21/24 1057 0     Pain Loc --      Pain Education --      Exclude from Growth Chart --     Most recent vital signs: Vitals:   05/21/24 1809 05/21/24 1830  BP: 115/78 117/75  Pulse: (!) 103 98  Resp: 16 14  Temp:    SpO2: 96% 100%    Nursing Triage Note reviewed. Vital signs reviewed and patients oxygen  saturation is normoxic on his home 3  L of O2  General: Patient is well nourished, well developed, awake and alert, resting comfortably in no acute distress Head: Normocephalic he does have forehead ecchymosis and periorbital ecchymosis Eyes: Normal inspection, extraocular muscles intact, no conjunctival pallor Ear, nose, throat: Normal external exam Neck: Normal range of motion Respiratory: Patient is in no respiratory distress, lungs mild rales Cardiovascular: Patient is  tachycardic, irregularly irregular without murmur appreciated GI: Abd SNT with no guarding or rebound  Back: Normal inspection of the back with good strength and range of motion throughout all ext Extremities: pulses intact with good cap refills, no LE pitting edema or calf tenderness Neuro: The patient is alert and oriented to person, place, and time, appropriately conversive, with 5/5 bilat UE/LE strength, no gross motor or sensory defects noted. Coordination appears to be adequate. Skin: Abrasions to bilateral knees and bilateral arms that do not appear infected, he is covered in ecchymosis Psych: normal mood and affect, no SI or HI  ED Results / Procedures / Treatments   Labs (all labs  ordered are listed, but only abnormal results are displayed) Labs Reviewed  COMPREHENSIVE METABOLIC PANEL WITH GFR - Abnormal; Notable for the following components:      Result Value   Chloride 93 (*)    Glucose, Bld 109 (*)    BUN 24 (*)    Calcium  8.8 (*)    Albumin 3.0 (*)    AST 59 (*)    ALT 56 (*)    Total Bilirubin 1.4 (*)    All other components within normal limits  CBC - Abnormal; Notable for the following components:   WBC 16.4 (*)    RBC 3.89 (*)    Hemoglobin 11.8 (*)    HCT 34.2 (*)    All other components within normal limits  URINALYSIS, ROUTINE W REFLEX MICROSCOPIC - Abnormal; Notable for the following components:   Color, Urine AMBER (*)    APPearance CLEAR (*)    Ketones, ur 20 (*)    Protein, ur 100 (*)    All other components within  normal limits  BRAIN NATRIURETIC PEPTIDE - Abnormal; Notable for the following components:   B Natriuretic Peptide 408.6 (*)    All other components within normal limits  TROPONIN I (HIGH SENSITIVITY) - Abnormal; Notable for the following components:   Troponin I (High Sensitivity) 54 (*)    All other components within normal limits  MAGNESIUM   PROCALCITONIN  CBG MONITORING, ED  TROPONIN I (HIGH SENSITIVITY)     EKG EKG and rhythm strip are interpreted by myself:   EKG: afib rhythm] at heart rate of 127, normal QRS duration, QTc 488 nonspecific ST segments and T waves no ectopy EKG not consistent with Acute STEMI Rhythm strip: afib in lead II   RADIOLOGY CT head: No acute abnormality on my independent review and interpretation and radiologist agrees CT face: Bilateral nasal bone fractures that are nondisplaced CT PE Chest: No acute abnormality    PROCEDURES:  Critical Care performed: No  Procedures   MEDICATIONS ORDERED IN ED: Medications  albuterol  (VENTOLIN  HFA) 108 (90 Base) MCG/ACT inhaler 1-2 puff (has no administration in time range)  aspirin  EC tablet 81 mg (has no administration in time range)  atorvastatin  (LIPITOR ) tablet 80 mg (has no administration in time range)  cholecalciferol  (VITAMIN D3) 25 MCG (1000 UNIT) tablet 1,000 Units (has no administration in time range)  donepezil  (ARICEPT ) tablet 10 mg (has no administration in time range)  divalproex  (DEPAKOTE  ER) 24 hr tablet 500 mg (has no administration in time range)  diltiazem  (CARDIZEM  CD) 24 hr capsule 120 mg (has no administration in time range)  cyanocobalamin  (VITAMIN B12) tablet 1,000 mcg (has no administration in time range)  feeding supplement (ENSURE ENLIVE / ENSURE PLUS) liquid 237 mL (has no administration in time range)  latanoprost  (XALATAN ) 0.005 % ophthalmic solution 1 drop (has no administration in time range)  OLANZapine  (ZYPREXA ) tablet 5 mg (has no administration in time range)   montelukast  (SINGULAIR ) tablet 10 mg (has no administration in time range)  pantoprazole  (PROTONIX ) EC tablet 40 mg (has no administration in time range)  timolol  (TIMOPTIC ) 0.5 % ophthalmic solution 1 drop (has no administration in time range)  tiotropium (SPIRIVA ) inhalation capsule ([facility] use ONLY) 18 mcg (has no administration in time range)  iohexol  (OMNIPAQUE ) 350 MG/ML injection 75 mL (75 mLs Intravenous Contrast Given 05/21/24 1730)     IMPRESSION / MDM / ASSESSMENT AND PLAN / ED COURSE  Differential diagnosis includes, but is not limited to intracranial hemorrhage, facial fracture, pulmonary embolism, UTI, electrolyte derangement anemia  ED course: I reviewed the discharge summary from last hospitalization and patient has no focal neurological deficits on my exam.  He is covered in ecchymosis and given his recent unwitnessed fall decision to obtain imaging of the patient's head and face.  Patient is in atrial fibrillation, which you always is.  He was initially tachycardic but now his heart rate is in the 90s in the room.  Troponins are improved from last hospitalization.  His BNP is improved from last hospitalization.  Disposition is pending diagnostics and ability to ambulate   Clinical Course as of 05/21/24 1904  Fri May 21, 2024  1542 WBC(!): 16.4 Slight elevation [HD]  1708 Troponin I (High Sensitivity)(!): 54 Elevated but better than it has been in the past [HD]  1740 Urinalysis, Routine w reflex microscopic -Urine, Random(!) Not consistent with urinalysis [HD]  1740 Brain natriuretic peptide(!) Not as elevated as previous [HD]  1849 CT Head Wo Contrast Unremarkble [HD]  1849 CT Angio Chest PE W and/or Wo Contrast No acute abnormality [HD]  1850 CT Maxillofacial W Contrast Na [HD]  1856 Besides for mild nasal bone fractures, patient today does not have an admittable diagnosis.  I have reviewed his workup with his wife on the phone and the  patient's presence, and unlike last hospitalization orders are agreeable to nursing home/rehab.  Will make the patient a transition of care,, order the patient's home medications and have him work with PT OT in the morning [HD]    Clinical Course User Index [HD] Nicholaus Rolland BRAVO, MD   Risk: 5 This patient has a high risk of morbidity due to further diagnostic testing or treatment. Rationale: This patient's evaluation and management involve a high risk of morbidity due to the potential severity of presenting symptoms, need for diagnostic testing, and/or initiation of treatment that may require close monitoring. The differential includes conditions with potential for significant deterioration or requiring escalation of care. Treatment decisions in the ED, including medication administration, procedural interventions, or disposition planning, reflect this level of risk. Additional Support: -- Drug therapy requiring intensive monitoring for toxicity [ ]  -- Decision regarding elective major surgery with idenitified patient or procedure risk factors [ ]  -- Decision regarding hospitalization or escalation of hospital-level care [ ]  -- Decision not to resuscitate or to de-escalate care because of poor prognosis [ ]  -- Parental controlled substances [ ]   COPA: 5 The patient has a severe exacerbation, progression, or side effect of treatment of the following illness/illnesses: []  OR  The patient has the following acute or chronic illness/injury that poses a possible threat to life or bodily function: [X] : The patient has a potentially serious acute condition or an acute exacerbation of a chronic illness requiring urgent evaluation and management in the Emergency Department. The clinical presentation necessitates immediate consideration of life-threatening or function-threatening diagnoses, even if they are ultimately ruled out.  Data(2/3 categories following were performed): 5 I reviewed or ordered at  least three unique tests, external notes, and/or the history required an independent historian as one of the three requirements as following: wife, cbc, bmp, troponin AND  I independently interpreted the following test: CT head OR  I discussed the management of the patient with the following external physician or qualified healthcare provider: []     Suggested E/M Coding Level: 5, 99285, This has been selected based on the 2023 CPT guidelines for  E/M codes in the Emergency Department based on 2/3 of the CoPA, Data, and Risk.    FINAL CLINICAL IMPRESSION(S) / ED DIAGNOSES   Final diagnoses:  General weakness  Closed fracture of nasal bone, initial encounter  Atrial fibrillation, unspecified type (HCC)  Falls frequently     Rx / DC Orders   ED Discharge Orders     None        Note:  This document was prepared using Dragon voice recognition software and may include unintentional dictation errors.   Nicholaus Rolland BRAVO, MD 05/21/24 JUDITHANN

## 2024-05-21 NOTE — ED Notes (Signed)
 Oxygen  tank replaced

## 2024-05-21 NOTE — ED Notes (Signed)
Patient on the phone with wife.  

## 2024-05-21 NOTE — ED Notes (Signed)
 Patient cleaned from soaked diaper. Clean brief placed and chucks. Specimen obtained. CB in reach. Monitor applied.

## 2024-05-21 NOTE — ED Notes (Signed)
 Lab called to come and get blood

## 2024-05-21 NOTE — ED Triage Notes (Signed)
 Pt comes via EMs from home with c/o weakness. Pt states he was seen [facility]re last week for same and falls. Pt was admitted and dc last Saturday. Pt has been getting more week since at home. Pt had another fall on Wed. Pt denies any injuries or pain anywhere.   Pt has offered rehab and refused per EMs reports. Pt has dec intake

## 2024-05-21 NOTE — ED Notes (Signed)
 Patient up in bed feeding self

## 2024-05-21 NOTE — ED Notes (Signed)
 Patient assisted to the toilet in the room for BM and back to bed without incident. Brief saturated with urine and streak stool. Clean brief placed, chucks placed, and patient cleaned after cleaning self after BM. Blanket and pillow provided. CB in reach. No needs expressed. Will continue to monitor.

## 2024-05-22 NOTE — ED Notes (Signed)
Patient used bedpan.

## 2024-05-22 NOTE — Evaluation (Signed)
 Physical Therapy Evaluation Patient Details Name: Bradley Bowers MRN: [id number] DOB: [birth year] Today's Date: 05/22/2024  History of Present Illness  comes via EMs from home with c/o weakness. Pt states he was seen ehre last week for same and falls. Pt was admitted and dc last Saturday. Pt has been getting more week since at home. Pt had another fall on Wed. Pt denies any injuries or pain anywhere.   Clinical Impression  Patient received standing at edge of bed with OT present in room. He is agreeable to PT joining session. Patient reports weakness and multiple falls. He is able to side step and ambulate forward and backward a couple of steps with cga. Patient distance limited by short O2 line in ED. He will continue to benefit from skilled PT to improve strength and safety with mobility.         If plan is discharge home, recommend the following: A little help with walking and/or transfers;A little help with bathing/dressing/bathroom;Assistance with cooking/housework;Assist for transportation;Help with stairs or ramp for entrance   Can travel by private vehicle   Yes    Equipment Recommendations None recommended by PT  Recommendations for Other Services       Functional Status Assessment Patient has had a recent decline in their functional status and demonstrates the ability to make significant improvements in function in a reasonable and predictable amount of time.     Precautions / Restrictions Precautions Precautions: Fall Recall of Precautions/Restrictions: Intact Restrictions Weight Bearing Restrictions Per Provider Order: No      Mobility  Bed Mobility Overal bed mobility: Needs Assistance Bed Mobility: Supine to Sit, Sit to Supine     Supine to sit: Min assist Sit to supine: Min assist   General bed mobility comments: Pt is slow to move and used bed rails to help him sit at EOB.    Transfers Overall transfer level: Needs assistance Equipment used: Rolling  walker (2 wheels) Transfers: Sit to/from Stand Sit to Stand: Contact guard assist   Step pivot transfers: Contact guard assist            Ambulation/Gait Ambulation/Gait assistance: Contact guard assist Gait Distance (Feet): 4 Feet Assistive device: Rolling walker (2 wheels) Gait Pattern/deviations: Step-to pattern Gait velocity: decr     General Gait Details: patient able to side step at edge of stretcher and take a couple of steps forward and backward with cga. Limited by O2 line at this time.  Stairs            Wheelchair Mobility     Tilt Bed    Modified Rankin (Stroke Patients Only)       Balance Overall balance assessment: Needs assistance, History of Falls Sitting-balance support: Feet supported Sitting balance-[name]: Good     Standing balance support: Bilateral upper extremity supported, During functional activity, Reliant on assistive device for balance Standing balance-[name]: Poor Standing balance comment: Pt requires RW for balance, history of falls                             Pertinent Vitals/Pain Pain Assessment Pain Assessment: Faces Faces Pain Scale: Hurts a little bit Pain Location: generalized from falling. Bruises on body Pain Descriptors / Indicators: Discomfort, Sore Pain Intervention(s): Monitored during session, Repositioned    Home Living Family/patient expects to be discharged to:: Skilled nursing facility Living Arrangements: Spouse/significant other Available Help at Discharge: Family Type of Home: House Home Access:  Stairs to enter Entrance Stairs-Rails: Right;Left;Can reach both Entrance Stairs-Number of Steps: 1   Home Layout: One level Home Equipment: Rollator (4 wheels);Tub bench;Cane - quad Additional Comments: Pt uses 3L of O2 at home    Prior Function Prior Level of Function : Independent/Modified Independent;History of Falls (last six months)             Mobility Comments: Pt is  household ambulator at baseline. Uses rollator in the home and [facility] in the community       Extremity/Trunk Assessment   Upper Extremity Assessment Upper Extremity Assessment: Defer to OT evaluation    Lower Extremity Assessment Lower Extremity Assessment: Generalized weakness    Cervical / Trunk Assessment Cervical / Trunk Assessment: Normal  Communication   Communication Communication: No apparent difficulties    Cognition Arousal: Alert Behavior During Therapy: WFL for tasks assessed/performed   PT - Cognitive impairments: No apparent impairments                       PT - Cognition Comments: Pt is pleasant and agreeable to therapy. Following commands: Intact       Cueing Cueing Techniques: Verbal cues     General Comments      Exercises     Assessment/Plan    PT Assessment Patient needs continued PT services  PT Problem List Decreased strength;Decreased activity tolerance;Decreased balance;Decreased mobility;Decreased safety awareness;Pain;Cardiopulmonary status limiting activity       PT Treatment Interventions DME instruction;Gait training;Functional mobility training;Stair training;Therapeutic activities;Therapeutic exercise;Patient/family education;Neuromuscular re-education;Balance training    PT Goals (Current goals can be found in the Care Plan section)  Acute Rehab PT Goals Patient Stated Goal: get stronger and go back home PT Goal Formulation: With patient Time For Goal Achievement: 06/05/24 Potential to Achieve Goals: Good    Frequency Min 2X/week     Co-evaluation               AM-PAC PT 6 Clicks Mobility  Outcome Measure Help needed turning from your back to your side while in a flat bed without using bedrails?: A Little Help needed moving from lying on your back to sitting on the side of a flat bed without using bedrails?: A Little Help needed moving to and from a bed to a chair (including a wheelchair)?: A Little Help  needed standing up from a chair using your arms (e.g., wheelchair or bedside chair)?: A Little Help needed to walk in hospital room?: A Lot Help needed climbing 3-5 steps with a railing? : A Lot 6 Click Score: 16    End of Session Equipment Utilized During Treatment: Oxygen  Activity Tolerance: Patient tolerated treatment well Patient left: in bed;with call bell/phone within reach Nurse Communication: Mobility status PT Visit Diagnosis: Muscle weakness (generalized) (M62.81);Repeated falls (R29.6);Unsteadiness on feet (R26.81);Difficulty in walking, not elsewhere classified (R26.2)    Time: 9169-9157 PT Time Calculation (min) (ACUTE ONLY): 12 min   Charges:   PT Evaluation $PT Eval Low Complexity: 1 Low   PT General Charges $$ ACUTE PT VISIT: 1 Visit         Kerstyn Coryell, PT, GCS 05/22/24,9:38 AM

## 2024-05-22 NOTE — ED Provider Notes (Signed)
 Emergency Medicine Observation Re-evaluation Note  Bradley Bowers is a 78 y.o. male, seen on rounds today.  Pt initially presented to the ED for complaints of Weakness Currently, the patient is is a border pending skilled nursing facility placement.  He is on 3 L of O2 at baseline and has A-fib  Physical Exam  BP 125/70   Pulse (!) 104   Temp 98.1 F (36.7 C) (Oral)   Resp (!) 21   Ht 5' 7 (1.702 m)   Wt 83 kg   SpO2 98%   BMI 28.66 kg/m  Physical Exam General: No acute distress Cardiac: Well-perfused Lungs: Clear to auscultation bilaterally Psych: No issues  ED Course / MDM  EKG:   I have reviewed the labs performed to date as well as medications administered while in observation.  Recent changes in the last 24 hours include patient met with PT OT and social work.  Plan for skilled nursing facility  Plan  Current plan is for [facility]    Nicholaus Rolland BRAVO, MD 05/22/24 1859

## 2024-05-22 NOTE — Progress Notes (Signed)
 CSW spoke with patient and his wife who would like [facility] for placement. CSW spoke with Tammy in admissions who can accept patient for SNF placement. Patient's PASRR is expired and a new one was requested and still pending. Patient can admit once PASRR is completed.    Patients insurance authorization was approved. Auth ID # [id number], next review date 05/25/24

## 2024-05-22 NOTE — Progress Notes (Addendum)
 RE: Bradley Bowers  Date of Birth: 1946-06-07  Date: 05/22/2024    To Whom It May Concern:   Please be advised that the above-named patient will require a short-term nursing home stay - anticipated 30 days or less for rehabilitation and strengthening. The plan is for return home.

## 2024-05-22 NOTE — ED Notes (Signed)
 Cleaned up of urinary incontinence. Brief and chux changed. Tolerated well. Denies further needs at this time.

## 2024-05-22 NOTE — Evaluation (Signed)
 Occupational Therapy Evaluation Patient Details Name: Bradley Bowers MRN: [id number] DOB: Mar 11, 1946 Today's Date: 05/22/2024   History of Present Illness   comes via EMs from home with c/o weakness. Pt states he was seen ehre last week for same and falls. Pt was admitted and dc last Saturday. Pt has been getting more week since at home. Pt had another fall on Wed. Pt denies any injuries or pain anywhere.     Clinical Impressions Mr. Cordner was seen for OT/PT co-evaluation this date. Prior to initial hospital admission 1 week prior, pt was generally independent with all ADL/IADL management, however his spouse would assist him on occasion 2/2 baseline weakness from prior stroke. Pt does not drive. Pt lives with his spouse in a 1 level home with 1 STE. Pt presents with deficits in strength, balance, safety awareness, and activity tolerance, affecting safe and optimal ADL completion. Pt currently requires MIN A for bed mobility and MAX A for LB ADL management including to doff/don a brief.  Pt would benefit from skilled OT services to address noted impairments and functional limitations (see below for any additional details) in order to maximize safety and independence while minimizing falls risk and re-admission. Anticipate the need for follow up OT services upon acute hospital DC (< 3 hours/day).      If plan is discharge home, recommend the following:   A lot of help with walking and/or transfers;A lot of help with bathing/dressing/bathroom;Assistance with cooking/housework;Help with stairs or ramp for entrance;Assist for transportation     Functional Status Assessment   Patient has had a recent decline in their functional status and demonstrates the ability to make significant improvements in function in a reasonable and predictable amount of time.     Equipment Recommendations   None recommended by OT     Recommendations for Other Services          Precautions/Restrictions   Precautions Precautions: Fall Recall of Precautions/Restrictions: Intact Restrictions Weight Bearing Restrictions Per Provider Order: No     Mobility Bed Mobility Overal bed mobility: Needs Assistance Bed Mobility: Supine to Sit, Sit to Supine     Supine to sit: Min assist Sit to supine: Min assist   General bed mobility comments: Pt is slow to move and used bed rails to help him sit at EOB.    Transfers Overall transfer level: Needs assistance Equipment used: Rolling walker (2 wheels) Transfers: Sit to/from Stand Sit to Stand: Contact guard assist, Min assist     Step pivot transfers: Contact guard assist            Balance Overall balance assessment: Needs assistance, History of Falls Sitting-balance support: Feet supported Sitting balance-[name]: Good     Standing balance support: Bilateral upper extremity supported, During functional activity, Reliant on assistive device for balance Standing balance-[name]: Poor Standing balance comment: Pt requires RW for balance, history of falls                           ADL either performed or assessed with clinical judgement   ADL Overall ADL's : Needs assistance/impaired                                       General ADL Comments: MAX A to doff/don brief after found to be soiled with urine. MIN A for bed mobility and  STS t/f from gurney bed. Pt is able to stand and take side steps toward EOB with close CGA. Anticipate at increased assist required for further functional mobility/transfers and LB ADL management from STS.     Vision Patient Visual Report: No change from baseline       Perception         Praxis         Pertinent Vitals/Pain Pain Assessment Pain Assessment: Faces Faces Pain Scale: Hurts a little bit Pain Location: generalized from falling. Bruises on body Pain Descriptors / Indicators: Discomfort, Sore Pain Intervention(s):  Repositioned, Monitored during session     Extremity/Trunk Assessment Upper Extremity Assessment Upper Extremity Assessment: Generalized weakness   Lower Extremity Assessment Lower Extremity Assessment: Generalized weakness   Cervical / Trunk Assessment Cervical / Trunk Assessment: Normal   Communication Communication Communication: No apparent difficulties   Cognition Arousal: Alert Behavior During Therapy: WFL for tasks assessed/performed Cognition: No apparent impairments                               Following commands: Intact       Cueing  General Comments   Cueing Techniques: Verbal cues      Exercises Other Exercises Other Exercises: Pt educated about role of OT in acute setting, safety, falls prevention strateagies, and DC recs.   Shoulder Instructions      Home Living Family/patient expects to be discharged to:: Skilled nursing facility Living Arrangements: Spouse/significant other Available Help at Discharge: Family Type of Home: House Home Access: Stairs to enter [employer] of Steps: 1 Entrance Stairs-Rails: Right;Left;Can reach both Home Layout: One level     Bathroom Shower/Tub: Tub only   Firefighter: Standard Bathroom Accessibility: Yes   Home Equipment: Rollator (4 wheels);Tub bench;Cane - quad   Additional Comments: Pt uses 3L of O2 at home      Prior Functioning/Environment Prior Level of Function : Independent/Modified Independent;History of Falls (last six months)             Mobility Comments: Pt is household ambulator at baseline. Uses rollator in the home and [facility] in the community ADLs Comments: Wife typically assists, however she is currently recovering from a knee injury.    OT Problem List: Decreased strength;Decreased activity tolerance;Decreased knowledge of use of DME or AE;Impaired balance (sitting and/or standing);Decreased safety awareness   OT Treatment/Interventions: Self-care/ADL  training;Therapeutic exercise;Therapeutic activities;DME and/or AE instruction;Patient/family education;Balance training;Energy conservation      OT Goals(Current goals can be found in the care plan section)   Acute Rehab OT Goals Patient Stated Goal: To go home OT Goal Formulation: With patient Time For Goal Achievement: 06/05/24 Potential to Achieve Goals: Good ADL Goals Pt Will Perform Grooming: sitting;with modified independence Pt Will Perform Lower Body Dressing: with set-up;with supervision;sit to/from stand Pt Will Transfer to Toilet: bedside commode;with set-up;with supervision;ambulating Pt Will Perform Toileting - Clothing Manipulation and hygiene: sit to/from stand;with modified independence;with adaptive equipment   OT Frequency:  Min 2X/week    Co-evaluation PT/OT/SLP Co-Evaluation/Treatment: Yes   PT goals addressed during session: Mobility/safety with mobility;Balance;Proper use of DME OT goals addressed during session: ADL's and self-care;Proper use of Adaptive equipment and DME      AM-PAC OT 6 Clicks Daily Activity     Outcome Measure Help from another person eating meals?: None Help from another person taking care of personal grooming?: A Little Help from another person toileting, which includes  using toliet, bedpan, or urinal?: A Lot Help from another person bathing (including washing, rinsing, drying)?: A Lot Help from another person to put on and taking off regular upper body clothing?: A Little Help from another person to put on and taking off regular lower body clothing?: A Lot 6 Click Score: 16   End of Session Equipment Utilized During Treatment: Gait belt;Rolling walker (2 wheels)  Activity Tolerance: Patient tolerated treatment well Patient left: in bed;with call bell/phone within reach (In ED gurney bed, both bed rails up as recieved.)  OT Visit Diagnosis: Other abnormalities of gait and mobility (R26.89);Muscle weakness (generalized) (M62.81)                 Time: 9172-9157 OT Time Calculation (min): 15 min Charges:  OT General Charges $OT Visit: 1 Visit OT Evaluation $OT Eval Moderate Complexity: 1 Mod  Camille Thau Shasta, M.S., OTR/L 05/22/24, 11:06 AM

## 2024-05-22 NOTE — NC FL2 (Signed)
 [place]  MEDICAID FL2 LEVEL OF CARE FORM     IDENTIFICATION  Patient Name: Bradley Bowers Birthdate: [birth year] Sex: male Admission Date (Current Location): 05/21/2024  [facility] and [place] Number:  Chiropodist and Address:  [facility], [address] [place], [place] [address]      Provider Number: [id number]  Attending Physician Name and Address:  Dicky Anes, MD [facility], [address], [place], [place] [address] Relative Name and Phone Number:  HEYWOOD, TOKUNAGA (Spouse)  [phone]    Current Level of Care: Hospital Recommended Level of Care: Skilled Nursing Facility Prior Approval Number:    Date Approved/Denied:   PASRR Number: Pending  Discharge Plan: SNF    Current Diagnoses: Patient Active Problem List   Diagnosis Date Noted   Fall at home 05/15/2024   Abrasion of knee 05/15/2024   Elevated troponin 05/14/2024   NSTEMI (non-ST elevated myocardial infarction) (HCC) 05/13/2024   Paroxysmal atrial fibrillation with RVR (HCC) 05/13/2024   GERD without esophagitis 05/13/2024   Recurrent falls 05/13/2024   Cognitive dysfunction 05/13/2024   Severe sepsis (HCC) 09/04/2023   Severe sepsis with acute organ dysfunction (HCC) 09/03/2023   Chronic respiratory failure with hypoxia (HCC) 09/03/2023   Expressive aphasia 09/03/2023   COVID-19 virus infection 09/24/2022   History of TIA (transient ischemic attack) 09/24/2022   Generalized weakness 09/23/2022   Acute metabolic encephalopathy 09/04/2022   Urinary tract infection in male 08/25/2022   AKI (acute kidney injury) (HCC) 08/25/2022   Parkinsonian features    CVA (cerebral vascular accident) (HCC) 01/31/2022   Essential hypertension 01/30/2022   Peripheral neuropathy 01/30/2022   TIA (transient ischemic attack) 01/29/2022   Acute respiratory failure with hypoxia (HCC) 05/22/2021   COPD with acute exacerbation (HCC) 05/22/2021   Hyperlipemia  05/22/2021   Bipolar 1 disorder (HCC) 05/22/2021   Renal insufficiency 05/22/2021   Leg edema 05/22/2021   Coronary artery disease 09/24/2016   Glaucoma 09/24/2016   Bipolar affective disorder, current episode manic with psychotic symptoms (HCC) 09/23/2016   Chronic obstructive pulmonary disease (COPD) (HCC) 11/06/2015   Gastro-esophageal reflux disease without esophagitis 10/24/2014   Obstructive apnea 10/24/2014   Restless leg 10/24/2014    Orientation RESPIRATION BLADDER Height & Weight     Self, Time, Situation, Place  O2 (3 Liters) Continent Weight: 183 lb (83 kg) Height:  5' 7 (170.2 cm)  BEHAVIORAL SYMPTOMS/MOOD NEUROLOGICAL BOWEL NUTRITION STATUS      Continent Diet (Regular)  AMBULATORY STATUS COMMUNICATION OF NEEDS Skin   Limited Assist Verbally Normal                       Personal Care Assistance Level of Assistance  Bathing, Feeding, Dressing Bathing Assistance: Limited assistance Feeding assistance: Independent Dressing Assistance: Limited assistance     Functional Limitations Info  Sight, Hearing, Speech Sight Info: Adequate Hearing Info: Adequate Speech Info: Adequate    SPECIAL CARE FACTORS FREQUENCY                       Contractures Contractures Info: Not present    Additional Factors Info  Code Status, Allergies Code Status Info: Full Allergies Info: No known allergies           Current Medications (05/22/2024):  This is the current hospital active medication list Current Facility-Administered Medications  Medication Dose Route Frequency Provider Last Rate Last Admin   albuterol  (VENTOLIN  HFA) 108 (90 Base)  MCG/ACT inhaler 1-2 puff  1-2 puff Inhalation Q4H PRN Davis, Hillary E, MD       aspirin  EC tablet 81 mg  81 mg Oral Daily Nicholaus Maize E, MD   81 mg at 05/22/24 0915   atorvastatin  (LIPITOR ) tablet 80 mg  80 mg Oral Daily Nicholaus Maize BRAVO, MD   80 mg at 05/21/24 2111   cholecalciferol  (VITAMIN D3) 25 MCG (1000 UNIT)  tablet 1,000 Units  1,000 Units Oral Daily Davis, Hillary E, MD   1,000 Units at 05/22/24 0915   cyanocobalamin  (VITAMIN B12) tablet 1,000 mcg  1,000 mcg Oral Daily Davis, Hillary E, MD   1,000 mcg at 05/22/24 0915   diltiazem  (CARDIZEM  CD) 24 hr capsule 120 mg  120 mg Oral Daily Nicholaus Maize E, MD   120 mg at 05/22/24 0915   divalproex  (DEPAKOTE  ER) 24 hr tablet 500 mg  500 mg Oral Daily Nicholaus Maize E, MD   500 mg at 05/22/24 9085   donepezil  (ARICEPT ) tablet 10 mg  10 mg Oral QHS Nicholaus Maize E, MD   10 mg at 05/21/24 2115   feeding supplement (ENSURE ENLIVE / ENSURE PLUS) liquid 237 mL  237 mL Oral BID BM Nicholaus Maize E, MD   237 mL at 05/22/24 0916   latanoprost  (XALATAN ) 0.005 % ophthalmic solution 1 drop  1 drop Both Eyes QHS Nicholaus Maize E, MD   1 drop at 05/21/24 2148   montelukast  (SINGULAIR ) tablet 10 mg  10 mg Oral QHS Nicholaus Maize BRAVO, MD   10 mg at 05/21/24 2113   OLANZapine  (ZYPREXA ) tablet 5 mg  5 mg Oral QHS Nicholaus Maize E, MD   5 mg at 05/21/24 2114   pantoprazole  (PROTONIX ) EC tablet 40 mg  40 mg Oral BID Nicholaus Maize BRAVO, MD   40 mg at 05/21/24 2115   timolol  (TIMOPTIC ) 0.5 % ophthalmic solution 1 drop  1 drop Both Eyes Daily Nicholaus Maize BRAVO, MD       umeclidinium bromide  (INCRUSE ELLIPTA ) 62.5 MCG/ACT 1 puff  1 puff Inhalation Daily Nicholaus Maize BRAVO, MD       Current Outpatient Medications  Medication Sig Dispense Refill   albuterol  (VENTOLIN  HFA) 108 (90 Base) MCG/ACT inhaler Inhale 1-2 puffs into the lungs every 4 (four) hours as needed.     aspirin  81 MG EC tablet Take 81 mg by mouth daily.  11   atorvastatin  (LIPITOR ) 80 MG tablet Take 80 mg by mouth daily.     benzonatate (TESSALON) 200 MG capsule Take 200 mg by mouth 3 (three) times daily as needed for cough.     carbamazepine  (TEGRETOL ) 200 MG tablet Take 1 tablet by mouth 2 (two) times daily.     cholecalciferol  (VITAMIN D3) 25 MCG (1000 UNIT) tablet Take 1,000 Units by mouth daily.     cyanocobalamin   (VITAMIN B12) 1000 MCG tablet Take 1,000 mcg by mouth daily.     diltiazem  (CARDIZEM  CD) 120 MG 24 hr capsule Take 120 mg by mouth daily.     divalproex  (DEPAKOTE  ER) 250 MG 24 hr tablet Take 500 mg by mouth daily.     donepezil  (ARICEPT ) 10 MG tablet Take 1 tablet by mouth at bedtime.     fluticasone  (FLONASE ) 50 MCG/ACT nasal spray Place 1 spray into both nostrils daily as needed for allergies.     fluticasone -salmeterol (ADVAIR) 100-50 MCG/ACT AEPB Inhale 1 puff into the lungs 2 (two) times daily.     guaiFENesin  (MUCINEX )  600 MG 12 hr tablet Take 600 mg by mouth 2 (two) times daily as needed.     latanoprost  (XALATAN ) 0.005 % ophthalmic solution Place 1 drop into both eyes at bedtime.     mometasone -formoterol  (DULERA ) 100-5 MCG/ACT AERO Inhale 2 puffs into the lungs 2 (two) times daily. 1 each    montelukast  (SINGULAIR ) 10 MG tablet Take 10 mg by mouth at bedtime.     OLANZapine  (ZYPREXA ) 5 MG tablet Take 5 mg by mouth at bedtime.     pantoprazole  (PROTONIX ) 40 MG tablet Take 40 mg by mouth 2 (two) times daily.     timolol  (TIMOPTIC ) 0.5 % ophthalmic solution 1 drop daily.     tiotropium (SPIRIVA ) 18 MCG inhalation capsule Place 18 mcg into inhaler and inhale daily.     TRAVATAN Z 0.004 % SOLN ophthalmic solution Place 1 drop into both eyes at bedtime.     feeding supplement (ENSURE ENLIVE / ENSURE PLUS) LIQD Take 237 mLs by mouth 2 (two) times daily between meals. 237 mL 12     Discharge Medications: Please see discharge summary for a list of discharge medications.  Relevant Imaging Results:  Relevant Lab Results:   Additional Information SSN: [id number]  Clotilda Brenner, LCSWA

## 2024-05-22 NOTE — Progress Notes (Signed)
 PT consult pending at this time.

## 2024-05-22 NOTE — ED Notes (Signed)
 Dinner tray provided to patient, assisted patient in getting tray set up and everything open. Pt able to feed himself. Denies further needs at this time.

## 2024-05-22 NOTE — ED Notes (Signed)
 Pt cleaned up of urinary incontinence. Tolerated well. Clean brief placed on patient.

## 2024-05-23 MED ORDER — CARBAMAZEPINE 200 MG PO TABS
200.0000 mg | ORAL_TABLET | Freq: Two times a day (BID) | ORAL | Status: DC
  Administered 2024-05-23 – 2024-05-27 (×9): 200 mg via ORAL
  Filled 2024-05-23 (×9): qty 1

## 2024-05-23 NOTE — ED Notes (Signed)
 Pt given breakfast meal, sleeping at this time. He advised he would eat when he woke up.

## 2024-05-23 NOTE — ED Notes (Signed)
 Pt called out stating that he needs to be change, he has soiled his brief. This tech provided pt with new wet wipes and changed pt's brief. Pt has no other needs.

## 2024-05-23 NOTE — ED Notes (Signed)
 Pt given dinner meal and eating same

## 2024-05-23 NOTE — ED Notes (Signed)
 Pt brief changed and pt cleaned.

## 2024-05-23 NOTE — ED Notes (Signed)
 Pt given lunch tray and eating same

## 2024-05-23 NOTE — ED Provider Notes (Signed)
 Emergency Medicine Observation Re-evaluation Note  Bradley Bowers is a 78 y.o. male, seen on rounds today.  Pt initially presented to the ED for complaints of Weakness  Currently, the patient is resting comfortably.  Physical Exam  BP 92/63 (BP Location: Right Arm)   Pulse 90   Temp 98.4 F (36.9 C) (Oral)   Resp 15   Ht 5' 7 (1.702 m)   Wt 83 kg   SpO2 100%   BMI 28.66 kg/m  General: No acute distress Cardiac: Well-perfused extremities Lungs: No respiratory distress Psych: Appropriate mood and affect  ED Course / MDM  EKG:   I have reviewed the labs performed to date as well as medications administered while in observation.  Recent changes in the last 24 hours include none.  Plan  Current plan is for placement.   Shamecca Whitebread K, MD 05/23/24 [phone]

## 2024-05-23 NOTE — ED Notes (Addendum)
 Pt brief changed and pt wiped clean. Pt attempted to use urinal, however due to pt anatomy, unable to do same without soiling bed.

## 2024-05-24 ENCOUNTER — Emergency Department

## 2024-05-24 DIAGNOSIS — J40 Bronchitis, not specified as acute or chronic: Secondary | ICD-10-CM | POA: Diagnosis not present

## 2024-05-24 DIAGNOSIS — R531 Weakness: Secondary | ICD-10-CM | POA: Diagnosis not present

## 2024-05-24 DIAGNOSIS — R509 Fever, unspecified: Secondary | ICD-10-CM | POA: Diagnosis not present

## 2024-05-24 MED ORDER — VANCOMYCIN HCL 2000 MG/400ML IV SOLN
2000.0000 mg | Freq: Once | INTRAVENOUS | Status: AC
  Administered 2024-05-25: 2000 mg via INTRAVENOUS
  Filled 2024-05-24: qty 400

## 2024-05-24 MED ORDER — VANCOMYCIN HCL IN DEXTROSE 1-5 GM/200ML-% IV SOLN: 1000.0000 mg | Freq: Once | INTRAVENOUS | Status: DC

## 2024-05-24 MED ORDER — SODIUM CHLORIDE 0.9 % IV SOLN
2.0000 g | Freq: Once | INTRAVENOUS | Status: AC
  Administered 2024-05-25: 2 g via INTRAVENOUS
  Filled 2024-05-24: qty 12.5

## 2024-05-24 MED ORDER — LACTATED RINGERS IV BOLUS (SEPSIS)
1000.0000 mL | Freq: Once | INTRAVENOUS | Status: AC
  Administered 2024-05-25: 1000 mL via INTRAVENOUS

## 2024-05-24 MED ORDER — METRONIDAZOLE 500 MG/100ML IV SOLN
500.0000 mg | Freq: Once | INTRAVENOUS | Status: AC
  Administered 2024-05-25: 500 mg via INTRAVENOUS
  Filled 2024-05-24: qty 100

## 2024-05-24 NOTE — ED Provider Notes (Signed)
 11:10 PM  Assumed care of patient at shift change.  Patient with low grade temp of 99.6, tachycardia, tachypnea.  Recent labs showed WBC 16k.  Will repeats labs, urine, CXR, RVP, cultures.    1:20 AM  Pt's white blood cell count is 12,000.  Rectal temperature 100.7.  Chronically wears oxygen  at 3 L.  Has been coughing up yellow sputum but chest x-ray is negative.  RVP also negative.  Getting broad-spectrum antibiotics.  Given he meets sepsis criteria, will discuss with hospitalist for admission.  2:23 AM  Consulted and discussed patient's case with hospitalist, Dr. Lawence.  I have recommended admission and consulting physician agrees and will place admission orders.  Patient (and family if present) agree with this plan.   I reviewed all nursing notes, vitals, pertinent previous records.  All labs, EKGs, imaging ordered have been independently reviewed and interpreted by myself.   CRITICAL CARE Performed by: Josette Sink   Total critical care time: 30 minutes  Critical care time was exclusive of separately billable procedures and treating other patients.  Critical care was necessary to treat or prevent imminent or life-threatening deterioration.  Critical care was time spent personally by me on the following activities: development of treatment plan with patient and/or surrogate as well as nursing, discussions with consultants, evaluation of patient's response to treatment, examination of patient, obtaining history from patient or surrogate, ordering and performing treatments and interventions, ordering and review of laboratory studies, ordering and review of radiographic studies, pulse oximetry and re-evaluation of patient's condition.    Trinika Cortese, Josette SAILOR, DO 05/25/24 [phone]

## 2024-05-24 NOTE — ED Notes (Signed)
Dinner tray was provided to pt.

## 2024-05-24 NOTE — ED Notes (Signed)
 Pt was provided with a lunch tray

## 2024-05-24 NOTE — ED Notes (Signed)
 This tech changed pt brief and all of pt sheets. Warm Blankets applied to pt

## 2024-05-24 NOTE — ED Notes (Signed)
 Pt requested a brief change and one was provided. Pt was repositioned in the bed and denies any needs at this time. Pt's bed is in the lowest, locked position.

## 2024-05-24 NOTE — ED Notes (Signed)
 Pt was provided with a breakfast tray and vitals completed.

## 2024-05-25 DIAGNOSIS — J441 Chronic obstructive pulmonary disease with (acute) exacerbation: Secondary | ICD-10-CM | POA: Diagnosis not present

## 2024-05-25 DIAGNOSIS — F319 Bipolar disorder, unspecified: Secondary | ICD-10-CM

## 2024-05-25 DIAGNOSIS — I480 Paroxysmal atrial fibrillation: Secondary | ICD-10-CM | POA: Diagnosis not present

## 2024-05-25 DIAGNOSIS — A419 Sepsis, unspecified organism: Secondary | ICD-10-CM | POA: Diagnosis not present

## 2024-05-25 DIAGNOSIS — E785 Hyperlipidemia, unspecified: Secondary | ICD-10-CM | POA: Insufficient documentation

## 2024-05-25 DIAGNOSIS — F0390 Unspecified dementia without behavioral disturbance: Secondary | ICD-10-CM | POA: Insufficient documentation

## 2024-05-25 LAB — CBC WITH DIFFERENTIAL/PLATELET
Abs Immature Granulocytes: 0.07 K/uL (ref 0.00–0.07)
Basophils Absolute: 0 K/uL (ref 0.0–0.1)
Basophils Relative: 0 %
Eosinophils Absolute: 0 K/uL (ref 0.0–0.5)
Eosinophils Relative: 0 %
HCT: 31.1 % — ABNORMAL LOW (ref 39.0–52.0)
Hemoglobin: 10.5 g/dL — ABNORMAL LOW (ref 13.0–17.0)
Immature Granulocytes: 1 %
Lymphocytes Relative: 7 %
Lymphs Abs: 0.9 K/uL (ref 0.7–4.0)
MCH: 30.1 pg (ref 26.0–34.0)
MCHC: 33.8 g/dL (ref 30.0–36.0)
MCV: 89.1 fL (ref 80.0–100.0)
Monocytes Absolute: 0.7 K/uL (ref 0.1–1.0)
Monocytes Relative: 5 %
Neutro Abs: 10.9 K/uL — ABNORMAL HIGH (ref 1.7–7.7)
Neutrophils Relative %: 87 %
Platelets: 183 K/uL (ref 150–400)
RBC: 3.49 MIL/uL — ABNORMAL LOW (ref 4.22–5.81)
RDW: 13.7 % (ref 11.5–15.5)
WBC: 12.7 K/uL — ABNORMAL HIGH (ref 4.0–10.5)
nRBC: 0 % (ref 0.0–0.2)

## 2024-05-25 LAB — CBC
HCT: 27.3 % — ABNORMAL LOW (ref 39.0–52.0)
Hemoglobin: 9.2 g/dL — ABNORMAL LOW (ref 13.0–17.0)
MCH: 30.5 pg (ref 26.0–34.0)
MCHC: 33.7 g/dL (ref 30.0–36.0)
MCV: 90.4 fL (ref 80.0–100.0)
Platelets: 146 K/uL — ABNORMAL LOW (ref 150–400)
RBC: 3.02 MIL/uL — ABNORMAL LOW (ref 4.22–5.81)
RDW: 13.8 % (ref 11.5–15.5)
WBC: 9.3 K/uL (ref 4.0–10.5)
nRBC: 0 % (ref 0.0–0.2)

## 2024-05-25 LAB — COMPREHENSIVE METABOLIC PANEL WITH GFR
ALT: 31 U/L (ref 0–44)
AST: 37 U/L (ref 15–41)
Albumin: 2.7 g/dL — ABNORMAL LOW (ref 3.5–5.0)
Alkaline Phosphatase: 77 U/L (ref 38–126)
Anion gap: 9 (ref 5–15)
BUN: 25 mg/dL — ABNORMAL HIGH (ref 8–23)
CO2: 30 mmol/L (ref 22–32)
Calcium: 8.3 mg/dL — ABNORMAL LOW (ref 8.9–10.3)
Chloride: 93 mmol/L — ABNORMAL LOW (ref 98–111)
Creatinine, Ser: 1.07 mg/dL (ref 0.61–1.24)
GFR, Estimated: >60 mL/min (ref >60)
Glucose, Bld: 126 mg/dL — ABNORMAL HIGH (ref 70–99)
Potassium: 3.7 mmol/L (ref 3.5–5.1)
Sodium: 132 mmol/L — ABNORMAL LOW (ref 135–145)
Total Bilirubin: 1 mg/dL (ref 0.0–1.2)
Total Protein: 6.8 g/dL (ref 6.5–8.1)

## 2024-05-25 LAB — BASIC METABOLIC PANEL WITH GFR
Anion gap: 8 (ref 5–15)
BUN: 24 mg/dL — ABNORMAL HIGH (ref 8–23)
CO2: 31 mmol/L (ref 22–32)
Calcium: 8 mg/dL — ABNORMAL LOW (ref 8.9–10.3)
Chloride: 97 mmol/L — ABNORMAL LOW (ref 98–111)
Creatinine, Ser: 0.99 mg/dL (ref 0.61–1.24)
GFR, Estimated: >60 mL/min (ref >60)
Glucose, Bld: 97 mg/dL (ref 70–99)
Potassium: 3.6 mmol/L (ref 3.5–5.1)
Sodium: 136 mmol/L (ref 135–145)

## 2024-05-25 LAB — PROTIME-INR
INR: 1.1 (ref 0.8–1.2)
Prothrombin Time: 14.4 s (ref 11.4–15.2)

## 2024-05-25 LAB — URINALYSIS, W/ REFLEX TO CULTURE (INFECTION SUSPECTED)
Bilirubin Urine: NEGATIVE
Glucose, UA: NEGATIVE mg/dL
Ketones, ur: NEGATIVE mg/dL
Leukocytes,Ua: NEGATIVE
Nitrite: NEGATIVE
Protein, ur: 100 mg/dL — AB
Specific Gravity, Urine: 1.023 (ref 1.005–1.030)
pH: 6 (ref 5.0–8.0)

## 2024-05-25 LAB — LACTIC ACID, PLASMA: Lactic Acid, Venous: 1.5 mmol/L (ref 0.5–1.9)

## 2024-05-25 LAB — RESP PANEL BY RT-PCR (RSV, FLU A&B, COVID)  RVPGX2
Influenza A by PCR: NEGATIVE
Influenza B by PCR: NEGATIVE
Resp Syncytial Virus by PCR: NEGATIVE
SARS Coronavirus 2 by RT PCR: NEGATIVE

## 2024-05-25 LAB — CBG MONITORING, ED: Glucose-Capillary: 145 mg/dL — ABNORMAL HIGH (ref 70–99)

## 2024-05-25 LAB — CORTISOL-AM, BLOOD: Cortisol - AM: 6.4 ug/dL — ABNORMAL LOW (ref 6.7–22.6)

## 2024-05-25 LAB — PROCALCITONIN: Procalcitonin: 0.2 ng/mL

## 2024-05-25 MED ORDER — METHYLPREDNISOLONE SODIUM SUCC 125 MG IJ SOLR
80.0000 mg | Freq: Every day | INTRAMUSCULAR | Status: DC
  Administered 2024-05-25: 80 mg via INTRAVENOUS
  Filled 2024-05-25: qty 2

## 2024-05-25 MED ORDER — ACETAMINOPHEN 500 MG PO TABS
1000.0000 mg | ORAL_TABLET | Freq: Once | ORAL | Status: AC
  Administered 2024-05-25: 1000 mg via ORAL
  Filled 2024-05-25: qty 2

## 2024-05-25 MED ORDER — LACTATED RINGERS IV SOLN
150.0000 mL/h | INTRAVENOUS | Status: DC
  Administered 2024-05-25 (×2): 150 mL/h via INTRAVENOUS

## 2024-05-25 MED ORDER — MAGNESIUM HYDROXIDE 400 MG/5ML PO SUSP: 30.0000 mL | Freq: Every day | ORAL | Status: DC | PRN

## 2024-05-25 MED ORDER — ENOXAPARIN SODIUM 40 MG/0.4ML IJ SOSY
40.0000 mg | PREFILLED_SYRINGE | INTRAMUSCULAR | Status: DC
  Administered 2024-05-25 – 2024-05-27 (×3): 40 mg via SUBCUTANEOUS
  Filled 2024-05-25 (×3): qty 0.4

## 2024-05-25 MED ORDER — IPRATROPIUM-ALBUTEROL 0.5-2.5 (3) MG/3ML IN SOLN
3.0000 mL | Freq: Four times a day (QID) | RESPIRATORY_TRACT | Status: DC
  Administered 2024-05-25 (×2): 3 mL via RESPIRATORY_TRACT
  Filled 2024-05-25 (×2): qty 3

## 2024-05-25 MED ORDER — GUAIFENESIN ER 600 MG PO TB12
600.0000 mg | ORAL_TABLET | Freq: Two times a day (BID) | ORAL | Status: DC
  Administered 2024-05-25 – 2024-05-27 (×5): 600 mg via ORAL
  Filled 2024-05-25 (×5): qty 1

## 2024-05-25 MED ORDER — TRAZODONE HCL 50 MG PO TABS: 25.0000 mg | ORAL_TABLET | Freq: Every evening | ORAL | Status: DC | PRN

## 2024-05-25 MED ORDER — SODIUM CHLORIDE 0.9 % IV SOLN
2.0000 g | INTRAVENOUS | Status: DC
  Administered 2024-05-25 – 2024-05-26 (×2): 2 g via INTRAVENOUS
  Filled 2024-05-25 (×2): qty 20

## 2024-05-25 MED ORDER — HYDROCOD POLI-CHLORPHE POLI ER 10-8 MG/5ML PO SUER: 5.0000 mL | Freq: Two times a day (BID) | ORAL | Status: DC | PRN

## 2024-05-25 MED ORDER — ACETAMINOPHEN 650 MG RE SUPP: 650.0000 mg | Freq: Four times a day (QID) | RECTAL | Status: DC | PRN

## 2024-05-25 MED ORDER — ACETAMINOPHEN 325 MG PO TABS
650.0000 mg | ORAL_TABLET | Freq: Four times a day (QID) | ORAL | Status: DC | PRN
  Filled 2024-05-25: qty 2

## 2024-05-25 MED ORDER — ONDANSETRON HCL 4 MG/2ML IJ SOLN: 4.0000 mg | Freq: Four times a day (QID) | INTRAMUSCULAR | Status: DC | PRN

## 2024-05-25 MED ORDER — SODIUM CHLORIDE 0.9 % IV SOLN: 2.0000 g | INTRAVENOUS | Status: DC

## 2024-05-25 MED ORDER — SODIUM CHLORIDE 0.9 % IV SOLN
500.0000 mg | INTRAVENOUS | Status: DC
  Administered 2024-05-25 – 2024-05-27 (×3): 500 mg via INTRAVENOUS
  Filled 2024-05-25 (×3): qty 5

## 2024-05-25 MED ORDER — METHYLPREDNISOLONE SODIUM SUCC 40 MG IJ SOLR: 40.0000 mg | Freq: Two times a day (BID) | INTRAMUSCULAR | Status: DC

## 2024-05-25 MED ORDER — LATANOPROST 0.005 % OP SOLN: 1.0000 [drp] | Freq: Every day | OPHTHALMIC | Status: DC

## 2024-05-25 MED ORDER — ONDANSETRON HCL 4 MG PO TABS
4.0000 mg | ORAL_TABLET | Freq: Four times a day (QID) | ORAL | Status: DC | PRN
Start: 2024-05-25 — End: 2024-05-27

## 2024-05-25 NOTE — Assessment & Plan Note (Signed)
 Continue statin therapy

## 2024-05-25 NOTE — ED Notes (Signed)
 Pt c/o wet brief,pt changed,clean dry brief and pad placed at this time.

## 2024-05-25 NOTE — Sepsis Progress Note (Signed)
 Elink monitoring for the code sepsis protocol.

## 2024-05-25 NOTE — Plan of Care (Signed)
  Problem: Fluid Volume: Goal: Hemodynamic stability will improve Outcome: Progressing   Problem: Clinical Measurements: Goal: Signs and symptoms of infection will decrease Outcome: Progressing   Problem: Respiratory: Goal: Ability to maintain adequate ventilation will improve Outcome: Progressing   Problem: Education: Goal: Knowledge of General Education information will improve Description: Including pain rating scale, medication(s)/side effects and non-pharmacologic comfort measures Outcome: Progressing   Problem: Health Behavior/Discharge Planning: Goal: Ability to manage health-related needs will improve Outcome: Progressing   Problem: Clinical Measurements: Goal: Ability to maintain clinical measurements within normal limits will improve Outcome: Progressing Goal: Will remain free from infection Outcome: Progressing Goal: Diagnostic test results will improve Outcome: Progressing Goal: Respiratory complications will improve Outcome: Progressing Goal: Cardiovascular complication will be avoided Outcome: Progressing   Problem: Activity: Goal: Risk for activity intolerance will decrease Outcome: Progressing   Problem: Nutrition: Goal: Adequate nutrition will be maintained Outcome: Progressing   Problem: Coping: Goal: Level of anxiety will decrease Outcome: Progressing   Problem: Elimination: Goal: Will not experience complications related to bowel motility Outcome: Progressing Goal: Will not experience complications related to urinary retention Outcome: Progressing   Problem: Pain Managment: Goal: General experience of comfort will improve and/or be controlled Outcome: Progressing   Problem: Safety: Goal: Ability to remain free from injury will improve Outcome: Progressing   Problem: Skin Integrity: Goal: Risk for impaired skin integrity will decrease Outcome: Progressing

## 2024-05-25 NOTE — Assessment & Plan Note (Signed)
-   Will continue Depakote , Depakote  and Zyprexa .

## 2024-05-25 NOTE — Assessment & Plan Note (Deleted)
-   The patient is currently better. - Will continue Cardizem  CD. - The patient is not on anticoagulation due to fall risk.

## 2024-05-25 NOTE — [place] FL2 (Signed)
 [place]  MEDICAID FL2 LEVEL OF CARE FORM     IDENTIFICATION  Patient Name: Bradley Bowers Birthdate: 07-May-1946 Sex: male Admission Date (Current Location): 05/21/2024  [facility] and [place] Number:  Chiropodist and Address:  [facility], [address], [place], [place] [address]      Provider Number: [id number]  Attending Physician Name and Address:  Dorinda Drue DASEN, MD  Relative Name and Phone Number:  BURDELL, PEED (Spouse)  [phone] (Mobile)    Current Level of Care: Hospital Recommended Level of Care: Skilled Nursing Facility Prior Approval Number:    Date Approved/Denied:   PASRR Number: [id number]  Discharge Plan: SNF    Current Diagnoses: Patient Active Problem List   Diagnosis Date Noted   Sepsis due to undetermined organism (HCC) 05/25/2024   Dyslipidemia 05/25/2024   Dementia (HCC) 05/25/2024   Fall at home 05/15/2024   Abrasion of knee 05/15/2024   Elevated troponin 05/14/2024   NSTEMI (non-ST elevated myocardial infarction) (HCC) 05/13/2024   Paroxysmal atrial fibrillation with RVR (HCC) 05/13/2024   GERD without esophagitis 05/13/2024   Recurrent falls 05/13/2024   Cognitive dysfunction 05/13/2024   Severe sepsis (HCC) 09/04/2023   Severe sepsis with acute organ dysfunction (HCC) 09/03/2023   Chronic respiratory failure with hypoxia (HCC) 09/03/2023   Expressive aphasia 09/03/2023   COVID-19 virus infection 09/24/2022   History of TIA (transient ischemic attack) 09/24/2022   Generalized weakness 09/23/2022   Acute metabolic encephalopathy 09/04/2022   Urinary tract infection in male 08/25/2022   AKI (acute kidney injury) (HCC) 08/25/2022   Parkinsonian features    CVA (cerebral vascular accident) (HCC) 01/31/2022   Essential hypertension 01/30/2022   Peripheral neuropathy 01/30/2022   TIA (transient ischemic attack) 01/29/2022   Acute respiratory failure with hypoxia (HCC) 05/22/2021   COPD with acute  exacerbation (HCC) 05/22/2021   Hyperlipemia 05/22/2021   Bipolar 1 disorder (HCC) 05/22/2021   Renal insufficiency 05/22/2021   Leg edema 05/22/2021   Coronary artery disease 09/24/2016   Glaucoma 09/24/2016   Bipolar affective disorder, current episode manic with psychotic symptoms (HCC) 09/23/2016   Chronic obstructive pulmonary disease (COPD) (HCC) 11/06/2015   Gastro-esophageal reflux disease without esophagitis 10/24/2014   Obstructive apnea 10/24/2014   Restless leg 10/24/2014    Orientation RESPIRATION BLADDER Height & Weight     Self, Time, Situation, Place  O2 (3 Liters) Continent Weight: 183 lb (83 kg) Height:  5' 7 (170.2 cm)  BEHAVIORAL SYMPTOMS/MOOD NEUROLOGICAL BOWEL NUTRITION STATUS      Continent Diet (Regular)  AMBULATORY STATUS COMMUNICATION OF NEEDS Skin   Limited Assist Verbally Normal                       Personal Care Assistance Level of Assistance  Bathing, Feeding, Dressing Bathing Assistance: Limited assistance Feeding assistance: Independent Dressing Assistance: Limited assistance     Functional Limitations Info  Sight, Hearing, Speech Sight Info: Adequate Hearing Info: Adequate Speech Info: Adequate    SPECIAL CARE FACTORS FREQUENCY  PT (By licensed PT), OT (By licensed OT)     PT Frequency: 5x week OT Frequency: 3x week            Contractures Contractures Info: Not present    Additional Factors Info  Code Status, Allergies Code Status Info: Full Allergies Info: No known allergies           Current Medications (05/25/2024):  This is the current hospital active medication list Current  Facility-Administered Medications  Medication Dose Route Frequency Provider Last Rate Last Admin   acetaminophen  (TYLENOL ) tablet 650 mg  650 mg Oral Q6H PRN Mansy, Jan A, MD       Or   acetaminophen  (TYLENOL ) suppository 650 mg  650 mg Rectal Q6H PRN Mansy, Jan A, MD       albuterol  (VENTOLIN  HFA) 108 (90 Base) MCG/ACT inhaler 1-2 puff   1-2 puff Inhalation Q4H PRN Davis, Hillary E, MD       aspirin  EC tablet 81 mg  81 mg Oral Daily Nicholaus Maize E, MD   81 mg at 05/25/24 0940   atorvastatin  (LIPITOR ) tablet 80 mg  80 mg Oral Daily Nicholaus Maize E, MD   80 mg at 05/25/24 9060   azithromycin  (ZITHROMAX ) 500 mg in sodium chloride  0.9 % 250 mL IVPB  500 mg Intravenous Q24H Mansy, Jan A, MD 250 mL/hr at 05/25/24 0939 500 mg at 05/25/24 9060   carbamazepine  (TEGRETOL ) tablet 200 mg  200 mg Oral BID Jessup, Charles, MD   200 mg at 05/25/24 0940   cefTRIAXone  (ROCEPHIN ) 2 g in sodium chloride  0.9 % 100 mL IVPB  2 g Intravenous Q24H Belue, Nathan S, RPH 200 mL/hr at 05/25/24 0934 2 g at 05/25/24 0934   chlorpheniramine-HYDROcodone  (TUSSIONEX) 10-8 MG/5ML suspension 5 mL  5 mL Oral Q12H PRN Mansy, Jan A, MD       cholecalciferol  (VITAMIN D3) 25 MCG (1000 UNIT) tablet 1,000 Units  1,000 Units Oral Daily Nicholaus Maize BRAVO, MD   1,000 Units at 05/25/24 0940   cyanocobalamin  (VITAMIN B12) tablet 1,000 mcg  1,000 mcg Oral Daily Nicholaus Maize E, MD   1,000 mcg at 05/25/24 0940   diltiazem  (CARDIZEM  CD) 24 hr capsule 120 mg  120 mg Oral Daily Nicholaus Maize E, MD   120 mg at 05/25/24 0940   divalproex  (DEPAKOTE  ER) 24 hr tablet 500 mg  500 mg Oral Daily Nicholaus Maize E, MD   500 mg at 05/25/24 0940   donepezil  (ARICEPT ) tablet 10 mg  10 mg Oral QHS Nicholaus Maize E, MD   10 mg at 05/24/24 2134   enoxaparin  (LOVENOX ) injection 40 mg  40 mg Subcutaneous Q24H Mansy, Jan A, MD   40 mg at 05/25/24 0939   feeding supplement (ENSURE ENLIVE / ENSURE PLUS) liquid 237 mL  237 mL Oral BID BM Nicholaus Maize E, MD   237 mL at 05/25/24 0941   guaiFENesin  (MUCINEX ) 12 hr tablet 600 mg  600 mg Oral BID Mansy, Jan A, MD   600 mg at 05/25/24 0940   ipratropium-albuterol  (DUONEB) 0.5-2.5 (3) MG/3ML nebulizer solution 3 mL  3 mL Nebulization QID Mansy, Jan A, MD   3 mL at 05/25/24 0939   lactated ringers  infusion  150 mL/hr Intravenous Continuous Mansy, Jan A, MD  150 mL/hr at 05/25/24 0532 150 mL/hr at 05/25/24 0532   latanoprost  (XALATAN ) 0.005 % ophthalmic solution 1 drop  1 drop Both Eyes QHS Nicholaus Maize E, MD   1 drop at 05/24/24 2137   magnesium  hydroxide (MILK OF MAGNESIA) suspension 30 mL  30 mL Oral Daily PRN Mansy, Jan A, MD       methylPREDNISolone  sodium succinate (SOLU-MEDROL ) 125 mg/2 mL injection 80 mg  80 mg Intravenous Daily Mansy, Jan A, MD   80 mg at 05/25/24 9060   montelukast  (SINGULAIR ) tablet 10 mg  10 mg Oral QHS Nicholaus Maize BRAVO, MD   10 mg at 05/24/24 2134  OLANZapine  (ZYPREXA ) tablet 5 mg  5 mg Oral QHS Nicholaus Maize E, MD   5 mg at 05/24/24 2134   ondansetron  (ZOFRAN ) tablet 4 mg  4 mg Oral Q6H PRN Mansy, Jan A, MD       Or   ondansetron  (ZOFRAN ) injection 4 mg  4 mg Intravenous Q6H PRN Mansy, Jan A, MD       pantoprazole  (PROTONIX ) EC tablet 40 mg  40 mg Oral BID Nicholaus Maize E, MD   40 mg at 05/25/24 0940   timolol  (TIMOPTIC ) 0.5 % ophthalmic solution 1 drop  1 drop Both Eyes Daily Nicholaus Maize BRAVO, MD   1 drop at 05/25/24 9056   traZODone  (DESYREL ) tablet 25 mg  25 mg Oral QHS PRN Mansy, Jan A, MD       umeclidinium bromide  (INCRUSE ELLIPTA ) 62.5 MCG/ACT 1 puff  1 puff Inhalation Daily Nicholaus Maize BRAVO, MD   1 puff at 05/25/24 0940   Current Outpatient Medications  Medication Sig Dispense Refill   albuterol  (VENTOLIN  HFA) 108 (90 Base) MCG/ACT inhaler Inhale 1-2 puffs into the lungs every 4 (four) hours as needed.     aspirin  81 MG EC tablet Take 81 mg by mouth daily.  11   atorvastatin  (LIPITOR ) 80 MG tablet Take 80 mg by mouth daily.     benzonatate (TESSALON) 200 MG capsule Take 200 mg by mouth 3 (three) times daily as needed for cough.     carbamazepine  (TEGRETOL ) 200 MG tablet Take 1 tablet by mouth 2 (two) times daily.     cholecalciferol  (VITAMIN D3) 25 MCG (1000 UNIT) tablet Take 1,000 Units by mouth daily.     cyanocobalamin  (VITAMIN B12) 1000 MCG tablet Take 1,000 mcg by mouth daily.     diltiazem   (CARDIZEM  CD) 120 MG 24 hr capsule Take 120 mg by mouth daily.     divalproex  (DEPAKOTE  ER) 250 MG 24 hr tablet Take 500 mg by mouth daily.     donepezil  (ARICEPT ) 10 MG tablet Take 1 tablet by mouth at bedtime.     fluticasone  (FLONASE ) 50 MCG/ACT nasal spray Place 1 spray into both nostrils daily as needed for allergies.     fluticasone -salmeterol (ADVAIR) 100-50 MCG/ACT AEPB Inhale 1 puff into the lungs 2 (two) times daily.     guaiFENesin  (MUCINEX ) 600 MG 12 hr tablet Take 600 mg by mouth 2 (two) times daily as needed.     latanoprost  (XALATAN ) 0.005 % ophthalmic solution Place 1 drop into both eyes at bedtime.     mometasone -formoterol  (DULERA ) 100-5 MCG/ACT AERO Inhale 2 puffs into the lungs 2 (two) times daily. 1 each    montelukast  (SINGULAIR ) 10 MG tablet Take 10 mg by mouth at bedtime.     OLANZapine  (ZYPREXA ) 5 MG tablet Take 5 mg by mouth at bedtime.     pantoprazole  (PROTONIX ) 40 MG tablet Take 40 mg by mouth 2 (two) times daily.     timolol  (TIMOPTIC ) 0.5 % ophthalmic solution 1 drop daily.     tiotropium (SPIRIVA ) 18 MCG inhalation capsule Place 18 mcg into inhaler and inhale daily.     TRAVATAN Z 0.004 % SOLN ophthalmic solution Place 1 drop into both eyes at bedtime.     feeding supplement (ENSURE ENLIVE / ENSURE PLUS) LIQD Take 237 mLs by mouth 2 (two) times daily between meals. 237 mL 12     Discharge Medications: Please see discharge summary for a list of discharge medications.  Relevant Imaging Results:  Relevant Lab Results:   Additional Information SSN: [id number]  Edsel DELENA Fischer, LCSW

## 2024-05-25 NOTE — Assessment & Plan Note (Signed)
-   This is secondary to #1. - The patient will be placed on bronchodilator therapy as well as steroid therapy and mucolytics.

## 2024-05-25 NOTE — ED Notes (Signed)
 Pt noted to be in soiled brief and have chux pad soiled underneath him. This RN assisted pt into gown. This RN changed linens underneath pt, placed pt on new chux pad and new brief. Pt very appreciative.

## 2024-05-25 NOTE — TOC CM/SW Note (Addendum)
..  Transition of Care [facility]) - Inpatient Brief Assessment   Patient Details  Name: Bradley Bowers MRN: [id number] Date of Birth: 1945/12/07  Transition of Care [facility]) CM/SW Contact:    Edsel DELENA Fischer, LCSW Phone Number: 05/25/2024, 10:37 AM   Clinical Narrative:  SW updated PASRR number to [facility].  Messaged MD to review and sign.  FL2 has been signed by MD-Dr. Dorinda.  SW contacted [facility] for placement.  [facility] expressed that they are still able to take pt.  SW messaged MD regarding placement.  Reponse Once he is medically ready we will let you know. At this point he is still having temp spike, soft BP, appearing septic and that's why TRH was made to admit this dawn so we have not cleared him yet.    Transition of Care Asessment:

## 2024-05-25 NOTE — Assessment & Plan Note (Signed)
-   The patient is currently better. - Will continue Cardizem  CD. - The patient is not on anticoagulation due to fall risk.

## 2024-05-25 NOTE — Progress Notes (Signed)
 Progress Note   Patient: Bradley Bowers FMW:[id number] DOB: Aug 10, 1946 DOA: 05/21/2024     0 DOS: the patient was seen and examined on 05/25/2024   Brief hospital course: From HPI GILL DELROSSI is a 78 y.o. Caucasian male with medical history significant for hypertension, dyslipidemia, OSA, RLS, asthma, atrial fibrillation COPD, and PAD, who presented to the emergency room with acute onset of generalized weakness as well as dyspnea with associated cough now productive of yellow sputum.  He initially did not have any fever and Mirabello temperature of 100.7 with tachycardia and tachypnea.    ED Course: Vital signs revealed a temperature of 100.7/3.2, BP 103/58, resp.  Respiratory rate  20 and heart rate 98 with pulse currently of 100% on 3 L O2 via nasal cannula.    Assessment and Plan:  Sepsis due to undetermined organism [facility]) - The patient will be admitted to a medical telemetry bed. Continue empiric antibiotics therapy with IV Rocephin  and Zithromax  for likely lower respiratory infection and possibly early pneumonia. Continue mucolytic therapy Urinalysis not suggestive of UTI - Sepsis is manifested by fever, tachycardia and tachypnea, and leukocytosis. Follow-up on blood cultures     Paroxysmal atrial fibrillation with RVR (HCC) Rate improved - Will continue Cardizem  CD. - The patient is not on anticoagulation due to fall risk.   COPD with acute exacerbation (HCC) Continue nebulization Will hold off steroid therapy in the setting of sepsis   Bipolar 1 disorder (HCC) Continue Depakote , Depakote  and Zyprexa .   Dementia (HCC) - Will continue Aricept .   Dyslipidemia - Continue statin therapy.   GERD without esophagitis - Will continue PPI therapy.     DVT prophylaxis: Lovenox .   Advanced Care Planning:    Code Status: The patient is DNR and DNI   Disposition Plan: PT recommends skilled nursing facility   Subjective:  Patient seen and examined at bedside this  morning Did have a temperature spike up to 100.7 Urinalysis does not show UTI Sepsis focus not identified at this time Blood cultures pending Denies nausea or vomiting  Physical Exam:  GENERAL: Elderly male laying in bed in no acute distress on intranasal oxygen   LUNGS: Decreased air entry bibasilarly CARDIOVASCULAR: Regular rate and rhythm, S1, S2 normal. No murmurs, rubs, or gallops.  ABDOMEN: Soft, nondistended, nontender. Bowel sounds present. No organomegaly or mass.  EXTREMITIES: No pedal edema, cyanosis, or clubbing.  NEUROLOGIC: Cranial nerves II through XII are intact. Muscle strength 5/5 in all extremities. Sensation intact. Gait not checked.  PSYCHIATRIC: The patient is alert and oriented x 3.  Normal affect and good eye contact. SKIN: No obvious rash, lesion, or ulcer.  Vitals:   05/25/24 1149 05/25/24 1150 05/25/24 1222 05/25/24 1604  BP: (!) 105/54 (!) 105/93 121/77 (!) 131/104  Pulse: (!) 101 (!) 101 96 (!) 108  Resp:   16 19  Temp: 98 F (36.7 C) 98 F (36.7 C)  97.6 F (36.4 C)  TempSrc: Oral Core    SpO2: 98% 99% 100% 93%  Weight:   80.8 kg   Height:   5' 6 (1.676 m)     Data Reviewed: Chest x-ray did not show any acute findings    Latest Ref Rng & Units 05/25/2024    5:14 AM 05/25/2024   12:30 AM 05/21/2024   12:16 PM  BMP  Glucose 70 - 99 mg/dL 97  873  890   BUN 8 - 23 mg/dL 24  25  24    Creatinine 0.61 -  1.24 mg/dL 9.00  8.92  8.93   Sodium 135 - 145 mmol/L 136  132  136   Potassium 3.5 - 5.1 mmol/L 3.6  3.7  4.5   Chloride 98 - 111 mmol/L 97  93  93   CO2 22 - 32 mmol/L 31  30  30    Calcium  8.9 - 10.3 mg/dL 8.0  8.3  8.8     Family Communication: None present at bedside   Time spent: 52 minutes  Author: Drue ONEIDA Potter, MD 05/25/2024 5:15 PM  For on call review [email or web address].

## 2024-05-25 NOTE — ED Notes (Signed)
 Pt noted to be lying in soiled linens, chux, and brief. This RN readjusted pt in bed. Pt cleaned up with bath wipes and placed on new sheet. New brief applied onto pt. Pt tolerated well.

## 2024-05-25 NOTE — H&P (Addendum)
 [place]   PATIENT NAME: Bradley Bowers    MR#:  [id number]  DATE OF BIRTH:  [birth year]  DATE OF ADMISSION:  05/21/2024  PRIMARY CARE PHYSICIAN: Cleotilde Oneil FALCON, MD   Patient is coming from: Home  REQUESTING/REFERRING PHYSICIAN: Ward, Josette SAILOR, DO  CHIEF COMPLAINT:   Chief Complaint  Patient presents with   Weakness    HISTORY OF PRESENT ILLNESS:  CHEY CHO is a 78 y.o. Caucasian male with medical history significant for hypertension, dyslipidemia, OSA, RLS, asthma, atrial fibrillation COPD, and PAD, who presented to the emergency room with acute onset of generalized weakness as well as dyspnea with associated cough now productive of yellow sputum.  He initially did not have any fever and Mir[place]ello temperature of 100.7 with tachycardia and tachypnea.  He had a fall from standing on Wednesday.  He is on no anticoagulation possibly due to fall risk.  He lives at home with his wife who has a hip fracture.  He has been boarding in the ED from 8/15.  The patient was recently admitted 8/7 here and discharged 8/9 for a fall and non-STEMI as well as A-fib with RVR.  ED Course: Vital signs revealed a temperature of 100.7/3.2, BP 103/58, resp.  Respiratory rate  20 and heart rate 98 with pulse currently of 100% on 3 L O2 via nasal cannula.  Labs revealed mild hyponatremia 132 and hypochloremia 93 with a BUN of 25 EKG as reviewed by me : EKG was A-fib with RVR of 127 and QTc of 488 MS. Imaging: Portable chest x-ray showed no acute cardiopulmonary disease. Most recent chest CTA on 05/21/2024 revealed no PE.  The patient was given 1 L of normal IV lactated Ringer , IV cefepime , vancomycin  and Flagyl .  He will be admitted to a medical telemetry observation bed for further evaluation and management. PAST MEDICAL HISTORY:   Past Medical History:  Diagnosis Date   Arterial vascular disease 10/24/2014   Overview:  MI 1994, small-vessel disease requiring coumadin    Asthma     Centrilobular emphysema (HCC) 10/24/2014   Overview:  2L O2 prn    Centrilobular emphysema (HCC)    Chronic obstructive pulmonary disease (HCC) 11/06/2015   Coronary artery disease    GERD (gastroesophageal reflux disease)    Glaucoma    Heart attack (HCC) 2/17, 1994   Heart disease    Hyperlipemia    Hypertension    Restless leg 10/24/2014   Sleep apnea     PAST SURGICAL HISTORY:   Past Surgical History:  Procedure Laterality Date   CATARACT EXTRACTION W/ INTRAOCULAR LENS  IMPLANT, BILATERAL     COLONOSCOPY WITH PROPOFOL  N/A 02/19/2018   Procedure: COLONOSCOPY WITH PROPOFOL ;  Surgeon: Gaylyn Gladis PENNER, MD;  Location: [facility] ENDOSCOPY;  Service: Endoscopy;  Laterality: N/A;   ESOPHAGOGASTRODUODENOSCOPY (EGD) WITH PROPOFOL  N/A 02/19/2018   Procedure: ESOPHAGOGASTRODUODENOSCOPY (EGD) WITH PROPOFOL ;  Surgeon: Gaylyn Gladis PENNER, MD;  Location: [facility] ENDOSCOPY;  Service: Endoscopy;  Laterality: N/A;   EYE SURGERY      SOCIAL HISTORY:   Social History   Tobacco Use   Smoking status: Former    Current packs/day: 0.00    Average packs/day: 1 pack/day for 30.0 years (30.0 ttl pk-yrs)    Types: Cigarettes    Start date: 03/07/1964    Quit date: 03/07/1994    Years since quitting: 30.2   Smokeless tobacco: Never  Substance Use Topics   Alcohol use: No  Alcohol/week: 0.0 standard drinks of alcohol    FAMILY HISTORY:   Family History  Problem Relation Age of Onset   Cancer Mother    Nephrolithiasis Father    Prostate cancer Neg Hx    Bladder Cancer Neg Hx    Kidney cancer Neg Hx     DRUG ALLERGIES:  No Known Allergies  REVIEW OF SYSTEMS:   ROS As per history of present illness. All pertinent systems were reviewed above. Constitutional, HEENT, cardiovascular, respiratory, GI, GU, musculoskeletal, neuro, psychiatric, endocrine, integumentary and hematologic systems were reviewed and are otherwise negative/unremarkable except for positive findings mentioned above in the  HPI.   MEDICATIONS AT HOME:   Prior to Admission medications   Medication Sig Start Date End Date Taking? Authorizing Provider  albuterol  (VENTOLIN  HFA) 108 (90 Base) MCG/ACT inhaler Inhale 1-2 puffs into the lungs every 4 (four) hours as needed.   Yes [provider]  aspirin  81 MG EC tablet Take 81 mg by mouth daily. 11/14/15  Yes [provider]  atorvastatin  (LIPITOR ) 80 MG tablet Take 80 mg by mouth daily.   Yes [provider]  benzonatate (TESSALON) 200 MG capsule Take 200 mg by mouth 3 (three) times daily as needed for cough. 04/13/24  Yes [provider]  carbamazepine  (TEGRETOL ) 200 MG tablet Take 1 tablet by mouth 2 (two) times daily. 01/12/21  Yes [provider]  cholecalciferol  (VITAMIN D3) 25 MCG (1000 UNIT) tablet Take 1,000 Units by mouth daily.   Yes [provider]  cyanocobalamin  (VITAMIN B12) 1000 MCG tablet Take 1,000 mcg by mouth daily.   Yes [provider]  diltiazem  (CARDIZEM  CD) 120 MG 24 hr capsule Take 120 mg by mouth daily.   Yes [provider]  divalproex  (DEPAKOTE  ER) 250 MG 24 hr tablet Take 500 mg by mouth daily.   Yes [provider]  donepezil  (ARICEPT ) 10 MG tablet Take 1 tablet by mouth at bedtime. 07/11/22  Yes [provider]  fluticasone  (FLONASE ) 50 MCG/ACT nasal spray Place 1 spray into both nostrils daily as needed for allergies.   Yes [provider]  fluticasone -salmeterol (ADVAIR) 100-50 MCG/ACT AEPB Inhale 1 puff into the lungs 2 (two) times daily.   Yes [provider]  guaiFENesin  (MUCINEX ) 600 MG 12 hr tablet Take 600 mg by mouth 2 (two) times daily as needed.   Yes [provider]  latanoprost  (XALATAN ) 0.005 % ophthalmic solution Place 1 drop into both eyes at bedtime.   Yes [provider]  mometasone -formoterol  (DULERA ) 100-5 MCG/ACT AERO Inhale 2 puffs into the lungs 2 (two) times daily. 10/04/22  Yes Akula, Vijaya, MD   montelukast  (SINGULAIR ) 10 MG tablet Take 10 mg by mouth at bedtime.   Yes [provider]  OLANZapine  (ZYPREXA ) 5 MG tablet Take 5 mg by mouth at bedtime.   Yes [provider]  pantoprazole  (PROTONIX ) 40 MG tablet Take 40 mg by mouth 2 (two) times daily. 07/11/22  Yes [provider]  timolol  (TIMOPTIC ) 0.5 % ophthalmic solution 1 drop daily.   Yes [provider]  tiotropium (SPIRIVA ) 18 MCG inhalation capsule Place 18 mcg into inhaler and inhale daily.   Yes [provider]  TRAVATAN Z 0.004 % SOLN ophthalmic solution Place 1 drop into both eyes at bedtime. 09/16/22  Yes [provider]  feeding supplement (ENSURE ENLIVE / ENSURE PLUS) LIQD Take 237 mLs by mouth 2 (two) times daily between meals. 10/05/22   Akula, Vijaya,  MD      VITAL SIGNS:  Blood pressure (!) 112/58, pulse 88, temperature 99.2 F (37.3 C), temperature source Oral, resp. rate 17, height 5' 7 (1.702 m), weight 83 kg, SpO2 100%.  PHYSICAL EXAMINATION:  Physical Exam  GENERAL:  78 y.o.-year-old Caucasian male patient lying in the bed with no acute distress.  EYES: Pupils equal, round, reactive to light and accommodation. No scleral icterus. Extraocular muscles intact.  HEENT: Head atraumatic, normocephalic. Oropharynx and nasopharynx clear.  NECK:  Supple, no jugular venous distention. No thyroid  enlargement, no tenderness.  LUNGS: Diminished bibasilar breath sounds with bibasilar rales, as well as expiratory wheezes with tight expiratory airflow and harsh vesicular breathing.  No use of accessory muscles of respiration.  CARDIOVASCULAR: Regular rate and rhythm, S1, S2 normal. No murmurs, rubs, or gallops.  ABDOMEN: Soft, nondistended, nontender. Bowel sounds present. No organomegaly or mass.  EXTREMITIES: No pedal edema, cyanosis, or clubbing.  NEUROLOGIC: Cranial nerves II through XII are intact. Muscle strength 5/5 in all extremities. Sensation intact. Gait not  checked.  PSYCHIATRIC: The patient is alert and oriented x 3.  Normal affect and good eye contact. SKIN: No obvious rash, lesion, or ulcer.   LABORATORY PANEL:   CBC Recent Labs  Lab 05/25/24 0514  WBC 9.3  HGB 9.2*  HCT 27.3*  PLT 146*   ------------------------------------------------------------------------------------------------------------------  Chemistries  Recent Labs  Lab 05/21/24 1216 05/25/24 0030 05/25/24 0514  NA 136 132* 136  K 4.5 3.7 3.6  CL 93* 93* 97*  CO2 30 30 31   GLUCOSE 109* 126* 97  BUN 24* 25* 24*  CREATININE 1.06 1.07 0.99  CALCIUM  8.8* 8.3* 8.0*  MG 1.7  --   --   AST 59* 37  --   ALT 56* 31  --   ALKPHOS 73 77  --   BILITOT 1.4* 1.0  --    ------------------------------------------------------------------------------------------------------------------  Cardiac Enzymes No results for input(s): TROPONINI in the last 168 hours. ------------------------------------------------------------------------------------------------------------------  RADIOLOGY:  DG Chest Portable 1 View Result Date: 05/24/2024 CLINICAL DATA:  Fever and weakness EXAM: PORTABLE CHEST 1 VIEW COMPARISON:  05/13/2024 FINDINGS: Stable cardiomediastinal silhouette. Similar interstitial coarsening and bronchitic changes. No focal consolidation, pleural effusion, or pneumothorax. IMPRESSION: No acute process. Electronically Signed   By: Norman Gatlin M.D.   On: 05/24/2024 23:46      IMPRESSION AND PLAN:  Assessment and Plan: * Sepsis due to undetermined organism [employer]) - The patient will be admitted to a medical telemetry bed. - Will continue antibiotic therapy with IV Rocephin  and Zithromax  for likely lower respiratory infection and possibly early pneumonia. - Mucolytic therapy be provided as well as duo nebs q.i.d. and q.4 hours p.r.n. - We will follow blood cultures. - Sepsis is manifested by fever, tachycardia and tachypnea, and leukocytosis.   Paroxysmal  atrial fibrillation with RVR (HCC) - The patient is currently better. - Will continue Cardizem  CD. - The patient is not on anticoagulation due to fall risk.  COPD with acute exacerbation (HCC) - This is secondary to #1. - The patient will be placed on bronchodilator therapy as well as steroid therapy and mucolytics.  Bipolar 1 disorder (HCC) - Will continue Depakote , Depakote  and Zyprexa .  Dementia (HCC) - Will continue Aricept .  Dyslipidemia - Continue statin therapy.  GERD without esophagitis - Will continue PPI therapy.   DVT prophylaxis: Lovenox .  Advanced Care Planning:  Code Status: The patient is DNR and DNI Family Communication:  The plan of care was  discussed in details with the patient (and family). I answered all questions. The patient agreed to proceed with the above mentioned plan. Further management will depend upon hospital course. Disposition Plan: Back to previous home environment Consults called: none.  All the records are reviewed and case discussed with ED provider.  Status is: Observation  I certify that at the time of admission, it is my clinical judgment that the patient will require  hospital care extending less  than 2 midnights.                            Dispo: The patient is from: Home              Anticipated d/c is to: Home              Patient currently is not medically stable to d/c.              Difficult to place patient: No  Madison DELENA Peaches M.D on 05/25/2024 at 6:26 AM  [facility]   From 7 PM-7 AM, contact night-coverage [email or web address]  CC: Primary care physician; Cleotilde Oneil FALCON, MD

## 2024-05-25 NOTE — Progress Notes (Signed)
 Occupational Therapy Treatment Patient Details Name: Bradley Bowers MRN: [id number] DOB: Oct 10, 1945 Today's Date: 05/25/2024   History of present illness comes via EMs from home with c/o weakness. Pt states he was seen ehre last week for same and falls. Pt was admitted and dc last Saturday. Pt has been getting more week since at home. Pt had another fall on Wed. Pt denies any injuries or pain anywhere.   OT comments  [name] was seen for OT treatment on this date. Upon arrival to room pt supine in hall bed, agreeable to OT Tx session. OT facilitated ADL management with education and assistance as described below. See ADL section for additional details regarding occupational performance. Pt continues to be functionally limited by generalized weakness, decreased balance, and decreased activity tolerance. Pt return verbalizes understanding of education provided t/o session. Pt is progressing toward OT goals and continues to benefit from skilled OT services to maximize return to PLOF and minimize risk of future falls, injury, and readmission. Will continue to follow POC as written. Discharge recommendation remains appropriate.        If plan is discharge home, recommend the following:  A lot of help with walking and/or transfers;A lot of help with bathing/dressing/bathroom;Assistance with cooking/housework;Help with stairs or ramp for entrance;Assist for transportation   Equipment Recommendations  None recommended by OT    Recommendations for Other Services      Precautions / Restrictions Precautions Precautions: Fall Recall of Precautions/Restrictions: Intact Restrictions Weight Bearing Restrictions Per Provider Order: No       Mobility Bed Mobility Overal bed mobility: Needs Assistance Bed Mobility: Supine to Sit, Sit to Supine     Supine to sit: Mod assist Sit to supine: Mod assist        Transfers Overall transfer level: Needs assistance Equipment used: 1 person hand  held assist Transfers: Sit to/from Stand Sit to Stand: Mod assist     Step pivot transfers: Mod assist     General transfer comment: MOD A to side step to [facility]     Balance Overall balance assessment: Needs assistance, History of Falls Sitting-balance support: Feet supported, Bilateral upper extremity supported Sitting balance-[name]: Fair     Standing balance support: Bilateral upper extremity supported, During functional activity, Reliant on assistive device for balance Standing balance-[name]: Poor Standing balance comment: requires MIN A for static standing balance, MOD A for dynamic.                           ADL either performed or assessed with clinical judgement   ADL Overall ADL's : Needs assistance/impaired                                     Functional mobility during ADLs: Moderate assistance;Cueing for safety;Cueing for sequencing General ADL Comments: Pt reports feeling weaker today. Educated on importance of bed level activity and safe transfer technique t/o session. Limited by dizziness and fatigue this date. Requires MOD A HHA to take 3 small side steps toward [facility] with seated rest breaks between steps. Pt is functionally limited by height of gurney bed this date.    Extremity/Trunk Assessment              Vision Patient Visual Report: No change from baseline     Perception     Praxis     Communication Communication  Communication: No apparent difficulties   Cognition Arousal: Alert Behavior During Therapy: WFL for tasks assessed/performed Cognition: No apparent impairments                               Following commands: Intact        Cueing   Cueing Techniques: Verbal cues  Exercises Other Exercises Other Exercises: Reinforcement of prior education about role of OT in acute setting, safety, falls prevention strateagies, safe transfer technique and DC recs.    Shoulder Instructions        General Comments Vss t/o session. SpO2 remains WNL (>/= 92%) with pt on 3L t/o session    Pertinent Vitals/ Pain       Pain Assessment Pain Assessment: Faces Faces Pain Scale: Hurts a little bit Pain Location: generalized from falling. Bruises on body Pain Descriptors / Indicators: Discomfort, Sore Pain Intervention(s): Limited activity within patient's tolerance, Monitored during session, Repositioned  Home Living Family/patient expects to be discharged to:: Skilled nursing facility                                        Prior Functioning/Environment              Frequency  Min 2X/week        Progress Toward Goals  OT Goals(current goals can now be found in the care plan section)  Progress towards OT goals: Progressing toward goals  Acute Rehab OT Goals Patient Stated Goal: To go home OT Goal Formulation: With patient Time For Goal Achievement: 06/05/24 Potential to Achieve Goals: Good  Plan      Co-evaluation                 AM-PAC OT 6 Clicks Daily Activity     Outcome Measure   Help from another person eating meals?: None Help from another person taking care of personal grooming?: A Little Help from another person toileting, which includes using toliet, bedpan, or urinal?: A Lot Help from another person bathing (including washing, rinsing, drying)?: A Lot Help from another person to put on and taking off regular upper body clothing?: A Little Help from another person to put on and taking off regular lower body clothing?: A Lot 6 Click Score: 16    End of Session Equipment Utilized During Treatment: Gait belt;Rolling walker (2 wheels)  OT Visit Diagnosis: Other abnormalities of gait and mobility (R26.89);Muscle weakness (generalized) (M62.81)   Activity Tolerance Patient tolerated treatment well   Patient Left in bed;with call bell/phone within reach;with bed alarm set   Nurse Communication Mobility status        Time:  8884-8871 OT Time Calculation (min): 13 min  Charges: OT General Charges $OT Visit: 1 Visit OT Treatments $Self Care/Home Management : 8-22 mins  Jhonny Pelton, M.S., OTR/L 05/25/24, 3:05 PM

## 2024-05-25 NOTE — Assessment & Plan Note (Signed)
-   Will continue Aricept .

## 2024-05-25 NOTE — Assessment & Plan Note (Addendum)
-   The patient will be admitted to a medical telemetry bed. - Will continue antibiotic therapy with IV Rocephin  and Zithromax  for likely lower respiratory infection and possibly early pneumonia. - Mucolytic therapy be provided as well as duo nebs q.i.d. and q.4 hours p.r.n. - We will follow blood cultures. - Sepsis is manifested by fever, tachycardia and tachypnea, and leukocytosis.

## 2024-05-25 NOTE — Assessment & Plan Note (Signed)
-   Will continue PPI therapy.

## 2024-05-25 NOTE — Progress Notes (Signed)
 Physical Therapy Treatment Patient Details Name: Bradley Bowers MRN: [id number] DOB: 05/04/1946 Today's Date: 05/25/2024   History of Present Illness P comes via EMs from home with c/o weakness. Pt states he was seen here last week for same and falls. Pt was admitted and d/c last Saturday. Pt has been getting more weak since at home. Pt had another fall on Wed. Pt denies any injuries or pain anywhere.    PT Comments  Pt resting in bed upon PT arrival; pt agreeable to therapy; no c/o pain during session.  Pt SBA semi-supine to sitting EOB; min assist to stand up to RW; and CGA to ambulate a few feet bed to recliner with RW use.  Limited activity/ambulation distance d/t pt fatigue, generalized weakness, and SOB (SpO2 sats 93% or greater on 2 L O2 via nasal canula and HR 93-113 bpm during sessions activities).  Will continue to focus on strengthening, endurance, and progressive functional mobility during hospitalization.   If plan is discharge home, recommend the following: A little help with walking and/or transfers;A little help with bathing/dressing/bathroom;Assistance with cooking/housework;Assist for transportation;Help with stairs or ramp for entrance   Can travel by private vehicle        Equipment Recommendations  Rolling walker (2 wheels);BSC/3in1    Recommendations for Other Services       Precautions / Restrictions Precautions Precautions: Fall Recall of Precautions/Restrictions: Intact Restrictions Weight Bearing Restrictions Per Provider Order: No     Mobility  Bed Mobility Overal bed mobility: Needs Assistance Bed Mobility: Supine to Sit     Supine to sit: Supervision, HOB elevated     General bed mobility comments: mild increased effort to perform on own    Transfers Overall transfer level: Needs assistance Equipment used: Rolling walker (2 wheels) Transfers: Sit to/from Stand Sit to Stand: Min assist           General transfer comment: vc's for  overall technique and reaching back prior to sitting    Ambulation/Gait Ambulation/Gait assistance: Contact guard assist Gait Distance (Feet): 3 Feet (bed to recliner) Assistive device: Rolling walker (2 wheels)   Gait velocity: decreased     General Gait Details: small steps bed to recliner; limited by generalized weakness, fatigue, and SOB   Stairs             Wheelchair Mobility     Tilt Bed    Modified Rankin (Stroke Patients Only)       Balance Overall balance assessment: Needs assistance, History of Falls Sitting-balance support: No upper extremity supported, Feet supported Sitting balance-[name]: Good Sitting balance - Comments: steady reaching within BOS   Standing balance support: Bilateral upper extremity supported, Reliant on assistive device for balance Standing balance-[name]: Fair Standing balance comment: steady static standing with B UE support on RW                            Communication Communication Communication: No apparent difficulties  Cognition Arousal: Alert Behavior During Therapy: WFL for tasks assessed/performed   PT - Cognitive impairments: No apparent impairments                         Following commands: Intact      Cueing Cueing Techniques: Verbal cues  Exercises      General Comments Nursing cleared pt for participation in physical therapy.  Pt agreeable to PT session.  Pertinent Vitals/Pain Pain Assessment Pain Assessment: 0-10 Pain Score: 0-No pain Pain Intervention(s): Limited activity within patient's tolerance, Monitored during session, Repositioned    Home Living                      Prior Function            PT Goals (current goals can now be found in the care plan section) Acute Rehab PT Goals Patient Stated Goal: get stronger and go back home PT Goal Formulation: With patient Time For Goal Achievement: 06/05/24 Potential to Achieve Goals:  Good Progress towards PT goals: Progressing toward goals    Frequency    Min 2X/week      PT Plan      Co-evaluation              AM-PAC PT 6 Clicks Mobility   Outcome Measure  Help needed turning from your back to your side while in a flat bed without using bedrails?: None Help needed moving from lying on your back to sitting on the side of a flat bed without using bedrails?: A Little Help needed moving to and from a bed to a chair (including a wheelchair)?: A Little Help needed standing up from a chair using your arms (e.g., wheelchair or bedside chair)?: A Little Help needed to walk in hospital room?: A Lot Help needed climbing 3-5 steps with a railing? : A Lot 6 Click Score: 17    End of Session Equipment Utilized During Treatment: Gait belt;Oxygen  (2 L via nasal cannula) Activity Tolerance: Patient tolerated treatment well Patient left: in chair;with call bell/phone within reach;with chair alarm set Nurse Communication: Mobility status;Precautions;Other (comment) (Pt's condom catheter falling off during mobility and needing to be addressed) PT Visit Diagnosis: Muscle weakness (generalized) (M62.81);Repeated falls (R29.6);Unsteadiness on feet (R26.81);Difficulty in walking, not elsewhere classified (R26.2)     Time: 8379-8355 PT Time Calculation (min) (ACUTE ONLY): 24 min  Charges:    $Therapeutic Activity: 23-37 mins PT General Charges $$ ACUTE PT VISIT: 1 Visit                     Damien Caulk, PT 05/25/24, 4:54 PM

## 2024-05-26 DIAGNOSIS — A419 Sepsis, unspecified organism: Secondary | ICD-10-CM | POA: Diagnosis not present

## 2024-05-26 LAB — CBC WITH DIFFERENTIAL/PLATELET
Abs Immature Granulocytes: 0.06 K/uL (ref 0.00–0.07)
Basophils Absolute: 0 K/uL (ref 0.0–0.1)
Basophils Relative: 0 %
Eosinophils Absolute: 0 K/uL (ref 0.0–0.5)
Eosinophils Relative: 0 %
HCT: 30.6 % — ABNORMAL LOW (ref 39.0–52.0)
Hemoglobin: 10.2 g/dL — ABNORMAL LOW (ref 13.0–17.0)
Immature Granulocytes: 1 %
Lymphocytes Relative: 16 %
Lymphs Abs: 1.4 K/uL (ref 0.7–4.0)
MCH: 30.8 pg (ref 26.0–34.0)
MCHC: 33.3 g/dL (ref 30.0–36.0)
MCV: 92.4 fL (ref 80.0–100.0)
Monocytes Absolute: 0.6 K/uL (ref 0.1–1.0)
Monocytes Relative: 7 %
Neutro Abs: 6.3 K/uL (ref 1.7–7.7)
Neutrophils Relative %: 76 %
Platelets: 187 K/uL (ref 150–400)
RBC: 3.31 MIL/uL — ABNORMAL LOW (ref 4.22–5.81)
RDW: 13.8 % (ref 11.5–15.5)
WBC: 8.3 K/uL (ref 4.0–10.5)
nRBC: 0 % (ref 0.0–0.2)

## 2024-05-26 LAB — BASIC METABOLIC PANEL WITH GFR
Anion gap: 8 (ref 5–15)
BUN: 24 mg/dL — ABNORMAL HIGH (ref 8–23)
CO2: 32 mmol/L (ref 22–32)
Calcium: 8.7 mg/dL — ABNORMAL LOW (ref 8.9–10.3)
Chloride: 101 mmol/L (ref 98–111)
Creatinine, Ser: 0.88 mg/dL (ref 0.61–1.24)
GFR, Estimated: >60 mL/min (ref >60)
Glucose, Bld: 102 mg/dL — ABNORMAL HIGH (ref 70–99)
Potassium: 4.3 mmol/L (ref 3.5–5.1)
Sodium: 141 mmol/L (ref 135–145)

## 2024-05-26 MED ORDER — METHYLPREDNISOLONE SODIUM SUCC 40 MG IJ SOLR
40.0000 mg | Freq: Every day | INTRAMUSCULAR | Status: DC
  Administered 2024-05-26 – 2024-05-27 (×2): 40 mg via INTRAVENOUS
  Filled 2024-05-26 (×2): qty 1

## 2024-05-26 NOTE — TOC Progression Note (Addendum)
 Transition of Care [facility]) - Progression Note    Patient Details  Name: Bradley Bowers MRN: [id number] Date of Birth: 1946/05/13  Transition of Care [facility]) CM/SW Contact  Dalia GORMAN Fuse, RN Phone Number: 05/26/2024, 9:52 AM  Clinical Narrative:    Patient remains inpatient appropriate, he is on IV abx for sepsis. [facility] has offered a bed. Nitchia to start insurance auth. TOC will continue to follow.  Approved [id number] Dates: 8/20-8/22/2025   TOC outreached to Tammy at [facility] with the auth. The patient can come to the facility tomorrow.                   Expected Discharge Plan and Services                                               Social Drivers of Health (SDOH) Interventions SDOH Screenings   Food Insecurity: No Food Insecurity (05/25/2024)  Housing: Low Risk  (05/25/2024)  Transportation Needs: No Transportation Needs (05/25/2024)  Utilities: Not At Risk (05/25/2024)  Alcohol Screen: Low Risk  (08/16/2017)  Depression (PHQ2-9): Low Risk  (04/05/2021)  Financial Resource Strain: Low Risk  (04/30/2023)   Received from [facility]  Social Connections: Socially Isolated (05/25/2024)  Tobacco Use: Medium Risk (05/21/2024)    Readmission Risk Interventions    05/14/2024   11:34 AM  Readmission Risk Prevention Plan  Transportation Screening Complete  PCP or Specialist Appt within 3-5 Days Complete  Social Work Consult for Recovery Care Planning/Counseling Complete  Palliative Care Screening Not Applicable  Medication Review Oceanographer) Complete

## 2024-05-26 NOTE — Care Management Obs Status (Signed)
 MEDICARE OBSERVATION STATUS NOTIFICATION   Patient Details  Name: Bradley Bowers MRN: [id number] Date of Birth: 1946-02-18   Medicare Observation Status Notification Given:  Yes    Phillip Sandler W, CMA 05/26/2024, 11:03 AM

## 2024-05-26 NOTE — Plan of Care (Signed)

## 2024-05-26 NOTE — Progress Notes (Signed)
 PROGRESS NOTE    Bradley Bowers   FMW:[id number] DOB: December 01, 1945  DOA: 05/21/2024 Date of Service: 05/26/24 which is hospital day 0  PCP: Bradley Oneil FALCON, MD    Hospital course / significant events:   HPI: Bradley Bowers is a 78 y.o. Caucasian male with medical history significant for hypertension, dyslipidemia, OSA, RLS, asthma, atrial fibrillation COPD, and PAD, who presented to the emergency room with acute onset of generalized weakness as well as dyspnea with associated cough now productive of yellow sputum.    Recently discharged from hospital 05/15/24 for NSTEMI, Afib RVR.   08/15: to ED via EMS from home w/ weakness. Plan at this time was to go to SNF rehab which he declined on last admisison.  08/16-08/17: holding in ED.  08/18: Febrile, Afib RVR, leukocytosis. Hospitalist team contacted  08/19: admitted early AM to hospitalist service, Afib RVR resolved, treating for COPD exacerbation w/ SIRS / Sepsis pneumonia or other. Continued abx and held steroids d/t concern for sepsis.  08/20: low procal, pt symptoms more c/w viral illness (fatigue, muscle aches), probably not much utility in broad resp PCR at this point but will give steroids for COPD and hold abx and monitor - if this seems to improve into tomorrow may consider dc home     Consultants:  none  Procedures/Surgeries: none      ASSESSMENT & PLAN:   (+)SIRS likely d/t Afib/RVR, COPD Question sepsis due to undetermined organism  SIRS: WBC <4 or >12 - yes initial WBC 12.7 Temp <96.8 or >100.4 - yes TMax 100.7 HR >90 - yes but Afib confounding factor  RR >20 - yes  Sepsis / source infection? None identified Cannot confirm sepsis - pt was started on abx for presumed pneumonia but there is no evidence for pneumonia, likely COPD / question viral respiratory illness. Treat COPD as below  SIRS likely d/t COPD, Afib --> treat underlying cause(s) as below     Paroxysmal atrial fibrillation with RVR  Rate  improved continue Cardizem  CD. not on anticoagulation due to fall risk. Continue ASA   COPD with acute exacerbation  Continue nebulization tx (albuterol  prn), Incruse ellipta  Continue prn mucolytics/antitussives Steroid therapy was held in the setting of sepsis but not convinced of bacterial pneumonia, will initiate steroids now  Continue w/ azithromycin , can dc rocephin     Bipolar 1 disorder  Continue Depakote , Zyprexa , Tegretol     Dementia  continue Aricept .   Dyslipidemia Continue statin    GERD without esophagitis continue PPI     overweight based on BMI: Body mass index is 28.75 kg/m.SABRA Significantly low or high BMI is associated with higher medical risk.  Underweight - under 18  overweight - 25 to 29 obese - 30 or more Class 1 obesity: BMI of 30.0 to 34 Class 2 obesity: BMI of 35.0 to 39 Class 3 obesity: BMI of 40.0 to 49 Super Morbid Obesity: BMI 50-59 Super-super Morbid Obesity: BMI 60+ Healthy nutrition and physical activity advised as adjunct to other disease management and risk reduction treatments    DVT prophylaxis: lovenox  IV fluids: no continuous IV fluids  Nutrition: cardiac diet  Central lines / other devices: none  Code Status: DNR ACP documentation reviewed:  none on file in [facility]  TOC needs: SF placement  Medical barriers to dispo: O2 requirement. Expected medical readiness for discharge hopefully tomorrow / Friday.              Subjective / Brief ROS:  Patient reports still  some SOB w/ activity Confirms history re: aching/weak for awhile then fever/chills and SOB Denies CP/SOB now at rest Pain controlled.  Denies new weakness.  Tolerating diet.  Reports no concerns w/ urination/defecation.   Family Communication: will call wife this afternoon     Objective Findings:  Vitals:   05/25/24 1604 05/25/24 1942 05/26/24 0527 05/26/24 0818  BP: (!) 131/104 116/86 (!) 109/59 108/65  Pulse: (!) 108 88 77 82  Resp: 19 20 16 19    Temp: 97.6 F (36.4 C) 97.6 F (36.4 C) (!) 97.4 F (36.3 C) 97.6 F (36.4 C)  TempSrc:      SpO2: 93% 99% 99% 98%  Weight:      Height:        Intake/Output Summary (Last 24 hours) at 05/26/2024 1549 Last data filed at 05/26/2024 1300 Gross per 24 hour  Intake 2966.23 ml  Output 1000 ml  Net 1966.23 ml   Filed Weights   05/21/24 1057 05/25/24 1222  Weight: 83 kg 80.8 kg    Examination:  Physical Exam Constitutional:      General: He is not in acute distress. Cardiovascular:     Rate and Rhythm: Normal rate and regular rhythm.  Pulmonary:     Effort: Pulmonary effort is normal. No respiratory distress.     Breath sounds: Decreased air movement present. Wheezing present. No rhonchi or rales.  Neurological:     Mental Status: He is alert.          Scheduled Medications:   aspirin  EC  81 mg Oral Daily   atorvastatin   80 mg Oral Daily   carbamazepine   200 mg Oral BID   cholecalciferol   1,000 Units Oral Daily   cyanocobalamin   1,000 mcg Oral Daily   diltiazem   120 mg Oral Daily   divalproex   500 mg Oral Daily   donepezil   10 mg Oral QHS   enoxaparin  (LOVENOX ) injection  40 mg Subcutaneous Q24H   feeding supplement  237 mL Oral BID BM   guaiFENesin   600 mg Oral BID   latanoprost   1 drop Both Eyes QHS   montelukast   10 mg Oral QHS   OLANZapine   5 mg Oral QHS   pantoprazole   40 mg Oral BID   timolol   1 drop Both Eyes Daily   umeclidinium bromide   1 puff Inhalation Daily    Continuous Infusions:  azithromycin  500 mg (05/26/24 1100)   cefTRIAXone  (ROCEPHIN )  IV 2 g (05/26/24 1343)    PRN Medications:  acetaminophen  **OR** acetaminophen , albuterol , chlorpheniramine-HYDROcodone , magnesium  hydroxide, ondansetron  **OR** ondansetron  (ZOFRAN ) IV, traZODone   Antimicrobials from admission:  Anti-infectives (From admission, onward)    Start     Dose/Rate Route Frequency Ordered Stop   05/25/24 1000  cefTRIAXone  (ROCEPHIN ) 2 g in sodium chloride  0.9 % 100 mL IVPB         2 g 200 mL/hr over 30 Minutes Intravenous Every 24 hours 05/25/24 0442 05/30/24 0959   05/25/24 0800  cefTRIAXone  (ROCEPHIN ) 2 g in sodium chloride  0.9 % 100 mL IVPB  Status:  Discontinued        2 g 200 mL/hr over 30 Minutes Intravenous Every 24 hours 05/25/24 0433 05/25/24 0442   05/25/24 0800  azithromycin  (ZITHROMAX ) 500 mg in sodium chloride  0.9 % 250 mL IVPB        500 mg 250 mL/hr over 60 Minutes Intravenous Every 24 hours 05/25/24 0433 05/30/24 0759   05/25/24 0000  ceFEPIme  (MAXIPIME ) 2 g in sodium chloride   0.9 % 100 mL IVPB        2 g 200 mL/hr over 30 Minutes Intravenous  Once 05/24/24 2346 05/25/24 0110   05/25/24 0000  metroNIDAZOLE  (FLAGYL ) IVPB 500 mg        500 mg 100 mL/hr over 60 Minutes Intravenous  Once 05/24/24 2346 05/25/24 0326   05/25/24 0000  vancomycin  (VANCOCIN ) IVPB 1000 mg/200 mL premix  Status:  Discontinued        1,000 mg 200 mL/hr over 60 Minutes Intravenous  Once 05/24/24 2346 05/24/24 2350   05/25/24 0000  vancomycin  (VANCOREADY) IVPB 2000 mg/400 mL        2,000 mg 200 mL/hr over 120 Minutes Intravenous  Once 05/24/24 2350 05/25/24 9376           Data Reviewed:  I have personally reviewed the following...  CBC: Recent Labs  Lab 05/21/24 1216 05/25/24 0030 05/25/24 0514 05/26/24 0515  WBC 16.4* 12.7* 9.3 8.3  NEUTROABS  --  10.9*  --  6.3  HGB 11.8* 10.5* 9.2* 10.2*  HCT 34.2* 31.1* 27.3* 30.6*  MCV 87.9 89.1 90.4 92.4  PLT 182 183 146* 187   Basic Metabolic Panel: Recent Labs  Lab 05/21/24 1216 05/25/24 0030 05/25/24 0514 05/26/24 0515  NA 136 132* 136 141  K 4.5 3.7 3.6 4.3  CL 93* 93* 97* 101  CO2 30 30 31  32  GLUCOSE 109* 126* 97 102*  BUN 24* 25* 24* 24*  CREATININE 1.06 1.07 0.99 0.88  CALCIUM  8.8* 8.3* 8.0* 8.7*  MG 1.7  --   --   --    GFR: Estimated Creatinine Clearance: 71.3 mL/min (by C-G formula based on SCr of 0.88 mg/dL). Liver Function Tests: Recent Labs  Lab 05/21/24 1216 05/25/24 0030  AST  59* 37  ALT 56* 31  ALKPHOS 73 77  BILITOT 1.4* 1.0  PROT 7.1 6.8  ALBUMIN 3.0* 2.7*   No results for input(s): LIPASE, AMYLASE in the last 168 hours. No results for input(s): AMMONIA in the last 168 hours. Coagulation Profile: Recent Labs  Lab 05/25/24 0514  INR 1.1   Cardiac Enzymes: No results for input(s): CKTOTAL, CKMB, CKMBINDEX, TROPONINI in the last 168 hours. BNP (last 3 results) No results for input(s): PROBNP in the last 8760 hours. HbA1C: No results for input(s): HGBA1C in the last 72 hours. CBG: Recent Labs  Lab 05/25/24 1141  GLUCAP 145*   Lipid Profile: No results for input(s): CHOL, HDL, LDLCALC, TRIG, CHOLHDL, LDLDIRECT in the last 72 hours. Thyroid  Function Tests: No results for input(s): TSH, T4TOTAL, FREET4, T3FREE, THYROIDAB in the last 72 hours. Anemia Panel: No results for input(s): VITAMINB12, FOLATE, FERRITIN, TIBC, IRON, RETICCTPCT in the last 72 hours. Most Recent Urinalysis On File:     Component Value Date/Time   COLORURINE YELLOW (A) 05/25/2024 0137   APPEARANCEUR HAZY (A) 05/25/2024 0137   APPEARANCEUR Clear 02/20/2016 1102   LABSPEC 1.023 05/25/2024 0137   PHURINE 6.0 05/25/2024 0137   GLUCOSEU NEGATIVE 05/25/2024 0137   HGBUR SMALL (A) 05/25/2024 0137   BILIRUBINUR NEGATIVE 05/25/2024 0137   BILIRUBINUR Negative 02/20/2016 1102   KETONESUR NEGATIVE 05/25/2024 0137   PROTEINUR 100 (A) 05/25/2024 0137   NITRITE NEGATIVE 05/25/2024 0137   LEUKOCYTESUR NEGATIVE 05/25/2024 0137   Sepsis Labs: @LABRCNTIP (procalcitonin:4,lacticidven:4) Microbiology: Recent Results (from the past 240 hours)  Resp panel by RT-PCR (RSV, Flu A&B, Covid) Anterior Nasal Swab     Status: None   Collection Time: 05/24/24 11:44 PM  Specimen: Anterior Nasal Swab  Result Value Ref Range Status   SARS Coronavirus 2 by RT PCR NEGATIVE NEGATIVE Final    Comment: (NOTE) SARS-CoV-2 target nucleic acids are NOT  DETECTED.  The SARS-CoV-2 RNA is generally detectable in upper respiratory specimens during the acute phase of infection. The lowest concentration of SARS-CoV-2 viral copies this assay can detect is 138 copies/mL. A negative result does not preclude SARS-Cov-2 infection and should not be used as the sole basis for treatment or other patient management decisions. A negative result may occur with  improper specimen collection/handling, submission of specimen other than nasopharyngeal swab, presence of viral mutation(s) within the areas targeted by this assay, and inadequate number of viral copies(<138 copies/mL). A negative result must be combined with clinical observations, patient history, and epidemiological information. The expected result is Negative.  Fact Sheet for Patients:  BloggerCourse.com  Fact Sheet for Healthcare Providers:  SeriousBroker.it  This test is no t yet approved or cleared by the [place]  FDA and  has been authorized for detection and/or diagnosis of SARS-CoV-2 by FDA under an Emergency Use Authorization (EUA). This EUA will remain  in effect (meaning this test can be used) for the duration of the COVID-19 declaration under Section 564(b)(1) of the Act, 21 U.S.C.section 360bbb-3(b)(1), unless the authorization is terminated  or revoked sooner.       Influenza A by PCR NEGATIVE NEGATIVE Final   Influenza B by PCR NEGATIVE NEGATIVE Final    Comment: (NOTE) The Xpert Xpress SARS-CoV-2/FLU/RSV plus assay is intended as an aid in the diagnosis of influenza from Nasopharyngeal swab specimens and should not be used as a sole basis for treatment. Nasal washings and aspirates are unacceptable for Xpert Xpress SARS-CoV-2/FLU/RSV testing.  Fact Sheet for Patients: BloggerCourse.com  Fact Sheet for Healthcare Providers: SeriousBroker.it  This test is not yet  approved or cleared by the [place]  FDA and has been authorized for detection and/or diagnosis of SARS-CoV-2 by FDA under an Emergency Use Authorization (EUA). This EUA will remain in effect (meaning this test can be used) for the duration of the COVID-19 declaration under Section 564(b)(1) of the Act, 21 U.S.C. section 360bbb-3(b)(1), unless the authorization is terminated or revoked.     Resp Syncytial Virus by PCR NEGATIVE NEGATIVE Final    Comment: (NOTE) Fact Sheet for Patients: BloggerCourse.com  Fact Sheet for Healthcare Providers: SeriousBroker.it  This test is not yet approved or cleared by the [place]  FDA and has been authorized for detection and/or diagnosis of SARS-CoV-2 by FDA under an Emergency Use Authorization (EUA). This EUA will remain in effect (meaning this test can be used) for the duration of the COVID-19 declaration under Section 564(b)(1) of the Act, 21 U.S.C. section 360bbb-3(b)(1), unless the authorization is terminated or revoked.  Performed at [facility], [address] [place], [place] [address]   Blood culture (routine x 2)     Status: None (Preliminary result)   Collection Time: 05/25/24 12:08 AM   Specimen: BLOOD  Result Value Ref Range Status   Specimen Description BLOOD ASSIST CONTROL  Final   Special Requests   Final    BOTTLES DRAWN AEROBIC AND ANAEROBIC Blood Culture results may not be optimal due to an excessive volume of blood received in culture bottles   Culture   Final    NO GROWTH 1 DAY Performed at [facility], [address], [place], [place] [address]    Report Status PENDING  Incomplete  Blood  culture (routine x 2)     Status: None (Preliminary result)   Collection Time: 05/25/24 12:30 AM   Specimen: BLOOD  Result Value Ref Range Status   Specimen Description BLOOD BLOOD RIGHT HAND  Final   Special Requests   Final    BOTTLES DRAWN AEROBIC  AND ANAEROBIC Blood Culture adequate volume   Culture   Final    NO GROWTH 1 DAY Performed at [facility], [address], [place], [place] [address]    Report Status PENDING  Incomplete      Radiology Studies last 3 days: DG Chest Portable 1 View Result Date: 05/24/2024 CLINICAL DATA:  Fever and weakness EXAM: PORTABLE CHEST 1 VIEW COMPARISON:  05/13/2024 FINDINGS: Stable cardiomediastinal silhouette. Similar interstitial coarsening and bronchitic changes. No focal consolidation, pleural effusion, or pneumothorax. IMPRESSION: No acute process. Electronically Signed   By: Norman Gatlin M.D.   On: 05/24/2024 23:46        Laneta Blunt, DO [facility] 05/26/2024, 3:49 PM    Dictation software may have been used to generate the above note. Typos may occur and escape review in typed/dictated notes. Please contact Dr Blunt directly for clarity if needed.  Staff may message me via secure chat in Epic  but this may not receive an immediate response,  please page me for urgent matters!  If 7PM-7AM, please contact night coverage [email or web address]

## 2024-05-26 NOTE — Hospital Course (Addendum)
 Hospital course / significant events:   HPI: [name] is a 78 y.o. Caucasian male with medical history significant for hypertension, dyslipidemia, OSA, RLS, asthma, atrial fibrillation COPD, and PAD, who presented to the emergency room with acute onset of generalized weakness as well as dyspnea with associated cough now productive of yellow sputum.    Recently discharged from hospital 05/15/24 for NSTEMI, Afib RVR.   08/15: to ED via EMS from home w/ weakness. Plan at this time was to go to SNF rehab which he declined on last admisison.  08/16-08/17: holding in ED.  08/18: Febrile, Afib RVR, leukocytosis. Hospitalist team contacted  08/19: admitted early AM to hospitalist service, Afib RVR resolved, treating for COPD exacerbation w/ SIRS / Sepsis pneumonia or other. Continued abx and held steroids d/t concern for sepsis.  08/20: low procal, pt symptoms more c/w viral illness (fatigue, muscle aches), probably not much utility in broad resp PCR at this point but will give steroids for hypoxia in COPD and hold abx and monitor - if this seems to improve into tomorrow may consider dc. Auth for [facility] is pending.  08/21: SpO2 100% RA at rest.     Consultants:  none  Procedures/Surgeries: none      ASSESSMENT & PLAN:   (+)SIRS likely d/t Afib/RVR, COPD Question sepsis due to undetermined organism  SIRS: WBC <4 or >12 - yes initial WBC 12.7 Temp <96.8 or >100.4 - yes TMax 100.7 HR >90 - yes but Afib confounding factor  RR >20 - yes  Sepsis / source infection? None identified, question pna Cannot confirm sepsis - pt was started on abx for presumed pneumonia but there is no evidence for pneumonia, likely COPD / question viral respiratory illness trigger. Treat COPD as below  SIRS likely d/t COPD, Afib --> treat underlying cause(s) as below     Paroxysmal atrial fibrillation with RVR - RVR resolved Rate improved continue Cardizem  CD. not on anticoagulation due to fall risk. Continue  ASA   COPD with acute exacerbation  Acute hypoxic respiratory failure d/t COPD - improved Continue nebulization tx (albuterol  prn), Incruse ellipta  Continue prn mucolytics/antitussives Steroid therapy was initially held in the setting of presumed sepsis but I am not convinced of bacterial pneumonia, initiated steroids yesterday Continue w/ azithromycin , can dc rocephin     Bipolar 1 disorder  Continue Depakote , Zyprexa , Tegretol     Dementia  continue Aricept .   Dyslipidemia Continue statin    GERD without esophagitis continue PPI     overweight based on BMI: Body mass index is 28.75 kg/m.SABRA Significantly low or high BMI is associated with higher medical risk.  Underweight - under 18  overweight - 25 to 29 obese - 30 or more Class 1 obesity: BMI of 30.0 to 34 Class 2 obesity: BMI of 35.0 to 39 Class 3 obesity: BMI of 40.0 to 49 Super Morbid Obesity: BMI 50-59 Super-super Morbid Obesity: BMI 60+ Healthy nutrition and physical activity advised as adjunct to other disease management and risk reduction treatments    DVT prophylaxis: lovenox  IV fluids: no continuous IV fluids  Nutrition: cardiac diet  Central lines / other devices: none  Code Status: DNR ACP documentation reviewed:  none on file in [facility]  TOC needs: SF placement  Medical barriers to dispo: O2 requirement. Expected medical readiness for discharge hopefully tomorrow / Friday.

## 2024-05-27 DIAGNOSIS — A419 Sepsis, unspecified organism: Secondary | ICD-10-CM | POA: Diagnosis not present

## 2024-05-27 DIAGNOSIS — F3162 Bipolar disorder, current episode mixed, moderate: Secondary | ICD-10-CM | POA: Diagnosis not present

## 2024-05-27 DIAGNOSIS — M6259 Muscle wasting and atrophy, not elsewhere classified, multiple sites: Secondary | ICD-10-CM | POA: Diagnosis not present

## 2024-05-27 DIAGNOSIS — Z7401 Bed confinement status: Secondary | ICD-10-CM | POA: Diagnosis not present

## 2024-05-27 DIAGNOSIS — A4189 Other specified sepsis: Secondary | ICD-10-CM | POA: Diagnosis not present

## 2024-05-27 DIAGNOSIS — F01B18 Vascular dementia, moderate, with other behavioral disturbance: Secondary | ICD-10-CM | POA: Diagnosis not present

## 2024-05-27 DIAGNOSIS — F319 Bipolar disorder, unspecified: Secondary | ICD-10-CM | POA: Diagnosis not present

## 2024-05-27 DIAGNOSIS — J189 Pneumonia, unspecified organism: Secondary | ICD-10-CM | POA: Diagnosis not present

## 2024-05-27 DIAGNOSIS — J449 Chronic obstructive pulmonary disease, unspecified: Secondary | ICD-10-CM | POA: Diagnosis not present

## 2024-05-27 MED ORDER — PREDNISONE 20 MG PO TABS: 40.0000 mg | ORAL_TABLET | Freq: Every day | ORAL | Status: AC

## 2024-05-27 MED ORDER — AZITHROMYCIN 250 MG PO TABS: 250.0000 mg | ORAL_TABLET | Freq: Every day | ORAL | Status: AC

## 2024-05-27 NOTE — Progress Notes (Signed)
 Physical Therapy Treatment Patient Details Name: TKAI SERFASS MRN: [id number] DOB: 12/07/1945 Today's Date: 05/27/2024   History of Present Illness Pt comes via EMs from home with c/o weakness. Pt states he was seen here last week for same and falls. Pt was admitted and d/c last Saturday. Pt has been getting more weak since at home. Pt had another fall on Wed. Pt denies any injuries or pain anywhere.    PT Comments  Pt continues to demonstrate difficulty with ambulation and activity tolerance. SPT attempted to ween pt off of supplemental O2. At rest, SpO2 stayed >90%. With attempt to ambulate within room, SpO2 decreased to 86% at lowest with HR increasing to 101bpm. Despite cues for breathing, pt unable to recover SpO2, and SPT re-applied nasal cannula and seated break at the EOB was taken. Pt left on 2L of O2. Pt attempt to ambulate around bed using RW to get into recliner and reported dizziness. Ambulation was discontinued and pt was able to take backwards steps back into bed. Pt requires mod verbal cuing throughout session for technique and sequencing. Would benefit from skilled PT to address above deficits and promote optimal return to PLOF.   If plan is discharge home, recommend the following: A little help with walking and/or transfers;A little help with bathing/dressing/bathroom;Assistance with cooking/housework;Assist for transportation;Help with stairs or ramp for entrance   Can travel by private vehicle     Yes  Equipment Recommendations  Rolling walker (2 wheels);BSC/3in1    Recommendations for Other Services       Precautions / Restrictions Precautions Precautions: Fall Recall of Precautions/Restrictions: Intact Restrictions Weight Bearing Restrictions Per Provider Order: No     Mobility  Bed Mobility Overal bed mobility: Needs Assistance Bed Mobility: Supine to Sit     Supine to sit: Supervision, HOB elevated     General bed mobility comments: Able to perform  bed mobility with supervision, however pt demonstrates mild difficulty to perform on it own and required increased time. Increased RR once seated on EOB.    Transfers Overall transfer level: Needs assistance Equipment used: Rolling walker (2 wheels) Transfers: Sit to/from Stand, Bed to chair/wheelchair/BSC Sit to Stand: Min assist   Step pivot transfers: Mod assist       General transfer comment: Mod verbal cuing for sequencing and hand placement onto arm rests before sitting.    Ambulation/Gait Ambulation/Gait assistance: Mod assist Gait Distance (Feet): 4 Feet Assistive device: Rolling walker (2 wheels) Gait Pattern/deviations: Decreased step length - right, Decreased step length - left, Step-through pattern       General Gait Details: Pt opted to attempt walking around bed to recliner without use of supplemental O2. During ambulation, pt reports dizziness and was instruced to sit down. Pt presents forward flexed posture despite verbal and tactile cuing to correct.   Stairs             Wheelchair Mobility     Tilt Bed    Modified Rankin (Stroke Patients Only)       Balance Overall balance assessment: Needs assistance, History of Falls Sitting-balance support: No upper extremity supported, Feet supported Sitting balance-[name]: Good Sitting balance - Comments: pt able to maintain sitting balance with supervision for safety.   Standing balance support: Bilateral upper extremity supported, During functional activity, Reliant on assistive device for balance Standing balance-[name]: Poor Standing balance comment: Pt presents forward flexed posture when standing despite verbal and tactile cuing to correct. No LOB while standing, however pt  reports dizziness while up.                            Communication Communication Communication: No apparent difficulties  Cognition Arousal: Alert Behavior During Therapy: WFL for tasks  assessed/performed   PT - Cognitive impairments: No apparent impairments                       PT - Cognition Comments: Pt is pleasant and agreeable to therapy. Following commands: Intact      Cueing Cueing Techniques: Verbal cues  Exercises Other Exercises Other Exercises: Lateral scoots at EOB.    General Comments General comments (skin integrity, edema, etc.): Attempt to ween pt off supplemental O2. Pt left on 2L of O2.      Pertinent Vitals/Pain Pain Assessment Pain Assessment: No/denies pain    Home Living                          Prior Function            PT Goals (current goals can now be found in the care plan section) Acute Rehab PT Goals Patient Stated Goal: non stated PT Goal Formulation: With patient Time For Goal Achievement: 06/05/24 Potential to Achieve Goals: Good Progress towards PT goals: Progressing toward goals    Frequency    Min 2X/week      PT Plan      Co-evaluation              AM-PAC PT 6 Clicks Mobility   Outcome Measure  Help needed turning from your back to your side while in a flat bed without using bedrails?: None Help needed moving from lying on your back to sitting on the side of a flat bed without using bedrails?: A Little Help needed moving to and from a bed to a chair (including a wheelchair)?: A Little Help needed standing up from a chair using your arms (e.g., wheelchair or bedside chair)?: A Little Help needed to walk in hospital room?: A Lot Help needed climbing 3-5 steps with a railing? : A Lot 6 Click Score: 17    End of Session Equipment Utilized During Treatment: Gait belt;Oxygen  Activity Tolerance: Patient tolerated treatment well Patient left: in chair;with call bell/phone within reach;with chair alarm set Nurse Communication: Mobility status PT Visit Diagnosis: Muscle weakness (generalized) (M62.81);Repeated falls (R29.6);Unsteadiness on feet (R26.81);Difficulty in walking, not  elsewhere classified (R26.2)     Time: 9089-9068 PT Time Calculation (min) (ACUTE ONLY): 21 min  Charges:                            Avaeh Ewer, SPT    Orlando Devereux 05/27/2024, 11:59 AM

## 2024-05-27 NOTE — TOC Progression Note (Signed)
 Transition of Care [facility]) - Progression Note    Patient Details  Name: Bradley Bowers MRN: [id number] Date of Birth: Mar 09, 1946  [facility]) CM/SW Contact  Dalia GORMAN Fuse, RN Phone Number: 05/27/2024, 12:29 PM  Clinical Narrative:    IV ABX on hold, receiving steroids, will likely discharge to [facility] tomorrow. TOC will continue to follow.                     Expected Discharge Plan and Services                                               Social Drivers of Health (SDOH) Interventions SDOH Screenings   Food Insecurity: No Food Insecurity (05/25/2024)  Housing: Low Risk  (05/25/2024)  Transportation Needs: No Transportation Needs (05/25/2024)  Utilities: Not At Risk (05/25/2024)  Alcohol Screen: Low Risk  (08/16/2017)  Depression (PHQ2-9): Low Risk  (04/05/2021)  Financial Resource Strain: Low Risk  (04/30/2023)   Received from [facility]  Social Connections: Socially Isolated (05/25/2024)  Tobacco Use: Medium Risk (05/21/2024)    Readmission Risk Interventions    05/14/2024   11:34 AM  Readmission Risk Prevention Plan  Transportation Screening Complete  PCP or Specialist Appt within 3-5 Days Complete  Social Work Consult for Recovery Care Planning/Counseling Complete  Palliative Care Screening Not Applicable  Medication Review Oceanographer) Complete

## 2024-05-27 NOTE — Progress Notes (Signed)
 Occupational Therapy Treatment Patient Details Name: Bradley Bowers MRN: [id number] DOB: May 04, 1946 Today's Date: 05/27/2024   History of present illness Pt comes via EMs from home with c/o weakness. Pt states he was seen here last week for same and falls. Pt was admitted and d/c last Saturday. Pt has been getting more weak since at home. Pt had another fall on Wed. Pt denies any injuries or pain anywhere.   OT comments  Pt is seated in recliner on arrival. Pleasant and agreeable to OT session. He denies pain. Pt performed STS from recliner to RW with Min A progressing to CGA, cues for hand placement and forward scooting to edge of recliner. He was able to stand with Min to CGA to maintain balance during standing oral care with mostly unilateral support on RW. Seated rest break taken, with sp02 stable on 2L. Weaned to 1L and pt demo LB dressing with Min A to don shorts and Min A for step pivot to bed using RW with cues for safety. He returned to supine with supervision. Sp02 dropped to 89% on 1L and only returned to 91% with increased time. HR up to 141 with transfer. Returned pt to 2L with sp02 reading of 96%. Pt demo ability to utilize urinal at bed level with MOD I. He continues to fatigue easily and remains with increased weakness. Pt returned to bed with all needs in place and will cont to require skilled acute OT services to maximize his safety and IND to return to PLOF.       If plan is discharge home, recommend the following:  A lot of help with bathing/dressing/bathroom;Assistance with cooking/housework;Help with stairs or ramp for entrance;Assist for transportation;A little help with walking and/or transfers   Equipment Recommendations  Other (comment) (defer)    Recommendations for Other Services      Precautions / Restrictions Precautions Precautions: Fall Recall of Precautions/Restrictions: Intact Restrictions Weight Bearing Restrictions Per Provider Order: No        Mobility Bed Mobility Overal bed mobility: Needs Assistance Bed Mobility: Sit to Supine       Sit to supine: Supervision   General bed mobility comments: able to return to supine with no physical assist    Transfers Overall transfer level: Needs assistance Equipment used: Rolling walker (2 wheels) Transfers: Sit to/from Stand, Bed to chair/wheelchair/BSC Sit to Stand: Min assist     Step pivot transfers: Contact guard assist, Min assist     General transfer comment: cues for hand/feet placement and proper positioning on recliner to stand at RW; Min/CGA for safety with step pivot from recliner back to bed     Balance Overall balance assessment: Needs assistance, History of Falls Sitting-balance support: No upper extremity supported, Feet supported Sitting balance-[name]: Good     Standing balance support: During functional activity, Single extremity supported Standing balance-[name]: Poor Standing balance comment: requires unilateral support on RW and CGA from therapist to maintain dynamic standing balance during functional activity                           ADL either performed or assessed with clinical judgement   ADL Overall ADL's : Needs assistance/impaired     Grooming: Wash/dry face;Oral care;Standing;Contact guard assist               Lower Body Dressing: Minimal assistance;Sit to/from stand;Sitting/lateral leans Lower Body Dressing Details (indicate cue type and reason): to don shorts Toilet  Transfer: Minimal assistance;Rolling walker (2 wheels) Toilet Transfer Details (indicate cue type and reason): simulated to bed from recliner Toileting- Clothing Manipulation and Hygiene: Supervision/safety;Bed level Toileting - Clothing Manipulation Details (indicate cue type and reason): utilize urinal            Extremity/Trunk Assessment              Vision       Perception     Praxis     Communication  Communication Communication: No apparent difficulties   Cognition Arousal: Alert Behavior During Therapy: WFL for tasks assessed/performed                                 Following commands: Intact        Cueing   Cueing Techniques: Verbal cues  Exercises      Shoulder Instructions       General Comments continues to require 02, dropped to 89% on 1L with transfer, left on 2L at 96%; HR up to 141 with SPT to bed    Pertinent Vitals/ Pain       Pain Assessment Pain Assessment: No/denies pain Pain Intervention(s): Monitored during session  Home Living                                          Prior Functioning/Environment              Frequency  Min 2X/week        Progress Toward Goals  OT Goals(current goals can now be found in the care plan section)  Progress towards OT goals: Progressing toward goals  Acute Rehab OT Goals Patient Stated Goal: get stronger OT Goal Formulation: With patient Time For Goal Achievement: 06/05/24 Potential to Achieve Goals: Good  Plan      Co-evaluation                 AM-PAC OT 6 Clicks Daily Activity     Outcome Measure   Help from another person eating meals?: None Help from another person taking care of personal grooming?: A Little Help from another person toileting, which includes using toliet, bedpan, or urinal?: A Lot Help from another person bathing (including washing, rinsing, drying)?: A Lot Help from another person to put on and taking off regular upper body clothing?: A Little Help from another person to put on and taking off regular lower body clothing?: A Little 6 Click Score: 17    End of Session Equipment Utilized During Treatment: Gait belt;Rolling walker (2 wheels)  OT Visit Diagnosis: Other abnormalities of gait and mobility (R26.89);Muscle weakness (generalized) (M62.81)   Activity Tolerance Patient tolerated treatment well   Patient Left in bed;with call  bell/phone within reach;with bed alarm set   Nurse Communication Mobility status        Time: 8469-8450 OT Time Calculation (min): 19 min  Charges: OT General Charges $OT Visit: 1 Visit OT Treatments $Self Care/Home Management : 8-22 mins  Nylee Barbuto, OTR/L  05/27/24, 4:13 PM  Kuzey Ogata E Lonna Rabold 05/27/2024, 4:10 PM

## 2024-05-27 NOTE — Progress Notes (Signed)
 Called report to [facility] [phone] Spoke with charge Nurse Leontine assuming care. Pts IV was removed and belongings placed in belonging bag.

## 2024-05-27 NOTE — Plan of Care (Signed)

## 2024-05-27 NOTE — TOC Transition Note (Signed)
 Transition of Care [facility]) - Discharge Note   Patient Details  Name: Bradley Bowers MRN: [id number] Date of Birth: August 31, 1946  Transition of Care [facility]) CM/SW Contact:  Dalia GORMAN Fuse, RN Phone Number: 05/27/2024, 3:33 PM   Clinical Narrative:    Patient is medically clear to discharge to [facility]. [facility] will transport. No other TOC needs.   Final next level of care: Skilled Nursing Facility Barriers to Discharge: Barriers Resolved   Patient Goals and CMS Choice            Discharge Placement              Patient chooses bed at: [facility] [place] Patient to be transferred to facility by: [facility]      Discharge Plan and Services Additional resources added to the After Visit Summary for                                       Social Drivers of Health (SDOH) Interventions SDOH Screenings   Food Insecurity: No Food Insecurity (05/25/2024)  Housing: Low Risk  (05/25/2024)  Transportation Needs: No Transportation Needs (05/25/2024)  Utilities: Not At Risk (05/25/2024)  Alcohol Screen: Low Risk  (08/16/2017)  Depression (PHQ2-9): Low Risk  (04/05/2021)  Financial Resource Strain: Low Risk  (04/30/2023)   Received from [facility]  Social Connections: Socially Isolated (05/25/2024)  Tobacco Use: Medium Risk (05/21/2024)     Readmission Risk Interventions    05/14/2024   11:34 AM  Readmission Risk Prevention Plan  Transportation Screening Complete  PCP or Specialist Appt within 3-5 Days Complete  Social Work Consult for Recovery Care Planning/Counseling Complete  Palliative Care Screening Not Applicable  Medication Review Oceanographer) Complete

## 2024-05-27 NOTE — Discharge Summary (Signed)
 Physician Discharge Summary   Patient: Bradley Bowers MRN: [id number]  DOB: 07/26/1946   Admit:     Date of Admission: 05/21/2024 Admitted from: home   Discharge: Date of discharge: 05/27/24 Disposition: Skilled nursing facility Condition at discharge: good  CODE STATUS: DNR     Discharge Physician: Laneta Blunt, DO [facility]     PCP: Cleotilde Oneil FALCON, MD  Recommendations for Outpatient Follow-up:  Follow up with PCP Cleotilde Oneil FALCON, MD in 1-2 weeks        Discharge Diagnoses: Principal Problem:   Sepsis due to undetermined organism [facility]) Active Problems:   Paroxysmal atrial fibrillation with RVR (HCC)   COPD with acute exacerbation (HCC)   Bipolar 1 disorder (HCC)   GERD without esophagitis   Dyslipidemia   Dementia [facility])       Hospital course / significant events:   HPI: Bradley Bowers is a 78 y.o. Caucasian male with medical history significant for hypertension, dyslipidemia, OSA, RLS, asthma, atrial fibrillation COPD, and PAD, who presented to the emergency room with acute onset of generalized weakness as well as dyspnea with associated cough now productive of yellow sputum.    Recently discharged from hospital 05/15/24 for NSTEMI, Afib RVR.   08/15: to ED via EMS from home w/ weakness. Plan at this time was to go to SNF rehab which he declined on last admisison.  08/16-08/17: holding in ED.  08/18: Febrile, Afib RVR, leukocytosis. Hospitalist team contacted  08/19: admitted early AM to hospitalist service, Afib RVR resolved, treating for COPD exacerbation w/ SIRS / Sepsis pneumonia or other. Continued abx and held steroids d/t concern for sepsis.  08/20: low procal, pt symptoms more c/w viral illness (fatigue, muscle aches), probably not much utility in broad resp PCR at this point but will give steroids for hypoxia in COPD and hold abx and monitor - if this seems to improve into tomorrow may consider dc. Auth for [facility] is pending.  08/21:  SpO2 100% RA at rest, remains on 2L w/ ambulation/prn, [place]bale for discharge     Consultants:  none  Procedures/Surgeries: none      ASSESSMENT & PLAN:   (+)SIRS likely d/t Afib/RVR, COPD Question sepsis due to undetermined organism  SIRS: WBC <4 or >12 - yes initial WBC 12.7 Temp <96.8 or >100.4 - yes TMax 100.7 HR >90 - yes but Afib confounding factor  RR >20 - yes  Sepsis / source infection? None identified, question pna Cannot confirm sepsis - pt was started on abx for presumed pneumonia but there is no evidence for pneumonia, likely COPD / question viral respiratory illness trigger. Treat COPD as below  SIRS likely d/t COPD, Afib --> treat underlying cause(s) as below     Paroxysmal atrial fibrillation with RVR - RVR resolved Rate improved continue Cardizem  . not on anticoagulation due to fall risk. Continue ASA   COPD with acute exacerbation  Acute hypoxic respiratory failure d/t COPD - improved Resume advair + spiriva  on discharge Albuterol  prn Continue prn mucolytics/antitussives Steroid therapy was initially held in the setting of presumed sepsis but I am not convinced of bacterial pneumonia, initiated steroids yesterday --> continue po to complete 5 day burst  Continue w/ azithromycin  po for 5 days total, have dc rocephin     Bipolar 1 disorder  Continue Depakote , Zyprexa , Tegretol     Dementia  continue Aricept .   Dyslipidemia Continue statin    GERD without esophagitis continue PPI     overweight  based on BMI: Body mass index is 28.75 kg/m.SABRA Significantly low or high BMI is associated with higher medical risk.  Underweight - under 18  overweight - 25 to 29 obese - 30 or more Class 1 obesity: BMI of 30.0 to 34 Class 2 obesity: BMI of 35.0 to 39 Class 3 obesity: BMI of 40.0 to 49 Super Morbid Obesity: BMI 50-59 Super-super Morbid Obesity: BMI 60+ Healthy nutrition and physical activity advised as adjunct to other disease management and risk  reduction treatments           Discharge Instructions  Allergies as of 05/27/2024   No Known Allergies      Medication List     STOP taking these medications    mometasone -formoterol  100-5 MCG/ACT Aero Commonly known as: DULERA        TAKE these medications    albuterol  108 (90 Base) MCG/ACT inhaler Commonly known as: VENTOLIN  HFA Inhale 1-2 puffs into the lungs every 4 (four) hours as needed.   aspirin  EC 81 MG tablet Take 81 mg by mouth daily.   atorvastatin  80 MG tablet Commonly known as: LIPITOR  Take 80 mg by mouth daily.   azithromycin  250 MG tablet Commonly known as: Zithromax  Take 1 tablet (250 mg total) by mouth daily for 3 days. Start taking on: May 28, 2024   benzonatate 200 MG capsule Commonly known as: TESSALON Take 200 mg by mouth 3 (three) times daily as needed for cough.   carbamazepine  200 MG tablet Commonly known as: TEGRETOL  Take 1 tablet by mouth 2 (two) times daily.   cholecalciferol  25 MCG (1000 UNIT) tablet Commonly known as: VITAMIN D3 Take 1,000 Units by mouth daily.   cyanocobalamin  1000 MCG tablet Commonly known as: VITAMIN B12 Take 1,000 mcg by mouth daily.   diltiazem  120 MG 24 hr capsule Commonly known as: CARDIZEM  CD Take 120 mg by mouth daily.   divalproex  250 MG 24 hr tablet Commonly known as: DEPAKOTE  ER Take 500 mg by mouth daily.   donepezil  10 MG tablet Commonly known as: ARICEPT  Take 1 tablet by mouth at bedtime.   feeding supplement Liqd Take 237 mLs by mouth 2 (two) times daily between meals.   fluticasone  50 MCG/ACT nasal spray Commonly known as: FLONASE  Place 1 spray into both nostrils daily as needed for allergies.   fluticasone -salmeterol 100-50 MCG/ACT Aepb Commonly known as: ADVAIR Inhale 1 puff into the lungs 2 (two) times daily.   guaiFENesin  600 MG 12 hr tablet Commonly known as: MUCINEX  Take 600 mg by mouth 2 (two) times daily as needed.   latanoprost  0.005 % ophthalmic  solution Commonly known as: XALATAN  Place 1 drop into both eyes at bedtime.   montelukast  10 MG tablet Commonly known as: SINGULAIR  Take 10 mg by mouth at bedtime.   OLANZapine  5 MG tablet Commonly known as: ZYPREXA  Take 5 mg by mouth at bedtime.   pantoprazole  40 MG tablet Commonly known as: PROTONIX  Take 40 mg by mouth 2 (two) times daily.   predniSONE  20 MG tablet Commonly known as: DELTASONE  Take 2 tablets (40 mg total) by mouth daily with breakfast for 3 days. For 5 days Start taking on: May 28, 2024   timolol  0.5 % ophthalmic solution Commonly known as: TIMOPTIC  1 drop daily.   tiotropium 18 MCG inhalation capsule Commonly known as: SPIRIVA  Place 18 mcg into inhaler and inhale daily.   Travatan Z 0.004 % Soln ophthalmic solution Generic drug: Travoprost (BAK Free) Place 1 drop into both eyes  at bedtime.          No Known Allergies   Subjective: pt feeling better today, still tired/fatigued and SOB on exertion but better compared to yesterday   Discharge Exam: BP 138/65 (BP Location: Left Arm)   Pulse 98   Temp 97.9 F (36.6 C)   Resp 19   Ht 5' 6 (1.676 m)   Wt 80.8 kg   SpO2 97%   BMI 28.75 kg/m  General: Pt is alert, awake, not in acute distress Cardiovascular: RRR, S1/S2 +, no rubs, no gallops Respiratory: diminished breath sounds all fields, wheezing faint/scattered much better than yesterday.  Abdominal: Soft, NT, ND, bowel sounds + Extremities: no edema, no cyanosis     The results of significant diagnostics from this hospitalization (including imaging, microbiology, ancillary and laboratory) are listed below for reference.     Microbiology: Recent Results (from the past 240 hours)  Resp panel by RT-PCR (RSV, Flu A&B, Covid) Anterior Nasal Swab     Status: None   Collection Time: 05/24/24 11:44 PM   Specimen: Anterior Nasal Swab  Result Value Ref Range Status   SARS Coronavirus 2 by RT PCR NEGATIVE NEGATIVE Final    Comment:  (NOTE) SARS-CoV-2 target nucleic acids are NOT DETECTED.  The SARS-CoV-2 RNA is generally detectable in upper respiratory specimens during the acute phase of infection. The lowest concentration of SARS-CoV-2 viral copies this assay can detect is 138 copies/mL. A negative result does not preclude SARS-Cov-2 infection and should not be used as the sole basis for treatment or other patient management decisions. A negative result may occur with  improper specimen collection/handling, submission of specimen other than nasopharyngeal swab, presence of viral mutation(s) within the areas targeted by this assay, and inadequate number of viral copies(<138 copies/mL). A negative result must be combined with clinical observations, patient history, and epidemiological information. The expected result is Negative.  Fact Sheet for Patients:  BloggerCourse.com  Fact Sheet for Healthcare Providers:  SeriousBroker.it  This test is no t yet approved or cleared by the [place]  FDA and  has been authorized for detection and/or diagnosis of SARS-CoV-2 by FDA under an Emergency Use Authorization (EUA). This EUA will remain  in effect (meaning this test can be used) for the duration of the COVID-19 declaration under Section 564(b)(1) of the Act, 21 U.S.C.section 360bbb-3(b)(1), unless the authorization is terminated  or revoked sooner.       Influenza A by PCR NEGATIVE NEGATIVE Final   Influenza B by PCR NEGATIVE NEGATIVE Final    Comment: (NOTE) The Xpert Xpress SARS-CoV-2/FLU/RSV plus assay is intended as an aid in the diagnosis of influenza from Nasopharyngeal swab specimens and should not be used as a sole basis for treatment. Nasal washings and aspirates are unacceptable for Xpert Xpress SARS-CoV-2/FLU/RSV testing.  Fact Sheet for Patients: BloggerCourse.com  Fact Sheet for Healthcare  Providers: SeriousBroker.it  This test is not yet approved or cleared by the [place]  FDA and has been authorized for detection and/or diagnosis of SARS-CoV-2 by FDA under an Emergency Use Authorization (EUA). This EUA will remain in effect (meaning this test can be used) for the duration of the COVID-19 declaration under Section 564(b)(1) of the Act, 21 U.S.C. section 360bbb-3(b)(1), unless the authorization is terminated or revoked.     Resp Syncytial Virus by PCR NEGATIVE NEGATIVE Final    Comment: (NOTE) Fact Sheet for Patients: BloggerCourse.com  Fact Sheet for Healthcare Providers: SeriousBroker.it  This test is not yet approved  or cleared by the [place]  FDA and has been authorized for detection and/or diagnosis of SARS-CoV-2 by FDA under an Emergency Use Authorization (EUA). This EUA will remain in effect (meaning this test can be used) for the duration of the COVID-19 declaration under Section 564(b)(1) of the Act, 21 U.S.C. section 360bbb-3(b)(1), unless the authorization is terminated or revoked.  Performed at [facility], [address] [place], [place] [address]   Blood culture (routine x 2)     Status: None (Preliminary result)   Collection Time: 05/25/24 12:08 AM   Specimen: BLOOD  Result Value Ref Range Status   Specimen Description BLOOD ASSIST CONTROL  Final   Special Requests   Final    BOTTLES DRAWN AEROBIC AND ANAEROBIC Blood Culture results may not be optimal due to an excessive volume of blood received in culture bottles   Culture   Final    NO GROWTH 2 DAYS Performed at [facility], [address] [place], [place] [address]    Report Status PENDING  Incomplete  Blood culture (routine x 2)     Status: None (Preliminary result)   Collection Time: 05/25/24 12:30 AM   Specimen: BLOOD  Result Value Ref Range Status   Specimen Description BLOOD  BLOOD RIGHT HAND  Final   Special Requests   Final    BOTTLES DRAWN AEROBIC AND ANAEROBIC Blood Culture adequate volume   Culture   Final    NO GROWTH 2 DAYS Performed at [facility], [address] [place], [place] [address]    Report Status PENDING  Incomplete     Labs: BNP (last 3 results) Recent Labs    05/21/24 1216  BNP 408.6*   Basic Metabolic Panel: Recent Labs  Lab 05/21/24 1216 05/25/24 0030 05/25/24 0514 05/26/24 0515  NA 136 132* 136 141  K 4.5 3.7 3.6 4.3  CL 93* 93* 97* 101  CO2 30 30 31  32  GLUCOSE 109* 126* 97 102*  BUN 24* 25* 24* 24*  CREATININE 1.06 1.07 0.99 0.88  CALCIUM  8.8* 8.3* 8.0* 8.7*  MG 1.7  --   --   --    Liver Function Tests: Recent Labs  Lab 05/21/24 1216 05/25/24 0030  AST 59* 37  ALT 56* 31  ALKPHOS 73 77  BILITOT 1.4* 1.0  PROT 7.1 6.8  ALBUMIN 3.0* 2.7*   No results for input(s): LIPASE, AMYLASE in the last 168 hours. No results for input(s): AMMONIA in the last 168 hours. CBC: Recent Labs  Lab 05/21/24 1216 05/25/24 0030 05/25/24 0514 05/26/24 0515  WBC 16.4* 12.7* 9.3 8.3  NEUTROABS  --  10.9*  --  6.3  HGB 11.8* 10.5* 9.2* 10.2*  HCT 34.2* 31.1* 27.3* 30.6*  MCV 87.9 89.1 90.4 92.4  PLT 182 183 146* 187   Cardiac Enzymes: No results for input(s): CKTOTAL, CKMB, CKMBINDEX, TROPONINI in the last 168 hours. BNP: Invalid input(s): POCBNP CBG: Recent Labs  Lab 05/25/24 1141  GLUCAP 145*   D-Dimer No results for input(s): DDIMER in the last 72 hours. Hgb A1c No results for input(s): HGBA1C in the last 72 hours. Lipid Profile No results for input(s): CHOL, HDL, LDLCALC, TRIG, CHOLHDL, LDLDIRECT in the last 72 hours. Thyroid  function studies No results for input(s): TSH, T4TOTAL, T3FREE, THYROIDAB in the last 72 hours.  Invalid input(s): FREET3 Anemia work up No results for input(s): VITAMINB12, FOLATE, FERRITIN, TIBC, IRON, RETICCTPCT in  the last 72 hours. Urinalysis  Component Value Date/Time   COLORURINE YELLOW (A) 05/25/2024 0137   APPEARANCEUR HAZY (A) 05/25/2024 0137   APPEARANCEUR Clear 02/20/2016 1102   LABSPEC 1.023 05/25/2024 0137   PHURINE 6.0 05/25/2024 0137   GLUCOSEU NEGATIVE 05/25/2024 0137   HGBUR SMALL (A) 05/25/2024 0137   BILIRUBINUR NEGATIVE 05/25/2024 0137   BILIRUBINUR Negative 02/20/2016 1102   KETONESUR NEGATIVE 05/25/2024 0137   PROTEINUR 100 (A) 05/25/2024 0137   NITRITE NEGATIVE 05/25/2024 0137   LEUKOCYTESUR NEGATIVE 05/25/2024 0137   Sepsis Labs Recent Labs  Lab 05/21/24 1216 05/25/24 0030 05/25/24 0514 05/26/24 0515  WBC 16.4* 12.7* 9.3 8.3   Microbiology Recent Results (from the past 240 hours)  Resp panel by RT-PCR (RSV, Flu A&B, Covid) Anterior Nasal Swab     Status: None   Collection Time: 05/24/24 11:44 PM   Specimen: Anterior Nasal Swab  Result Value Ref Range Status   SARS Coronavirus 2 by RT PCR NEGATIVE NEGATIVE Final    Comment: (NOTE) SARS-CoV-2 target nucleic acids are NOT DETECTED.  The SARS-CoV-2 RNA is generally detectable in upper respiratory specimens during the acute phase of infection. The lowest concentration of SARS-CoV-2 viral copies this assay can detect is 138 copies/mL. A negative result does not preclude SARS-Cov-2 infection and should not be used as the sole basis for treatment or other patient management decisions. A negative result may occur with  improper specimen collection/handling, submission of specimen other than nasopharyngeal swab, presence of viral mutation(s) within the areas targeted by this assay, and inadequate number of viral copies(<138 copies/mL). A negative result must be combined with clinical observations, patient history, and epidemiological information. The expected result is Negative.  Fact Sheet for Patients:  BloggerCourse.com  Fact Sheet for Healthcare Providers:   SeriousBroker.it  This test is no t yet approved or cleared by the [place]  FDA and  has been authorized for detection and/or diagnosis of SARS-CoV-2 by FDA under an Emergency Use Authorization (EUA). This EUA will remain  in effect (meaning this test can be used) for the duration of the COVID-19 declaration under Section 564(b)(1) of the Act, 21 U.S.C.section 360bbb-3(b)(1), unless the authorization is terminated  or revoked sooner.       Influenza A by PCR NEGATIVE NEGATIVE Final   Influenza B by PCR NEGATIVE NEGATIVE Final    Comment: (NOTE) The Xpert Xpress SARS-CoV-2/FLU/RSV plus assay is intended as an aid in the diagnosis of influenza from Nasopharyngeal swab specimens and should not be used as a sole basis for treatment. Nasal washings and aspirates are unacceptable for Xpert Xpress SARS-CoV-2/FLU/RSV testing.  Fact Sheet for Patients: BloggerCourse.com  Fact Sheet for Healthcare Providers: SeriousBroker.it  This test is not yet approved or cleared by the [place]  FDA and has been authorized for detection and/or diagnosis of SARS-CoV-2 by FDA under an Emergency Use Authorization (EUA). This EUA will remain in effect (meaning this test can be used) for the duration of the COVID-19 declaration under Section 564(b)(1) of the Act, 21 U.S.C. section 360bbb-3(b)(1), unless the authorization is terminated or revoked.     Resp Syncytial Virus by PCR NEGATIVE NEGATIVE Final    Comment: (NOTE) Fact Sheet for Patients: BloggerCourse.com  Fact Sheet for Healthcare Providers: SeriousBroker.it  This test is not yet approved or cleared by the [place]  FDA and has been authorized for detection and/or diagnosis of SARS-CoV-2 by FDA under an Emergency Use Authorization (EUA). This EUA will remain in effect (meaning this test can be used) for  the  duration of the COVID-19 declaration under Section 564(b)(1) of the Act, 21 U.S.C. section 360bbb-3(b)(1), unless the authorization is terminated or revoked.  Performed at [facility], [address] [place], [place] [address]   Blood culture (routine x 2)     Status: None (Preliminary result)   Collection Time: 05/25/24 12:08 AM   Specimen: BLOOD  Result Value Ref Range Status   Specimen Description BLOOD ASSIST CONTROL  Final   Special Requests   Final    BOTTLES DRAWN AEROBIC AND ANAEROBIC Blood Culture results may not be optimal due to an excessive volume of blood received in culture bottles   Culture   Final    NO GROWTH 2 DAYS Performed at [facility], [address], [place], [place] [address]    Report Status PENDING  Incomplete  Blood culture (routine x 2)     Status: None (Preliminary result)   Collection Time: 05/25/24 12:30 AM   Specimen: BLOOD  Result Value Ref Range Status   Specimen Description BLOOD BLOOD RIGHT HAND  Final   Special Requests   Final    BOTTLES DRAWN AEROBIC AND ANAEROBIC Blood Culture adequate volume   Culture   Final    NO GROWTH 2 DAYS Performed at [facility], [address] [place], [place] [address]    Report Status PENDING  Incomplete   Imaging CT Maxillofacial W Contrast Result Date: 05/21/2024 CLINICAL DATA:  FAll, bruising EXAM: CT MAXILLOFACIAL WITH CONTRAST TECHNIQUE: Multidetector CT imaging of the maxillofacial structures was performed with intravenous contrast. Multiplanar CT image reconstructions were also generated. RADIATION DOSE REDUCTION: This exam was performed according to the departmental dose-optimization program which includes automated exposure control, adjustment of the mA and/or kV according to patient size and/or use of iterative reconstruction technique. CONTRAST:  75mL OMNIPAQUE  IOHEXOL  350 MG/ML SOLN COMPARISON:  None Available. FINDINGS: Osseous: Bilateral minimally displaced  nasal bone fractures. Periapical lucencies along the bilateral mandibular teeth. Orbits: Negative. No traumatic or inflammatory finding. Sinuses: Clear. Soft tissues: Negative. Limited intracranial: No significant or unexpected finding. Other: Degenerative changes of the visualized cervical spine. IMPRESSION: Bilateral minimally displaced nasal bone fractures. Electronically Signed   By: Morgane  Naveau M.D.   On: 05/21/2024 18:47   CT Head Wo Contrast Result Date: 05/21/2024 CLINICAL DATA:  Fall EXAM: CT HEAD WITHOUT CONTRAST TECHNIQUE: Contiguous axial images were obtained from the base of the skull through the vertex without intravenous contrast. RADIATION DOSE REDUCTION: This exam was performed according to the departmental dose-optimization program which includes automated exposure control, adjustment of the mA and/or kV according to patient size and/or use of iterative reconstruction technique. COMPARISON:  CT head 05/13/2024 FINDINGS: Brain: Cerebral ventricle sizes are concordant with the degree of cerebral volume loss. Patchy and confluent areas of decreased attenuation are noted throughout the deep and periventricular white matter of the cerebral hemispheres bilaterally, compatible with chronic microvascular ischemic disease. No evidence of large-territorial acute infarction. No parenchymal hemorrhage. No mass lesion. No extra-axial collection. No mass effect or midline shift. No hydrocephalus. Basilar cisterns are patent. Vascular: No hyperdense vessel. Atherosclerotic calcifications are present within the cavernous internal carotid arteries. Skull: No acute fracture or focal lesion. Other: None. IMPRESSION: 1.  No acute intracranial abnormality. 2. Please see separately dictated CT max face 05/21/2024 Electronically Signed   By: Morgane  Naveau M.D.   On: 05/21/2024 18:38   CT Angio Chest PE W and/or Wo Contrast Result Date: 05/21/2024 CLINICAL DATA:  Dating and appy  EXAM: CT ANGIOGRAPHY CHEST WITH  CONTRAST TECHNIQUE: Multidetector CT imaging of the chest was performed using the standard protocol during bolus administration of intravenous contrast. Multiplanar CT image reconstructions and MIPs were obtained to evaluate the vascular anatomy. RADIATION DOSE REDUCTION: This exam was performed according to the departmental dose-optimization program which includes automated exposure control, adjustment of the mA and/or kV according to patient size and/or use of iterative reconstruction technique. CONTRAST:  75mL OMNIPAQUE  IOHEXOL  350 MG/ML SOLN COMPARISON:  None Available. FINDINGS: Cardiovascular: No filling defects within the pulmonary arteries to suggest acute pulmonary embolism. Mediastinum/Nodes: No axillary or supraclavicular adenopathy. No mediastinal or hilar adenopathy. No pericardial fluid. Esophagus normal. Lungs/Pleura: No suspicious pulmonary nodules. Upper Abdomen: Limited view of the liver, kidneys, pancreas are unremarkable. Normal adrenal glands. Gallstone noted Musculoskeletal: No aggressive osseous lesion. Review of the MIP images confirms the above findings. IMPRESSION: 1. No evidence acute pulmonary embolism. 2. No acute pulmonary parenchymal findings. 3. Cholelithiasis. Electronically Signed   By: Jackquline Boxer M.D.   On: 05/21/2024 18:25      Time coordinating discharge: over 30 minutes  SIGNED:  Kaaren Nass DO [facility]

## 2024-05-28 DIAGNOSIS — M6259 Muscle wasting and atrophy, not elsewhere classified, multiple sites: Secondary | ICD-10-CM | POA: Diagnosis not present

## 2024-05-28 DIAGNOSIS — F319 Bipolar disorder, unspecified: Secondary | ICD-10-CM | POA: Diagnosis not present

## 2024-05-30 LAB — CULTURE, BLOOD (ROUTINE X 2)
Culture: NO GROWTH
Culture: NO GROWTH
Special Requests: ADEQUATE

## 2024-06-01 DIAGNOSIS — J9809 Other diseases of bronchus, not elsewhere classified: Secondary | ICD-10-CM | POA: Diagnosis not present

## 2024-06-01 DIAGNOSIS — J441 Chronic obstructive pulmonary disease with (acute) exacerbation: Secondary | ICD-10-CM | POA: Diagnosis not present

## 2024-06-01 DIAGNOSIS — D72829 Elevated white blood cell count, unspecified: Secondary | ICD-10-CM | POA: Diagnosis not present

## 2024-06-02 DIAGNOSIS — J441 Chronic obstructive pulmonary disease with (acute) exacerbation: Secondary | ICD-10-CM | POA: Diagnosis not present

## 2024-06-02 DIAGNOSIS — J189 Pneumonia, unspecified organism: Secondary | ICD-10-CM | POA: Diagnosis not present

## 2024-06-03 DIAGNOSIS — I10 Essential (primary) hypertension: Secondary | ICD-10-CM | POA: Diagnosis not present

## 2024-06-03 DIAGNOSIS — F319 Bipolar disorder, unspecified: Secondary | ICD-10-CM | POA: Diagnosis not present

## 2024-06-03 DIAGNOSIS — J189 Pneumonia, unspecified organism: Secondary | ICD-10-CM | POA: Diagnosis not present

## 2024-06-03 DIAGNOSIS — J441 Chronic obstructive pulmonary disease with (acute) exacerbation: Secondary | ICD-10-CM | POA: Diagnosis not present

## 2024-06-08 DIAGNOSIS — I2510 Atherosclerotic heart disease of native coronary artery without angina pectoris: Secondary | ICD-10-CM | POA: Diagnosis not present

## 2024-06-08 DIAGNOSIS — J449 Chronic obstructive pulmonary disease, unspecified: Secondary | ICD-10-CM | POA: Diagnosis not present

## 2024-06-08 DIAGNOSIS — I480 Paroxysmal atrial fibrillation: Secondary | ICD-10-CM | POA: Diagnosis not present

## 2024-06-08 DIAGNOSIS — E559 Vitamin D deficiency, unspecified: Secondary | ICD-10-CM | POA: Diagnosis not present

## 2024-06-08 DIAGNOSIS — R5381 Other malaise: Secondary | ICD-10-CM | POA: Diagnosis not present

## 2024-06-08 DIAGNOSIS — K219 Gastro-esophageal reflux disease without esophagitis: Secondary | ICD-10-CM | POA: Diagnosis not present

## 2024-06-08 DIAGNOSIS — I10 Essential (primary) hypertension: Secondary | ICD-10-CM | POA: Diagnosis not present

## 2024-06-08 DIAGNOSIS — H409 Unspecified glaucoma: Secondary | ICD-10-CM | POA: Diagnosis not present

## 2024-06-08 DIAGNOSIS — E785 Hyperlipidemia, unspecified: Secondary | ICD-10-CM | POA: Diagnosis not present

## 2024-06-11 DIAGNOSIS — I480 Paroxysmal atrial fibrillation: Secondary | ICD-10-CM | POA: Diagnosis not present

## 2024-06-11 DIAGNOSIS — F319 Bipolar disorder, unspecified: Secondary | ICD-10-CM | POA: Diagnosis not present

## 2024-06-11 DIAGNOSIS — I10 Essential (primary) hypertension: Secondary | ICD-10-CM | POA: Diagnosis not present

## 2024-06-11 DIAGNOSIS — F03B18 Unspecified dementia, moderate, with other behavioral disturbance: Secondary | ICD-10-CM | POA: Diagnosis not present

## 2024-06-11 DIAGNOSIS — G459 Transient cerebral ischemic attack, unspecified: Secondary | ICD-10-CM | POA: Diagnosis not present

## 2024-06-11 DIAGNOSIS — Z993 Dependence on wheelchair: Secondary | ICD-10-CM | POA: Diagnosis not present

## 2024-06-11 DIAGNOSIS — I739 Peripheral vascular disease, unspecified: Secondary | ICD-10-CM | POA: Diagnosis not present

## 2024-06-11 DIAGNOSIS — J441 Chronic obstructive pulmonary disease with (acute) exacerbation: Secondary | ICD-10-CM | POA: Diagnosis not present

## 2024-06-15 DIAGNOSIS — E559 Vitamin D deficiency, unspecified: Secondary | ICD-10-CM | POA: Diagnosis not present

## 2024-06-15 DIAGNOSIS — K219 Gastro-esophageal reflux disease without esophagitis: Secondary | ICD-10-CM | POA: Diagnosis not present

## 2024-06-15 DIAGNOSIS — H409 Unspecified glaucoma: Secondary | ICD-10-CM | POA: Diagnosis not present

## 2024-06-15 DIAGNOSIS — J449 Chronic obstructive pulmonary disease, unspecified: Secondary | ICD-10-CM | POA: Diagnosis not present

## 2024-06-15 DIAGNOSIS — I10 Essential (primary) hypertension: Secondary | ICD-10-CM | POA: Diagnosis not present

## 2024-06-15 DIAGNOSIS — I2510 Atherosclerotic heart disease of native coronary artery without angina pectoris: Secondary | ICD-10-CM | POA: Diagnosis not present

## 2024-06-15 DIAGNOSIS — F319 Bipolar disorder, unspecified: Secondary | ICD-10-CM | POA: Diagnosis not present

## 2024-06-15 DIAGNOSIS — E785 Hyperlipidemia, unspecified: Secondary | ICD-10-CM | POA: Diagnosis not present

## 2024-06-15 DIAGNOSIS — I480 Paroxysmal atrial fibrillation: Secondary | ICD-10-CM | POA: Diagnosis not present

## 2024-06-16 DIAGNOSIS — Z993 Dependence on wheelchair: Secondary | ICD-10-CM | POA: Diagnosis not present

## 2024-06-16 DIAGNOSIS — F319 Bipolar disorder, unspecified: Secondary | ICD-10-CM | POA: Diagnosis not present

## 2024-06-16 DIAGNOSIS — G459 Transient cerebral ischemic attack, unspecified: Secondary | ICD-10-CM | POA: Diagnosis not present

## 2024-06-16 DIAGNOSIS — F03B18 Unspecified dementia, moderate, with other behavioral disturbance: Secondary | ICD-10-CM | POA: Diagnosis not present

## 2024-06-16 DIAGNOSIS — A419 Sepsis, unspecified organism: Secondary | ICD-10-CM | POA: Diagnosis not present

## 2024-06-16 DIAGNOSIS — J441 Chronic obstructive pulmonary disease with (acute) exacerbation: Secondary | ICD-10-CM | POA: Diagnosis not present

## 2024-06-16 DIAGNOSIS — I739 Peripheral vascular disease, unspecified: Secondary | ICD-10-CM | POA: Diagnosis not present

## 2024-06-16 DIAGNOSIS — I10 Essential (primary) hypertension: Secondary | ICD-10-CM | POA: Diagnosis not present

## 2024-06-16 DIAGNOSIS — I480 Paroxysmal atrial fibrillation: Secondary | ICD-10-CM | POA: Diagnosis not present

## 2024-06-17 ENCOUNTER — Emergency Department

## 2024-06-17 ENCOUNTER — Emergency Department
Admission: EM | Admit: 2024-06-17 | Discharge: 2024-06-18 | Disposition: A | Discharge status: Home / Self Care | Attending: Emergency Medicine | Admitting: Emergency Medicine

## 2024-06-17 ENCOUNTER — Other Ambulatory Visit: Payer: Self-pay

## 2024-06-17 DIAGNOSIS — I739 Peripheral vascular disease, unspecified: Secondary | ICD-10-CM | POA: Diagnosis not present

## 2024-06-17 DIAGNOSIS — S0081XA Abrasion of other part of head, initial encounter: Secondary | ICD-10-CM | POA: Diagnosis not present

## 2024-06-17 DIAGNOSIS — S0992XA Unspecified injury of nose, initial encounter: Secondary | ICD-10-CM | POA: Diagnosis present

## 2024-06-17 DIAGNOSIS — S0031XA Abrasion of nose, initial encounter: Secondary | ICD-10-CM

## 2024-06-17 DIAGNOSIS — I2510 Atherosclerotic heart disease of native coronary artery without angina pectoris: Secondary | ICD-10-CM | POA: Insufficient documentation

## 2024-06-17 DIAGNOSIS — R600 Localized edema: Secondary | ICD-10-CM | POA: Insufficient documentation

## 2024-06-17 DIAGNOSIS — Y92129 Unspecified place in nursing home as the place of occurrence of the external cause: Secondary | ICD-10-CM | POA: Insufficient documentation

## 2024-06-17 DIAGNOSIS — R58 Hemorrhage, not elsewhere classified: Secondary | ICD-10-CM | POA: Diagnosis not present

## 2024-06-17 DIAGNOSIS — I10 Essential (primary) hypertension: Secondary | ICD-10-CM | POA: Diagnosis not present

## 2024-06-17 DIAGNOSIS — W19XXXA Unspecified fall, initial encounter: Secondary | ICD-10-CM | POA: Diagnosis not present

## 2024-06-17 DIAGNOSIS — R220 Localized swelling, mass and lump, head: Secondary | ICD-10-CM | POA: Diagnosis not present

## 2024-06-17 DIAGNOSIS — J449 Chronic obstructive pulmonary disease, unspecified: Secondary | ICD-10-CM | POA: Diagnosis not present

## 2024-06-17 DIAGNOSIS — S0990XA Unspecified injury of head, initial encounter: Secondary | ICD-10-CM | POA: Insufficient documentation

## 2024-06-17 DIAGNOSIS — G459 Transient cerebral ischemic attack, unspecified: Secondary | ICD-10-CM | POA: Diagnosis not present

## 2024-06-17 DIAGNOSIS — F03B18 Unspecified dementia, moderate, with other behavioral disturbance: Secondary | ICD-10-CM | POA: Diagnosis not present

## 2024-06-17 DIAGNOSIS — Z993 Dependence on wheelchair: Secondary | ICD-10-CM | POA: Diagnosis not present

## 2024-06-17 DIAGNOSIS — I480 Paroxysmal atrial fibrillation: Secondary | ICD-10-CM | POA: Diagnosis not present

## 2024-06-17 DIAGNOSIS — F319 Bipolar disorder, unspecified: Secondary | ICD-10-CM | POA: Diagnosis not present

## 2024-06-17 DIAGNOSIS — S022XXA Fracture of nasal bones, initial encounter for closed fracture: Secondary | ICD-10-CM | POA: Diagnosis not present

## 2024-06-17 DIAGNOSIS — S0993XA Unspecified injury of face, initial encounter: Secondary | ICD-10-CM | POA: Diagnosis not present

## 2024-06-17 DIAGNOSIS — A419 Sepsis, unspecified organism: Secondary | ICD-10-CM | POA: Diagnosis not present

## 2024-06-17 DIAGNOSIS — J441 Chronic obstructive pulmonary disease with (acute) exacerbation: Secondary | ICD-10-CM | POA: Diagnosis not present

## 2024-06-17 LAB — CBC WITH DIFFERENTIAL/PLATELET
Abs Immature Granulocytes: 0.02 K/uL (ref 0.00–0.07)
Basophils Absolute: 0 K/uL (ref 0.0–0.1)
Basophils Relative: 0 %
Eosinophils Absolute: 0.5 K/uL (ref 0.0–0.5)
Eosinophils Relative: 7 %
HCT: 35.8 % — ABNORMAL LOW (ref 39.0–52.0)
Hemoglobin: 11.6 g/dL — ABNORMAL LOW (ref 13.0–17.0)
Immature Granulocytes: 0 %
Lymphocytes Relative: 25 %
Lymphs Abs: 1.7 K/uL (ref 0.7–4.0)
MCH: 31.1 pg (ref 26.0–34.0)
MCHC: 32.4 g/dL (ref 30.0–36.0)
MCV: 96 fL (ref 80.0–100.0)
Monocytes Absolute: 0.6 K/uL (ref 0.1–1.0)
Monocytes Relative: 8 %
Neutro Abs: 4.2 K/uL (ref 1.7–7.7)
Neutrophils Relative %: 60 %
Platelets: 146 K/uL — ABNORMAL LOW (ref 150–400)
RBC: 3.73 MIL/uL — ABNORMAL LOW (ref 4.22–5.81)
RDW: 14.4 % (ref 11.5–15.5)
WBC: 7 K/uL (ref 4.0–10.5)
nRBC: 0 % (ref 0.0–0.2)

## 2024-06-17 LAB — BASIC METABOLIC PANEL WITH GFR
Anion gap: 11 (ref 5–15)
BUN: 17 mg/dL (ref 8–23)
CO2: 35 mmol/L — ABNORMAL HIGH (ref 22–32)
Calcium: 9 mg/dL (ref 8.9–10.3)
Chloride: 92 mmol/L — ABNORMAL LOW (ref 98–111)
Creatinine, Ser: 1.15 mg/dL (ref 0.61–1.24)
GFR, Estimated: >60 mL/min (ref >60)
Glucose, Bld: 134 mg/dL — ABNORMAL HIGH (ref 70–99)
Potassium: 4.3 mmol/L (ref 3.5–5.1)
Sodium: 138 mmol/L (ref 135–145)

## 2024-06-17 LAB — BRAIN NATRIURETIC PEPTIDE: B Natriuretic Peptide: 137.6 pg/mL — ABNORMAL HIGH (ref 0.0–100.0)

## 2024-06-17 NOTE — ED Triage Notes (Addendum)
 Pt to ED via EMS from home, pt fell today while trying to get dressed, pt has abrasion to nose and c/o pain to same, Pt states he began taking plavix  today, was off of it for awhile but just restarted it. Pt A&Ox4. Pt noted to have bilateral lower leg swelling, pt repots this is new over the past 3 days. Denies SOB or chest pain or neck pain.

## 2024-06-17 NOTE — ED Provider Notes (Signed)
 [facility] Provider Note    Event Date/Time   First MD Initiated Contact with Patient 06/17/24 2313     (approximate)   History   Fall and Leg Swelling   HPI  Bradley Bowers is a 78 y.o. male   Past medical history of COPD on chronic O2, CAD, hypertension hyperlipidemia, here with a fall.  He was mechanical slip and fall as he was trying to put on his underwear and fell forward as his hand support slipped out from underneath him.  He fell forward striking his nose.  He injured his face.  He reports no other significant injuries.  Chronic unchanged bilateral lower extremity edema without shortness of breath.   External Medical Documents Reviewed: Discharge summary from hospitalization in August      Physical Exam   Triage Vital Signs: ED Triage Vitals  Encounter Vitals Group     BP 06/17/24 1911 136/70     Girls Systolic BP Percentile --      Girls Diastolic BP Percentile --      Boys Systolic BP Percentile --      Boys Diastolic BP Percentile --      Pulse Rate 06/17/24 1911 100     Resp 06/17/24 1911 19     Temp 06/17/24 1911 98 F (36.7 C)     Temp src --      SpO2 06/17/24 1911 92 %     Weight 06/17/24 1910 177 lb (80.3 kg)     Height 06/17/24 1910 5' 6 (1.676 m)     Head Circumference --      Peak Flow --      Pain Score 06/17/24 1910 3     Pain Loc --      Pain Education --      Exclude from Growth Chart --     Most recent vital signs: Vitals:   06/17/24 2332 06/18/24 0043  BP:  (!) 143/74  Pulse:  97  Resp:  18  Temp:  98.4 F (36.9 C)  SpO2: 96% 95%    General: Awake, no distress.  CV:  Good peripheral perfusion.  Resp:  Normal effort.  Abd:  No distention.  Other:  Awake alert pleasant gentleman no acute distress.  Dried blood in and around the nose and an abrasion on the bridge of the nose.  Bony tenderness on the bridge of the nose only.  Extraocular movements intact, no proptosis, no other signs of head  injury, C-spine without evidence of injury no deformity or tenderness to palpation or in the T or L-spine.  Chest wall atraumatic, abdomen atraumatic, and ranging all extremities with full active range of motion no deformities or bony tenderness to palpation.   ED Results / Procedures / Treatments   Labs (all labs ordered are listed, but only abnormal results are displayed) Labs Reviewed  CBC WITH DIFFERENTIAL/PLATELET - Abnormal; Notable for the following components:      Result Value   RBC 3.73 (*)    Hemoglobin 11.6 (*)    HCT 35.8 (*)    Platelets 146 (*)    All other components within normal limits  BASIC METABOLIC PANEL WITH GFR - Abnormal; Notable for the following components:   Chloride 92 (*)    CO2 35 (*)    Glucose, Bld 134 (*)    All other components within normal limits  BRAIN NATRIURETIC PEPTIDE - Abnormal; Notable for the following components:   B Natriuretic  Peptide 137.6 (*)    All other components within normal limits     I ordered and reviewed the above labs they are notable for count and electrolytes unremarkable compared to prior.    RADIOLOGY I independently reviewed and interpreted CT of the head and see no obvious bleeding or midline shift I also reviewed radiologist's formal read.   PROCEDURES:  Critical Care performed: No  Procedures   MEDICATIONS ORDERED IN ED: Medications - No data to display   IMPRESSION / MDM / ASSESSMENT AND PLAN / ED COURSE  I reviewed the triage vital signs and the nursing notes.                                Patient's presentation is most consistent with acute presentation with potential threat to life or bodily function.  Differential diagnosis includes, but is not limited to, mechanical fall leading to blunt traumatic injury including facial fracture, nasal fracture, ICH, skull fracture, C-spine injury    MDM:    Mechanical slip and fall leading to blunt traumatic injury mostly involving the face.  Nasal  bone fracture noted on CT but fortunately CT of the head and neck shows no acute traumatic injuries.  Patient awake stable and comfortable despite his injuries.  Evaluation of the nose does not show any septal hematoma as suggested by CT scan.  I was able to remove several pieces of dried blood from inside of his nose and on my best visualization there were no hematomas to drain at this time.  Patient otherwise well without any acute complaints, no presyncopal symptoms.  I considered hospitalization for admission or observation however given unremarkable workup as above, ability to follow-up with ENT for his nasal bone fractures, and otherwise unremarkable imaging and trauma exam, I think he is appropriate for outpatient management.        FINAL CLINICAL IMPRESSION(S) / ED DIAGNOSES   Final diagnoses:  Fall, initial encounter  Abrasion of nose, initial encounter  Closed fracture of nasal bone, initial encounter     Rx / DC Orders   ED Discharge Orders     None        Note:  This document was prepared using Dragon voice recognition software and may include unintentional dictation errors.Cyrena Mylar, MD 06/18/24 [phone]

## 2024-06-17 NOTE — ED Triage Notes (Signed)
 First Nurse Note:  Pt via [facility] from the [employer]. Pt c/o fall today, pt feel forward. Laceration to the bridge of the nose. Denies LOC. Denies blood thinner. Pt is A&Ox4 and NAD 154/75 BP  68 HR

## 2024-06-18 DIAGNOSIS — Z7401 Bed confinement status: Secondary | ICD-10-CM | POA: Diagnosis not present

## 2024-06-18 DIAGNOSIS — R0902 Hypoxemia: Secondary | ICD-10-CM | POA: Diagnosis not present

## 2024-06-18 DIAGNOSIS — J9600 Acute respiratory failure, unspecified whether with hypoxia or hypercapnia: Secondary | ICD-10-CM | POA: Diagnosis not present

## 2024-06-18 NOTE — Discharge Instructions (Signed)
 You have a broken bone in your nose.  Please call Dr. Milissa for an appointment tomorrow for reexamination of your nose and discussion about treatment options for broken nose.  Take acetaminophen  650 mg and ibuprofen 400 mg every 6 hours as needed for pain.  Take with food. Apply ice to the bridge of your nose to help with pain and swelling.  Thank you for choosing us  for your health care today!  Please see your primary doctor this week for a follow up appointment.   If you have any new, worsening, or unexpected symptoms call your doctor right away or come back to the emergency department for reevaluation.  It was my pleasure to care for you today.   Ginnie EDISON Cyrena, MD

## 2024-06-21 DIAGNOSIS — R7309 Other abnormal glucose: Secondary | ICD-10-CM | POA: Diagnosis not present

## 2024-06-21 DIAGNOSIS — Z79899 Other long term (current) drug therapy: Secondary | ICD-10-CM | POA: Diagnosis not present

## 2024-06-21 DIAGNOSIS — I10 Essential (primary) hypertension: Secondary | ICD-10-CM | POA: Diagnosis not present

## 2024-06-21 DIAGNOSIS — J449 Chronic obstructive pulmonary disease, unspecified: Secondary | ICD-10-CM | POA: Diagnosis not present

## 2024-06-21 DIAGNOSIS — E782 Mixed hyperlipidemia: Secondary | ICD-10-CM | POA: Diagnosis not present

## 2024-06-21 DIAGNOSIS — I2510 Atherosclerotic heart disease of native coronary artery without angina pectoris: Secondary | ICD-10-CM | POA: Diagnosis not present

## 2024-06-21 DIAGNOSIS — E038 Other specified hypothyroidism: Secondary | ICD-10-CM | POA: Diagnosis not present

## 2024-06-21 DIAGNOSIS — D519 Vitamin B12 deficiency anemia, unspecified: Secondary | ICD-10-CM | POA: Diagnosis not present

## 2024-06-21 DIAGNOSIS — I480 Paroxysmal atrial fibrillation: Secondary | ICD-10-CM | POA: Diagnosis not present

## 2024-06-21 DIAGNOSIS — E559 Vitamin D deficiency, unspecified: Secondary | ICD-10-CM | POA: Diagnosis not present

## 2024-06-21 DIAGNOSIS — F319 Bipolar disorder, unspecified: Secondary | ICD-10-CM | POA: Diagnosis not present

## 2024-06-22 DIAGNOSIS — I2510 Atherosclerotic heart disease of native coronary artery without angina pectoris: Secondary | ICD-10-CM | POA: Diagnosis not present

## 2024-06-22 DIAGNOSIS — H409 Unspecified glaucoma: Secondary | ICD-10-CM | POA: Diagnosis not present

## 2024-06-22 DIAGNOSIS — I480 Paroxysmal atrial fibrillation: Secondary | ICD-10-CM | POA: Diagnosis not present

## 2024-06-22 DIAGNOSIS — R55 Syncope and collapse: Secondary | ICD-10-CM | POA: Diagnosis not present

## 2024-06-22 DIAGNOSIS — K219 Gastro-esophageal reflux disease without esophagitis: Secondary | ICD-10-CM | POA: Diagnosis not present

## 2024-06-22 DIAGNOSIS — J449 Chronic obstructive pulmonary disease, unspecified: Secondary | ICD-10-CM | POA: Diagnosis not present

## 2024-06-22 DIAGNOSIS — E785 Hyperlipidemia, unspecified: Secondary | ICD-10-CM | POA: Diagnosis not present

## 2024-06-22 DIAGNOSIS — S022XXD Fracture of nasal bones, subsequent encounter for fracture with routine healing: Secondary | ICD-10-CM | POA: Diagnosis not present

## 2024-06-22 DIAGNOSIS — R5381 Other malaise: Secondary | ICD-10-CM | POA: Diagnosis not present

## 2024-06-22 DIAGNOSIS — E559 Vitamin D deficiency, unspecified: Secondary | ICD-10-CM | POA: Diagnosis not present

## 2024-06-22 DIAGNOSIS — R296 Repeated falls: Secondary | ICD-10-CM | POA: Diagnosis not present

## 2024-06-23 DIAGNOSIS — Z993 Dependence on wheelchair: Secondary | ICD-10-CM | POA: Diagnosis not present

## 2024-06-24 DIAGNOSIS — F319 Bipolar disorder, unspecified: Secondary | ICD-10-CM | POA: Diagnosis not present

## 2024-06-25 DIAGNOSIS — R011 Cardiac murmur, unspecified: Secondary | ICD-10-CM | POA: Diagnosis not present

## 2024-06-25 DIAGNOSIS — R012 Other cardiac sounds: Secondary | ICD-10-CM | POA: Diagnosis not present

## 2024-06-28 DIAGNOSIS — G459 Transient cerebral ischemic attack, unspecified: Secondary | ICD-10-CM | POA: Diagnosis not present

## 2024-06-28 DIAGNOSIS — Z993 Dependence on wheelchair: Secondary | ICD-10-CM | POA: Diagnosis not present

## 2024-06-28 DIAGNOSIS — D519 Vitamin B12 deficiency anemia, unspecified: Secondary | ICD-10-CM | POA: Diagnosis not present

## 2024-06-28 DIAGNOSIS — I739 Peripheral vascular disease, unspecified: Secondary | ICD-10-CM | POA: Diagnosis not present

## 2024-06-28 DIAGNOSIS — R7309 Other abnormal glucose: Secondary | ICD-10-CM | POA: Diagnosis not present

## 2024-06-28 DIAGNOSIS — I10 Essential (primary) hypertension: Secondary | ICD-10-CM | POA: Diagnosis not present

## 2024-06-28 DIAGNOSIS — F319 Bipolar disorder, unspecified: Secondary | ICD-10-CM | POA: Diagnosis not present

## 2024-06-28 DIAGNOSIS — E782 Mixed hyperlipidemia: Secondary | ICD-10-CM | POA: Diagnosis not present

## 2024-06-28 DIAGNOSIS — Z79899 Other long term (current) drug therapy: Secondary | ICD-10-CM | POA: Diagnosis not present

## 2024-06-28 DIAGNOSIS — I480 Paroxysmal atrial fibrillation: Secondary | ICD-10-CM | POA: Diagnosis not present

## 2024-06-28 DIAGNOSIS — J441 Chronic obstructive pulmonary disease with (acute) exacerbation: Secondary | ICD-10-CM | POA: Diagnosis not present

## 2024-06-28 DIAGNOSIS — A419 Sepsis, unspecified organism: Secondary | ICD-10-CM | POA: Diagnosis not present

## 2024-06-28 DIAGNOSIS — F03B18 Unspecified dementia, moderate, with other behavioral disturbance: Secondary | ICD-10-CM | POA: Diagnosis not present

## 2024-06-29 DIAGNOSIS — G459 Transient cerebral ischemic attack, unspecified: Secondary | ICD-10-CM | POA: Diagnosis not present

## 2024-06-29 DIAGNOSIS — Z993 Dependence on wheelchair: Secondary | ICD-10-CM | POA: Diagnosis not present

## 2024-06-29 DIAGNOSIS — F03B18 Unspecified dementia, moderate, with other behavioral disturbance: Secondary | ICD-10-CM | POA: Diagnosis not present

## 2024-06-29 DIAGNOSIS — I10 Essential (primary) hypertension: Secondary | ICD-10-CM | POA: Diagnosis not present

## 2024-06-29 DIAGNOSIS — F319 Bipolar disorder, unspecified: Secondary | ICD-10-CM | POA: Diagnosis not present

## 2024-06-29 DIAGNOSIS — A419 Sepsis, unspecified organism: Secondary | ICD-10-CM | POA: Diagnosis not present

## 2024-06-29 DIAGNOSIS — J441 Chronic obstructive pulmonary disease with (acute) exacerbation: Secondary | ICD-10-CM | POA: Diagnosis not present

## 2024-06-29 DIAGNOSIS — I739 Peripheral vascular disease, unspecified: Secondary | ICD-10-CM | POA: Diagnosis not present

## 2024-06-29 DIAGNOSIS — I480 Paroxysmal atrial fibrillation: Secondary | ICD-10-CM | POA: Diagnosis not present

## 2024-06-30 DIAGNOSIS — J441 Chronic obstructive pulmonary disease with (acute) exacerbation: Secondary | ICD-10-CM | POA: Diagnosis not present

## 2024-07-05 DIAGNOSIS — Z993 Dependence on wheelchair: Secondary | ICD-10-CM | POA: Diagnosis not present

## 2024-07-05 DIAGNOSIS — I10 Essential (primary) hypertension: Secondary | ICD-10-CM | POA: Diagnosis not present

## 2024-07-05 DIAGNOSIS — G459 Transient cerebral ischemic attack, unspecified: Secondary | ICD-10-CM | POA: Diagnosis not present

## 2024-07-05 DIAGNOSIS — F319 Bipolar disorder, unspecified: Secondary | ICD-10-CM | POA: Diagnosis not present

## 2024-07-05 DIAGNOSIS — A419 Sepsis, unspecified organism: Secondary | ICD-10-CM | POA: Diagnosis not present

## 2024-07-05 DIAGNOSIS — F03B18 Unspecified dementia, moderate, with other behavioral disturbance: Secondary | ICD-10-CM | POA: Diagnosis not present

## 2024-07-05 DIAGNOSIS — I739 Peripheral vascular disease, unspecified: Secondary | ICD-10-CM | POA: Diagnosis not present

## 2024-07-05 DIAGNOSIS — J441 Chronic obstructive pulmonary disease with (acute) exacerbation: Secondary | ICD-10-CM | POA: Diagnosis not present

## 2024-07-06 DIAGNOSIS — J449 Chronic obstructive pulmonary disease, unspecified: Secondary | ICD-10-CM | POA: Diagnosis not present

## 2024-07-06 DIAGNOSIS — E785 Hyperlipidemia, unspecified: Secondary | ICD-10-CM | POA: Diagnosis not present

## 2024-07-06 DIAGNOSIS — I480 Paroxysmal atrial fibrillation: Secondary | ICD-10-CM | POA: Diagnosis not present

## 2024-07-06 DIAGNOSIS — S022XXD Fracture of nasal bones, subsequent encounter for fracture with routine healing: Secondary | ICD-10-CM | POA: Diagnosis not present

## 2024-07-06 DIAGNOSIS — R5381 Other malaise: Secondary | ICD-10-CM | POA: Diagnosis not present

## 2024-07-06 DIAGNOSIS — E559 Vitamin D deficiency, unspecified: Secondary | ICD-10-CM | POA: Diagnosis not present

## 2024-07-06 DIAGNOSIS — K219 Gastro-esophageal reflux disease without esophagitis: Secondary | ICD-10-CM | POA: Diagnosis not present

## 2024-07-06 DIAGNOSIS — I2510 Atherosclerotic heart disease of native coronary artery without angina pectoris: Secondary | ICD-10-CM | POA: Diagnosis not present

## 2024-07-06 DIAGNOSIS — H409 Unspecified glaucoma: Secondary | ICD-10-CM | POA: Diagnosis not present

## 2024-07-07 DIAGNOSIS — Z993 Dependence on wheelchair: Secondary | ICD-10-CM | POA: Diagnosis not present

## 2024-07-12 DIAGNOSIS — F319 Bipolar disorder, unspecified: Secondary | ICD-10-CM | POA: Diagnosis not present

## 2024-07-12 DIAGNOSIS — I480 Paroxysmal atrial fibrillation: Secondary | ICD-10-CM | POA: Diagnosis not present

## 2024-07-12 DIAGNOSIS — G459 Transient cerebral ischemic attack, unspecified: Secondary | ICD-10-CM | POA: Diagnosis not present

## 2024-07-12 DIAGNOSIS — I739 Peripheral vascular disease, unspecified: Secondary | ICD-10-CM | POA: Diagnosis not present

## 2024-07-12 DIAGNOSIS — I10 Essential (primary) hypertension: Secondary | ICD-10-CM | POA: Diagnosis not present

## 2024-07-12 DIAGNOSIS — J441 Chronic obstructive pulmonary disease with (acute) exacerbation: Secondary | ICD-10-CM | POA: Diagnosis not present

## 2024-07-12 DIAGNOSIS — Z993 Dependence on wheelchair: Secondary | ICD-10-CM | POA: Diagnosis not present

## 2024-07-12 DIAGNOSIS — F03B18 Unspecified dementia, moderate, with other behavioral disturbance: Secondary | ICD-10-CM | POA: Diagnosis not present

## 2024-07-15 DIAGNOSIS — Z993 Dependence on wheelchair: Secondary | ICD-10-CM | POA: Diagnosis not present

## 2024-07-15 DIAGNOSIS — F319 Bipolar disorder, unspecified: Secondary | ICD-10-CM | POA: Diagnosis not present

## 2024-07-15 DIAGNOSIS — F03B18 Unspecified dementia, moderate, with other behavioral disturbance: Secondary | ICD-10-CM | POA: Diagnosis not present

## 2024-07-15 DIAGNOSIS — I480 Paroxysmal atrial fibrillation: Secondary | ICD-10-CM | POA: Diagnosis not present

## 2024-07-15 DIAGNOSIS — I739 Peripheral vascular disease, unspecified: Secondary | ICD-10-CM | POA: Diagnosis not present

## 2024-07-15 DIAGNOSIS — G459 Transient cerebral ischemic attack, unspecified: Secondary | ICD-10-CM | POA: Diagnosis not present

## 2024-07-15 DIAGNOSIS — J441 Chronic obstructive pulmonary disease with (acute) exacerbation: Secondary | ICD-10-CM | POA: Diagnosis not present

## 2024-07-15 DIAGNOSIS — I10 Essential (primary) hypertension: Secondary | ICD-10-CM | POA: Diagnosis not present

## 2024-07-15 DIAGNOSIS — A419 Sepsis, unspecified organism: Secondary | ICD-10-CM | POA: Diagnosis not present

## 2024-07-20 DIAGNOSIS — I480 Paroxysmal atrial fibrillation: Secondary | ICD-10-CM | POA: Diagnosis not present

## 2024-07-20 DIAGNOSIS — J449 Chronic obstructive pulmonary disease, unspecified: Secondary | ICD-10-CM | POA: Diagnosis not present

## 2024-07-20 DIAGNOSIS — E038 Other specified hypothyroidism: Secondary | ICD-10-CM | POA: Diagnosis not present

## 2024-07-20 DIAGNOSIS — D519 Vitamin B12 deficiency anemia, unspecified: Secondary | ICD-10-CM | POA: Diagnosis not present

## 2024-07-20 DIAGNOSIS — E559 Vitamin D deficiency, unspecified: Secondary | ICD-10-CM | POA: Diagnosis not present

## 2024-07-20 DIAGNOSIS — E782 Mixed hyperlipidemia: Secondary | ICD-10-CM | POA: Diagnosis not present

## 2024-07-20 DIAGNOSIS — F319 Bipolar disorder, unspecified: Secondary | ICD-10-CM | POA: Diagnosis not present

## 2024-07-20 DIAGNOSIS — I2510 Atherosclerotic heart disease of native coronary artery without angina pectoris: Secondary | ICD-10-CM | POA: Diagnosis not present

## 2024-07-20 DIAGNOSIS — I10 Essential (primary) hypertension: Secondary | ICD-10-CM | POA: Diagnosis not present

## 2024-07-22 DIAGNOSIS — I2510 Atherosclerotic heart disease of native coronary artery without angina pectoris: Secondary | ICD-10-CM | POA: Diagnosis not present

## 2024-07-22 DIAGNOSIS — E559 Vitamin D deficiency, unspecified: Secondary | ICD-10-CM | POA: Diagnosis not present

## 2024-07-22 DIAGNOSIS — K219 Gastro-esophageal reflux disease without esophagitis: Secondary | ICD-10-CM | POA: Diagnosis not present

## 2024-07-22 DIAGNOSIS — I480 Paroxysmal atrial fibrillation: Secondary | ICD-10-CM | POA: Diagnosis not present

## 2024-07-22 DIAGNOSIS — J441 Chronic obstructive pulmonary disease with (acute) exacerbation: Secondary | ICD-10-CM | POA: Diagnosis not present

## 2024-07-22 DIAGNOSIS — G459 Transient cerebral ischemic attack, unspecified: Secondary | ICD-10-CM | POA: Diagnosis not present

## 2024-07-22 DIAGNOSIS — A419 Sepsis, unspecified organism: Secondary | ICD-10-CM | POA: Diagnosis not present

## 2024-07-22 DIAGNOSIS — F319 Bipolar disorder, unspecified: Secondary | ICD-10-CM | POA: Diagnosis not present

## 2024-07-22 DIAGNOSIS — Z993 Dependence on wheelchair: Secondary | ICD-10-CM | POA: Diagnosis not present

## 2024-07-22 DIAGNOSIS — R5381 Other malaise: Secondary | ICD-10-CM | POA: Diagnosis not present

## 2024-07-22 DIAGNOSIS — I10 Essential (primary) hypertension: Secondary | ICD-10-CM | POA: Diagnosis not present

## 2024-07-22 DIAGNOSIS — J449 Chronic obstructive pulmonary disease, unspecified: Secondary | ICD-10-CM | POA: Diagnosis not present

## 2024-07-22 DIAGNOSIS — I739 Peripheral vascular disease, unspecified: Secondary | ICD-10-CM | POA: Diagnosis not present

## 2024-07-22 DIAGNOSIS — F03B18 Unspecified dementia, moderate, with other behavioral disturbance: Secondary | ICD-10-CM | POA: Diagnosis not present

## 2024-07-22 DIAGNOSIS — H409 Unspecified glaucoma: Secondary | ICD-10-CM | POA: Diagnosis not present

## 2024-07-22 DIAGNOSIS — G4733 Obstructive sleep apnea (adult) (pediatric): Secondary | ICD-10-CM | POA: Diagnosis not present

## 2024-07-27 DIAGNOSIS — Z993 Dependence on wheelchair: Secondary | ICD-10-CM | POA: Diagnosis not present

## 2024-07-27 DIAGNOSIS — I480 Paroxysmal atrial fibrillation: Secondary | ICD-10-CM | POA: Diagnosis not present

## 2024-07-27 DIAGNOSIS — G459 Transient cerebral ischemic attack, unspecified: Secondary | ICD-10-CM | POA: Diagnosis not present

## 2024-07-27 DIAGNOSIS — F319 Bipolar disorder, unspecified: Secondary | ICD-10-CM | POA: Diagnosis not present

## 2024-07-27 DIAGNOSIS — I739 Peripheral vascular disease, unspecified: Secondary | ICD-10-CM | POA: Diagnosis not present

## 2024-07-27 DIAGNOSIS — J441 Chronic obstructive pulmonary disease with (acute) exacerbation: Secondary | ICD-10-CM | POA: Diagnosis not present

## 2024-07-27 DIAGNOSIS — F03B18 Unspecified dementia, moderate, with other behavioral disturbance: Secondary | ICD-10-CM | POA: Diagnosis not present

## 2024-07-27 DIAGNOSIS — A419 Sepsis, unspecified organism: Secondary | ICD-10-CM | POA: Diagnosis not present

## 2024-07-27 DIAGNOSIS — I10 Essential (primary) hypertension: Secondary | ICD-10-CM | POA: Diagnosis not present

## 2024-08-18 DIAGNOSIS — R399 Unspecified symptoms and signs involving the genitourinary system: Secondary | ICD-10-CM | POA: Diagnosis not present

## 2024-08-31 ENCOUNTER — Inpatient Hospital Stay

## 2024-09-07 ENCOUNTER — Inpatient Hospital Stay

## 2024-09-08 ENCOUNTER — Ambulatory Visit: Admitting: Radiation Oncology

## 2024-09-08 DIAGNOSIS — Z125 Encounter for screening for malignant neoplasm of prostate: Secondary | ICD-10-CM | POA: Diagnosis not present

## 2024-09-08 DIAGNOSIS — J9610 Chronic respiratory failure, unspecified whether with hypoxia or hypercapnia: Secondary | ICD-10-CM | POA: Diagnosis not present

## 2024-09-08 DIAGNOSIS — R739 Hyperglycemia, unspecified: Secondary | ICD-10-CM | POA: Diagnosis not present

## 2024-09-08 DIAGNOSIS — C61 Malignant neoplasm of prostate: Secondary | ICD-10-CM | POA: Diagnosis not present

## 2024-09-08 DIAGNOSIS — Z79899 Other long term (current) drug therapy: Secondary | ICD-10-CM | POA: Diagnosis not present

## 2024-09-08 DIAGNOSIS — G309 Alzheimer's disease, unspecified: Secondary | ICD-10-CM | POA: Diagnosis not present

## 2024-09-08 DIAGNOSIS — F0670 Mild neurocognitive disorder due to known physiological condition without behavioral disturbance: Secondary | ICD-10-CM | POA: Diagnosis not present

## 2024-09-08 DIAGNOSIS — Z0000 Encounter for general adult medical examination without abnormal findings: Secondary | ICD-10-CM | POA: Diagnosis not present

## 2024-09-08 DIAGNOSIS — Z1331 Encounter for screening for depression: Secondary | ICD-10-CM | POA: Diagnosis not present

## 2024-09-13 ENCOUNTER — Ambulatory Visit: Admitting: Radiation Oncology

## 2024-09-16 ENCOUNTER — Ambulatory Visit: Admitting: Radiation Oncology
